# Patient Record
Sex: Female | Born: 1995 | Race: Black or African American | Hispanic: No | Marital: Single | Smoking: Never smoker
Health system: Southern US, Community
[De-identification: ages and names within clinical notes are randomized; demographics above are authoritative.]

## PROBLEM LIST (undated history)

## (undated) DIAGNOSIS — D649 Anemia, unspecified: Secondary | ICD-10-CM

## (undated) DIAGNOSIS — E282 Polycystic ovarian syndrome: Secondary | ICD-10-CM

## (undated) DIAGNOSIS — E559 Vitamin D deficiency, unspecified: Secondary | ICD-10-CM

## (undated) DIAGNOSIS — R7303 Prediabetes: Secondary | ICD-10-CM

## (undated) DIAGNOSIS — F32A Depression, unspecified: Secondary | ICD-10-CM

## (undated) DIAGNOSIS — F329 Major depressive disorder, single episode, unspecified: Secondary | ICD-10-CM

## (undated) DIAGNOSIS — F419 Anxiety disorder, unspecified: Secondary | ICD-10-CM

## (undated) DIAGNOSIS — G4730 Sleep apnea, unspecified: Secondary | ICD-10-CM

## (undated) DIAGNOSIS — R079 Chest pain, unspecified: Secondary | ICD-10-CM

## (undated) DIAGNOSIS — N803 Endometriosis of pelvic peritoneum: Secondary | ICD-10-CM

## (undated) DIAGNOSIS — Z8041 Family history of malignant neoplasm of ovary: Secondary | ICD-10-CM

## (undated) DIAGNOSIS — K5900 Constipation, unspecified: Secondary | ICD-10-CM

## (undated) DIAGNOSIS — IMO0002 Reserved for concepts with insufficient information to code with codable children: Secondary | ICD-10-CM

## (undated) DIAGNOSIS — Z803 Family history of malignant neoplasm of breast: Secondary | ICD-10-CM

## (undated) DIAGNOSIS — Z8042 Family history of malignant neoplasm of prostate: Secondary | ICD-10-CM

## (undated) DIAGNOSIS — L732 Hidradenitis suppurativa: Secondary | ICD-10-CM

## (undated) DIAGNOSIS — Z800 Family history of malignant neoplasm of digestive organs: Secondary | ICD-10-CM

## (undated) HISTORY — DX: Family history of malignant neoplasm of digestive organs: Z80.0

## (undated) HISTORY — DX: Prediabetes: R73.03

## (undated) HISTORY — DX: Anemia, unspecified: D64.9

## (undated) HISTORY — DX: Vitamin D deficiency, unspecified: E55.9

## (undated) HISTORY — DX: Polycystic ovarian syndrome: E28.2

## (undated) HISTORY — DX: Constipation, unspecified: K59.00

## (undated) HISTORY — DX: Family history of malignant neoplasm of ovary: Z80.41

## (undated) HISTORY — DX: Hidradenitis suppurativa: L73.2

## (undated) HISTORY — PX: WISDOM TOOTH EXTRACTION: SHX21

## (undated) HISTORY — PX: NO PAST SURGERIES: SHX2092

## (undated) HISTORY — DX: Chest pain, unspecified: R07.9

## (undated) HISTORY — PX: CERVICAL POLYPECTOMY: SHX88

## (undated) HISTORY — DX: Family history of malignant neoplasm of breast: Z80.3

## (undated) HISTORY — DX: Family history of malignant neoplasm of prostate: Z80.42

---
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
## 2016-02-18 ENCOUNTER — Ambulatory Visit: Payer: BLUE CROSS/BLUE SHIELD | Admitting: Nurse Practitioner

## 2016-03-27 ENCOUNTER — Encounter: Payer: Self-pay | Admitting: Internal Medicine

## 2016-03-27 ENCOUNTER — Ambulatory Visit (INDEPENDENT_AMBULATORY_CARE_PROVIDER_SITE_OTHER): Payer: BLUE CROSS/BLUE SHIELD | Admitting: Internal Medicine

## 2016-03-27 ENCOUNTER — Other Ambulatory Visit (INDEPENDENT_AMBULATORY_CARE_PROVIDER_SITE_OTHER): Payer: BLUE CROSS/BLUE SHIELD

## 2016-03-27 ENCOUNTER — Other Ambulatory Visit: Payer: Self-pay | Admitting: Obstetrics & Gynecology

## 2016-03-27 DIAGNOSIS — N943 Premenstrual tension syndrome: Secondary | ICD-10-CM | POA: Diagnosis not present

## 2016-03-27 DIAGNOSIS — Z23 Encounter for immunization: Secondary | ICD-10-CM | POA: Diagnosis not present

## 2016-03-27 DIAGNOSIS — E669 Obesity, unspecified: Secondary | ICD-10-CM | POA: Diagnosis not present

## 2016-03-27 DIAGNOSIS — R5383 Other fatigue: Secondary | ICD-10-CM | POA: Diagnosis not present

## 2016-03-27 DIAGNOSIS — Z6839 Body mass index (BMI) 39.0-39.9, adult: Secondary | ICD-10-CM

## 2016-03-27 LAB — CBC WITH DIFFERENTIAL/PLATELET
BASOS PCT: 0.6 % (ref 0.0–3.0)
Basophils Absolute: 0 10*3/uL (ref 0.0–0.1)
EOS ABS: 0.3 10*3/uL (ref 0.0–0.7)
Eosinophils Relative: 3.9 % (ref 0.0–5.0)
HEMATOCRIT: 32.8 % — AB (ref 36.0–49.0)
HEMOGLOBIN: 10.8 g/dL — AB (ref 12.0–16.0)
LYMPHS PCT: 25.7 % (ref 24.0–48.0)
Lymphs Abs: 2.2 10*3/uL (ref 0.7–4.0)
MCHC: 32.8 g/dL (ref 31.0–37.0)
MCV: 74.3 fl — AB (ref 78.0–98.0)
MONOS PCT: 8.6 % (ref 3.0–12.0)
Monocytes Absolute: 0.7 10*3/uL (ref 0.1–1.0)
NEUTROS ABS: 5.3 10*3/uL (ref 1.4–7.7)
Neutrophils Relative %: 61.2 % (ref 43.0–71.0)
PLATELETS: 344 10*3/uL (ref 150.0–575.0)
RBC: 4.41 Mil/uL (ref 3.80–5.70)
RDW: 17.2 % — AB (ref 11.4–15.5)
WBC: 8.7 10*3/uL (ref 4.5–13.5)

## 2016-03-27 LAB — BASIC METABOLIC PANEL
BUN: 8 mg/dL (ref 6–23)
CHLORIDE: 105 meq/L (ref 96–112)
CO2: 27 meq/L (ref 19–32)
CREATININE: 0.57 mg/dL (ref 0.40–1.20)
Calcium: 9.5 mg/dL (ref 8.4–10.5)
GFR: 174.51 mL/min (ref >60.00)
Glucose, Bld: 85 mg/dL (ref 70–99)
POTASSIUM: 3.8 meq/L (ref 3.5–5.1)
SODIUM: 138 meq/L (ref 135–145)

## 2016-03-27 LAB — URINALYSIS
Bilirubin Urine: NEGATIVE
Hgb urine dipstick: NEGATIVE
KETONES UR: NEGATIVE
Leukocytes, UA: NEGATIVE
Nitrite: NEGATIVE
SPECIFIC GRAVITY, URINE: 1.01 (ref 1.000–1.030)
TOTAL PROTEIN, URINE-UPE24: NEGATIVE
URINE GLUCOSE: NEGATIVE
UROBILINOGEN UA: 0.2 (ref 0.0–1.0)
pH: 7 (ref 5.0–8.0)

## 2016-03-27 LAB — HEPATIC FUNCTION PANEL
ALBUMIN: 3.9 g/dL (ref 3.5–5.2)
ALK PHOS: 66 U/L (ref 47–119)
ALT: 12 U/L (ref 0–35)
AST: 12 U/L (ref 0–37)
BILIRUBIN DIRECT: 0 mg/dL (ref 0.0–0.3)
TOTAL PROTEIN: 7.3 g/dL (ref 6.0–8.3)
Total Bilirubin: 0.4 mg/dL (ref 0.2–1.2)

## 2016-03-27 LAB — TSH: TSH: 2.57 u[IU]/mL (ref 0.40–5.00)

## 2016-03-27 LAB — VITAMIN B12: Vitamin B-12: 415 pg/mL (ref 211–911)

## 2016-03-27 LAB — MONONUCLEOSIS SCREEN: Mono Screen: NEGATIVE

## 2016-03-27 NOTE — Progress Notes (Signed)
Pre visit review using our clinic review tool, if applicable. No additional management support is needed unless otherwise documented below in the visit note. 

## 2016-03-27 NOTE — Assessment & Plan Note (Signed)
On Zoloft. °

## 2016-03-27 NOTE — Assessment & Plan Note (Addendum)
10/17 ?etiology Labs Increase activity, loose wt, power nap

## 2016-03-27 NOTE — Patient Instructions (Signed)
Take a power nap

## 2016-03-27 NOTE — Progress Notes (Signed)
Subjective:  Patient ID: Kendra Allen, female    DOB: August 04, 1995  Age: 20 y.o. MRN: [id number]  CC: No chief complaint on file.   HPI Kendra Allen presents for fatigue 1 month. She has to take 1-2 naps a day.  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 Periods are not heavy.   Outpatient Medications Prior to Visit  Medication Sig Dispense Refill  . cholecalciferol (VITAMIN D) 1000 units tablet Take 1 tablet (1,000 Units total) by mouth daily. 100 tablet 3  . doxycycline (DORYX) 150 MG EC tablet daily.    . hydrOXYzine (VISTARIL) 25 MG capsule TAKE ONE CAPSULE BY MOUTH EVERY 6 HOURS AS NEEDED FOR ANXIETY  1  . sertraline (ZOLOFT) 100 MG tablet TAKE 1 TABLET BY MOUTH EVERY DAY (Patient taking differently: TAKE 1 1/2 TABLET BY MOUTH EVERY DAY) 90 tablet 1   No facility-administered medications prior to visit.     ROS Review of Systems  Constitutional: Negative for activity change, appetite change, chills, fatigue and unexpected weight change.  HENT: Negative for congestion, mouth sores and sinus pressure.   Eyes: Negative for visual disturbance.  Respiratory: Negative for cough and chest tightness.   Gastrointestinal: Negative for abdominal pain and nausea.  Genitourinary: Negative for difficulty urinating, frequency and vaginal pain.  Musculoskeletal: Negative for back pain and gait problem.  Skin: Negative for pallor and rash.  Neurological: Negative for dizziness, tremors, weakness, numbness and headaches.  Psychiatric/Behavioral: Negative for confusion, sleep disturbance and suicidal ideas.    Objective:  BP 110/70   Pulse 76   Temp 98.8 F (37.1 C) (Oral)   Wt 246 lb (111.6 kg)   SpO2 98%   BMI 39.71 kg/m   BP Readings from Last 3 Encounters:  03/27/16 110/70  11/26/15 110/78  06/16/15 108/74    Wt Readings from Last 3 Encounters:  03/27/16 246 lb (111.6 kg) (>99 %, Z > 2.33)*  11/26/15 242 lb (109.8 kg) (>99 %,  Z > 2.33)*  06/16/15 241 lb (109.3 kg) (>99 %, Z > 2.33)*   * Growth percentiles are based on CDC 2-20 Years data.    Physical Exam  Constitutional: She appears well-developed. No distress.  HENT:  Head: Normocephalic.  Right Ear: External ear normal.  Left Ear: External ear normal.  Nose: Nose normal.  Mouth/Throat: Oropharynx is clear and moist.  Eyes: Conjunctivae are normal. Pupils are equal, round, and reactive to light. Right eye exhibits no discharge. Left eye exhibits no discharge.  Neck: Normal range of motion. Neck supple. No JVD present. No tracheal deviation present. No thyromegaly present.  Cardiovascular: Normal rate, regular rhythm and normal heart sounds.   Pulmonary/Chest: No stridor. No respiratory distress. She has no wheezes.  Abdominal: Soft. Bowel sounds are normal. She exhibits no distension and no mass. There is no tenderness. There is no rebound and no guarding.  Musculoskeletal: She exhibits no edema or tenderness.  Lymphadenopathy:    She has no cervical adenopathy.  Neurological: She displays normal reflexes. No cranial nerve deficit. She exhibits normal muscle tone. Coordination normal.  Skin: No rash noted. No erythema.  Psychiatric: She has a normal mood and affect. Her behavior is normal. Judgment and thought content normal.  Obese  Lab Results  Component Value Date   WBC 7.2 08/25/2014   HGB 11.9 (L) 08/25/2014   HCT 35.3 (L) 08/25/2014   PLT 340.0 08/25/2014   GLUCOSE 86 08/25/2014  ALT 16 08/25/2014   AST 15 08/25/2014   NA 137 08/25/2014   K 4.1 08/25/2014   CL 103 08/25/2014   CREATININE 0.60 08/25/2014   BUN 9 08/25/2014   CO2 30 08/25/2014   TSH 2.28 08/25/2014    Dg Chest 2 View  Result Date: 11/26/2015 CLINICAL DATA:  Mid chest pain and chest pressure sensation for the past 3 weeks; no known cardiopulmonary disease; nonsmoker. EXAM: CHEST  2 VIEW COMPARISON:  None in PACs FINDINGS: The lungs are mildly hypoinflated on the frontal  image but better inflated on the lateral image. The interstitial markings are coarse in the mid and lower lungs. There is no alveolar infiltrate. There is no pleural effusion, pneumothorax, or pneumomediastinum. The heart and pulmonary vascularity are normal. The mediastinum is normal in width. The trachea is midline. The bony thorax exhibits no acute abnormality. IMPRESSION: Mild interstitial prominence may reflect bronchitic change. There is no alveolar pneumonia nor CHF. Electronically Signed   By: David  Martinique M.D.   On: 11/26/2015 14:34    Assessment & Plan:   There are no diagnoses linked to this encounter. I am having Ms. Morocho maintain her sertraline, doxycycline, hydrOXYzine, cholecalciferol, and clindamycin.  Meds ordered this encounter  Medications  . clindamycin (CLEOCIN T) 1 % external solution    Sig: as needed.     Follow-up: No Follow-up on file.  Walker Kehr, MD

## 2016-03-27 NOTE — Assessment & Plan Note (Signed)
Labs

## 2016-03-27 NOTE — Addendum Note (Signed)
Addended by: Cresenciano Lick on: 03/27/2016 03:26 PM   Modules accepted: Orders

## 2016-03-28 ENCOUNTER — Other Ambulatory Visit: Payer: Self-pay | Admitting: Internal Medicine

## 2016-03-28 MED ORDER — FERROUS SULFATE 325 (65 FE) MG PO TABS: 325.0000 mg | ORAL_TABLET | Freq: Every day | ORAL | 6 refills | Status: DC

## 2016-04-07 ENCOUNTER — Other Ambulatory Visit: Payer: Self-pay | Admitting: Obstetrics & Gynecology

## 2016-04-07 DIAGNOSIS — N63 Unspecified lump in unspecified breast: Secondary | ICD-10-CM

## 2016-05-10 ENCOUNTER — Other Ambulatory Visit: Payer: BLUE CROSS/BLUE SHIELD

## 2016-10-25 ENCOUNTER — Ambulatory Visit: Payer: BLUE CROSS/BLUE SHIELD | Admitting: Internal Medicine

## 2016-10-27 ENCOUNTER — Ambulatory Visit (INDEPENDENT_AMBULATORY_CARE_PROVIDER_SITE_OTHER): Payer: BLUE CROSS/BLUE SHIELD | Admitting: Internal Medicine

## 2016-10-27 ENCOUNTER — Encounter: Payer: Self-pay | Admitting: Internal Medicine

## 2016-10-27 DIAGNOSIS — F419 Anxiety disorder, unspecified: Secondary | ICD-10-CM | POA: Diagnosis not present

## 2016-10-27 DIAGNOSIS — Z6839 Body mass index (BMI) 39.0-39.9, adult: Secondary | ICD-10-CM

## 2016-10-27 DIAGNOSIS — F329 Major depressive disorder, single episode, unspecified: Secondary | ICD-10-CM | POA: Diagnosis not present

## 2016-10-27 DIAGNOSIS — E669 Obesity, unspecified: Secondary | ICD-10-CM | POA: Diagnosis not present

## 2016-10-27 DIAGNOSIS — F32A Depression, unspecified: Secondary | ICD-10-CM

## 2016-10-27 NOTE — Assessment & Plan Note (Signed)
Recent labs - OK in 2017 Will ref to Dr Leafy Ro

## 2016-10-27 NOTE — Assessment & Plan Note (Signed)
Better Cont w/Zoloft

## 2016-10-27 NOTE — Progress Notes (Signed)
Subjective:  Patient ID: Kendra Allen, female    DOB: 1995-07-01  Age: 21 y.o. MRN: [id number]  CC: No chief complaint on file.   HPI RYLIN SAEZ presents for depression. C/o wt gain and obesity  Outpatient Medications Prior to Visit  Medication Sig Dispense Refill  . cholecalciferol (VITAMIN D) 1000 units tablet Take 1 tablet (1,000 Units total) by mouth daily. 100 tablet 3  . clindamycin (CLEOCIN T) 1 % external solution as needed.    . ferrous sulfate 325 (65 FE) MG tablet Take 1 tablet (325 mg total) by mouth daily. 30 tablet 6  . hydrOXYzine (VISTARIL) 25 MG capsule TAKE ONE CAPSULE BY MOUTH EVERY 6 HOURS AS NEEDED FOR ANXIETY  1  . sertraline (ZOLOFT) 100 MG tablet TAKE 1 TABLET BY MOUTH EVERY DAY (Patient taking differently: TAKE 1 1/2 TABLET BY MOUTH EVERY DAY) 90 tablet 1   No facility-administered medications prior to visit.     ROS Review of Systems  Constitutional: Positive for unexpected weight change. Negative for activity change, appetite change, chills and fatigue.  HENT: Negative for congestion, mouth sores and sinus pressure.   Eyes: Negative for visual disturbance.  Respiratory: Negative for cough and chest tightness.   Gastrointestinal: Negative for abdominal pain and nausea.  Genitourinary: Negative for difficulty urinating, frequency and vaginal pain.  Musculoskeletal: Negative for back pain and gait problem.  Skin: Negative for pallor and rash.  Neurological: Negative for dizziness, tremors, weakness, numbness and headaches.  Psychiatric/Behavioral: Negative for confusion and sleep disturbance.    Objective:  BP 110/76 (BP Location: Right Arm, Patient Position: Sitting, Cuff Size: Large)   Pulse 82   Temp 98.7 F (37.1 C) (Oral)   Ht 5\' 6"  (1.676 m)   Wt 254 lb 1.3 oz (115.2 kg)   SpO2 99%   BMI 41.01 kg/m   BP Readings from Last 3 Encounters:  10/27/16 110/76  03/27/16 110/70  11/26/15 110/78    Wt Readings from Last 3 Encounters:    10/27/16 254 lb 1.3 oz (115.2 kg)  03/27/16 246 lb (111.6 kg) (>99 %, Z= 2.47)*  11/26/15 242 lb (109.8 kg) (>99 %, Z= 2.42)*   * Growth percentiles are based on CDC 2-20 Years data.    Physical Exam  Constitutional: She appears well-developed. No distress.  HENT:  Head: Normocephalic.  Right Ear: External ear normal.  Left Ear: External ear normal.  Nose: Nose normal.  Mouth/Throat: Oropharynx is clear and moist.  Eyes: Conjunctivae are normal. Pupils are equal, round, and reactive to light. Right eye exhibits no discharge. Left eye exhibits no discharge.  Neck: Normal range of motion. Neck supple. No JVD present. No tracheal deviation present. No thyromegaly present.  Cardiovascular: Normal rate, regular rhythm and normal heart sounds.   Pulmonary/Chest: No stridor. No respiratory distress. She has no wheezes.  Abdominal: Soft. Bowel sounds are normal. She exhibits no distension and no mass. There is no tenderness. There is no rebound and no guarding.  Musculoskeletal: She exhibits no edema or tenderness.  Lymphadenopathy:    She has no cervical adenopathy.  Neurological: She displays normal reflexes. No cranial nerve deficit. She exhibits normal muscle tone. Coordination normal.  Skin: No rash noted. No erythema.  Psychiatric: She has a normal mood and affect. Her behavior is normal. Judgment and thought content normal.  Obese  Lab Results  Component Value Date   WBC 8.7 03/27/2016   HGB 10.8 (L) 03/27/2016   HCT 32.8 (L)  03/27/2016   PLT 344.0 03/27/2016   GLUCOSE 85 03/27/2016   ALT 12 03/27/2016   AST 12 03/27/2016   NA 138 03/27/2016   K 3.8 03/27/2016   CL 105 03/27/2016   CREATININE 0.57 03/27/2016   BUN 8 03/27/2016   CO2 27 03/27/2016   TSH 2.57 03/27/2016    Dg Chest 2 View  Result Date: 11/26/2015 CLINICAL DATA:  Mid chest pain and chest pressure sensation for the past 3 weeks; no known cardiopulmonary disease; nonsmoker. EXAM: CHEST  2 VIEW COMPARISON:   None in PACs FINDINGS: The lungs are mildly hypoinflated on the frontal image but better inflated on the lateral image. The interstitial markings are coarse in the mid and lower lungs. There is no alveolar infiltrate. There is no pleural effusion, pneumothorax, or pneumomediastinum. The heart and pulmonary vascularity are normal. The mediastinum is normal in width. The trachea is midline. The bony thorax exhibits no acute abnormality. IMPRESSION: Mild interstitial prominence may reflect bronchitic change. There is no alveolar pneumonia nor CHF. Electronically Signed   By: David  Martinique M.D.   On: 11/26/2015 14:34    Assessment & Plan:   There are no diagnoses linked to this encounter. I am having Ms. Feider maintain her sertraline, hydrOXYzine, cholecalciferol, clindamycin, and ferrous sulfate.  No orders of the defined types were placed in this encounter.    Follow-up: No Follow-up on file.  Walker Kehr, MD

## 2016-11-07 ENCOUNTER — Telehealth: Payer: Self-pay | Admitting: Internal Medicine

## 2016-11-07 NOTE — Telephone Encounter (Signed)
No, sorry. This is the only one - ?cancellation list Thx

## 2016-11-07 NOTE — Telephone Encounter (Signed)
States patient was referred for weight management.  Soonest patient could get in was July.  Patient was wanting to get in sooner.  Would like to know if there is a facility that can get patient in sooner.     States patient has been to OBGYN twice.  States patient is having a DNC and polyp removed soon.

## 2016-11-07 NOTE — Telephone Encounter (Signed)
Cecille Rubin- Do you know of anywhere where patient might be able to be seen sooner?  [place]-FYI

## 2016-11-09 NOTE — Telephone Encounter (Signed)
LM notifying pt

## 2016-11-15 ENCOUNTER — Other Ambulatory Visit: Payer: Self-pay | Admitting: Obstetrics and Gynecology

## 2016-11-20 ENCOUNTER — Encounter (HOSPITAL_COMMUNITY): Payer: Self-pay

## 2016-11-21 ENCOUNTER — Ambulatory Visit (HOSPITAL_COMMUNITY): Payer: BLUE CROSS/BLUE SHIELD | Admitting: Certified Registered Nurse Anesthetist

## 2016-11-21 ENCOUNTER — Ambulatory Visit (HOSPITAL_COMMUNITY)
Admission: RE | Admit: 2016-11-21 | Discharge: 2016-11-21 | Disposition: A | Discharge status: Home / Self Care | Payer: BLUE CROSS/BLUE SHIELD | Source: Ambulatory Visit | Attending: Obstetrics and Gynecology | Admitting: Obstetrics and Gynecology

## 2016-11-21 ENCOUNTER — Encounter (HOSPITAL_COMMUNITY)
Admission: RE | Disposition: A | Discharge status: Home / Self Care | Payer: Self-pay | Source: Ambulatory Visit | Attending: Obstetrics and Gynecology

## 2016-11-21 ENCOUNTER — Encounter (HOSPITAL_COMMUNITY): Payer: Self-pay | Admitting: *Deleted

## 2016-11-21 DIAGNOSIS — F329 Major depressive disorder, single episode, unspecified: Secondary | ICD-10-CM | POA: Diagnosis not present

## 2016-11-21 DIAGNOSIS — Z882 Allergy status to sulfonamides status: Secondary | ICD-10-CM | POA: Insufficient documentation

## 2016-11-21 DIAGNOSIS — F419 Anxiety disorder, unspecified: Secondary | ICD-10-CM | POA: Insufficient documentation

## 2016-11-21 DIAGNOSIS — N840 Polyp of corpus uteri: Secondary | ICD-10-CM | POA: Insufficient documentation

## 2016-11-21 DIAGNOSIS — N938 Other specified abnormal uterine and vaginal bleeding: Secondary | ICD-10-CM | POA: Insufficient documentation

## 2016-11-21 DIAGNOSIS — Z79899 Other long term (current) drug therapy: Secondary | ICD-10-CM | POA: Insufficient documentation

## 2016-11-21 HISTORY — PX: DILATATION & CURETTAGE/HYSTEROSCOPY WITH MYOSURE: SHX6511

## 2016-11-21 HISTORY — DX: Anxiety disorder, unspecified: F41.9

## 2016-11-21 HISTORY — DX: Depression, unspecified: F32.A

## 2016-11-21 HISTORY — DX: Major depressive disorder, single episode, unspecified: F32.9

## 2016-11-21 LAB — CBC
HEMATOCRIT: 31.9 % — AB (ref 36.0–46.0)
Hemoglobin: 10 g/dL — ABNORMAL LOW (ref 12.0–15.0)
MCH: 22.5 pg — AB (ref 26.0–34.0)
MCHC: 31.3 g/dL (ref 30.0–36.0)
MCV: 71.7 fL — AB (ref 78.0–100.0)
Platelets: 389 10*3/uL (ref 150–400)
RBC: 4.45 MIL/uL (ref 3.87–5.11)
RDW: 16.5 % — AB (ref 11.5–15.5)
WBC: 6.9 10*3/uL (ref 4.0–10.5)

## 2016-11-21 SURGERY — DILATATION & CURETTAGE/HYSTEROSCOPY WITH MYOSURE
Anesthesia: General | Site: Vagina

## 2016-11-21 MED ORDER — DEXAMETHASONE SODIUM PHOSPHATE 4 MG/ML IJ SOLN
INTRAMUSCULAR | Status: AC
  Filled 2016-11-21: qty 1

## 2016-11-21 MED ORDER — FENTANYL CITRATE (PF) 100 MCG/2ML IJ SOLN
INTRAMUSCULAR | Status: DC | PRN
  Administered 2016-11-21 (×2): 50 ug via INTRAVENOUS

## 2016-11-21 MED ORDER — SODIUM CHLORIDE 0.9 % IR SOLN
Status: DC | PRN
  Administered 2016-11-21 (×2): 3000 mL

## 2016-11-21 MED ORDER — KETOROLAC TROMETHAMINE 30 MG/ML IJ SOLN
INTRAMUSCULAR | Status: AC
  Filled 2016-11-21: qty 1

## 2016-11-21 MED ORDER — PROMETHAZINE HCL 25 MG/ML IJ SOLN: 6.2500 mg | INTRAMUSCULAR | Status: DC | PRN

## 2016-11-21 MED ORDER — SERTRALINE HCL 100 MG PO TABS: 100.0000 mg | ORAL_TABLET | Freq: Every day | ORAL | 1 refills | Status: DC

## 2016-11-21 MED ORDER — ONDANSETRON HCL 4 MG/2ML IJ SOLN
INTRAMUSCULAR | Status: DC | PRN
  Administered 2016-11-21: 4 mg via INTRAVENOUS

## 2016-11-21 MED ORDER — SCOPOLAMINE 1 MG/3DAYS TD PT72
1.0000 | MEDICATED_PATCH | Freq: Once | TRANSDERMAL | Status: DC
  Administered 2016-11-21: 1.5 mg via TRANSDERMAL

## 2016-11-21 MED ORDER — HYDROMORPHONE HCL 1 MG/ML IJ SOLN
0.2500 mg | INTRAMUSCULAR | Status: DC | PRN
  Administered 2016-11-21: 0.5 mg via INTRAVENOUS

## 2016-11-21 MED ORDER — SCOPOLAMINE 1 MG/3DAYS TD PT72
MEDICATED_PATCH | TRANSDERMAL | Status: AC
  Administered 2016-11-21: 1.5 mg via TRANSDERMAL
  Filled 2016-11-21: qty 1

## 2016-11-21 MED ORDER — DEXAMETHASONE SODIUM PHOSPHATE 4 MG/ML IJ SOLN
INTRAMUSCULAR | Status: DC | PRN
  Administered 2016-11-21: 4 mg via INTRAVENOUS

## 2016-11-21 MED ORDER — HYDROMORPHONE HCL 1 MG/ML IJ SOLN
INTRAMUSCULAR | Status: AC
  Filled 2016-11-21: qty 1

## 2016-11-21 MED ORDER — FENTANYL CITRATE (PF) 100 MCG/2ML IJ SOLN
INTRAMUSCULAR | Status: AC
  Filled 2016-11-21: qty 2

## 2016-11-21 MED ORDER — PROPOFOL 10 MG/ML IV BOLUS
INTRAVENOUS | Status: DC | PRN
  Administered 2016-11-21: 200 mg via INTRAVENOUS

## 2016-11-21 MED ORDER — MIDAZOLAM HCL 2 MG/2ML IJ SOLN
INTRAMUSCULAR | Status: DC | PRN
  Administered 2016-11-21: 2 mg via INTRAVENOUS

## 2016-11-21 MED ORDER — KETOROLAC TROMETHAMINE 30 MG/ML IJ SOLN
INTRAMUSCULAR | Status: DC | PRN
  Administered 2016-11-21: 30 mg via INTRAMUSCULAR
  Administered 2016-11-21: 30 mg via INTRAVENOUS

## 2016-11-21 MED ORDER — IBUPROFEN 800 MG PO TABS: 800.0000 mg | ORAL_TABLET | Freq: Three times a day (TID) | ORAL | 0 refills | Status: DC | PRN

## 2016-11-21 MED ORDER — SERTRALINE HCL 100 MG PO TABS: 100.0000 mg | ORAL_TABLET | Freq: Every day | ORAL | 0 refills | Status: DC

## 2016-11-21 MED ORDER — ONDANSETRON HCL 4 MG/2ML IJ SOLN
INTRAMUSCULAR | Status: AC
  Filled 2016-11-21: qty 2

## 2016-11-21 MED ORDER — LACTATED RINGERS IV SOLN
INTRAVENOUS | Status: DC
Start: 2016-11-21 — End: 2016-11-21
  Administered 2016-11-21 (×2): via INTRAVENOUS

## 2016-11-21 MED ORDER — PROPOFOL 10 MG/ML IV BOLUS
INTRAVENOUS | Status: AC
  Filled 2016-11-21: qty 20

## 2016-11-21 MED ORDER — LIDOCAINE HCL (CARDIAC) 20 MG/ML IV SOLN
INTRAVENOUS | Status: AC
  Filled 2016-11-21: qty 5

## 2016-11-21 MED ORDER — MIDAZOLAM HCL 2 MG/2ML IJ SOLN
INTRAMUSCULAR | Status: AC
  Filled 2016-11-21: qty 2

## 2016-11-21 MED ORDER — PHENYLEPHRINE HCL 10 MG/ML IJ SOLN
INTRAMUSCULAR | Status: DC | PRN
  Administered 2016-11-21 (×3): 40 ug via INTRAVENOUS
  Administered 2016-11-21: 80 ug via INTRAVENOUS

## 2016-11-21 MED ORDER — LIDOCAINE HCL (CARDIAC) 20 MG/ML IV SOLN
INTRAVENOUS | Status: DC | PRN
  Administered 2016-11-21: 70 mg via INTRAVENOUS

## 2016-11-21 SURGICAL SUPPLY — 19 items
CANISTER SUCT 3000ML PPV (MISCELLANEOUS) ×2 IMPLANT
CATH ROBINSON RED A/P 16FR (CATHETERS) ×2 IMPLANT
CLOTH BEACON ORANGE TIMEOUT ST (SAFETY) ×2 IMPLANT
CONTAINER PREFILL 10% NBF 60ML (FORM) ×4 IMPLANT
DEVICE MYOSURE LITE (MISCELLANEOUS) ×2 IMPLANT
DEVICE MYOSURE REACH (MISCELLANEOUS) IMPLANT
ELECT REM PT RETURN 9FT ADLT (ELECTROSURGICAL)
ELECTRODE REM PT RTRN 9FT ADLT (ELECTROSURGICAL) IMPLANT
FILTER ARTHROSCOPY CONVERTOR (FILTER) ×2 IMPLANT
GLOVE BIOGEL PI IND STRL 7.0 (GLOVE) ×2 IMPLANT
GLOVE BIOGEL PI INDICATOR 7.0 (GLOVE) ×2
GLOVE ECLIPSE 6.5 STRL STRAW (GLOVE) ×2 IMPLANT
GOWN STRL REUS W/TWL LRG LVL3 (GOWN DISPOSABLE) ×4 IMPLANT
PACK VAGINAL MINOR WOMEN LF (CUSTOM PROCEDURE TRAY) ×2 IMPLANT
PAD OB MATERNITY 4.3X12.25 (PERSONAL CARE ITEMS) ×2 IMPLANT
SEAL ROD LENS SCOPE MYOSURE (ABLATOR) ×2 IMPLANT
TOWEL OR 17X24 6PK STRL BLUE (TOWEL DISPOSABLE) ×4 IMPLANT
TUBING AQUILEX INFLOW (TUBING) ×2 IMPLANT
TUBING AQUILEX OUTFLOW (TUBING) ×2 IMPLANT

## 2016-11-21 NOTE — H&P (Signed)
Kendra Allen is an 21 y.o. female.GO BF here for surgical mgmt of DUB with endometrial mas  Pertinent Gynecological History: Menses: DUB Bleeding: dysfunctional uterine bleeding Contraception: none DES exposure: denies Blood transfusions: none Sexually transmitted diseases: no past history Previous GYN Procedures: n/a  Last mammogram: n/a Date: n/a  Last pap: n/a Date: n/a  OB History: G0   Menstrual History: Menarche age: n/a  Patient's last menstrual period was 10/25/2016.    Past Medical History:  Diagnosis Date  . Anxiety   . Depression     Past Surgical History:  Procedure Laterality Date  . NO PAST SURGERIES      Family History  Problem Relation Age of Onset  . Hypertension Mother     Social History:  reports that she has never smoked. She has never used smokeless tobacco. She reports that she does not drink alcohol or use drugs.  Allergies:  Allergies  Allergen Reactions  . Sulfamethoxazole-Trimethoprim     upset stomach    Prescriptions Prior to Admission  Medication Sig Dispense Refill Last Dose  . clindamycin (CLEOCIN T) 1 % external solution Apply 1 application topically as needed (hidradenitis suppurativa flare).    Past Month at Unknown time  . ibuprofen (ADVIL,MOTRIN) 200 MG tablet Take 400-600 mg by mouth daily as needed for headache.   Past Month at Unknown time  . misoprostol (CYTOTEC) 200 MCG tablet Place 200 mcg vaginally once.   11/21/2016 at Unknown time  . sertraline (ZOLOFT) 100 MG tablet TAKE 1 TABLET BY MOUTH EVERY DAY (Patient taking differently: take 150-200mg s by mouth once daily in the evening) 90 tablet 1 11/19/2016 at Unknown time  . hydrOXYzine (VISTARIL) 25 MG capsule Take 25mg s by mouth up to two times daily as needed for anxiety  1 More than a month at Unknown time    Review of Systems  All other systems reviewed and are negative.   Blood pressure 122/75, pulse 88, temperature 97.6 F (36.4 C), temperature source Oral,  height 5\' 6"  (1.676 m), weight 112.9 kg (249 lb), last menstrual period 10/25/2016, SpO2 99 %. Physical Exam  Constitutional: She appears well-developed and well-nourished.  HENT:  Head: Atraumatic.  Eyes: EOM are normal.  Neck: Neck supple.  Cardiovascular: Normal rate.   Respiratory: Breath sounds normal.  GI: Soft.  Genitourinary: Vagina normal and uterus normal.  Musculoskeletal: Normal range of motion.  Skin: Skin is warm and dry.  adnexal neg  Results for orders placed or performed during the hospital encounter of 11/21/16 (from the past 24 hour(s))  CBC     Status: Abnormal   Collection Time: 11/21/16 10:04 AM  Result Value Ref Range   WBC 6.9 4.0 - 10.5 K/uL   RBC 4.45 3.87 - 5.11 MIL/uL   Hemoglobin 10.0 (L) 12.0 - 15.0 g/dL   HCT 31.9 (L) 36.0 - 46.0 %   MCV 71.7 (L) 78.0 - 100.0 fL   MCH 22.5 (L) 26.0 - 34.0 pg   MCHC 31.3 30.0 - 36.0 g/dL   RDW 16.5 (H) 11.5 - 15.5 %   Platelets 389 150 - 400 K/uL    No results found.  Assessment/Plan: DUB Endometrial mass P) dx hysteroscopy, D&C, resection of endometrial mass Risk of surgery includes infection, bleeding, injury to surrounding organ structures, thermal injury, uterine perforation and its risk, fluid overload  Berkley Cronkright A 11/21/2016, 11:24 AM

## 2016-11-21 NOTE — Brief Op Note (Signed)
11/21/2016  12:24 PM  PATIENT:  Kendra Allen  21 y.o. female  PRE-OPERATIVE DIAGNOSIS:  Dysfunctional Uterine Bleeding, Endometrial Mass  POST-OPERATIVE DIAGNOSIS:  Dysfunctional Uterine Bleeding, Endometrial Mass  PROCEDURE:  Diagnostic hysteroscopy, hysteroscopic resection of endometrial polyp, D&C  SURGEON:  Surgeon(s) and Role:    * Servando Salina, MD - Primary  PHYSICIAN ASSISTANT:   ASSISTANTS: none   ANESTHESIA:   general FINDINGS; endometrial thickening and endom polyps, nl endocervical canal EBL:  Total I/O In: 1200 [I.V.:1200] Out: 110 [Urine:100; Blood:10]  BLOOD ADMINISTERED:none  DRAINS: none   LOCAL MEDICATIONS USED:  NONE  SPECIMEN:  Source of Specimen:  emc with polyp  DISPOSITION OF SPECIMEN:  PATHOLOGY  COUNTS:  YES  TOURNIQUET:  * No tourniquets in log *  DICTATION: .Other Dictation: Dictation Number [id number]  PLAN OF CARE: Discharge to home after PACU  PATIENT DISPOSITION:  PACU - hemodynamically stable.   Delay start of Pharmacological VTE agent (>24hrs) due to surgical blood loss or risk of bleeding: no

## 2016-11-21 NOTE — Discharge Instructions (Signed)
CALL  IF TEMP>100.4, NOTHING PER VAGINA X 1WK, CALL IF SOAKING A MAXI  PAD EVERY HOUR OR MORE FREQUENTLY   DISCHARGE INSTRUCTIONS: HYSTEROSCOPY  The following instructions have been prepared to help you care for yourself upon your return home.  May Remove Scop patch on or before 11/24/16  May take Ibuprofen after 6:50pm today    Personal hygiene:  Use sanitary pads for vaginal drainage, not tampons.  Shower the day after your procedure.  NO tub baths, pools or Jacuzzis for 2-3 weeks.  Wipe front to back after using the bathroom.  Activity and limitations:  Do NOT drive or operate any equipment for 24 hours. The effects of anesthesia are still present and drowsiness may result.  Do NOT rest in bed all day.  Walking is encouraged.  Walk up and down stairs slowly.  You may resume your normal activity in one to two days or as indicated by your physician. Sexual activity: NO intercourse for at least 2 weeks after the procedure, or as indicated by your Doctor.  Diet: Eat a light meal as desired this evening. You may resume your usual diet tomorrow.  Return to Work: You may resume your work activities in one to two days or as indicated by Marine scientist.  What to expect after your surgery: Expect to have vaginal bleeding/discharge for 2-3 days and spotting for up to 10 days. It is not unusual to have soreness for up to 1-2 weeks. You may have a slight burning sensation when you urinate for the first day. Mild cramps may continue for a couple of days. You may have a regular period in 2-6 weeks.  Call your doctor for any of the following:  Excessive vaginal bleeding or clotting, saturating and changing one pad every hour.  Inability to urinate 6 hours after discharge from hospital.  Pain not relieved by pain medication.  Fever of 100.4 F or greater.  Unusual vaginal discharge or odor.  Return to office _________________Call for an appointment  ___________________ Patients signature: ______________________ Nurses signature ________________________  [facility] [phone]

## 2016-11-21 NOTE — Anesthesia Postprocedure Evaluation (Signed)
Anesthesia Post Note  Patient: Kendra Allen  Procedure(s) Performed: Procedure(s) (LRB): DILATATION & CURETTAGE/HYSTEROSCOPY WITH MYOSURE (N/A)     Patient location during evaluation: PACU Anesthesia Type: General Level of consciousness: sedated Pain management: pain level controlled Vital Signs Assessment: post-procedure vital signs reviewed and stable Respiratory status: spontaneous breathing and respiratory function stable Cardiovascular status: stable Anesthetic complications: no    Last Vitals:  Vitals:   11/21/16 1230 11/21/16 1245  BP: 124/70 126/73  Pulse: 77 71  Resp: 14 16  Temp:      Last Pain:  Vitals:   11/21/16 1245  TempSrc:   PainSc: 2    Pain Goal: Patients Stated Pain Goal: 4 (11/21/16 1007)               Tristin Vandeusen DANIEL

## 2016-11-21 NOTE — Anesthesia Procedure Notes (Signed)
Procedure Name: LMA Insertion Date/Time: 11/21/2016 11:39 AM Performed by: Raenette Rover Pre-anesthesia Checklist: Patient identified, Emergency Drugs available, Suction available and Patient being monitored Patient Re-evaluated:Patient Re-evaluated prior to inductionOxygen Delivery Method: Circle system utilized Preoxygenation: Pre-oxygenation with 100% oxygen Intubation Type: IV induction LMA: LMA inserted LMA Size: 4.0 Number of attempts: 1 Placement Confirmation: positive ETCO2,  CO2 detector and breath sounds checked- equal and bilateral Tube secured with: Tape Dental Injury: Teeth and Oropharynx as per pre-operative assessment

## 2016-11-21 NOTE — Transfer of Care (Signed)
Immediate Anesthesia Transfer of Care Note  Patient: Kendra Allen  Procedure(s) Performed: Procedure(s): DILATATION & CURETTAGE/HYSTEROSCOPY WITH MYOSURE (N/A)  Patient Location: PACU  Anesthesia Type:General  Level of Consciousness: awake, alert , oriented and patient cooperative  Airway & Oxygen Therapy: Patient Spontanous Breathing and Patient connected to nasal cannula oxygen  Post-op Assessment: Report given to RN and Post -op Vital signs reviewed and stable  Post vital signs: Reviewed and stable  Last Vitals:  Vitals:   11/21/16 1007  BP: 122/75  Pulse: 88  Temp: 36.4 C    Last Pain:  Vitals:   11/21/16 1007  TempSrc: Oral      Patients Stated Pain Goal: 4 (93/55/21 7471)  Complications: No apparent anesthesia complications

## 2016-11-21 NOTE — Anesthesia Preprocedure Evaluation (Signed)
Anesthesia Evaluation  Patient identified by MRN, date of birth, ID band Patient awake    Reviewed: Allergy & Precautions, NPO status , Patient's Chart, lab work & pertinent test results  Airway Mallampati: III  TM Distance: >3 FB Neck ROM: Full    Dental no notable dental hx. (+) Dental Advisory Given   Pulmonary neg pulmonary ROS,    Pulmonary exam normal        Cardiovascular negative cardio ROS Normal cardiovascular exam     Neuro/Psych PSYCHIATRIC DISORDERS Anxiety Depression negative neurological ROS     GI/Hepatic negative GI ROS, Neg liver ROS,   Endo/Other  negative endocrine ROS  Renal/GU negative Renal ROS  negative genitourinary   Musculoskeletal negative musculoskeletal ROS (+)   Abdominal   Peds negative pediatric ROS (+)  Hematology negative hematology ROS (+)   Anesthesia Other Findings   Reproductive/Obstetrics                             Anesthesia Physical Anesthesia Plan  ASA: II  Anesthesia Plan: General   Post-op Pain Management:    Induction: Intravenous  PONV Risk Score and Plan: 3 and Ondansetron, Dexamethasone, Scopolamine patch - Pre-op and Diphenhydramine  Airway Management Planned: LMA  Additional Equipment:   Intra-op Plan:   Post-operative Plan: Extubation in OR  Informed Consent:   Dental advisory given  Plan Discussed with: CRNA and Anesthesiologist  Anesthesia Plan Comments:         Anesthesia Quick Evaluation

## 2016-11-22 ENCOUNTER — Encounter (HOSPITAL_COMMUNITY): Payer: Self-pay | Admitting: Obstetrics and Gynecology

## 2016-11-22 NOTE — Op Note (Addendum)
NAME:  Kendra Allen, Kendra Allen                   ACCOUNT NO.:  MEDICAL RECORD NO.:  [id number]  LOCATION:                                 FACILITY:  PHYSICIAN:  Servando Salina, M.D.    DATE OF BIRTH:  DATE OF PROCEDURE:  11/21/2016 DATE OF DISCHARGE:                              OPERATIVE REPORT   PREOPERATIVE DIAGNOSIS:  Dysfunctional uterine bleeding, endometrial mass.  PROCEDURES:  Diagnostic hysteroscopy, hysteroscopic resection of endometrial polyp, dilation and curettage.  POSTOPERATIVE DIAGNOSIS:  Dysfunctional uterine bleeding, endometrial mass.  ANESTHESIA:  General.  SURGEON:  Servando Salina, M.D.  ASSISTANT:  None.  DESCRIPTION OF PROCEDURE:  Under adequate general anesthesia, the patient was placed in dorsal lithotomy position.  She was sterilely prepped and draped in usual fashion.  The bladder was catheterized for moderate amount of urine.  Examination under anesthesia revealed anteverted uterus.  No adnexal masses could be appreciated.  A bivalve speculum was placed in the vagina.  Single-tooth tenaculum was placed on the anterior lip of the cervix.  The cervix easily accepted up to [facility] dilator.  A hysteroscope was introduced into the uterine cavity. Diffuse endometrial thickening as well as polypoid lesions were noted, particularly in the lower uterine segment.  Using the Lite MyoSure resectoscope, the cavity and the polyps were resected.  When all polypoid lesions were noted to be removed, the hysteroscope was then removed.  The cavity was then gently curetted for a small amount of tissue.  All instruments were then removed from the vagina.  SPECIMEN:  Labeled endometrial curetting with polyps sent to Pathology.  ESTIMATED BLOOD LOSS:  Minimal.  COMPLICATIONS:  None.  The patient tolerated the procedure well, was transferred to the recovery room in stable condition.     Servando Salina, M.D.    [place]/MEDQ  D:  11/21/2016  T:  11/21/2016   Job:  [id number]

## 2017-11-02 ENCOUNTER — Ambulatory Visit: Payer: BLUE CROSS/BLUE SHIELD | Admitting: Psychology

## 2017-11-02 DIAGNOSIS — F330 Major depressive disorder, recurrent, mild: Secondary | ICD-10-CM

## 2017-11-08 ENCOUNTER — Encounter: Payer: Self-pay | Admitting: Family

## 2017-11-08 ENCOUNTER — Ambulatory Visit (INDEPENDENT_AMBULATORY_CARE_PROVIDER_SITE_OTHER): Payer: BLUE CROSS/BLUE SHIELD | Admitting: Family

## 2017-11-08 VITALS — BP 128/76 | HR 86 | Temp 98.7°F | Ht 66.0 in | Wt 273.1 lb

## 2017-11-08 DIAGNOSIS — E669 Obesity, unspecified: Secondary | ICD-10-CM | POA: Diagnosis not present

## 2017-11-08 DIAGNOSIS — L732 Hidradenitis suppurativa: Secondary | ICD-10-CM

## 2017-11-08 DIAGNOSIS — Z0289 Encounter for other administrative examinations: Secondary | ICD-10-CM

## 2017-11-08 DIAGNOSIS — Z6839 Body mass index (BMI) 39.0-39.9, adult: Secondary | ICD-10-CM

## 2017-11-08 DIAGNOSIS — N926 Irregular menstruation, unspecified: Secondary | ICD-10-CM

## 2017-11-08 NOTE — Progress Notes (Signed)
Kendra Allen is a 22 y.o. female with the following history as recorded in EpicCare:  Patient Active Problem List   Diagnosis Date Noted  . Midsternal chest pain 11/26/2015  . Acute upper respiratory infection 06/16/2015  . Pain in joint, ankle and foot 03/30/2015  . Apathy 03/26/2014  . Headache 03/26/2014  . PMS (premenstrual syndrome) 01/26/2014  . Anxiety and depression 01/26/2014  . Fever and chills 09/10/2013  . Fatigue 09/10/2013  . Anxiety attack 06/09/2013  . Unspecified constipation 04/02/2013  . Diarrhea 04/02/2013  . Allergic rhinitis 02/01/2012  . Otitis media 12/12/2011  . Obesity 03/30/2010  . Hidradenitis 12/02/2009    Current Outpatient Medications  Medication Sig Dispense Refill  . clindamycin (CLEOCIN T) 1 % external solution Apply 1 application topically as needed (hidradenitis suppurativa flare).     [name] doxycycline (DORYX) 150 MG EC tablet     . hydrOXYzine (VISTARIL) 25 MG capsule Take 25mg s by mouth up to two times daily as needed for anxiety  1  . sertraline (ZOLOFT) 100 MG tablet Take 1 tablet (100 mg total) by mouth daily. 30 tablet 0   No current facility-administered medications for this visit.     Allergies: Sulfamethoxazole-trimethoprim  Past Medical History:  Diagnosis Date  . Anxiety   . Depression     Past Surgical History:  Procedure Laterality Date  . DILATATION & CURETTAGE/HYSTEROSCOPY WITH MYOSURE N/A 11/21/2016   Procedure: DILATATION & CURETTAGE/HYSTEROSCOPY WITH MYOSURE;  [name], MD;  Location: [facility];  Service: Gynecology;  [name];  . NO PAST SURGERIES      Family History  Problem Relation Age of Onset  . Hypertension Mother     Social History   Tobacco Use  . Smoking status: Never Smoker  . Smokeless tobacco: Never Used  Substance Use Topics  . 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  Head: Normocephalic and atraumatic  Eyes: Sclera and conjunctiva clear; pupils round and reactive to light; extraocular movements intact  Ears: External normal; canals clear; tympanic membranes normal  Oropharynx: Pink, supple. No suspicious lesions  Neck: Supple without thyromegaly, adenopathy  Lungs: Respirations unlabored; clear to auscultation bilaterally without wheeze, rales, rhonchi  CVS exam: normal rate and regular rhythm.  Neurologic: Alert and oriented; speech intact; face symmetrical; moves all extremities well; CNII-XII intact without focal deficit   Assessment:  1. Hidradenitis   2. Class 2 obesity without serious comorbidity with body mass index (BMI) of 39.0 to 39.9 in adult, unspecified obesity type   3. Irregular periods   4. Physical exam for camp     Plan:  1. Continue to work with dermatology as scheduled; 2. Continue to work on weight loss goals; 3. She is encouraged  to let her GYN know that she has not had period since early March; may need Provera; she denies any chance of being pregnant. 4. Cleared for participation in camp with no limitations.    No follow-ups on file.  No orders of the defined types were placed in this encounter.   Requested Prescriptions    No prescriptions requested or ordered in this encounter

## 2017-11-16 ENCOUNTER — Ambulatory Visit: Payer: BLUE CROSS/BLUE SHIELD | Admitting: Psychology

## 2017-11-30 ENCOUNTER — Ambulatory Visit: Payer: BLUE CROSS/BLUE SHIELD | Admitting: Psychology

## 2017-12-21 ENCOUNTER — Other Ambulatory Visit: Payer: BLUE CROSS/BLUE SHIELD

## 2017-12-22 ENCOUNTER — Emergency Department (HOSPITAL_COMMUNITY)
Admission: EM | Admit: 2017-12-22 | Discharge: 2017-12-22 | Discharge status: Absent without leave | Payer: BLUE CROSS/BLUE SHIELD

## 2017-12-22 ENCOUNTER — Other Ambulatory Visit (HOSPITAL_COMMUNITY)
Admission: RE | Admit: 2017-12-22 | Discharge: 2017-12-22 | Disposition: A | Discharge status: Home / Self Care | Payer: BLUE CROSS/BLUE SHIELD | Source: Ambulatory Visit | Attending: Physician Assistant | Admitting: Physician Assistant

## 2017-12-22 DIAGNOSIS — F3341 Major depressive disorder, recurrent, in partial remission: Secondary | ICD-10-CM | POA: Diagnosis not present

## 2017-12-22 DIAGNOSIS — F419 Anxiety disorder, unspecified: Secondary | ICD-10-CM | POA: Insufficient documentation

## 2017-12-22 LAB — LIPID PANEL
CHOL/HDL RATIO: 3.7 ratio
CHOLESTEROL: 152 mg/dL (ref 0–200)
HDL: 41 mg/dL (ref >40)
LDL Cholesterol: 84 mg/dL (ref 0–99)
Triglycerides: 134 mg/dL (ref <150)
VLDL: 27 mg/dL (ref 0–40)

## 2017-12-22 LAB — COMPREHENSIVE METABOLIC PANEL
ALBUMIN: 4 g/dL (ref 3.5–5.0)
ALK PHOS: 65 U/L (ref 38–126)
ALT: 18 U/L (ref 0–44)
ANION GAP: 8 (ref 5–15)
AST: 18 U/L (ref 15–41)
BUN: 10 mg/dL (ref 6–20)
CALCIUM: 9.5 mg/dL (ref 8.9–10.3)
CO2: 27 mmol/L (ref 22–32)
Chloride: 107 mmol/L (ref 98–111)
Creatinine, Ser: 0.54 mg/dL (ref 0.44–1.00)
GFR calc non Af Amer: >60 mL/min (ref >60)
GLUCOSE: 85 mg/dL (ref 70–99)
Potassium: 4.1 mmol/L (ref 3.5–5.1)
SODIUM: 142 mmol/L (ref 135–145)
Total Bilirubin: 0.4 mg/dL (ref 0.3–1.2)
Total Protein: 7.8 g/dL (ref 6.5–8.1)

## 2017-12-22 LAB — CBC WITH DIFFERENTIAL/PLATELET
BASOS PCT: 0 %
Basophils Absolute: 0 10*3/uL (ref 0.0–0.1)
EOS ABS: 0.2 10*3/uL (ref 0.0–0.7)
EOS PCT: 3 %
HCT: 35.2 % — ABNORMAL LOW (ref 36.0–46.0)
HEMOGLOBIN: 11.1 g/dL — AB (ref 12.0–15.0)
Lymphocytes Relative: 36 %
Lymphs Abs: 2.5 10*3/uL (ref 0.7–4.0)
MCH: 24.6 pg — ABNORMAL LOW (ref 26.0–34.0)
MCHC: 31.5 g/dL (ref 30.0–36.0)
MCV: 78 fL (ref 78.0–100.0)
Monocytes Absolute: 0.6 10*3/uL (ref 0.1–1.0)
Monocytes Relative: 9 %
NEUTROS PCT: 52 %
Neutro Abs: 3.7 10*3/uL (ref 1.7–7.7)
Platelets: 350 10*3/uL (ref 150–400)
RBC: 4.51 MIL/uL (ref 3.87–5.11)
RDW: 16.6 % — ABNORMAL HIGH (ref 11.5–15.5)
WBC: 7.1 10*3/uL (ref 4.0–10.5)

## 2017-12-22 LAB — HEMOGLOBIN A1C
Hgb A1c MFr Bld: 5.5 % (ref 4.8–5.6)
MEAN PLASMA GLUCOSE: 111.15 mg/dL

## 2017-12-25 ENCOUNTER — Ambulatory Visit: Payer: BLUE CROSS/BLUE SHIELD | Admitting: Internal Medicine

## 2018-01-08 ENCOUNTER — Encounter (INDEPENDENT_AMBULATORY_CARE_PROVIDER_SITE_OTHER): Payer: Self-pay

## 2018-01-21 ENCOUNTER — Ambulatory Visit: Payer: BLUE CROSS/BLUE SHIELD | Admitting: Internal Medicine

## 2018-01-21 ENCOUNTER — Encounter: Payer: Self-pay | Admitting: Internal Medicine

## 2018-01-21 VITALS — BP 124/72 | HR 71 | Temp 98.0°F | Ht 66.0 in | Wt 266.0 lb

## 2018-01-21 DIAGNOSIS — F410 Panic disorder [episodic paroxysmal anxiety] without agoraphobia: Secondary | ICD-10-CM | POA: Diagnosis not present

## 2018-01-21 DIAGNOSIS — E669 Obesity, unspecified: Secondary | ICD-10-CM

## 2018-01-21 DIAGNOSIS — D649 Anemia, unspecified: Secondary | ICD-10-CM | POA: Insufficient documentation

## 2018-01-21 DIAGNOSIS — Z6839 Body mass index (BMI) 39.0-39.9, adult: Secondary | ICD-10-CM

## 2018-01-21 DIAGNOSIS — R7309 Other abnormal glucose: Secondary | ICD-10-CM

## 2018-01-21 DIAGNOSIS — D509 Iron deficiency anemia, unspecified: Secondary | ICD-10-CM | POA: Diagnosis not present

## 2018-01-21 MED ORDER — FERROUS SULFATE 325 (65 FE) MG PO TABS: 325.0000 mg | ORAL_TABLET | Freq: Every day | ORAL | 6 refills | Status: DC

## 2018-01-21 MED ORDER — FERROUS SULFATE 325 (65 FE) MG PO TABS: 325.0000 mg | ORAL_TABLET | Freq: Every day | ORAL | 0 refills | Status: DC

## 2018-01-21 NOTE — Progress Notes (Signed)
Subjective:  Patient ID: Kendra Allen, female    DOB: 03/23/1996  Age: 22 y.o. MRN: [id number]  CC: No chief complaint on file.   HPI Kendra Allen presents for anemia, depression f/u. 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 clindamycin (CLEOCIN T) 1 % external solution Apply 1 application topically as needed (hidradenitis suppurativa flare).     [name] doxycycline (DORYX) 150 MG EC tablet     . hydrOXYzine (VISTARIL) 25 MG capsule Take 25mg s by mouth up to two times daily as needed for anxiety  1  . REXULTI 1 MG TABS Take 1 tablet by mouth every morning.  1  . sertraline (ZOLOFT) 100 MG tablet Take 1 tablet (100 mg total) by mouth daily. 30 tablet 0  . TRI-LO-MARZIA 0.18/0.215/0.25 MG-25 MCG tab Take 1 tablet by mouth daily.  2   No facility-administered medications prior to visit.     ROS: Review of Systems  Constitutional: Negative for activity change, appetite change, chills, fatigue and unexpected weight change.  HENT: Negative for congestion, mouth sores and sinus pressure.   Eyes: Negative for visual disturbance.  Respiratory: Negative for cough and chest tightness.   Gastrointestinal: Negative for abdominal pain and nausea.  Genitourinary: Negative for difficulty urinating, frequency and vaginal pain.  Musculoskeletal: Negative for back pain and gait problem.  Skin: Negative for pallor and rash.  Neurological: Negative for dizziness, tremors, weakness, numbness and headaches.  Psychiatric/Behavioral: Negative for confusion and sleep disturbance.    Objective:  BP 124/72 (BP Location: Right Arm, Patient Position: Sitting, Cuff Size: Large)   Pulse 71   Temp 98 F (36.7 C) (Oral)   Ht 5\' 6"  (1.676 m)   Wt 266 lb (120.7 kg)   SpO2 97%   BMI 42.93 kg/m   BP Readings from Last 3 Encounters:  01/21/18 124/72    11/08/17 128/76  11/21/16 120/66    Wt Readings from Last 3 Encounters:  01/21/18 266 lb (120.7 kg)  11/08/17 273 lb 1.3 oz (123.9 kg)  11/21/16 249 lb (112.9 kg)    Physical Exam  Constitutional: She appears well-developed. No distress.  HENT:  Head: Normocephalic.  Right Ear: External ear normal.  Left Ear: External ear normal.  Nose: Nose normal.  Mouth/Throat: Oropharynx is clear and moist.  Eyes: Pupils are equal, round, and reactive to light. Conjunctivae are normal. Right eye exhibits no discharge. Left eye exhibits no discharge.  Neck: Normal range of motion. Neck supple. No JVD present. No tracheal deviation present. No thyromegaly present.  Cardiovascular: Normal rate, regular rhythm and normal heart sounds.  Pulmonary/Chest: No stridor. No respiratory distress. She has no wheezes.  Abdominal: Soft. Bowel sounds are normal. She exhibits no distension and no mass. There is no tenderness. There is no rebound and no guarding.  Musculoskeletal: She exhibits no edema or tenderness.  Lymphadenopathy:    She has no cervical adenopathy.  Neurological: She displays normal reflexes. No cranial nerve deficit. She exhibits normal muscle tone. Coordination normal.  Skin: No rash noted. No erythema.  Psychiatric: She has a normal mood and affect. Her behavior is normal. Judgment and thought content normal.  obese  Lab Results  Component Value Date   WBC 7.1 12/22/2017   HGB 11.1 (L) 12/22/2017   HCT 35.2 (L) 12/22/2017   PLT 350 12/22/2017  GLUCOSE 85 12/22/2017   CHOL 152 12/22/2017   TRIG 134 12/22/2017   HDL 41 12/22/2017   LDLCALC 84 12/22/2017   ALT 18 12/22/2017   AST 18 12/22/2017   NA 142 12/22/2017   K 4.1 12/22/2017   CL 107 12/22/2017   CREATININE 0.54 12/22/2017   BUN 10 12/22/2017   CO2 27 12/22/2017   TSH 2.57 03/27/2016   HGBA1C 5.5 12/22/2017    No results found.  Assessment & Plan:   There are no diagnoses linked to this encounter.   No  orders of the defined types were placed in this encounter.    Follow-up: No follow-ups on file.  Walker Kehr, MD

## 2018-01-21 NOTE — Assessment & Plan Note (Signed)
Start PO iron BCP

## 2018-01-21 NOTE — Assessment & Plan Note (Signed)
Kendra Clever, PA Rexulti Zoloft

## 2018-01-21 NOTE — Assessment & Plan Note (Signed)
Wt Readings from Last 3 Encounters:  01/21/18 266 lb (120.7 kg)  11/08/17 273 lb 1.3 oz (123.9 kg)  11/21/16 249 lb (112.9 kg)

## 2018-04-09 ENCOUNTER — Telehealth: Payer: Self-pay | Admitting: Internal Medicine

## 2018-04-09 NOTE — Telephone Encounter (Signed)
Copied from [place] [id number]. Topic: General - Other >> Apr 09, 2018 11:35 AM [name], NT wrote: Reason for CRM: Patient would like an order  b12  please call her at  [phone]

## 2018-04-10 NOTE — Telephone Encounter (Signed)
I have left patient yet again a VM.  If mom calls back we do need to explain to her why we can not speak with her and ask her daughter to give [place] a call back.   Thank You.

## 2018-04-10 NOTE — Telephone Encounter (Signed)
Patient's mother calling to check the status of getting patient set up for b12 injections. Unable to relay message below to mother due to her not being on the [facility].

## 2018-04-10 NOTE — Telephone Encounter (Signed)
Last B12 level was checked in 2017.

## 2018-04-10 NOTE — Telephone Encounter (Signed)
Left patient vm to get more information in regard.  Patient does not have a standing order for b12 or has had recent b12 checked.  I need to know if another office has discovered this or if patient is having symptoms that makes her believe she has low b12.  [place] may want OV or may be able to add orders without OV.

## 2018-04-10 NOTE — Telephone Encounter (Signed)
b12 was normal then, if patient has new concerns she will need an appointment. Please call to schedule

## 2018-04-10 NOTE — Telephone Encounter (Signed)
If unable to reach pt call dad lewis Carchi  at [phone]. Pt will be avail at 4 pm on friday

## 2018-04-17 ENCOUNTER — Encounter: Payer: Self-pay | Admitting: Internal Medicine

## 2018-04-17 ENCOUNTER — Encounter

## 2018-04-17 ENCOUNTER — Other Ambulatory Visit (INDEPENDENT_AMBULATORY_CARE_PROVIDER_SITE_OTHER): Payer: BLUE CROSS/BLUE SHIELD

## 2018-04-17 ENCOUNTER — Ambulatory Visit: Payer: BLUE CROSS/BLUE SHIELD | Admitting: Internal Medicine

## 2018-04-17 ENCOUNTER — Ambulatory Visit: Payer: Self-pay | Admitting: Internal Medicine

## 2018-04-17 VITALS — BP 114/72 | HR 78 | Temp 98.7°F | Resp 16 | Ht 66.0 in | Wt 275.0 lb

## 2018-04-17 DIAGNOSIS — F419 Anxiety disorder, unspecified: Secondary | ICD-10-CM | POA: Diagnosis not present

## 2018-04-17 DIAGNOSIS — F329 Major depressive disorder, single episode, unspecified: Secondary | ICD-10-CM | POA: Diagnosis not present

## 2018-04-17 DIAGNOSIS — R5383 Other fatigue: Secondary | ICD-10-CM | POA: Diagnosis not present

## 2018-04-17 DIAGNOSIS — D509 Iron deficiency anemia, unspecified: Secondary | ICD-10-CM

## 2018-04-17 DIAGNOSIS — R7309 Other abnormal glucose: Secondary | ICD-10-CM

## 2018-04-17 DIAGNOSIS — F32A Depression, unspecified: Secondary | ICD-10-CM

## 2018-04-17 LAB — CBC WITH DIFFERENTIAL/PLATELET
BASOS ABS: 0.1 10*3/uL (ref 0.0–0.1)
Basophils Relative: 0.6 % (ref 0.0–3.0)
EOS ABS: 0.2 10*3/uL (ref 0.0–0.7)
Eosinophils Relative: 2.8 % (ref 0.0–5.0)
HEMATOCRIT: 35.2 % — AB (ref 36.0–46.0)
HEMOGLOBIN: 11.5 g/dL — AB (ref 12.0–15.0)
LYMPHS PCT: 28.4 % (ref 12.0–46.0)
Lymphs Abs: 2.3 10*3/uL (ref 0.7–4.0)
MCHC: 32.5 g/dL (ref 30.0–36.0)
MCV: 76.8 fl — ABNORMAL LOW (ref 78.0–100.0)
MONO ABS: 0.6 10*3/uL (ref 0.1–1.0)
Monocytes Relative: 7.3 % (ref 3.0–12.0)
Neutro Abs: 5 10*3/uL (ref 1.4–7.7)
Neutrophils Relative %: 60.9 % (ref 43.0–77.0)
Platelets: 315 10*3/uL (ref 150.0–400.0)
RBC: 4.59 Mil/uL (ref 3.87–5.11)
RDW: 17.8 % — ABNORMAL HIGH (ref 11.5–15.5)
WBC: 8.1 10*3/uL (ref 4.0–10.5)

## 2018-04-17 LAB — BASIC METABOLIC PANEL
BUN: 7 mg/dL (ref 6–23)
CHLORIDE: 104 meq/L (ref 96–112)
CO2: 27 meq/L (ref 19–32)
Calcium: 9.5 mg/dL (ref 8.4–10.5)
Creatinine, Ser: 0.56 mg/dL (ref 0.40–1.20)
GFR: 174.54 mL/min (ref >60.00)
GLUCOSE: 90 mg/dL (ref 70–99)
POTASSIUM: 3.7 meq/L (ref 3.5–5.1)
SODIUM: 139 meq/L (ref 135–145)

## 2018-04-17 LAB — VITAMIN B12: VITAMIN B 12: 389 pg/mL (ref 211–911)

## 2018-04-17 LAB — IRON,TIBC AND FERRITIN PANEL
%SAT: 8 % — AB (ref 16–45)
Ferritin: 34 ng/mL (ref 16–154)
Iron: 34 ug/dL — ABNORMAL LOW (ref 40–190)
TIBC: 429 ug/dL (ref 250–450)

## 2018-04-17 LAB — HEMOGLOBIN A1C: HEMOGLOBIN A1C: 5.6 % (ref 4.6–6.5)

## 2018-04-17 MED ORDER — BREXPIPRAZOLE 2 MG PO TABS: 2.0000 mg | ORAL_TABLET | Freq: Every morning | ORAL | 1 refills | Status: DC

## 2018-04-17 NOTE — Assessment & Plan Note (Signed)
Following with Kendra Allen [facility]-psychiatry and has an appointment in December No concerns regarding anxiety, but increased depression-she cannot wait to have a change made until December Continue sertraline 200 mg daily Increase Rexulti 2 mg daily She has decreased her work hours, which will help We also discussed stopping the birth control which has made the depression worse-I have recommended stopping it for now and following up with her gynecologist for other options Continue to see the therapist at school

## 2018-04-17 NOTE — Patient Instructions (Signed)
The Rexulti was increased to 2 mg.   Follow up with your psychiatrist.

## 2018-04-17 NOTE — Progress Notes (Signed)
Subjective:    Patient ID: Kendra Allen, female    DOB: 06-27-1995, 22 y.o.   MRN: [id number]  HPI The patient is here for an acute visit.  She is here with her mother.  Her depression has worsened over the past couple of months.  Part of the depression started with going back to school.  She is in her senior year.  She wants to maintain good grades and is working.  She has been working too much and when she gets back to school her hours will be decreased.  In the past month she also started on birth control pills because she has not been having her menses regularly.  The birth control also seemed to worsen her depression.  She denies any major anxiety, just depression.  She denies any suicidal ideation.  She does follow with a psychiatric nurse practitioner and has an appointment in December.  She is not able to get in any sooner.  She did start therapy at school, but she just started it and it has not helped much.  She cannot wait until December to make a change and really needs something different now.  She sleeps excessively-she sleeps whenever she can.  She is also eating excessively.  She has occasional palpitations and increased headaches.   Alcide Clever - pscyh.     Medications and allergies reviewed with patient and updated if appropriate.  Patient Active Problem List   Diagnosis Date Noted  . Anemia 01/21/2018  . Midsternal chest pain 11/26/2015  . Acute upper respiratory infection 06/16/2015  . Pain in joint, ankle and foot 03/30/2015  . Apathy 03/26/2014  . Headache 03/26/2014  . PMS (premenstrual syndrome) 01/26/2014  . Anxiety and depression 01/26/2014  . Fever and chills 09/10/2013  . Fatigue 09/10/2013  . Anxiety attack 06/09/2013  . Unspecified constipation 04/02/2013  . Diarrhea 04/02/2013  . Allergic rhinitis 02/01/2012  . Otitis media 12/12/2011  . Obesity 03/30/2010  . Hidradenitis 12/02/2009    Current Outpatient Medications on File Prior to Visit    Medication Sig Dispense Refill  . clindamycin (CLEOCIN T) 1 % external solution Apply 1 application topically as needed (hidradenitis suppurativa flare).     . ferrous sulfate 325 (65 FE) MG tablet Take 1 tablet (325 mg total) by mouth daily. 90 tablet 0  . hydrOXYzine (VISTARIL) 25 MG capsule Take 25mg s by mouth up to two times daily as needed for anxiety  1  . REXULTI 1 MG TABS Take 1 tablet by mouth every morning.  1  . sertraline (ZOLOFT) 100 MG tablet Take 1 tablet (100 mg total) by mouth daily. (Patient taking differently: Take 100 mg by mouth 2 (two) times daily. ) 30 tablet 0  . TRI-LO-MARZIA 0.18/0.215/0.25 MG-25 MCG tab Take 1 tablet by mouth daily.  2   No current facility-administered medications on file prior to visit.     Past Medical History:  Diagnosis Date  . Anxiety   . Depression     Past Surgical History:  Procedure Laterality Date  . DILATATION & CURETTAGE/HYSTEROSCOPY WITH MYOSURE N/A 11/21/2016   Procedure: DILATATION & CURETTAGE/HYSTEROSCOPY WITH MYOSURE;  [name], MD;  Location: [facility];  Service: Gynecology;  [name];  . NO PAST SURGERIES      Social History   Socioeconomic History  . Marital status: Single    Spouse name: Not on file  . Number of children: Not on file  . Years of education: Not  on file  . Highest education level: Not on file  Occupational History  . Not on file  Social Needs  . Financial resource strain: Not on file  . Food insecurity:    Worry: Not on file    Inability: Not on file  . Transportation needs:    Medical: Not on file    Non-medical: Not on file  Tobacco Use  . Smoking status: Never Smoker  . Smokeless tobacco: Never Used  Substance and Sexual Activity  . Alcohol use: No    Alcohol/week: 0.0 standard drinks  . Drug use: No  . Sexual activity: Never  Lifestyle  . Physical activity:    Days per week: Not on file    Minutes per session: Not on file  . Stress: Not on file   Relationships  . Social connections:    Talks on phone: Not on file    Gets together: Not on file    Attends religious service: Not on file    Active member of club or organization: Not on file    Attends meetings of clubs or organizations: Not on file    Relationship status: Not on file  Other Topics Concern  . Not on file  Social History Narrative  . Not on file    Family History  Problem Relation Age of Onset  . Hypertension Mother     Review of Systems  Constitutional: Positive for appetite change.  Cardiovascular: Positive for palpitations. Negative for chest pain.  Neurological: Positive for headaches (intermittent).  Psychiatric/Behavioral: Positive for dysphoric mood and sleep disturbance. Negative for suicidal ideas. The patient is nervous/anxious.        Objective:   Vitals:   04/17/18 1305  BP: 114/72  Pulse: 78  Resp: 16  Temp: 98.7 F (37.1 C)  SpO2: 97%   BP Readings from Last 3 Encounters:  04/17/18 114/72  01/21/18 124/72  11/08/17 128/76   Wt Readings from Last 3 Encounters:  04/17/18 275 lb (124.7 kg)  01/21/18 266 lb (120.7 kg)  11/08/17 273 lb 1.3 oz (123.9 kg)   Body mass index is 44.39 kg/m.   Physical Exam  Constitutional: She appears well-developed and well-nourished. No distress.  HENT:  Head: Normocephalic and atraumatic.  Cardiovascular: Normal rate and regular rhythm.  Pulmonary/Chest: Effort normal and breath sounds normal.  Skin: Skin is warm and dry. She is not diaphoretic.  Psychiatric: Her behavior is normal. Judgment and thought content normal.  Depressed affect and mood           Assessment & Plan:    See Problem List for Assessment and Plan of chronic medical problems.

## 2018-04-19 ENCOUNTER — Other Ambulatory Visit: Payer: Self-pay | Admitting: Internal Medicine

## 2018-04-19 MED ORDER — FERROUS SULFATE 325 (65 FE) MG PO TABS: 325.0000 mg | ORAL_TABLET | Freq: Two times a day (BID) | ORAL | 1 refills | Status: DC

## 2018-05-30 ENCOUNTER — Other Ambulatory Visit: Payer: Self-pay | Admitting: Internal Medicine

## 2018-05-31 ENCOUNTER — Ambulatory Visit: Payer: BLUE CROSS/BLUE SHIELD | Admitting: Psychology

## 2018-06-21 ENCOUNTER — Telehealth: Payer: Self-pay | Admitting: Internal Medicine

## 2018-06-21 NOTE — Telephone Encounter (Signed)
Copied from [place] [id number]. Topic: Quick Communication - See Telephone Encounter >> Jun 21, 2018  4:52 PM Blase Mess A wrote: CRM for notification. See Telephone encounter for: 06/21/18.   Patient is calling to request her immunization record Patient can pick up on Monday 06/24/18 9:30am Please advise [phone]

## 2018-06-24 NOTE — Telephone Encounter (Signed)
Printed and up front

## 2018-12-11 ENCOUNTER — Ambulatory Visit (INDEPENDENT_AMBULATORY_CARE_PROVIDER_SITE_OTHER): Payer: BC Managed Care – PPO | Admitting: Family Medicine

## 2018-12-11 ENCOUNTER — Encounter (INDEPENDENT_AMBULATORY_CARE_PROVIDER_SITE_OTHER): Payer: Self-pay | Admitting: Family Medicine

## 2018-12-11 ENCOUNTER — Other Ambulatory Visit: Payer: Self-pay

## 2018-12-11 VITALS — BP 99/65 | HR 84 | Temp 98.5°F | Ht 66.0 in | Wt 271.0 lb

## 2018-12-11 DIAGNOSIS — Z0289 Encounter for other administrative examinations: Secondary | ICD-10-CM

## 2018-12-11 DIAGNOSIS — E282 Polycystic ovarian syndrome: Secondary | ICD-10-CM | POA: Diagnosis not present

## 2018-12-11 DIAGNOSIS — F418 Other specified anxiety disorders: Secondary | ICD-10-CM

## 2018-12-11 DIAGNOSIS — R0602 Shortness of breath: Secondary | ICD-10-CM

## 2018-12-11 DIAGNOSIS — Z9189 Other specified personal risk factors, not elsewhere classified: Secondary | ICD-10-CM | POA: Diagnosis not present

## 2018-12-11 DIAGNOSIS — R5383 Other fatigue: Secondary | ICD-10-CM | POA: Diagnosis not present

## 2018-12-11 DIAGNOSIS — E559 Vitamin D deficiency, unspecified: Secondary | ICD-10-CM

## 2018-12-11 DIAGNOSIS — Z6841 Body Mass Index (BMI) 40.0 and over, adult: Secondary | ICD-10-CM

## 2018-12-12 LAB — VITAMIN D 25 HYDROXY (VIT D DEFICIENCY, FRACTURES): Vit D, 25-Hydroxy: 13 ng/mL — ABNORMAL LOW (ref 30.0–100.0)

## 2018-12-12 LAB — LIPID PANEL WITH LDL/HDL RATIO
Cholesterol, Total: 155 mg/dL (ref 100–199)
HDL: 46 mg/dL (ref >39)
LDL Calculated: 91 mg/dL (ref 0–99)
LDl/HDL Ratio: 2 ratio (ref 0.0–3.2)
Triglycerides: 89 mg/dL (ref 0–149)
VLDL Cholesterol Cal: 18 mg/dL (ref 5–40)

## 2018-12-12 LAB — CBC WITH DIFFERENTIAL
Basophils Absolute: 0 10*3/uL (ref 0.0–0.2)
Basos: 1 %
EOS (ABSOLUTE): 0.2 10*3/uL (ref 0.0–0.4)
Eos: 2 %
Hematocrit: 35.6 % (ref 34.0–46.6)
Hemoglobin: 11.8 g/dL (ref 11.1–15.9)
Immature Grans (Abs): 0 10*3/uL (ref 0.0–0.1)
Immature Granulocytes: 0 %
Lymphocytes Absolute: 2.4 10*3/uL (ref 0.7–3.1)
Lymphs: 29 %
MCH: 26.5 pg — ABNORMAL LOW (ref 26.6–33.0)
MCHC: 33.1 g/dL (ref 31.5–35.7)
MCV: 80 fL (ref 79–97)
Monocytes Absolute: 0.6 10*3/uL (ref 0.1–0.9)
Monocytes: 7 %
Neutrophils Absolute: 5 10*3/uL (ref 1.4–7.0)
Neutrophils: 61 %
RBC: 4.46 x10E6/uL (ref 3.77–5.28)
RDW: 14.8 % (ref 11.7–15.4)
WBC: 8.3 10*3/uL (ref 3.4–10.8)

## 2018-12-12 LAB — COMPREHENSIVE METABOLIC PANEL
ALT: 13 IU/L (ref 0–32)
AST: 12 IU/L (ref 0–40)
Albumin/Globulin Ratio: 1.4 (ref 1.2–2.2)
Albumin: 4.2 g/dL (ref 3.9–5.0)
Alkaline Phosphatase: 79 IU/L (ref 39–117)
BUN/Creatinine Ratio: 14 (ref 9–23)
BUN: 8 mg/dL (ref 6–20)
Bilirubin Total: 0.4 mg/dL (ref 0.0–1.2)
CO2: 23 mmol/L (ref 20–29)
Calcium: 9.5 mg/dL (ref 8.7–10.2)
Chloride: 105 mmol/L (ref 96–106)
Creatinine, Ser: 0.57 mg/dL (ref 0.57–1.00)
GFR calc Af Amer: 152 mL/min/{1.73_m2} (ref >59)
GFR calc non Af Amer: 132 mL/min/{1.73_m2} (ref >59)
Globulin, Total: 3.1 g/dL (ref 1.5–4.5)
Glucose: 87 mg/dL (ref 65–99)
Potassium: 4.3 mmol/L (ref 3.5–5.2)
Sodium: 142 mmol/L (ref 134–144)
Total Protein: 7.3 g/dL (ref 6.0–8.5)

## 2018-12-12 LAB — HEMOGLOBIN A1C
Est. average glucose Bld gHb Est-mCnc: 117 mg/dL
Hgb A1c MFr Bld: 5.7 % — ABNORMAL HIGH (ref 4.8–5.6)

## 2018-12-12 LAB — VITAMIN B12: Vitamin B-12: 473 pg/mL (ref 232–1245)

## 2018-12-12 LAB — INSULIN, RANDOM: INSULIN: 51.6 u[IU]/mL — ABNORMAL HIGH (ref 2.6–24.9)

## 2018-12-12 LAB — T4, FREE: Free T4: 1.35 ng/dL (ref 0.82–1.77)

## 2018-12-12 LAB — FOLATE: Folate: 8.8 ng/mL (ref >3.0)

## 2018-12-12 LAB — T3: T3, Total: 141 ng/dL (ref 71–180)

## 2018-12-12 LAB — TSH: TSH: 2.93 u[IU]/mL (ref 0.450–4.500)

## 2018-12-15 NOTE — Progress Notes (Signed)
Office: [phone]  /  Fax: [phone]   Dear Dr. Garwin Allen,   Thank you for referring Kendra Allen to our clinic. The following note includes my evaluation and treatment recommendations.  HPI:   Chief Complaint: OBESITY    Kendra Allen has been referred by Kendra Salina, MD for consultation regarding her obesity and obesity related comorbidities.    Kendra Allen (MR# [id number]) is a 23 y.o. female who presents on 12/11/2018 for obesity evaluation and treatment. Current BMI is Body mass index is 43.74 kg/m. Kendra Allen has been struggling with her weight for many years and has been unsuccessful in either losing weight, maintaining weight loss, or reaching her healthy weight goal.     Kendra Allen states in the morning she is doing Cap[employer]n [employer]unch 2.5 cups with milk, or fast food breakfast, or McDonald's burger, medium french fries, and medium soft drink (feels full); or +/- 4 mozzarella sticks or 8 piece nugget (feels full for 3-4 hours). No snacks. For dinner at home, she is doing spaghetti or chicken 4.5 oz, potatoes 1/2 cup, and broccoli about 3/4 cup, and she may or may not feel full. She is eating +/- sweets such as a slice of cake or cookies.     Kendra Allen attended our information session and states she is currently in the action stage of change and ready to dedicate time achieving and maintaining a healthier weight. Kendra Allen is interested in becoming our patient and working on intensive lifestyle modifications including (but not limited to) diet, exercise and weight loss.    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 This has worsened with weight gain and has not worsened recently. Kendra Allen admits to daytime somnolence and  admits to waking up still tired. Patient is at risk for obstructive sleep apnea. Patent has a history of symptoms of daytime Kendra. Patient generally gets 8 hours of sleep per night, and states they generally have generally restful sleep. Snoring is present. Apneic episodes are not present. Epworth Sleepiness Score is 13.  Dyspnea on exertion Kendra Allen notes increasing shortness of breath with exercising and seems to be worsening over time with weight gain. She notes getting out of breath sooner with activity than she used to. This has not gotten worse recently. EKG-normal sinus rhythm at 80 BPM. Kendra Allen denies orthopnea.  Depression with Anxiety Kendra Allen has a diagnosis of depression with anxiety. She is on Zoloft 200 mg PO daily and sees a Kendra Allen. She shows no sign of suicidal or homicidal ideations.  Vitamin D Deficiency Kendra Allen has a diagnosis of vitamin D deficiency. She is not on OTC Vit D replacement. She notes Kendra denies nausea, vomiting or muscle weakness.  Polycystic Ovarian Syndrome Kendra Allen has a diagnosis of PCOS. She has irregular periods and she is on metformin (just started by her doctor).  At risk for diabetes Kendra Allen is at higher than average risk for developing diabetes due to her obesity  and PCOS. She currently denies polyuria or polydipsia.  Depression Screen Kendra Allen's Food and Mood (modified PHQ-9) score was  Depression screen PHQ 2/9 12/11/2018  Decreased Interest 2  Down, Depressed, Hopeless 1  PHQ - 2 Score 3  Altered sleeping 3  Tired, decreased energy 3  Change in appetite 2  Feeling bad or failure about yourself  1  Trouble concentrating 0   Moving slowly or fidgety/restless 0  Suicidal thoughts 0  PHQ-9 Score 12  Difficult doing work/chores Somewhat difficult    ASSESSMENT AND PLAN:  Other Kendra - Plan: EKG 12-Lead, Vitamin B12, CBC With Differential, Comprehensive metabolic panel, Folate, Hemoglobin A1c, Insulin, random, T3, T4, free, TSH  Shortness of breath on exertion - Plan: Lipid Panel With LDL/HDL Ratio  Vitamin D deficiency - Plan: VITAMIN D 25 Hydroxy (Vit-D Deficiency, Fractures)  PCOS (polycystic ovarian syndrome)  Depression with anxiety  At risk for diabetes mellitus  Class 3 severe obesity with serious comorbidity and body mass index (BMI) of 40.0 to 44.9 in adult, unspecified obesity type (HCC)  PLAN:  Kendra Kendra Allen was informed that her Kendra may be related to obesity, depression or many other causes. Labs will be ordered, and in the meanwhile Kendra Allen has agreed to work on diet, exercise and weight loss to help with Kendra. Proper sleep hygiene was discussed including the need for 7-8 hours of quality sleep each night. A sleep study was not ordered based on symptoms and Epworth score.  Dyspnea on exertion Kendra Allen's shortness of breath appears to be obesity related and exercise induced. She has agreed to work on weight loss and gradually increase exercise to treat her exercise induced shortness of breath. If Kendra Allen follows our instructions and loses weight without improvement of her shortness of breath, we will plan to refer to pulmonology. We will monitor this condition regularly. Kendra Allen agrees to this plan.  Depression with Anxiety We discussed behavior modification techniques today to help Kendra Allen deal with her depression and anxiety. Kendra Allen is to continue her medications and follow up with Psychiatry. Kendra Allen agrees to follow up with our clinic in 2 weeks.  Vitamin D Deficiency Kendra Allen was informed that low vitamin D levels contributes to Kendra and are associated with obesity,  breast, and colon cancer. She will follow up for routine testing of vitamin D, at least 2-3 times per year. She was informed of the risk of over-replacement of vitamin D and agrees to not increase her dose unless she discusses this with [place] first. We will check Vit D level today. Kendra Allen agrees to follow up with our clinic in 2 weeks.  Polycystic Ovarian Syndrome We will check Hgb A1c and insulin level today. Kendra Allen agrees to follow up with our clinic in 2 weeks.  Diabetes risk counseling Alazay was given extended (15 minutes) diabetes prevention counseling today. She is 23 y.o. female and has risk factors for diabetes including obesity and PCOS. We discussed intensive lifestyle modifications today with an emphasis on weight loss as well as increasing exercise and decreasing simple carbohydrates in her diet.  Depression Screen Mariaeduarda had a moderately positive depression screening. Depression is commonly associated with obesity and often results in emotional eating behaviors. We will monitor this closely and work on CBT to help improve the non-hunger eating patterns. Referral to Psychology may be required if no improvement is seen as she continues in our clinic.  Obesity Korie is currently in the action stage of change and her goal is to continue with weight loss  efforts. I recommend Madalee begin the structured treatment plan as follows:  She has agreed to follow the Category 3 plan Quintasia has been instructed to eventually work up to a goal of 150 minutes of combined cardio and strengthening exercise per week for weight loss and overall health benefits. We discussed the following Behavioral Modification Strategies today: increasing lean protein intake, increasing vegetables, work on meal planning and easy cooking plans, ways to avoid boredom eating, and planning for success   She was informed of the importance of frequent follow up visits to maximize her success with intensive lifestyle  modifications for her multiple health conditions. She was informed we would discuss her lab results at her next visit unless there is a critical issue that needs to be addressed sooner. Reginae agreed to keep her next visit at the agreed upon time to discuss these results.  ALLERGIES: Allergies  Allergen Reactions  . Sulfamethoxazole-Trimethoprim     upset stomach    MEDICATIONS: Current Outpatient Medications on File Prior to Visit  Medication Sig Dispense Refill  . clindamycin (CLEOCIN T) 1 % external solution Apply 1 application topically as needed (hidradenitis suppurativa flare).     [name] doxycycline (DORYX) 150 MG EC tablet Take 150 mg by mouth daily.    [name] doxycycline (VIBRAMYCIN) 50 MG capsule 1 TABLET EVERY OTHER DAY ORALLY 30 DAYS    . hydrOXYzine (VISTARIL) 25 MG capsule Take 25mg s by mouth up to two times daily as needed for anxiety  1  . metFORMIN (GLUCOPHAGE-XR) 500 MG 24 hr tablet TAKE 1 TABLET BY MOUTH EVERY DAY IN THE EVENING WITH FOOD    . sertraline (ZOLOFT) 100 MG tablet Take 1 tablet (100 mg total) by mouth daily. (Patient taking differently: Take 100 mg by mouth 2 (two) times daily. ) 30 tablet 0   No current facility-administered medications on file prior to visit.     PAST MEDICAL HISTORY: Past Medical History:  Diagnosis Date  . Anemia   . Anxiety   . Chest pain   . Constipation   . Depression   . Hidradenitis suppurativa   . PCOS (polycystic ovarian syndrome)   . Vitamin D deficiency     PAST SURGICAL HISTORY: Past Surgical History:  Procedure Laterality Date  . CERVICAL POLYPECTOMY    . DILATATION & CURETTAGE/HYSTEROSCOPY WITH MYOSURE N/A 11/21/2016   Procedure: DILATATION & CURETTAGE/HYSTEROSCOPY WITH MYOSURE;  Surgeon: Kendra Salina, MD;  Location: [facility];  Service: Gynecology;  [name];  . NO PAST SURGERIES      SOCIAL HISTORY: Social History   Tobacco Use  . Smoking status: Never Smoker  . Smokeless tobacco: Never Used   Substance Use Topics  . Alcohol use: No    Alcohol/week: 0.0 standard drinks  . Drug use: No    FAMILY HISTORY: Family History  Problem Relation Age of Onset  . Hypertension Mother   . Depression Mother   . Obesity Mother   . Sleep apnea Father   . Obesity Father     ROS: Review of Systems  Constitutional: Positive for malaise/Kendra. Negative for weight loss.  Eyes:       +Wear glasses or contacts  Respiratory: Positive for shortness of breath (with exertion).   Cardiovascular: Negative for orthopnea.  Gastrointestinal: Negative for nausea and vomiting.  Genitourinary: Negative for frequency.  Musculoskeletal:       Negative muscle weakness  Endo/Heme/Allergies: Negative for polydipsia.  Psychiatric/Behavioral: Positive for depression. Negative for suicidal ideas.       +  Anxiety + Stress    PHYSICAL EXAM: Blood pressure 99/65, pulse 84, temperature 98.5 F (36.9 C), height 5\' 6"  (1.676 m), weight 271 lb (122.9 kg), last menstrual period 12/06/2018, SpO2 96 %. Body mass index is 43.74 kg/m. Physical Exam Vitals signs reviewed.  Constitutional:      Appearance: Normal appearance. She is obese.  HENT:     Head: Normocephalic and atraumatic.     Nose: Nose normal.  Eyes:     General: No scleral icterus.    Extraocular Movements: Extraocular movements intact.  Neck:     Musculoskeletal: Normal range of motion and neck supple.     Comments: No thyromegaly present Cardiovascular:     Rate and Rhythm: Normal rate and regular rhythm.     Pulses: Normal pulses.     Heart sounds: Normal heart sounds.  Pulmonary:     Effort: Pulmonary effort is normal. No respiratory distress.     Breath sounds: Normal breath sounds.  Abdominal:     Palpations: Abdomen is soft.     Tenderness: There is no abdominal tenderness.     Comments: + Obesity  Musculoskeletal: Normal range of motion.     Right lower leg: No edema.     Left lower leg: No edema.  Skin:    General: Skin  is warm and dry.  Neurological:     Mental Status: She is alert and oriented to person, place, and time.     Coordination: Coordination normal.  Psychiatric:        Mood and Affect: Mood normal.        Behavior: Behavior normal.     RECENT LABS AND TESTS: BMET    Component Value Date/Time   NA 142 12/11/2018 0939   K 4.3 12/11/2018 0939   CL 105 12/11/2018 0939   CO2 23 12/11/2018 0939   GLUCOSE 87 12/11/2018 0939   GLUCOSE 90 04/17/2018 1341   BUN 8 12/11/2018 0939   CREATININE 0.57 12/11/2018 0939   CALCIUM 9.5 12/11/2018 0939   GFRNONAA 132 12/11/2018 0939   GFRAA 152 12/11/2018 0939   Lab Results  Component Value Date   HGBA1C 5.7 (H) 12/11/2018   Lab Results  Component Value Date   INSULIN 51.6 (H) 12/11/2018   CBC    Component Value Date/Time   WBC 8.3 12/11/2018 0939   WBC 8.1 04/17/2018 1341   RBC 4.46 12/11/2018 0939   RBC 4.59 04/17/2018 1341   HGB 11.8 12/11/2018 0939   HCT 35.6 12/11/2018 0939   PLT 315.0 04/17/2018 1341   MCV 80 12/11/2018 0939   MCH 26.5 (L) 12/11/2018 0939   MCH 24.6 (L) 12/22/2017 1330   MCHC 33.1 12/11/2018 0939   MCHC 32.5 04/17/2018 1341   RDW 14.8 12/11/2018 0939   LYMPHSABS 2.4 12/11/2018 0939   MONOABS 0.6 04/17/2018 1341   EOSABS 0.2 12/11/2018 0939   BASOSABS 0.0 12/11/2018 0939   Iron/TIBC/Ferritin/ %Sat    Component Value Date/Time   IRON 34 (L) 04/17/2018 1341   TIBC 429 04/17/2018 1341   FERRITIN 34 04/17/2018 1341   IRONPCTSAT 8 (L) 04/17/2018 1341   Lipid Panel     Component Value Date/Time   CHOL 155 12/11/2018 0939   TRIG 89 12/11/2018 0939   HDL 46 12/11/2018 0939   CHOLHDL 3.7 12/22/2017 1330   VLDL 27 12/22/2017 1330   LDLCALC 91 12/11/2018 0939   Hepatic Function Panel     Component Value Date/Time   PROT  7.3 12/11/2018 0939   ALBUMIN 4.2 12/11/2018 0939   AST 12 12/11/2018 0939   ALT 13 12/11/2018 0939   ALKPHOS 79 12/11/2018 0939   BILITOT 0.4 12/11/2018 0939   BILIDIR 0.0  03/27/2016 1455      Component Value Date/Time   TSH 2.930 12/11/2018 0939   TSH 2.57 03/27/2016 1455   TSH 2.28 08/25/2014 1057    ECG  shows NSR with a rate of 80 BPM INDIRECT CALORIMETER done today shows a VO2 of 266 and a REE of 1849.  Her calculated basal metabolic rate is 6962 thus her basal metabolic rate is worse than expected.       OBESITY BEHAVIORAL INTERVENTION VISIT  Today's visit was # 1   Starting weight: 271 lbs Starting date: 12/11/2018 Today's weight : 271 lbs Today's date: 12/11/2018 Total lbs lost to date: 0    ASK: We discussed the diagnosis of obesity with Kendra Allen today and Avilene agreed to give [place] permission to discuss obesity behavioral modification therapy today.  ASSESS: Hodan has the diagnosis of obesity and her BMI today is 43.76 Ariane is in the action stage of change   ADVISE: Eriyonna was educated on the multiple health risks of obesity as well as the benefit of weight loss to improve her health. She was advised of the need for long term treatment and the importance of lifestyle modifications to improve her current health and to decrease her risk of future health problems.  AGREE: Multiple dietary modification options and treatment options were discussed and  Shomari agreed to follow the recommendations documented in the above note.  ARRANGE: Jeneal was educated on the importance of frequent visits to treat obesity as outlined per CMS and USPSTF guidelines and agreed to schedule her next follow up appointment today.  I, Trixie Dredge, am acting as transcriptionist for Ilene Qua, MD   I have reviewed the above documentation for accuracy and completeness, and I agree with the above. - Ilene Qua, MD

## 2018-12-18 ENCOUNTER — Ambulatory Visit (INDEPENDENT_AMBULATORY_CARE_PROVIDER_SITE_OTHER): Payer: BC Managed Care – PPO | Admitting: Psychology

## 2018-12-18 ENCOUNTER — Other Ambulatory Visit: Payer: Self-pay

## 2018-12-18 DIAGNOSIS — F3289 Other specified depressive episodes: Secondary | ICD-10-CM

## 2018-12-18 NOTE — Progress Notes (Signed)
Office: [phone]  /  Fax: [phone]    Date: December 18, 2018    Appointment Start Time: 3:02pm Duration: 48 minutes Provider: Glennie Isle, Psy.D. Type of Session: Intake for Individual Therapy  Location of Patient: Home Location of Provider: Provider's Home Type of Contact: Telepsychological Visit via [employer] Web[employer]  Informed Consent: Prior to proceeding with today's appointment, two pieces of identifying information were obtained from Kendra Allen to verify identity. In addition, Kendra Allen's physical location at the time of this appointment was obtained. Kendra Allen reported she was at home and provided the address. In the event of technical difficulties, Kendra Allen shared a phone number she could be reached at. Kendra Allen and this provider participated in today's telepsychological service. Also, [name] denied anyone else being present in the room or on the WebEx appointment.   The provider's role was explained to Kendra Allen. The provider reviewed and discussed issues of confidentiality, privacy, and limits therein (e.g., reporting obligations). In addition to verbal informed consent, written informed consent for psychological services was obtained from [place] prior to the initial intake interview. Written consent included information concerning the practice, financial arrangements, and confidentiality and patients' rights. Since the clinic is not a 24/7 crisis center, mental health emergency resources were shared, and the provider explained MyChart, e-mail, voicemail, and/or other messaging systems should be utilized only for non-emergency reasons. This provider also explained that information obtained during appointments will be placed in [name]'s medical record in a confidential manner and relevant information will be shared with other providers at [facility] that she meets with for coordination of care. Kendra Allen verbally acknowledged understanding of the aforementioned, and  agreed to use mental health emergency resources discussed if needed. Moreover, Kendra Allen agreed information may be shared with other [facility] providers as needed for coordination of care. By signing the service agreement document, Kendra Allen provided written consent for coordination of care.   Prior to initiating telepsychological services, Kendra Allen was provided with an informed consent document, which included the development of a safety plan (i.e., an emergency contact and emergency resources) in the event of an emergency/crisis. Kendra Allen expressed understanding of the rationale of the safety plan and provided consent for this provider to reach out to her emergency contact in the event of an emergency/crisis. Kendra Allen returned the completed consent form prior to today's appointment. This provider verbally reviewed the consent form during today's appointment prior to proceeding with the appointment. Kendra Allen verbally acknowledged understanding that she is ultimately responsible for understanding her insurance benefits as it relates to reimbursement of telepsychological and in-person services. This provider also reviewed confidentiality, as it relates to telepsychological services, as well as the rationale for telepsychological services. More specifically, this provider's clinic is limiting in-person visits due to COVID-19. Therapeutic services will resume to in-person appointments once deemed appropriate. [name] expressed understanding regarding the rationale for telepsychological services. In addition, this provider explained the telepsychological services informed consent document would be considered an addendum to the initial consent document/service agreement. Kendra Allen verbally consented to proceed.   Chief Complaint/HPI: Kendra Allen was referred by Dr. Ilene Qua. During the initial appointment with Dr. Ilene Qua at [facility] on December 11, 2018, Kendra Allen reported  experiencing the following: significant food cravings issues , frequently drinking liquids with calories, frequently making poor food choices, frequently eating larger portions than normal , struggling with emotional eating, skipping meals frequently and having problems with excessive hunger. The note further indicated, "Kendra Allen has a diagnosis of depression with anxiety.  She is on Zoloft 200 mg PO daily and sees a Teacher, music. She shows no sign of suicidal or homicidal ideations."  During today's appointment, Kendra Allen reported she just graduated from college, and "things have not gone as planned," which has contributed to emotional eating. Kendra Allen was verbally administered a questionnaire assessing various behaviors related to emotional eating. Kendra Allen endorsed the following: overeat when you are celebrating, experience food cravings on a regular basis, eat certain foods when you are anxious, stressed, depressed, or your feelings are hurt, use food to help you cope with emotional situations, find food is comforting to you, overeat when you are angry or upset, overeat when you are worried about something, overeat frequently when you are bored or lonely, overeat when you are alone, but eat much less when you are with other people, eat to help you stay awake and eat as a reward. Kendra Allen shared the onset of emotional eating was likely in college due to an increase in stress and life changes. She described the frequency of emotional eating as "twice a month." She denied a history of binge eating and she also denied mindless eating. She reported she craves sweets, such as chocolate and cake, as well as fried foods. Kendra Allen denied a history of restricting food intake, purging and engagement in other compensatory strategies, and has never been diagnosed with an eating disorder. She also denied a history of treatment for emotional eating. Moreover, Kendra Allen indicated sadness and stress triggers emotional eating,  whereas managing stress makes emotional eating better. Furthermore, Kendra Allen noted, "Sometimes I deal with depression and anxiety, but it has gotten better over the past year."   Mental Status Examination:  Appearance: neat Behavior: cooperative Mood: euthymic Affect: mood congruent Speech: normal in rate, volume, and tone Eye Contact: appropriate Psychomotor Activity: appropriate Thought Process: linear, logical, and goal directed  Content/Perceptual Disturbances: denies suicidal and homicidal ideation, plan, and intent and no hallucinations, delusions, bizarre thinking or behavior reported or observed Orientation: time, person, place and purpose of appointment Cognition/Sensorium: memory, attention, language, and fund of knowledge intact  Insight: good Judgment: good  Family & Psychosocial History: Kendra Allen reported she is not in a relationship and does not have any children. She indicated she is currently employed at [employer] as a Scientist, water quality. 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 Additionally, Kendra Allen shared her highest level of education obtained is a bachelor's degree. Currently, Kendra Allen's social support system consists of her parents, and close friends. Moreover, Kendra Allen stated she resides with her parents.   Medical History:  Past Medical History:  Diagnosis Date   Anemia    Anxiety    Chest pain    Constipation    Depression    Hidradenitis suppurativa    PCOS (polycystic ovarian syndrome)    Vitamin D deficiency    Past Surgical History:  Procedure Laterality Date   CERVICAL POLYPECTOMY     DILATATION & CURETTAGE/HYSTEROSCOPY WITH MYOSURE N/A 11/21/2016   Procedure: DILATATION & CURETTAGE/HYSTEROSCOPY WITH MYOSURE;  [name], MD;  Location: [facility];  Service: Gynecology;  [name];   NO PAST SURGERIES     Current Outpatient Medications on File Prior to Visit  Medication Sig Dispense Refill   clindamycin  (CLEOCIN T) 1 % external solution Apply 1 application topically as needed (hidradenitis suppurativa flare).      doxycycline (DORYX) 150 MG EC tablet Take 150 mg by mouth daily.     doxycycline (VIBRAMYCIN) 50 MG capsule 1 TABLET  EVERY OTHER DAY ORALLY 30 DAYS     hydrOXYzine (VISTARIL) 25 MG capsule Take 25mg s by mouth up to two times daily as needed for anxiety  1   metFORMIN (GLUCOPHAGE-XR) 500 MG 24 hr tablet TAKE 1 TABLET BY MOUTH EVERY DAY IN THE EVENING WITH FOOD     sertraline (ZOLOFT) 100 MG tablet Take 1 tablet (100 mg total) by mouth daily. (Patient taking differently: Take 100 mg by mouth 2 (two) times daily. ) 30 tablet 0   No current facility-administered medications on file prior to visit.   Kadedra denied a history of head injuries and loss of consciousness.   Mental Health History: Kendra Allen endorsed a history of mental health treatment, including therapeutic services. She first attended individual therapy around 2014 for depression and anxiety. She noted she tried meeting with different therapists. She also recalled she attended an evaluation appointment at [facility]. Kendra Allen explained she then continued to meet with Charmin ("a child psychologist"). She has attended therapy "off and on" since then with different providers. Currently, Aleksa is not receiving therapeutic services. Rakayla noted a history of meeting with psychiatrists, and her current psychiatrist is Dr. Jobie Quaker. Dr. Ysidro Evert currently prescribes Vistaril and Zoloft, which she described as helpful. She noted she has "tried different medicines" in the past. Niyanna noted her next appointment with Dr. Ysidro Evert is likely in August and their last appointment was in "March of February." Vonzella denied a history of hospitalizations for psychiatric concerns. Alene endorsed a family history of mental health related concerns. She noted her mother is diagnosed with depression and her older sister is diagnosed with bipolar  disorder. Adisynn denied a trauma history, including psychological, physical  and sexual abuse, as well as neglect.   Anna-Marie described her typical mood as "neutral." Aside from concerns noted above and endorsed on the PHQ-9 and GAD-7, Shaelin reported experiencing worry thoughts about relationships and her weight. Araseli denied current alcohol use. She denied tobacco use. She denied illicit substance use. Regarding caffeine intake, Cecelia reported previously drinking regular soda. Furthermore, Kaidynce denied experiencing the following: hopelessness, obsessions and compulsions, hallucinations and delusions, paranoia, mania and decreased motivation. She also denied history of and current suicidal ideation, plan, and intent; history of and current homicidal ideation, plan, and intent; and history of and current engagement in self-harm. Notably, chart review revealed a history of panic attacks in 2015. She explained her last one was likely last year.   The following strengths were reported by Trent: dependable and responsible. The following strengths were observed by this provider: ability to express thoughts and feelings during the therapeutic session, ability to establish and benefit from a therapeutic relationship, ability to learn and practice coping skills, willingness to work toward established goal(s) with the clinic and ability to engage in reciprocal conversation.  Legal History: Argelia denied a history of legal involvement.   Structured Assessment Results: The Patient Health Questionnaire-9 (PHQ-9) is a self-report measure that assesses symptoms and severity of depression over the course of the last two weeks. Giavanna obtained a score of 5 suggesting mild depression. Wai finds the endorsed symptoms to be somewhat difficult. Little interest or pleasure in doing things 0  Feeling down, depressed, or hopeless 1  Trouble falling or staying asleep, or sleeping too much 0  Feeling  tired or having little energy 1  Poor appetite or overeating 1  Feeling bad about yourself --- or that you are a failure or have let yourself or your family down 2  Trouble concentrating on things, such as reading the newspaper or watching television 0  Moving or speaking so slowly that other people could have noticed? Or the opposite --- being so fidgety or restless that you have been moving around a lot more than usual 0  Thoughts that you would be better off dead or hurting yourself in some way 0  PHQ-9 Score 5    The Generalized Anxiety Disorder-7 (GAD-7) is a brief self-report measure that assesses symptoms of anxiety over the course of the last two weeks. Voncile obtained a score of 2 suggesting minimal anxiety. Dorine finds the endorsed symptoms to be somewhat difficult. Feeling nervous, anxious, on edge 0  Not being able to stop or control worrying 1  Worrying too much about different things 1  Trouble relaxing 0  Being so restless that it's hard to sit still 0  Becoming easily annoyed or irritable 0  Feeling afraid as if something awful might happen 0  GAD-7 Score 2   Interventions: A chart review was conducted prior to the clinical intake interview. The PHQ-9, and GAD-7 were verbally administered as well as a Mood and Food questionnaire to assess various behaviors related to emotional eating. Throughout session, empathic reflections and validation was provided. Continuing treatment with this provider was discussed and a treatment goal was established. Psychoeducation regarding emotional versus physical hunger was provided. Calise was sent a handout via e-mail to utilize between now and the next appointment to increase awareness of hunger patterns and subsequent eating. Jaira provided verbal consent during today's appointment for this provider to send the handout via e-mail.  Provisional DSM-5 Diagnosis: 311 (F32.8) Other Specified Depressive Disorder, Emotional Eating  Behaviors  Plan: Blu appears able and willing to participate as evidenced by collaboration on a treatment goal, engagement in reciprocal conversation, and asking questions as needed for clarification. Due the upcoming holiday and this provider being out of the office, the next appointment will be scheduled in three weeks, which will be via [employer]. The following treatment goal was established: decrease emotional eating. Once this provider's office resumes in-person appointments and it is deemed appropriate, Carmalita will be notified. For the aforementioned goal, Junelle can benefit from biweekly individual therapy sessions that are brief in duration for approximately four to six sessions. The treatment modality will be individual therapeutic services, including an eclectic therapeutic approach utilizing techniques from Cognitive Behavioral Therapy, Patient Centered Therapy, Dialectical Behavior Therapy, Acceptance and Commitment Therapy, Interpersonal Therapy, and Cognitive Restructuring. Therapeutic approach will include various interventions as appropriate, such as validation, support, mindfulness, thought defusion, reframing, psychoeducation, values assessment, and role playing. This provider will regularly review the treatment plan and medical chart to keep informed of status changes. Kyleigha expressed understanding and agreement with the initial treatment plan of care.

## 2018-12-25 ENCOUNTER — Other Ambulatory Visit: Payer: Self-pay

## 2018-12-25 ENCOUNTER — Ambulatory Visit (INDEPENDENT_AMBULATORY_CARE_PROVIDER_SITE_OTHER): Payer: BLUE CROSS/BLUE SHIELD | Admitting: Family Medicine

## 2018-12-25 ENCOUNTER — Encounter (INDEPENDENT_AMBULATORY_CARE_PROVIDER_SITE_OTHER): Payer: Self-pay | Admitting: Family Medicine

## 2018-12-25 VITALS — BP 106/70 | HR 72 | Temp 98.2°F | Ht 66.0 in | Wt 263.0 lb

## 2018-12-25 DIAGNOSIS — R7303 Prediabetes: Secondary | ICD-10-CM

## 2018-12-25 DIAGNOSIS — E559 Vitamin D deficiency, unspecified: Secondary | ICD-10-CM | POA: Diagnosis not present

## 2018-12-25 DIAGNOSIS — Z9189 Other specified personal risk factors, not elsewhere classified: Secondary | ICD-10-CM | POA: Diagnosis not present

## 2018-12-25 DIAGNOSIS — Z6841 Body Mass Index (BMI) 40.0 and over, adult: Secondary | ICD-10-CM

## 2018-12-25 MED ORDER — VITAMIN D (ERGOCALCIFEROL) 1.25 MG (50000 UNIT) PO CAPS: 50000.0000 [IU] | ORAL_CAPSULE | ORAL | 0 refills | Status: DC

## 2018-12-25 NOTE — Progress Notes (Signed)
Office: [phone]  /  Fax: [phone]   HPI:   Chief Complaint: OBESITY Kendra Allen is here to discuss her progress with her obesity treatment plan. She is on the Category 3 plan and is following her eating plan approximately 80 % of the time. She states she is walking 1 mile 1 time per week. Kendra Allen voices she never really felt full after any meals. She would occasionally drink water or eat a snack. Her snacks are of laughing cow cheese, carrots, pears, and peaches. She plans to get her wisdom teeth out in 1 week. Her weight is 263 lb (119.3 kg) today and has had a weight loss of 8 pounds over a period of 2 weeks since her last visit. She has lost 8 lbs since starting treatment with [place].  Vitamin D Deficiency Kendra Allen has a diagnosis of vitamin D deficiency. She is not currently taking OTC Vit D. She notes fatigue and denies nausea, vomiting or muscle weakness.  At risk for osteopenia and osteoporosis Kendra Allen is at higher risk of osteopenia and osteoporosis due to vitamin D deficiency.   Pre-Diabetes Kendra Allen has a diagnosis of pre-diabetes based on her elevated Hgb A1c and was informed this puts her at greater risk of developing diabetes. Last Hgb A1c was of 5.7 and insulin of 51.6. She is on metformin by her Dermatologist for hidradenitis. She continues to work on diet and exercise to decrease risk of diabetes. She denies nausea or hypoglycemia.  ASSESSMENT AND PLAN:  Vitamin D deficiency  Prediabetes  At risk for osteoporosis  Class 3 severe obesity with serious comorbidity and body mass index (BMI) of 40.0 to 44.9 in adult, unspecified obesity type (Kendra Allen)  PLAN:  Vitamin D Deficiency Kendra Allen was informed that low vitamin D levels contributes to fatigue and are associated with obesity, breast, and colon cancer. Kendra Allen agrees to start prescription Vit D 50,000 IU every week #4 with no refills. She will follow up for routine testing of vitamin D, at least 2-3 times per  year. She was informed of the risk of over-replacement of vitamin D and agrees to not increase her dose unless she discusses this with [place] first. Kendra Allen agrees to follow up with our clinic in 2 weeks.  At risk for osteopenia and osteoporosis Kendra Allen was given extended (30 minutes) osteoporosis prevention counseling today. Kendra Allen is at risk for osteopenia and osteoporsis due to her vitamin D deficiency. She was encouraged to take her vitamin D and follow her higher calcium diet and increase strengthening exercise to help strengthen her bones and decrease her risk of osteopenia and osteoporosis.  Pre-Diabetes Kendra Allen will continue to work on weight loss, exercise, and decreasing simple carbohydrates in her diet to help decrease the risk of diabetes. We dicussed metformin including benefits and risks. She was informed that eating too many simple carbohydrates or too many calories at one sitting increases the likelihood of GI side effects. Kendra Allen agrees to continue taking metformin BID, and she agrees to follow up with our clinic in 2 weeks as directed to monitor her progress.  Obesity Kendra Allen is currently in the action stage of change. As such, her goal is to continue with weight loss efforts She has agreed to keep a food journal with 500-650 calories and 45+ grams of protein at supper daily and follow the Category 3 plan + 100 calories Kendra Allen has been instructed to work up to a goal of 150 minutes of combined cardio and strengthening exercise per week for weight loss and  overall health benefits. We discussed the following Behavioral Modification Strategies today: increasing lean protein intake, increasing vegetables and work on meal planning and easy cooking plans, keeping healthy foods in the home, better snacking choices, and planning for success   Kendra Allen has agreed to follow up with our clinic in 2 weeks. She was informed of the importance of frequent follow up visits to maximize her  success with intensive lifestyle modifications for her multiple health conditions.  ALLERGIES: Allergies  Allergen Reactions  . Sulfamethoxazole-Trimethoprim     upset stomach    MEDICATIONS: Current Outpatient Medications on File Prior to Visit  Medication Sig Dispense Refill  . clindamycin (CLEOCIN T) 1 % external solution Apply 1 application topically as needed (hidradenitis suppurativa flare).     [name] doxycycline (DORYX) 150 MG EC tablet Take 150 mg by mouth daily.    [name] doxycycline (VIBRAMYCIN) 50 MG capsule 1 TABLET EVERY OTHER DAY ORALLY 30 DAYS    . hydrOXYzine (VISTARIL) 25 MG capsule Take 25mg s by mouth up to two times daily as needed for anxiety  1  . metFORMIN (GLUCOPHAGE-XR) 500 MG 24 hr tablet TAKE 1 TABLET BY MOUTH EVERY DAY IN THE EVENING WITH FOOD    . sertraline (ZOLOFT) 100 MG tablet Take 1 tablet (100 mg total) by mouth daily. (Patient taking differently: Take 100 mg by mouth 2 (two) times daily. ) 30 tablet 0   No current facility-administered medications on file prior to visit.     PAST MEDICAL HISTORY: Past Medical History:  Diagnosis Date  . Anemia   . Anxiety   . Chest pain   . Constipation   . Depression   . Hidradenitis suppurativa   . PCOS (polycystic ovarian syndrome)   . Vitamin D deficiency     PAST SURGICAL HISTORY: Past Surgical History:  Procedure Laterality Date  . CERVICAL POLYPECTOMY    . DILATATION & CURETTAGE/HYSTEROSCOPY WITH MYOSURE N/A 11/21/2016   Procedure: DILATATION & CURETTAGE/HYSTEROSCOPY WITH MYOSURE;  [name], MD;  Location: [facility];  Service: Gynecology;  [name];  . NO PAST SURGERIES      SOCIAL HISTORY: Social History   Tobacco Use  . Smoking status: Never Smoker  . Smokeless tobacco: Never Used  Substance Use Topics  . Alcohol use: No    Alcohol/week: 0.0 standard drinks  . Drug use: No    FAMILY HISTORY: Family History  Problem Relation Age of Onset  . Hypertension Mother   .  Depression Mother   . Obesity Mother   . Sleep apnea Father   . Obesity Father     ROS: Review of Systems  Constitutional: Positive for malaise/fatigue and weight loss.  Gastrointestinal: Negative for nausea and vomiting.  Musculoskeletal:       Negative muscle weakness  Endo/Heme/Allergies:       Negative hypoglycemia    PHYSICAL EXAM: Blood pressure 106/70, pulse 72, temperature 98.2 F (36.8 C), temperature source Oral, height 5\' 6"  (1.676 m), weight 263 lb (119.3 kg), last menstrual period 12/06/2018, SpO2 96 %. Body mass index is 42.45 kg/m. Physical Exam Vitals signs reviewed.  Constitutional:      Appearance: Normal appearance. She is obese.  Cardiovascular:     Rate and Rhythm: Normal rate.     Pulses: Normal pulses.  Pulmonary:     Effort: Pulmonary effort is normal.     Breath sounds: Normal breath sounds.  Musculoskeletal: Normal range of motion.  Skin:    General: Skin is  warm and dry.  Neurological:     Mental Status: She is alert and oriented to person, place, and time.  Psychiatric:        Mood and Affect: Mood normal.        Behavior: Behavior normal.     RECENT LABS AND TESTS: BMET    Component Value Date/Time   NA 142 12/11/2018 0939   K 4.3 12/11/2018 0939   CL 105 12/11/2018 0939   CO2 23 12/11/2018 0939   GLUCOSE 87 12/11/2018 0939   GLUCOSE 90 04/17/2018 1341   BUN 8 12/11/2018 0939   CREATININE 0.57 12/11/2018 0939   CALCIUM 9.5 12/11/2018 0939   GFRNONAA 132 12/11/2018 0939   GFRAA 152 12/11/2018 0939   Lab Results  Component Value Date   HGBA1C 5.7 (H) 12/11/2018   HGBA1C 5.6 04/17/2018   HGBA1C 5.5 12/22/2017   Lab Results  Component Value Date   INSULIN 51.6 (H) 12/11/2018   CBC    Component Value Date/Time   WBC 8.3 12/11/2018 0939   WBC 8.1 04/17/2018 1341   RBC 4.46 12/11/2018 0939   RBC 4.59 04/17/2018 1341   HGB 11.8 12/11/2018 0939   HCT 35.6 12/11/2018 0939   PLT 315.0 04/17/2018 1341   MCV 80 12/11/2018  0939   MCH 26.5 (L) 12/11/2018 0939   MCH 24.6 (L) 12/22/2017 1330   MCHC 33.1 12/11/2018 0939   MCHC 32.5 04/17/2018 1341   RDW 14.8 12/11/2018 0939   LYMPHSABS 2.4 12/11/2018 0939   MONOABS 0.6 04/17/2018 1341   EOSABS 0.2 12/11/2018 0939   BASOSABS 0.0 12/11/2018 0939   Iron/TIBC/Ferritin/ %Sat    Component Value Date/Time   IRON 34 (L) 04/17/2018 1341   TIBC 429 04/17/2018 1341   FERRITIN 34 04/17/2018 1341   IRONPCTSAT 8 (L) 04/17/2018 1341   Lipid Panel     Component Value Date/Time   CHOL 155 12/11/2018 0939   TRIG 89 12/11/2018 0939   HDL 46 12/11/2018 0939   CHOLHDL 3.7 12/22/2017 1330   VLDL 27 12/22/2017 1330   LDLCALC 91 12/11/2018 0939   Hepatic Function Panel     Component Value Date/Time   PROT 7.3 12/11/2018 0939   ALBUMIN 4.2 12/11/2018 0939   AST 12 12/11/2018 0939   ALT 13 12/11/2018 0939   ALKPHOS 79 12/11/2018 0939   BILITOT 0.4 12/11/2018 0939   BILIDIR 0.0 03/27/2016 1455      Component Value Date/Time   TSH 2.930 12/11/2018 0939   TSH 2.57 03/27/2016 1455   TSH 2.28 08/25/2014 1057      OBESITY BEHAVIORAL INTERVENTION VISIT  Today's visit was # 2   Starting weight: 271 lbs Starting date: 12/11/2018 Today's weight : 263 lbs Today's date: 12/25/2018 Total lbs lost to date: 8    ASK: We discussed the diagnosis of obesity with Kendra Allen today and Kendra Allen agreed to give [place] permission to discuss obesity behavioral modification therapy today.  ASSESS: Kendra Allen has the diagnosis of obesity and her BMI today is 42.47 Kendra Allen is in the action stage of change   ADVISE: Kendra Allen was educated on the multiple health risks of obesity as well as the benefit of weight loss to improve her health. She was advised of the need for long term treatment and the importance of lifestyle modifications to improve her current health and to decrease her risk of future health problems.  AGREE: Multiple dietary modification options and treatment  options were discussed and  Kendra Allen  agreed to follow the recommendations documented in the above note.  ARRANGE: Kendra Allen was educated on the importance of frequent visits to treat obesity as outlined per CMS and USPSTF guidelines and agreed to schedule her next follow up appointment today.  I, Trixie Dredge, am acting as transcriptionist for Ilene Qua, MD  I have reviewed the above documentation for accuracy and completeness, and I agree with the above. - Ilene Qua, MD

## 2019-01-07 ENCOUNTER — Ambulatory Visit (INDEPENDENT_AMBULATORY_CARE_PROVIDER_SITE_OTHER): Payer: Self-pay | Admitting: Psychology

## 2019-01-07 NOTE — Progress Notes (Unsigned)
Office: [phone]  /  Fax: [phone]    Date: January 07, 2019    Appointment Start Time:*** Duration:*** Provider: Glennie Isle, Psy.D. Type of Session: Individual Therapy  Location of Patient: *** Location of Provider: Provider's Home Type of Contact: Telepsychological Visit via [employer] Web[employer]   Session Content: Alyson is a 23 y.o. female presenting via [place] for a follow-up appointment to address the previously established treatment goal of decreasing emotional eating. Today's appointment was a telepsychological visit, as this provider's clinic is seeing a limited number of patients for in-person visits due to COVID-19. Therapeutic services will resume to in-person appointments once deemed appropriate. Christabelle expressed understanding regarding the rationale for telepsychological services, and provided verbal consent for today's appointment. Prior to proceeding with today's appointment, Quentin's physical location at the time of this appointment was obtained. Madelyne reported she was at *** and provided the address. In the event of technical difficulties, Shawntrice shared a phone number she could be reached at. Hatice and this provider participated in today's telepsychological service. Also, Tiphanie denied anyone else being present in the room or on the WebEx appointment ***.  This provider conducted a brief check-in and verbally administered the PHQ-9 and GAD-7. ***   Lelah was receptive to today's session as evidenced by openness to sharing, responsiveness to feedback, and ***.  Mental Status Examination:  Appearance: {Appearance:22431} Behavior: {Behavior:22445} Mood: {Teletherapy mood:22435} Affect: {Affect:22436} Speech: {Speech:22432} Eye Contact: {Eye Contact:22433} Psychomotor Activity: {Motor Activity:22434} Thought Process: {thought process:22448}  Content/Perceptual Disturbances: {disturbances:22451} Orientation: {Orientation:22437} Cognition/Sensorium:  {gbcognition:22449} Insight: {Insight:22446} Judgment: {Insight:22446}  Structured Assessment Results: The Patient Health Questionnaire-9 (PHQ-9) is a self-report measure that assesses symptoms and severity of depression over the course of the last two weeks. Nakaya obtained a score of *** suggesting {GBPHQ9SEVERITY:21752}. Lauran finds the endorsed symptoms to be {gbphq9difficulty:21754}. Little interest or pleasure in doing things ***  Feeling down, depressed, or hopeless ***  Trouble falling or staying asleep, or sleeping too much ***  Feeling tired or having little energy ***  Poor appetite or overeating ***  Feeling bad about yourself --- or that you are a failure or have let yourself or your family down ***  Trouble concentrating on things, such as reading the newspaper or watching television ***  Moving or speaking so slowly that other people could have noticed? Or the opposite --- being so fidgety or restless that you have been moving around a lot more than usual ***  Thoughts that you would be better off dead or hurting yourself in some way ***  PHQ-9 Score ***    The Generalized Anxiety Disorder-7 (GAD-7) is a brief self-report measure that assesses symptoms of anxiety over the course of the last two weeks. Audry obtained a score of *** suggesting {gbgad7severity:21753}. Starleen finds the endorsed symptoms to be {gbphq9difficulty:21754}. Feeling nervous, anxious, on edge ***  Not being able to stop or control worrying ***  Worrying too much about different things ***  Trouble relaxing ***  Being so restless that it's hard to sit still ***  Becoming easily annoyed or irritable ***  Feeling afraid as if something awful might happen ***  GAD-7 Score ***   Interventions:  {Interventions:22172}  DSM-5 Diagnosis: 311 (F32.8) Other Specified Depressive Disorder, Emotional Eating Behaviors  Treatment Goal & Progress: During the initial appointment with this provider, the  following treatment goal was established: decrease emotional eating. Progress is limited, as Kippy has just begun treatment with this provider; however, she is receptive to the  interaction and interventions and rapport is being established.   Plan: Taccara continues to appear able and willing to participate as evidenced by engagement in reciprocal conversation, and asking questions for clarification as appropriate. The next appointment will be scheduled in {gbweeks:21758}, which will be via [employer]. Once this provider's office resumes in-person appointments and it is deemed appropriate, Jeanae will be notified. The next session will focus on reviewing learned skills, and working towards the established treatment goal.***

## 2019-01-08 ENCOUNTER — Encounter (INDEPENDENT_AMBULATORY_CARE_PROVIDER_SITE_OTHER): Payer: Self-pay | Admitting: Family Medicine

## 2019-01-08 ENCOUNTER — Ambulatory Visit (INDEPENDENT_AMBULATORY_CARE_PROVIDER_SITE_OTHER): Payer: BC Managed Care – PPO | Admitting: Family Medicine

## 2019-01-08 ENCOUNTER — Other Ambulatory Visit: Payer: Self-pay

## 2019-01-08 VITALS — BP 109/74 | HR 62 | Temp 97.7°F | Ht 66.0 in | Wt 265.0 lb

## 2019-01-08 DIAGNOSIS — R7303 Prediabetes: Secondary | ICD-10-CM | POA: Diagnosis not present

## 2019-01-08 DIAGNOSIS — E559 Vitamin D deficiency, unspecified: Secondary | ICD-10-CM

## 2019-01-08 DIAGNOSIS — Z6841 Body Mass Index (BMI) 40.0 and over, adult: Secondary | ICD-10-CM

## 2019-01-08 DIAGNOSIS — E66813 Obesity, class 3: Secondary | ICD-10-CM

## 2019-01-13 NOTE — Progress Notes (Signed)
Office: [phone]  /  Fax: [phone]   HPI:   Chief Complaint: OBESITY Kendra Allen is here to discuss her progress with her obesity treatment plan. She is on the keep a food journal with 500-650 calories and 45+ grams of protein at supper daily and follow the Category 3 plan + 100 calories and is following her eating plan approximately 30 % of the time. She states she is exercising 0 minutes 0 times per week. Kendra Allen got her wisdom teeth removed yesterday morning, she is not able to tolerate much PO especially meat. She feels she didn't do as well the last 2 weeks. She has no plans for travel for the rest of the Summer.  Her weight is 265 lb (120.2 kg) today and has gained 2 lbs since her last visit. She has lost 6 lbs since starting treatment with [place].  Pre-Diabetes Kendra Allen has a diagnosis of pre-diabetes based on her elevated Hgb A1c of 5.7 and was informed this puts her at greater risk of developing diabetes. She is taking metformin currently and notes carbohydrate cravings. She continues to work on diet and exercise to decrease risk of diabetes.   Vitamin D Deficiency Kendra Allen has a diagnosis of vitamin D deficiency. She is currently taking prescription Vit D. She notes fatigue and denies nausea, vomiting or muscle weakness.  ASSESSMENT AND PLAN:  Prediabetes  Vitamin D deficiency  Class 3 severe obesity with serious comorbidity and body mass index (BMI) of 40.0 to 44.9 in adult, unspecified obesity type [facility])  PLAN:  Pre-Diabetes Kendra Allen is to get back on the meal plan when she is feeling better. She will continue to work on weight loss, exercise, and decreasing simple carbohydrates in her diet to help decrease the risk of diabetes. We dicussed metformin including benefits and risks. She was informed that eating too many simple carbohydrates or too many calories at one sitting increases the likelihood of GI side effects. Kendra Allen agrees to continue taking metformin, and she  agrees to follow up with our clinic in 2 weeks as directed to monitor her progress.  Vitamin D Deficiency Kendra Allen was informed that low vitamin D levels contributes to fatigue and are associated with obesity, breast, and colon cancer. Kendra Allen agrees to continue taking prescription Vit D 50,000 IU every week, no refill needed. She will follow up for routine testing of vitamin D, at least 2-3 times per year. She was informed of the risk of over-replacement of vitamin D and agrees to not increase her dose unless she discusses this with [place] first. Kendra Allen agrees to follow up with our clinic in 2 weeks.  Obesity Kendra Allen is currently in the action stage of change. As such, her goal is to continue with weight loss efforts She has agreed to follow the Category 3 plan + 100 calories Kendra Allen has been instructed to work up to a goal of 150 minutes of combined cardio and strengthening exercise per week for weight loss and overall health benefits. We discussed the following Behavioral Modification Strategies today: increasing lean protein intake, increasing vegetables and work on meal planning and easy cooking plans, keeping healthy foods in the home, and planning for success   Kendra Allen has agreed to follow up with our clinic in 2 weeks. She was informed of the importance of frequent follow up visits to maximize her success with intensive lifestyle modifications for her multiple health conditions.  ALLERGIES: Allergies  Allergen Reactions   Sulfamethoxazole-Trimethoprim     upset stomach    MEDICATIONS:  Current Outpatient Medications on File Prior to Visit  Medication Sig Dispense Refill   clindamycin (CLEOCIN T) 1 % external solution Apply 1 application topically as needed (hidradenitis suppurativa flare).      doxycycline (DORYX) 150 MG EC tablet Take 150 mg by mouth daily.     doxycycline (VIBRAMYCIN) 50 MG capsule 1 TABLET EVERY OTHER DAY ORALLY 30 DAYS     hydrOXYzine (VISTARIL) 25 MG  capsule Take 25mg s by mouth up to two times daily as needed for anxiety  1   metFORMIN (GLUCOPHAGE-XR) 500 MG 24 hr tablet TAKE 1 TABLET BY MOUTH EVERY DAY IN THE EVENING WITH FOOD     sertraline (ZOLOFT) 100 MG tablet Take 1 tablet (100 mg total) by mouth daily. (Patient taking differently: Take 100 mg by mouth 2 (two) times daily. ) 30 tablet 0   Vitamin D, Ergocalciferol, (DRISDOL) 1.25 MG (50000 UT) CAPS capsule Take 1 capsule (50,000 Units total) by mouth every 7 (seven) days. 4 capsule 0   No current facility-administered medications on file prior to visit.     PAST MEDICAL HISTORY: Past Medical History:  Diagnosis Date   Anemia    Anxiety    Chest pain    Constipation    Depression    Hidradenitis suppurativa    PCOS (polycystic ovarian syndrome)    Vitamin D deficiency     PAST SURGICAL HISTORY: Past Surgical History:  Procedure Laterality Date   CERVICAL POLYPECTOMY     DILATATION & CURETTAGE/HYSTEROSCOPY WITH MYOSURE N/A 11/21/2016   Procedure: DILATATION & CURETTAGE/HYSTEROSCOPY WITH MYOSURE;  [name], MD;  Location: [facility];  Service: Gynecology;  [name];   NO PAST SURGERIES      SOCIAL HISTORY: Social History   Tobacco Use   Smoking status: Never Smoker   Smokeless tobacco: Never Used  Substance Use Topics   Alcohol use: No    Alcohol/week: 0.0 standard drinks   Drug use: No    FAMILY HISTORY: Family History  Problem Relation Age of Onset   Hypertension Mother    Depression Mother    Obesity Mother    Sleep apnea Father    Obesity Father     ROS: Review of Systems  Constitutional: Positive for malaise/fatigue and weight loss.  Gastrointestinal: Negative for nausea and vomiting.  Musculoskeletal:       Negative muscle weakness    PHYSICAL EXAM: Blood pressure 109/74, pulse 62, temperature 97.7 F (36.5 C), temperature source Oral, height 5\' 6"  (1.676 m), weight 265 lb (120.2 kg), last menstrual  period 01/08/2019, SpO2 96 %. Body mass index is 42.77 kg/m. Physical Exam Vitals signs reviewed.  Constitutional:      Appearance: Normal appearance. She is obese.  Cardiovascular:     Rate and Rhythm: Normal rate.     Pulses: Normal pulses.  Pulmonary:     Effort: Pulmonary effort is normal.     Breath sounds: Normal breath sounds.  Musculoskeletal: Normal range of motion.  Skin:    General: Skin is warm and dry.  Neurological:     Mental Status: She is alert and oriented to person, place, and time.  Psychiatric:        Mood and Affect: Mood normal.        Behavior: Behavior normal.     RECENT LABS AND TESTS: BMET    Component Value Date/Time   NA 142 12/11/2018 0939   K 4.3 12/11/2018 0939   CL 105 12/11/2018 0939  CO2 23 12/11/2018 0939   GLUCOSE 87 12/11/2018 0939   GLUCOSE 90 04/17/2018 1341   BUN 8 12/11/2018 0939   CREATININE 0.57 12/11/2018 0939   CALCIUM 9.5 12/11/2018 0939   GFRNONAA 132 12/11/2018 0939   GFRAA 152 12/11/2018 0939   Lab Results  Component Value Date   HGBA1C 5.7 (H) 12/11/2018   HGBA1C 5.6 04/17/2018   HGBA1C 5.5 12/22/2017   Lab Results  Component Value Date   INSULIN 51.6 (H) 12/11/2018   CBC    Component Value Date/Time   WBC 8.3 12/11/2018 0939   WBC 8.1 04/17/2018 1341   RBC 4.46 12/11/2018 0939   RBC 4.59 04/17/2018 1341   HGB 11.8 12/11/2018 0939   HCT 35.6 12/11/2018 0939   PLT 315.0 04/17/2018 1341   MCV 80 12/11/2018 0939   MCH 26.5 (L) 12/11/2018 0939   MCH 24.6 (L) 12/22/2017 1330   MCHC 33.1 12/11/2018 0939   MCHC 32.5 04/17/2018 1341   RDW 14.8 12/11/2018 0939   LYMPHSABS 2.4 12/11/2018 0939   MONOABS 0.6 04/17/2018 1341   EOSABS 0.2 12/11/2018 0939   BASOSABS 0.0 12/11/2018 0939   Iron/TIBC/Ferritin/ %Sat    Component Value Date/Time   IRON 34 (L) 04/17/2018 1341   TIBC 429 04/17/2018 1341   FERRITIN 34 04/17/2018 1341   IRONPCTSAT 8 (L) 04/17/2018 1341   Lipid Panel     Component Value  Date/Time   CHOL 155 12/11/2018 0939   TRIG 89 12/11/2018 0939   HDL 46 12/11/2018 0939   CHOLHDL 3.7 12/22/2017 1330   VLDL 27 12/22/2017 1330   LDLCALC 91 12/11/2018 0939   Hepatic Function Panel     Component Value Date/Time   PROT 7.3 12/11/2018 0939   ALBUMIN 4.2 12/11/2018 0939   AST 12 12/11/2018 0939   ALT 13 12/11/2018 0939   ALKPHOS 79 12/11/2018 0939   BILITOT 0.4 12/11/2018 0939   BILIDIR 0.0 03/27/2016 1455      Component Value Date/Time   TSH 2.930 12/11/2018 0939   TSH 2.57 03/27/2016 1455   TSH 2.28 08/25/2014 1057      OBESITY BEHAVIORAL INTERVENTION VISIT  Today's visit was # 3   Starting weight: 271 lbs Starting date: 12/11/2018 Today's weight : 265 lbs Today's date: 01/08/2019 Total lbs lost to date: 6    ASK: We discussed the diagnosis of obesity with Kendra Allen today and Kendra Allen agreed to give [place] permission to discuss obesity behavioral modification therapy today.  ASSESS: Kendra Allen has the diagnosis of obesity and her BMI today is 42.79 Kendra Allen is in the action stage of change   ADVISE: Kendra Allen was educated on the multiple health risks of obesity as well as the benefit of weight loss to improve her health. She was advised of the need for long term treatment and the importance of lifestyle modifications to improve her current health and to decrease her risk of future health problems.  AGREE: Multiple dietary modification options and treatment options were discussed and  Kendra Allen agreed to follow the recommendations documented in the above note.  ARRANGE: Kendra Allen was educated on the importance of frequent visits to treat obesity as outlined per CMS and USPSTF guidelines and agreed to schedule her next follow up appointment today.  I, Kendra Allen, am acting as transcriptionist for Kendra Qua, MD  I have reviewed the above documentation for accuracy and completeness, and I agree with the above. - Kendra Qua, MD

## 2019-01-15 ENCOUNTER — Other Ambulatory Visit (INDEPENDENT_AMBULATORY_CARE_PROVIDER_SITE_OTHER): Payer: Self-pay | Admitting: Family Medicine

## 2019-01-22 NOTE — Progress Notes (Signed)
Office: [phone]  /  Fax: [phone]    Date: January 23, 2019   Appointment Start Time: 10:08am Duration: 23 minutes Provider: Glennie Isle, Psy.D. Type of Session: Individual Therapy  Location of Patient: Car outside family member's home Location of Provider: [facility] Type of Contact: Telepsychological Visit via [employer] WebEx   Session Content: Kendra Allen is a 23 y.o. female presenting via [place] for a follow-up appointment to address the previously established treatment goal of decreasing emotional eating. Of note, this provider called Kendra Allen at 10:02am as she did not present for today's appointment. A HIPAA compliant voicemail was left requesting a call back. Kendra Allen called the office back, but was reportedly placed on hold. When the call was transferred to this provider, this provider explained today's appointment could be conducted; however, it would be shorter in duration due to the late start. Kendra Allen acknowledged understanding and verbally consented to proceed. The e-mail with the secure link for today's appointment was re-sent. Today's appointment was a telepsychological visit, as this provider's clinic is seeing a limited number of patients for in-person visits due to COVID-19. Therapeutic services will resume to in-person appointments once deemed appropriate. Kendra Allen expressed understanding regarding the rationale for telepsychological services, and provided verbal consent for today's appointment. Prior to proceeding with today's appointment, Kendra Allen's physical location at the time of this appointment was obtained. Kendra Allen reported she was in her car outside a family member's home and provided the address. In the event of technical difficulties, Kendra Allen shared a phone number she could be reached at. Kendra Allen and this provider participated in today's telepsychological service. Also, Kendra Allen denied anyone else being present in the car or on the WebEx  appointment.  This provider conducted a brief check-in and verbally administered the PHQ-9 and GAD-7. Kendra Allen disclosed gaining weight during her last weigh in, but believes it was due to her menstrual cycle. When she had her wisdom teeth removed, Kendra Allen further reported deviating from the structured meal plan. She added she focuses more on portion control and making better choices. Regarding emotional eating, she recalled stress has resulted in her consuming something "sweet or carbonated." Kendra Allen also described engaging in mindless eating, but noted an overall reduction. Positive reinforcement for progress was provided. Moreover, psychoeducation regarding triggers for emotional eating was provided. Kendra Allen was provided a handout, and encouraged to utilize the handout between now and the next appointment to increase awareness of triggers and frequency. Kendra Allen agreed. This provider also discussed behavioral strategies for specific triggers, such as placing the utensil down when conversing to avoid mindless eating. Kendra Allen provided verbal consent during today's appointment for this provider to send the handout about triggers via e-mail. Overall, Kendra Allen was receptive to today's session as evidenced by openness to sharing, responsiveness to feedback, and willingness to explore triggers for emotional eating.  Mental Status Examination:  Appearance: neat Behavior: cooperative Mood: euthymic Affect: mood congruent Speech: normal in rate, volume, and tone Eye Contact: appropriate Psychomotor Activity: appropriate Thought Process: linear, logical, and goal directed  Content/Perceptual Disturbances: denies suicidal and homicidal ideation, plan, and intent and no hallucinations, delusions, bizarre thinking or behavior reported or observed Orientation: time, person, place and purpose of appointment Cognition/Sensorium: memory, attention, language, and fund of knowledge intact  Insight:  good Judgment: good  Structured Assessment Results: The Patient Health Questionnaire-9 (PHQ-9) is a self-report measure that assesses symptoms and severity of depression over the course of the last two weeks. Kendra Allen obtained a score of 1 suggesting minimal depression.  Kendra Allen finds the endorsed symptoms to be somewhat difficult. Kendra Allen 0  Feeling down, depressed, or hopeless 1  Trouble falling or staying asleep, or sleeping too much 0  Feeling tired or having Kendra energy 0  Poor appetite or overeating 0  Feeling bad about yourself --- or that you are a failure or have let yourself or your family down 0  Trouble concentrating on Allen, such as reading the newspaper or watching television 0  Moving or speaking so slowly that other people could have noticed? Or the opposite --- being so fidgety or restless that you have been moving around a lot more than usual 0  Thoughts that you would be better off dead or hurting yourself in some way 0  PHQ-9 Score 1    The Generalized Anxiety Disorder-7 (GAD-7) is a brief self-report measure that assesses symptoms of anxiety over the course of the last two weeks. Kendra Allen obtained a score of 0. Feeling nervous, anxious, on edge 0  Not being able to stop or control worrying 0  Worrying too much about different Allen 0  Trouble relaxing 0  Being so restless that it's hard to sit still 0  Becoming easily annoyed or irritable 0  Feeling afraid as if something awful might happen 0  GAD-7 Score 0   Interventions:  Conducted a brief chart review Verbal administration of PHQ-9 and GAD-7 for symptom monitoring Provided empathic reflections and validation Reviewed content from the previous session Psychoeducation provided regarding triggers for emotional eating Provided positive reinforcement Focused on rapport building Employed supportive psychotherapy interventions to facilitate reduced distress, and to improve  coping skills with identified stressors  DSM-5 Diagnosis: 311 (F32.8) Other Specified Depressive Disorder, Emotional Eating Behaviors  Treatment Goal & Progress: During the initial appointment with this provider, the following treatment goal was established: decrease emotional eating. Progress is limited, as Tyrone has just begun treatment with this provider; however, she is receptive to the interaction and interventions and rapport is being established.   Plan: Shakayla continues to appear able and willing to participate as evidenced by engagement in reciprocal conversation, and asking questions for clarification as appropriate. The next appointment will be scheduled in two weeks, which will be via [employer]. Once this provider's office resumes in-person appointments and it is deemed appropriate, Maezie will be notified. The next session will focus on reviewing triggers for emotional eating and the introduction of mindfulness.

## 2019-01-23 ENCOUNTER — Ambulatory Visit (INDEPENDENT_AMBULATORY_CARE_PROVIDER_SITE_OTHER): Payer: BC Managed Care – PPO | Admitting: Psychology

## 2019-01-23 ENCOUNTER — Other Ambulatory Visit: Payer: Self-pay

## 2019-01-23 DIAGNOSIS — F3289 Other specified depressive episodes: Secondary | ICD-10-CM | POA: Diagnosis not present

## 2019-01-30 ENCOUNTER — Other Ambulatory Visit: Payer: Self-pay

## 2019-01-30 ENCOUNTER — Ambulatory Visit (INDEPENDENT_AMBULATORY_CARE_PROVIDER_SITE_OTHER): Payer: BC Managed Care – PPO | Admitting: Family Medicine

## 2019-01-30 VITALS — BP 90/63 | HR 80 | Temp 98.2°F | Ht 66.0 in | Wt 257.0 lb

## 2019-01-30 DIAGNOSIS — R7303 Prediabetes: Secondary | ICD-10-CM | POA: Diagnosis not present

## 2019-01-30 DIAGNOSIS — E66813 Obesity, class 3: Secondary | ICD-10-CM

## 2019-01-30 DIAGNOSIS — Z6841 Body Mass Index (BMI) 40.0 and over, adult: Secondary | ICD-10-CM

## 2019-01-30 DIAGNOSIS — E559 Vitamin D deficiency, unspecified: Secondary | ICD-10-CM | POA: Diagnosis not present

## 2019-01-30 DIAGNOSIS — Z9189 Other specified personal risk factors, not elsewhere classified: Secondary | ICD-10-CM | POA: Diagnosis not present

## 2019-01-30 MED ORDER — VITAMIN D (ERGOCALCIFEROL) 1.25 MG (50000 UNIT) PO CAPS: 50000.0000 [IU] | ORAL_CAPSULE | ORAL | 0 refills | Status: DC

## 2019-01-30 NOTE — Progress Notes (Signed)
Office: [phone]  /  Fax: [phone]    Date: February 06, 2019   Appointment Start Time: 2:00pm Duration: 30 minutes Provider: Glennie Isle, Psy.D. Type of Session: Individual Therapy  Location of Patient: Home Location of Provider: [facility] Type of Contact: Telepsychological Visit via [employer] WebEx   Session Content: Kendra Allen is a 23 y.o. female presenting via [place] for a follow-up appointment to address the previously established treatment goal of decreasing emotional eating. Today's appointment was a telepsychological visit, as this provider's clinic is seeing a limited number of patients for in-person visits due to COVID-19. Therapeutic services will resume to in-person appointments once deemed appropriate. Kendra Allen expressed understanding regarding the rationale for telepsychological services, and provided verbal consent for today's appointment. Prior to proceeding with today's appointment, Kendra Allen's physical location at the time of this appointment was obtained. Kendra Allen reported she was at home and provided the address. In the event of technical difficulties, Kendra Allen shared a phone number she could be reached at. Kendra Allen and this provider participated in today's telepsychological service. Also, Kendra Allen denied anyone else being present in the room or on the WebEx appointment.  This provider conducted a brief check-in and verbally administered the PHQ-9 and GAD-7. Kendra Allen shared she has lost a total of 14 pounds, but reported, "I haven't been doing the best lately and not the worst." She also stated she has added physical activity to her routine. Triggers for emotional eating were reviewed. She identified out of habit is a trigger recently. Kendra Allen believes that despite deviations from the structured meal plan, there is a reduction in emotional eating. To assist with coping, psychoeducation regarding mindfulness was provided. A handout was provided to  Kendra Allen with further information regarding mindfulness, including exercises. This provider also explained the benefit of mindfulness as it relates to emotional eating. Kendra Allen was encouraged to engage in the provided exercises between now and the next appointment with this provider. Kendra Allen agreed. She was led through an exercise involving her senses. Kendra Allen provided verbal consent during today's appointment for this provider to send a handout about mindfulness via e-mail.  Kendra Allen was receptive to today's session as evidenced by openness to sharing, responsiveness to feedback, and willingness to engage in mindfulness exercises.  Mental Status Examination:  Appearance: neat Behavior: cooperative Mood: euthymic Affect: mood congruent Speech: normal in rate, volume, and tone Eye Contact: appropriate Psychomotor Activity: appropriate Thought Process: linear, logical, and goal directed  Content/Perceptual Disturbances: no hallucinations, delusions, bizarre thinking or behavior reported or observed and no evidence of suicidal and homicidal ideation, plan, and intent Orientation: time, person, place and purpose of appointment Cognition/Sensorium: memory, attention, language, and fund of knowledge intact  Insight: good Judgment: good  Structured Assessment Results: The Patient Health Questionnaire-9 (PHQ-9) is a self-report measure that assesses symptoms and severity of depression over the course of the last two weeks. Kendra Allen obtained a score of 3 suggesting minimal depression. Kendra Allen finds the endorsed symptoms to be somewhat difficult. Little interest or pleasure in doing things 0  Feeling down, depressed, or hopeless 0  Trouble falling or staying asleep, or sleeping too much 1  Feeling tired or having little energy 0  Poor appetite or overeating 0  Feeling bad about yourself --- or that you are a failure or have let yourself or your family down 2  Trouble concentrating on things, such  as reading the newspaper or watching television 0  Moving or speaking so slowly that other people could have noticed? Or  the opposite --- being so fidgety or restless that you have been moving around a lot more than usual 0  Thoughts that you would be better off dead or hurting yourself in some way 0  PHQ-9 Score 3    The Generalized Anxiety Disorder-7 (GAD-7) is a brief self-report measure that assesses symptoms of anxiety over the course of the last two weeks. Kendra Allen obtained a score of 0. Feeling nervous, anxious, on edge 0  Not being able to stop or control worrying 0  Worrying too much about different things 0  Trouble relaxing 0  Being so restless that it's hard to sit still 0  Becoming easily annoyed or irritable 0  Feeling afraid as if something awful might happen 0  GAD-7 Score 0   Interventions:  Conducted a brief chart review Verbal administration of PHQ-9 and GAD-7 for symptom monitoring Provided empathic reflections and validation Reviewed content from the previous session Psychoeducation provided regarding mindfulness Engaged patient in a mindfulness exercise Employed supportive psychotherapy interventions to facilitate reduced distress, and to improve coping skills with identified stressors Employed acceptance and commitment interventions to emphasize mindfulness and acceptance without struggle  DSM-5 Diagnosis: 311 (F32.8) Other Specified Depressive Disorder, Emotional Eating Behaviors  Treatment Goal & Progress: During the initial appointment with this provider, the following treatment goal was established: decrease emotional eating. Kendra Allen has demonstrated progress in her goal as evidenced by increased awareness of hunger patterns and triggers for emotional eating.   Plan: Kendra Allen continues to appear able and willing to participate as evidenced by engagement in reciprocal conversation, and asking questions for clarification as appropriate. The next appointment will  be scheduled in three weeks, which will be via [employer]. The next session will focus further on mindfulness.

## 2019-02-04 NOTE — Progress Notes (Signed)
Office: [phone]  /  Fax: [phone]   HPI:   Chief Complaint: OBESITY Kendra Allen is here to discuss her progress with her obesity treatment plan. She is on the Category 3 plan + 100 calories and is following her eating plan approximately 75 % of the time. She states she is walking 2 miles 1 time per week. Kendra Allen has been focusing more on what she is eating, and is questioning if she is hungry versus if she wants to eat. She denies obstacles in upcoming weeks.  Her weight is 257 lb (116.6 kg) today and has had a weight loss of 8 pounds over a period of 3 weeks since her last visit. She has lost 14 lbs since starting treatment with [place].  Vitamin D Deficiency Kendra Allen has a diagnosis of vitamin D deficiency. She is currently taking prescription Vit D. She notes fatigue and denies nausea, vomiting or muscle weakness.  At risk for osteopenia and osteoporosis Kendra Allen is at higher risk of osteopenia and osteoporosis due to vitamin D deficiency.   Pre-Diabetes Kendra Allen has a diagnosis of pre-diabetes based on her elevated Hgb A1c and was informed this puts her at greater risk of developing diabetes. She is experiencing significant GI upset with metformin. She stopped metformin recently. She continues to work on diet and exercise to decrease risk of diabetes. She denies hypoglycemia.  ASSESSMENT AND PLAN:  Vitamin D deficiency  Prediabetes  At risk for osteoporosis  Class 3 severe obesity with serious comorbidity and body mass index (BMI) of 40.0 to 44.9 in adult, unspecified obesity type ([name])  PLAN:  Vitamin D Deficiency Kendra Allen was informed that low vitamin D levels contributes to fatigue and are associated with obesity, breast, and colon cancer. Kendra Allen agrees to continue taking prescription Vit D 50,000 IU every week #4 and we will refill for 1 month. She will follow up for routine testing of vitamin D, at least 2-3 times per year. She was informed of the risk of  over-replacement of vitamin D and agrees to not increase her dose unless she discusses this with [place] first. Kendra Allen agrees to follow up with our clinic in 2 weeks.  At risk for osteopenia and osteoporosis Kendra Allen was given extended (15 minutes) osteoporosis prevention counseling today. Kendra Allen is at risk for osteopenia and osteoporsis due to her vitamin D deficiency. She was encouraged to take her vitamin D and follow her higher calcium diet and increase strengthening exercise to help strengthen her bones and decrease her risk of osteopenia and osteoporosis.  Pre-Diabetes Kendra Allen will continue to work on weight loss, exercise, and decreasing simple carbohydrates in her diet to help decrease the risk of diabetes. We dicussed metformin including benefits and risks. She was informed that eating too many simple carbohydrates or too many calories at one sitting increases the likelihood of GI side effects. Kendra Allen declined metformin for now and a prescription was not written today. We will retest labs in early October. Kendra Allen agrees to follow up with our clinic in 2 weeks as directed to monitor her progress.  Obesity Kendra Allen is currently in the action stage of change. As such, her goal is to continue with weight loss efforts She has agreed to follow the Category 3 plan + 100 calories Kendra Allen has been instructed to work up to a goal of 150 minutes of combined cardio and strengthening exercise per week for weight loss and overall health benefits. We discussed the following Behavioral Modification Strategies today: increasing lean protein intake, increasing vegetables and  work on meal planning and easy cooking plans, keeping healthy foods in the home, and planning for success Kendra Allen is to start thinking about physical activity over the next few weeks.  Kendra Allen has agreed to follow up with our clinic in 2 weeks. She was informed of the importance of frequent follow up visits to maximize her success  with intensive lifestyle modifications for her multiple health conditions.  ALLERGIES: Allergies  Allergen Reactions  . Sulfamethoxazole-Trimethoprim     upset stomach    MEDICATIONS: Current Outpatient Medications on File Prior to Visit  Medication Sig Dispense Refill  . clindamycin (CLEOCIN T) 1 % external solution Apply 1 application topically as needed (hidradenitis suppurativa flare).     [name] doxycycline (VIBRAMYCIN) 50 MG capsule 1 TABLET EVERY OTHER DAY ORALLY 30 DAYS    . doxycycline (VIBRAMYCIN) 50 MG capsule Take 50 mg by mouth daily.     . hydrOXYzine (VISTARIL) 25 MG capsule Take 25mg s by mouth up to two times daily as needed for anxiety  1  . sertraline (ZOLOFT) 100 MG tablet Take 1 tablet (100 mg total) by mouth daily. (Patient taking differently: Take 100 mg by mouth 2 (two) times daily. ) 30 tablet 0   No current facility-administered medications on file prior to visit.     PAST MEDICAL HISTORY: Past Medical History:  Diagnosis Date  . Anemia   . Anxiety   . Chest pain   . Constipation   . Depression   . Hidradenitis suppurativa   . PCOS (polycystic ovarian syndrome)   . Vitamin D deficiency     PAST SURGICAL HISTORY: Past Surgical History:  Procedure Laterality Date  . CERVICAL POLYPECTOMY    . DILATATION & CURETTAGE/HYSTEROSCOPY WITH MYOSURE N/A 11/21/2016   Procedure: DILATATION & CURETTAGE/HYSTEROSCOPY WITH MYOSURE;  [name], MD;  Location: [facility];  Service: Gynecology;  [name];  . NO PAST SURGERIES      SOCIAL HISTORY: Social History   Tobacco Use  . Smoking status: Never Smoker  . Smokeless tobacco: Never Used  Substance Use Topics  . Alcohol use: No    Alcohol/week: 0.0 standard drinks  . Drug use: No    FAMILY HISTORY: Family History  Problem Relation Age of Onset  . Hypertension Mother   . Depression Mother   . Obesity Mother   . Sleep apnea Father   . Obesity Father     ROS: Review of Systems   Constitutional: Positive for malaise/fatigue and weight loss.  Gastrointestinal: Negative for nausea and vomiting.  Musculoskeletal:       Negative muscle weakness  Endo/Heme/Allergies:       Negative hypoglycemia    PHYSICAL EXAM: Blood pressure 90/63, pulse 80, temperature 98.2 F (36.8 C), temperature source Oral, height 5\' 6"  (1.676 m), weight 257 lb (116.6 kg), last menstrual period 01/08/2019, SpO2 95 %. Body mass index is 41.48 kg/m. Physical Exam Vitals signs reviewed.  Constitutional:      Appearance: Normal appearance. She is obese.  Cardiovascular:     Rate and Rhythm: Normal rate.     Pulses: Normal pulses.  Pulmonary:     Effort: Pulmonary effort is normal.     Breath sounds: Normal breath sounds.  Musculoskeletal: Normal range of motion.  Skin:    General: Skin is warm and dry.  Neurological:     Mental Status: She is alert and oriented to person, place, and time.  Psychiatric:        Mood and  Affect: Mood normal.        Behavior: Behavior normal.     RECENT LABS AND TESTS: BMET    Component Value Date/Time   NA 142 12/11/2018 0939   K 4.3 12/11/2018 0939   CL 105 12/11/2018 0939   CO2 23 12/11/2018 0939   GLUCOSE 87 12/11/2018 0939   GLUCOSE 90 04/17/2018 1341   BUN 8 12/11/2018 0939   CREATININE 0.57 12/11/2018 0939   CALCIUM 9.5 12/11/2018 0939   GFRNONAA 132 12/11/2018 0939   GFRAA 152 12/11/2018 0939   Lab Results  Component Value Date   HGBA1C 5.7 (H) 12/11/2018   HGBA1C 5.6 04/17/2018   HGBA1C 5.5 12/22/2017   Lab Results  Component Value Date   INSULIN 51.6 (H) 12/11/2018   CBC    Component Value Date/Time   WBC 8.3 12/11/2018 0939   WBC 8.1 04/17/2018 1341   RBC 4.46 12/11/2018 0939   RBC 4.59 04/17/2018 1341   HGB 11.8 12/11/2018 0939   HCT 35.6 12/11/2018 0939   PLT 315.0 04/17/2018 1341   MCV 80 12/11/2018 0939   MCH 26.5 (L) 12/11/2018 0939   MCH 24.6 (L) 12/22/2017 1330   MCHC 33.1 12/11/2018 0939   MCHC 32.5  04/17/2018 1341   RDW 14.8 12/11/2018 0939   LYMPHSABS 2.4 12/11/2018 0939   MONOABS 0.6 04/17/2018 1341   EOSABS 0.2 12/11/2018 0939   BASOSABS 0.0 12/11/2018 0939   Iron/TIBC/Ferritin/ %Sat    Component Value Date/Time   IRON 34 (L) 04/17/2018 1341   TIBC 429 04/17/2018 1341   FERRITIN 34 04/17/2018 1341   IRONPCTSAT 8 (L) 04/17/2018 1341   Lipid Panel     Component Value Date/Time   CHOL 155 12/11/2018 0939   TRIG 89 12/11/2018 0939   HDL 46 12/11/2018 0939   CHOLHDL 3.7 12/22/2017 1330   VLDL 27 12/22/2017 1330   LDLCALC 91 12/11/2018 0939   Hepatic Function Panel     Component Value Date/Time   PROT 7.3 12/11/2018 0939   ALBUMIN 4.2 12/11/2018 0939   AST 12 12/11/2018 0939   ALT 13 12/11/2018 0939   ALKPHOS 79 12/11/2018 0939   BILITOT 0.4 12/11/2018 0939   BILIDIR 0.0 03/27/2016 1455      Component Value Date/Time   TSH 2.930 12/11/2018 0939   TSH 2.57 03/27/2016 1455   TSH 2.28 08/25/2014 1057      OBESITY BEHAVIORAL INTERVENTION VISIT  Today's visit was # 4   Starting weight: 271 lbs Starting date: 12/11/2018 Today's weight : 257 lbs Today's date: 01/30/2019 Total lbs lost to date: 109    ASK: We discussed the diagnosis of obesity with [name] today and Doretha agreed to give [place] permission to discuss obesity behavioral modification therapy today.  ASSESS: Elzora has the diagnosis of obesity and her BMI today is 41.5 Michiko is in the action stage of change   ADVISE: Vanshika was educated on the multiple health risks of obesity as well as the benefit of weight loss to improve her health. She was advised of the need for long term treatment and the importance of lifestyle modifications to improve her current health and to decrease her risk of future health problems.  AGREE: Multiple dietary modification options and treatment options were discussed and  Charissa agreed to follow the recommendations documented in the above note.   ARRANGE: Rondell was educated on the importance of frequent visits to treat obesity as outlined per CMS and USPSTF guidelines and  agreed to schedule her next follow up appointment today.  I, Trixie Dredge, am acting as transcriptionist for Ilene Qua, MD  I have reviewed the above documentation for accuracy and completeness, and I agree with the above. - Ilene Qua, MD

## 2019-02-05 ENCOUNTER — Encounter (INDEPENDENT_AMBULATORY_CARE_PROVIDER_SITE_OTHER): Payer: Self-pay

## 2019-02-06 ENCOUNTER — Ambulatory Visit (INDEPENDENT_AMBULATORY_CARE_PROVIDER_SITE_OTHER): Payer: BC Managed Care – PPO | Admitting: Psychology

## 2019-02-06 ENCOUNTER — Other Ambulatory Visit: Payer: Self-pay

## 2019-02-06 DIAGNOSIS — F3289 Other specified depressive episodes: Secondary | ICD-10-CM | POA: Diagnosis not present

## 2019-02-17 ENCOUNTER — Ambulatory Visit (INDEPENDENT_AMBULATORY_CARE_PROVIDER_SITE_OTHER): Payer: BC Managed Care – PPO | Admitting: Family Medicine

## 2019-02-17 ENCOUNTER — Encounter (INDEPENDENT_AMBULATORY_CARE_PROVIDER_SITE_OTHER): Payer: Self-pay | Admitting: Family Medicine

## 2019-02-17 ENCOUNTER — Other Ambulatory Visit: Payer: Self-pay

## 2019-02-17 VITALS — BP 103/69 | HR 72 | Temp 98.1°F | Ht 66.0 in | Wt 261.0 lb

## 2019-02-17 DIAGNOSIS — R7303 Prediabetes: Secondary | ICD-10-CM

## 2019-02-17 DIAGNOSIS — Z9189 Other specified personal risk factors, not elsewhere classified: Secondary | ICD-10-CM

## 2019-02-17 DIAGNOSIS — E559 Vitamin D deficiency, unspecified: Secondary | ICD-10-CM

## 2019-02-17 DIAGNOSIS — Z6841 Body Mass Index (BMI) 40.0 and over, adult: Secondary | ICD-10-CM

## 2019-02-17 MED ORDER — VITAMIN D (ERGOCALCIFEROL) 1.25 MG (50000 UNIT) PO CAPS: 50000.0000 [IU] | ORAL_CAPSULE | ORAL | 0 refills | Status: DC

## 2019-02-17 NOTE — Progress Notes (Signed)
Office: [phone]  /  Fax: [phone]   HPI:   Chief Complaint: OBESITY Kendra Allen is here to discuss her progress with her obesity treatment plan. She is on the Category 3 plan + 100 calories and is following her eating plan approximately 35 % of the time. She states she is walking for 2 miles 2-3 times per week. Kendra Allen voices the last few weeks were more difficult in terms of staying on the plan. She is not used to cooking with different spices or seasonings.  She denies obstacles or excursions.  Her weight is 261 lb (118.4 kg) today and has gained 4 lbs since her last visit. She has lost 10 lbs since starting treatment with [place].  Vitamin D Deficiency Kendra Allen has a diagnosis of vitamin D deficiency. She is currently taking prescription Vit D. She notes fatigue and denies nausea, vomiting or muscle weakness.  At risk for osteopenia and osteoporosis Kendra Allen is at higher risk of osteopenia and osteoporosis due to vitamin D deficiency.   Pre-Diabetes Kendra Allen has a diagnosis of pre-diabetes based on her elevated Hgb A1c and was informed this puts her at greater risk of developing diabetes. Last Hgb A1c was of 5.7 and insulin of 51.6. She is not taking metformin currently and continues to work on diet and exercise to decrease risk of diabetes.   ASSESSMENT AND PLAN:  Vitamin D deficiency - Plan: Vitamin D, Ergocalciferol, (DRISDOL) 1.25 MG (50000 UT) CAPS capsule  Prediabetes  At risk for osteoporosis  Class 3 severe obesity with serious comorbidity and body mass index (BMI) of 40.0 to 44.9 in adult, unspecified obesity type ([place])  PLAN:  Vitamin D Deficiency Kendra Allen was informed that low vitamin D levels contributes to fatigue and are associated with obesity, breast, and colon cancer. Kendra Allen agrees to continue taking prescription Vit D 50,000 IU every week #4 and we will refill for 1 month. She will follow up for routine testing of vitamin D, at least 2-3 times per year.  She was informed of the risk of over-replacement of vitamin D and agrees to not increase her dose unless she discusses this with [place] first. Kendra Allen agrees to follow up with our clinic in 2 weeks.  At risk for osteopenia and osteoporosis Kendra Allen was given extended  (15 minutes) osteoporosis prevention counseling today. Kendra Allen is at risk for osteopenia and osteoporsis due to her vitamin D deficiency. She was encouraged to take her vitamin D and follow her higher calcium diet and increase strengthening exercise to help strengthen her bones and decrease her risk of osteopenia and osteoporosis.  Pre-Diabetes Kendra Allen will continue to work on weight loss, exercise, and decreasing simple carbohydrates in her diet to help decrease the risk of diabetes. We dicussed metformin including benefits and risks. She was informed that eating too many simple carbohydrates or too many calories at one sitting increases the likelihood of GI side effects. Kendra Allen declined metformin for now and a prescription was not written today. We will repeat labs in early October. Kendra Allen agrees to follow up with our clinic in 2 weeks as directed to monitor her progress.  Obesity Kendra Allen is currently in the action stage of change. As such, her goal is to continue with weight loss efforts She has agreed to follow the Category 3 plan  Kendra Allen is to follow the meal plan 2/3 meals a day. Kendra Allen has been instructed to work up to a goal of 150 minutes of combined cardio and strengthening exercise per week for weight loss  and overall health benefits. We discussed the following Behavioral Modification Strategies today: increasing lean protein intake, increasing vegetables and work on meal planning and easy cooking plans, keeping healthy foods in the home, and planning for success   Kendra Allen has agreed to follow up with our clinic in 2 weeks. She was informed of the importance of frequent follow up visits to maximize her success with  intensive lifestyle modifications for her multiple health conditions.  ALLERGIES: Allergies  Allergen Reactions  . Sulfamethoxazole-Trimethoprim     upset stomach    MEDICATIONS: Current Outpatient Medications on File Prior to Visit  Medication Sig Dispense Refill  . clindamycin (CLEOCIN T) 1 % external solution Apply 1 application topically as needed (hidradenitis suppurativa flare).     [name] doxycycline (VIBRAMYCIN) 100 MG capsule Take 50 mg by mouth daily.     [name] doxycycline (VIBRAMYCIN) 50 MG capsule 100 mg.     . hydrOXYzine (VISTARIL) 25 MG capsule Take 25mg s by mouth up to two times daily as needed for anxiety  1  . sertraline (ZOLOFT) 100 MG tablet Take 1 tablet (100 mg total) by mouth daily. (Patient taking differently: Take 100 mg by mouth 2 (two) times daily. ) 30 tablet 0   No current facility-administered medications on file prior to visit.     PAST MEDICAL HISTORY: Past Medical History:  Diagnosis Date  . Anemia   . Anxiety   . Chest pain   . Constipation   . Depression   . Hidradenitis suppurativa   . PCOS (polycystic ovarian syndrome)   . Vitamin D deficiency     PAST SURGICAL HISTORY: Past Surgical History:  Procedure Laterality Date  . CERVICAL POLYPECTOMY    . DILATATION & CURETTAGE/HYSTEROSCOPY WITH MYOSURE N/A 11/21/2016   Procedure: DILATATION & CURETTAGE/HYSTEROSCOPY WITH MYOSURE;  [name], MD;  Location: [facility];  Service: Gynecology;  [name];  . NO PAST SURGERIES      SOCIAL HISTORY: Social History   Tobacco Use  . Smoking status: Never Smoker  . Smokeless tobacco: Never Used  Substance Use Topics  . Alcohol use: No    Alcohol/week: 0.0 standard drinks  . Drug use: No    FAMILY HISTORY: Family History  Problem Relation Age of Onset  . Hypertension Mother   . Depression Mother   . Obesity Mother   . Sleep apnea Father   . Obesity Father     ROS: Review of Systems  Constitutional: Positive for  malaise/fatigue. Negative for weight loss.  Gastrointestinal: Negative for nausea and vomiting.  Musculoskeletal:       Negative muscle weakness    PHYSICAL EXAM: Blood pressure 103/69, pulse 72, temperature 98.1 F (36.7 C), temperature source Oral, height 5\' 6"  (1.676 m), weight 261 lb (118.4 kg), last menstrual period 02/13/2019, SpO2 97 %. Body mass index is 42.13 kg/m. Physical Exam Vitals signs reviewed.  Constitutional:      Appearance: Normal appearance. She is obese.  Cardiovascular:     Rate and Rhythm: Normal rate.     Pulses: Normal pulses.  Pulmonary:     Effort: Pulmonary effort is normal.     Breath sounds: Normal breath sounds.  Musculoskeletal: Normal range of motion.  Skin:    General: Skin is warm and dry.  Neurological:     Mental Status: She is alert and oriented to person, place, and time.  Psychiatric:        Mood and Affect: Mood normal.  Behavior: Behavior normal.     RECENT LABS AND TESTS: BMET    Component Value Date/Time   NA 142 12/11/2018 0939   K 4.3 12/11/2018 0939   CL 105 12/11/2018 0939   CO2 23 12/11/2018 0939   GLUCOSE 87 12/11/2018 0939   GLUCOSE 90 04/17/2018 1341   BUN 8 12/11/2018 0939   CREATININE 0.57 12/11/2018 0939   CALCIUM 9.5 12/11/2018 0939   GFRNONAA 132 12/11/2018 0939   GFRAA 152 12/11/2018 0939   Lab Results  Component Value Date   HGBA1C 5.7 (H) 12/11/2018   HGBA1C 5.6 04/17/2018   HGBA1C 5.5 12/22/2017   Lab Results  Component Value Date   INSULIN 51.6 (H) 12/11/2018   CBC    Component Value Date/Time   WBC 8.3 12/11/2018 0939   WBC 8.1 04/17/2018 1341   RBC 4.46 12/11/2018 0939   RBC 4.59 04/17/2018 1341   HGB 11.8 12/11/2018 0939   HCT 35.6 12/11/2018 0939   PLT 315.0 04/17/2018 1341   MCV 80 12/11/2018 0939   MCH 26.5 (L) 12/11/2018 0939   MCH 24.6 (L) 12/22/2017 1330   MCHC 33.1 12/11/2018 0939   MCHC 32.5 04/17/2018 1341   RDW 14.8 12/11/2018 0939   LYMPHSABS 2.4 12/11/2018 0939    MONOABS 0.6 04/17/2018 1341   EOSABS 0.2 12/11/2018 0939   BASOSABS 0.0 12/11/2018 0939   Iron/TIBC/Ferritin/ %Sat    Component Value Date/Time   IRON 34 (L) 04/17/2018 1341   TIBC 429 04/17/2018 1341   FERRITIN 34 04/17/2018 1341   IRONPCTSAT 8 (L) 04/17/2018 1341   Lipid Panel     Component Value Date/Time   CHOL 155 12/11/2018 0939   TRIG 89 12/11/2018 0939   HDL 46 12/11/2018 0939   CHOLHDL 3.7 12/22/2017 1330   VLDL 27 12/22/2017 1330   LDLCALC 91 12/11/2018 0939   Hepatic Function Panel     Component Value Date/Time   PROT 7.3 12/11/2018 0939   ALBUMIN 4.2 12/11/2018 0939   AST 12 12/11/2018 0939   ALT 13 12/11/2018 0939   ALKPHOS 79 12/11/2018 0939   BILITOT 0.4 12/11/2018 0939   BILIDIR 0.0 03/27/2016 1455      Component Value Date/Time   TSH 2.930 12/11/2018 0939   TSH 2.57 03/27/2016 1455   TSH 2.28 08/25/2014 1057      OBESITY BEHAVIORAL INTERVENTION VISIT  Today's visit was # 5   Starting weight: 271 lbs Starting date: 12/11/2018 Today's weight : 261 lbs Today's date: 02/17/2019 Total lbs lost to date: 10    ASK: We discussed the diagnosis of obesity with [name] today and Chella agreed to give [place] permission to discuss obesity behavioral modification therapy today.  ASSESS: Antoria has the diagnosis of obesity and her BMI today is 42.15 Christionna is in the action stage of change   ADVISE: Kaity was educated on the multiple health risks of obesity as well as the benefit of weight loss to improve her health. She was advised of the need for long term treatment and the importance of lifestyle modifications to improve her current health and to decrease her risk of future health problems.  AGREE: Multiple dietary modification options and treatment options were discussed and  Dezaria agreed to follow the recommendations documented in the above note.  ARRANGE: Kimana was educated on the importance of frequent visits to treat  obesity as outlined per CMS and USPSTF guidelines and agreed to schedule her next follow up appointment today.  Kendra Allen, Kendra Allen, am acting as transcriptionist for Ilene Qua, MD  Kendra Allen have reviewed the above documentation for accuracy and completeness, and Kendra Allen agree with the above. - Ilene Qua, MD

## 2019-02-22 ENCOUNTER — Other Ambulatory Visit (INDEPENDENT_AMBULATORY_CARE_PROVIDER_SITE_OTHER): Payer: Self-pay | Admitting: Family Medicine

## 2019-02-22 DIAGNOSIS — E559 Vitamin D deficiency, unspecified: Secondary | ICD-10-CM

## 2019-02-25 NOTE — Progress Notes (Signed)
Office: [phone]  /  Fax: [phone]    Date: March 03, 2019   Appointment Start Time: 8:33am Duration: 30 minutes Provider: Glennie Isle, Psy.D. Type of Session: Individual Therapy  Location of Patient: Home Location of Provider: Provider's Home Type of Contact: Telepsychological Visit via [employer] Web[employer]   Session Content: Kendra Allen is a 23 y.o. female presenting via [place] for a follow-up appointment to address the previously established treatment goal of decreasing emotional eating. Of note, this provider called Kendra Allen at 8:32am as she did not present for the [facility] appointment. She noted, "I'm struggling to find the e-mail." Thus, the e-mail with the secure link was re-sent. As such, today's appointment was initiated 3 minutes late. Today's appointment was a telepsychological visit, as this provider's clinic is seeing a limited number of patients for in-person visits due to COVID-19. Therapeutic services will resume to in-person appointments once deemed appropriate. Kendra Allen expressed understanding regarding the rationale for telepsychological services, and provided verbal consent for today's appointment. Prior to proceeding with today's appointment, Kendra Allen's physical location at the time of this appointment was obtained. Kendra Allen reported she was at home and provided the address. In the event of technical difficulties, Kendra Allen shared a phone number she could be reached at. Kendra Allen and this provider participated in today's telepsychological service. Also, Kendra Allen denied anyone else being present in the room or on the WebEx appointment.  This provider conducted a brief check-in and verbally administered the PHQ-9 and GAD-7. Kendra Allen shared she "tried the mindfulness twice." She acknowledged decreased motivation with following the meal plan. As such, today's appointment focused on values to assist with the concern about motivation. Psychoeducation regarding values was provided  and Kendra Allen's values were explored using various hypothetical scenarios (Imagine it's your birthday 15 years from now. Your friends and family give speeches about your life. What do you think they say?; If you spoke to a stranger for five minutes at the grocery store, how would they describe you?; A genie in a bottle appears and grants you three wishes. What do you wish for?; What does "success" mean to you? How does society define "success"?) The following values were identified: humor, personal growth, helping, and loving and being loved. She organized the four in the order of importance (I.e., personal growth, being loved, helping, humor, and loving). She reflected, "I think these things could be contributing to my lack of motivation." This provider explored which values Kendra Allen is currently living a life congruent to and which she is not. Kendra Allen noted, "Still be caring and loving." This provider provided psychoeducation regarding SMART goals (I.e., specific, measurable, attainable, realistic, and timely). She was encouraged to establish a minimum of three short-term SMART goals congruent to her values that she has not recently focused on. She agreed. Kendra Allen was receptive to today's session as evidenced by openness to sharing, responsiveness to feedback, and willingness to identify triggers for emotional eating.  Mental Status Examination:  Appearance: neat Behavior: cooperative Mood: euthymic Affect: mood congruent Speech: normal in rate, volume, and tone Eye Contact: appropriate Psychomotor Activity: appropriate Thought Process: linear, logical, and goal directed  Content/Perceptual Disturbances: no hallucinations, delusions, bizarre thinking or behavior reported or observed and no evidence of suicidal and homicidal ideation, plan, and intent Orientation: time, person, place and purpose of appointment Cognition/Sensorium: memory, attention, language, and fund of knowledge intact  Insight:  fair Judgment: fair  Structured Assessment Results: The Patient Health Questionnaire-9 (PHQ-9) is a self-report measure that assesses symptoms and severity of depression over  the course of the last two weeks. Kendra Allen obtained a score of 3 suggesting minimal depression. Kendra Allen finds the endorsed symptoms to be somewhat difficult. Little interest or pleasure in doing things 0  Feeling down, depressed, or hopeless 0  Trouble falling or staying asleep, or sleeping too much 2  Feeling tired or having little energy 1  Poor appetite or overeating 0  Feeling bad about yourself --- or that you are a failure or have let yourself or your family down 0  Trouble concentrating on things, such as reading the newspaper or watching television 0  Moving or speaking so slowly that other people could have noticed? Or the opposite --- being so fidgety or restless that you have been moving around a lot more than usual 0  Thoughts that you would be better off dead or hurting yourself in some way 0  PHQ-9 Score 3    The Generalized Anxiety Disorder-7 (GAD-7) is a brief self-report measure that assesses symptoms of anxiety over the course of the last two weeks. Kendra Allen obtained a score of 0. Feeling nervous, anxious, on edge 0  Not being able to stop or control worrying 0  Worrying too much about different things 0  Trouble relaxing 0  Being so restless that it's hard to sit still 0  Becoming easily annoyed or irritable 0  Feeling afraid as if something awful might happen 0  GAD-7 Score 0   Interventions:  Conducted a brief chart review Verbal administration of PHQ-9 and GAD-7 for symptom monitoring Provided empathic reflections and validation Employed supportive psychotherapy interventions to facilitate reduced distress, and to improve coping skills with identified stressors Psychoeducation provided regarding values Engaged patient in exploration of values Psychoeducation provided regarding SMART goals   DSM-5 Diagnosis: 311 (F32.8) Other Specified Depressive Disorder, Emotional Eating Behaviors  Treatment Goal & Progress: During the initial appointment with this provider, the following treatment goal was established: decrease emotional eating. Kendra Allen has demonstrated some progress in her goal as evidenced by increased awareness of hunger patterns and triggers for emotional eating. Despite decreased motivation resulting in deviations from the meal plan, Kendra Allen continues to demonstrate willingness to engage in learned skills.   Plan: Kendra Allen continues to appear able and willing to participate as evidenced by engagement in reciprocal conversation, and asking questions for clarification as appropriate. The next appointment will be scheduled in three weeks, which will be via [employer]. The next session will focus on reviewing values and further on mindfulness, as it was not addressed today based on Alzena's presenting concerns.

## 2019-03-03 ENCOUNTER — Ambulatory Visit (INDEPENDENT_AMBULATORY_CARE_PROVIDER_SITE_OTHER): Payer: BC Managed Care – PPO | Admitting: Psychology

## 2019-03-03 ENCOUNTER — Other Ambulatory Visit: Payer: Self-pay

## 2019-03-03 DIAGNOSIS — F3289 Other specified depressive episodes: Secondary | ICD-10-CM | POA: Diagnosis not present

## 2019-03-06 ENCOUNTER — Ambulatory Visit (INDEPENDENT_AMBULATORY_CARE_PROVIDER_SITE_OTHER): Payer: BC Managed Care – PPO | Admitting: Family Medicine

## 2019-03-10 NOTE — Progress Notes (Signed)
Office: [phone]  /  Fax: [phone]    Date: March 24, 2019   Appointment Start Time: 8:30am Duration: 22 minutes Provider: Glennie Isle, Psy.D. Type of Session: Individual Therapy  Location of Patient: Home Location of Provider: Provider's Home Type of Contact: Telepsychological Visit via [employer] WebEx   Session Content: Cardin is a 23 y.o. female presenting via [place] for a follow-up appointment to address the previously established treatment goal of decreasing emotional eating. Today's appointment was a telepsychological visit, as it is an option for appointments to reduce exposure to COVID-19. Illa expressed understanding regarding the rationale for telepsychological services, and provided verbal consent for today's appointment. Prior to proceeding with today's appointment, Chi's physical location at the time of this appointment was obtained. Felecia reported she was at home and provided the address. In the event of technical difficulties, Dynah shared a phone number she could be reached at. Adelene and this provider participated in today's telepsychological service. Also, Manali denied anyone else being present in the room or on the WebEx appointment.  This provider conducted a brief check-in and verbally administered the PHQ-9 and GAD-7. Annamae noted, "I think I'm going to stop the program and do Weight Watchers. Financially it'll be a lost easier." This was explored further. She noted she signed up and she plans to start next week. She also plans to inform Dr. Adair Patter via [place] of her decision to leave the program. In addition, Liliann shared she had a panic attack last week due to feeling "overwhelmed." She noted she focused on breathing and took hydroxyzine to assist with coping. She also described experiencing interpersonal conflict. She further described feeling hopeless about "this phase in life." Dayle described feeling "stuck" and denied  experiencing suicidal ideation, plan, and intent. Based on the aforementioned, this provider discussed options to establish care with a new provider, including this provider placing a referral. She declined a referral and noted she will ask her psychiatrist if needed for a referral. Regarding mindfulness, Mirielle noted engaging in mindful walks. She described feeling "more refreshed." Positive reinforcement was provided. Psychoeducation regarding formal (e.g., setting aside a specific time daily to engage in an exercise) and informal (e.g., cultivating awareness in the present moment and taking a non-judgmental approach while engaging in day-to-day tasks) mindfulness was provided. This provider also discussed the utilization of YouTube for mindfulness exercises (e.g., videos by Merri Ray). Furthermore, Kamarin was led through an exercise focusing on her breath as an anchor. Her experience after was processed. Doristine shared, "It was very calming. I've done that exercise before." Tanessa provided verbal consent during today's appointment for this provider to send the handout for today's exercise via e-mail. Overall, Shabreka was receptive to today's session as evidenced by openness to sharing, responsiveness to feedback, and willingness to continue engaging in mindfulness exercises.  Mental Status Examination:  Appearance: neat Behavior: cooperative Mood: euthymic Affect: mood congruent Speech: normal in rate, volume, and tone Eye Contact: appropriate Psychomotor Activity: appropriate Thought Process: linear, logical, and goal directed  Content/Perceptual Disturbances: no hallucinations, delusions, bizarre thinking or behavior reported or observed and no evidence of suicidal and homicidal ideation, plan, and intent Orientation: time, person, place and purpose of appointment Cognition/Sensorium: memory, attention, language, and fund of knowledge intact  Insight: good Judgment: good  Structured  Assessment Results: The Patient Health Questionnaire-9 (PHQ-9) is a self-report measure that assesses symptoms and severity of depression over the course of the last two weeks. Navy obtained a score of 3 suggesting minimal  depression. Mei finds the endorsed symptoms to be very difficult. Little interest or pleasure in doing things 1  Feeling down, depressed, or hopeless 1  Trouble falling or staying asleep, or sleeping too much 0  Feeling tired or having little energy 0  Poor appetite or overeating 0  Feeling bad about yourself --- or that you are a failure or have let yourself or your family down 1  Trouble concentrating on things, such as reading the newspaper or watching television 0  Moving or speaking so slowly that other people could have noticed? Or the opposite --- being so fidgety or restless that you have been moving around a lot more than usual 0  Thoughts that you would be better off dead or hurting yourself in some way 0  PHQ-9 Score 3    The Generalized Anxiety Disorder-7 (GAD-7) is a brief self-report measure that assesses symptoms of anxiety over the course of the last two weeks. Jassica obtained a score of 4 suggesting minimal anxiety. Patiance finds the endorsed symptoms to be very difficult. Feeling nervous, anxious, on edge 1  Not being able to stop or control worrying 1  Worrying too much about different things 1  Trouble relaxing 1  Being so restless that it's hard to sit still 0  Becoming easily annoyed or irritable 0  Feeling afraid as if something awful might happen 0  GAD-7 Score 4   Interventions:  Conducted a brief chart review Verbal administration of PHQ-9 and GAD-7 for symptom monitoring Provided empathic reflections and validation Reviewed content from the previous session Engaged patient in a mindfulness exercise Provided positive reinforcement Employed supportive psychotherapy interventions to facilitate reduced distress, and to improve coping  skills with identified stressors Employed acceptance and commitment interventions to emphasize mindfulness and acceptance without struggle  DSM-5 Diagnosis: 311 (F32.8) Other Specified Depressive Disorder, Emotional Eating Behaviors  Treatment Goal & Progress: During the initial appointment with this provider, the following treatment goal was established: decrease emotional eating. Teisha has demonstrated progress in her goal as evidenced by increased awareness of hunger patterns and triggers for emotional eating. Ashantee also demonstrates willingness to engage in mindfulness exercises.  Plan: Seniah declined future appointments with this provider as she is leaving the program. She acknowledged understanding that she may request a follow-up appointment with this provider in the future if she chooses to continue with the program or re-initiates services with the program. No further follow-up planned by this provider.

## 2019-03-13 ENCOUNTER — Ambulatory Visit (INDEPENDENT_AMBULATORY_CARE_PROVIDER_SITE_OTHER): Payer: BC Managed Care – PPO | Admitting: Family Medicine

## 2019-03-24 ENCOUNTER — Other Ambulatory Visit: Payer: Self-pay

## 2019-03-24 ENCOUNTER — Ambulatory Visit (INDEPENDENT_AMBULATORY_CARE_PROVIDER_SITE_OTHER): Payer: BC Managed Care – PPO | Admitting: Psychology

## 2019-03-24 DIAGNOSIS — F3289 Other specified depressive episodes: Secondary | ICD-10-CM

## 2019-03-27 ENCOUNTER — Encounter (INDEPENDENT_AMBULATORY_CARE_PROVIDER_SITE_OTHER): Payer: Self-pay | Admitting: Family Medicine

## 2019-03-27 ENCOUNTER — Other Ambulatory Visit (INDEPENDENT_AMBULATORY_CARE_PROVIDER_SITE_OTHER): Payer: Self-pay | Admitting: Family Medicine

## 2019-03-27 DIAGNOSIS — E559 Vitamin D deficiency, unspecified: Secondary | ICD-10-CM

## 2019-03-27 NOTE — Telephone Encounter (Signed)
FYI

## 2019-03-31 ENCOUNTER — Ambulatory Visit (INDEPENDENT_AMBULATORY_CARE_PROVIDER_SITE_OTHER): Payer: BC Managed Care – PPO | Admitting: Internal Medicine

## 2019-03-31 ENCOUNTER — Encounter: Payer: Self-pay | Admitting: Internal Medicine

## 2019-03-31 ENCOUNTER — Other Ambulatory Visit (INDEPENDENT_AMBULATORY_CARE_PROVIDER_SITE_OTHER): Payer: BC Managed Care – PPO

## 2019-03-31 ENCOUNTER — Other Ambulatory Visit: Payer: Self-pay

## 2019-03-31 VITALS — BP 110/74 | HR 77 | Temp 98.8°F | Ht 66.0 in | Wt 267.0 lb

## 2019-03-31 DIAGNOSIS — F419 Anxiety disorder, unspecified: Secondary | ICD-10-CM | POA: Diagnosis not present

## 2019-03-31 DIAGNOSIS — R739 Hyperglycemia, unspecified: Secondary | ICD-10-CM

## 2019-03-31 DIAGNOSIS — E559 Vitamin D deficiency, unspecified: Secondary | ICD-10-CM

## 2019-03-31 DIAGNOSIS — D509 Iron deficiency anemia, unspecified: Secondary | ICD-10-CM

## 2019-03-31 DIAGNOSIS — Z6839 Body mass index (BMI) 39.0-39.9, adult: Secondary | ICD-10-CM

## 2019-03-31 DIAGNOSIS — E669 Obesity, unspecified: Secondary | ICD-10-CM

## 2019-03-31 DIAGNOSIS — F32A Depression, unspecified: Secondary | ICD-10-CM

## 2019-03-31 DIAGNOSIS — F329 Major depressive disorder, single episode, unspecified: Secondary | ICD-10-CM

## 2019-03-31 LAB — CBC WITH DIFFERENTIAL/PLATELET
Basophils Absolute: 0.1 10*3/uL (ref 0.0–0.1)
Basophils Relative: 0.7 % (ref 0.0–3.0)
Eosinophils Absolute: 0.2 10*3/uL (ref 0.0–0.7)
Eosinophils Relative: 2.3 % (ref 0.0–5.0)
HCT: 36.1 % (ref 36.0–46.0)
Hemoglobin: 11.6 g/dL — ABNORMAL LOW (ref 12.0–15.0)
Lymphocytes Relative: 27.2 % (ref 12.0–46.0)
Lymphs Abs: 2.2 10*3/uL (ref 0.7–4.0)
MCHC: 32.1 g/dL (ref 30.0–36.0)
MCV: 78.1 fl (ref 78.0–100.0)
Monocytes Absolute: 0.5 10*3/uL (ref 0.1–1.0)
Monocytes Relative: 6 % (ref 3.0–12.0)
Neutro Abs: 5.1 10*3/uL (ref 1.4–7.7)
Neutrophils Relative %: 63.8 % (ref 43.0–77.0)
Platelets: 341 10*3/uL (ref 150.0–400.0)
RBC: 4.62 Mil/uL (ref 3.87–5.11)
RDW: 16.2 % — ABNORMAL HIGH (ref 11.5–15.5)
WBC: 8 10*3/uL (ref 4.0–10.5)

## 2019-03-31 LAB — HEMOGLOBIN A1C: Hgb A1c MFr Bld: 5.9 % (ref 4.6–6.5)

## 2019-03-31 LAB — BASIC METABOLIC PANEL
BUN: 8 mg/dL (ref 6–23)
CO2: 26 mEq/L (ref 19–32)
Calcium: 9.5 mg/dL (ref 8.4–10.5)
Chloride: 104 mEq/L (ref 96–112)
Creatinine, Ser: 0.58 mg/dL (ref 0.40–1.20)
GFR: 156.34 mL/min (ref >60.00)
Glucose, Bld: 90 mg/dL (ref 70–99)
Potassium: 3.9 mEq/L (ref 3.5–5.1)
Sodium: 138 mEq/L (ref 135–145)

## 2019-03-31 LAB — VITAMIN D 25 HYDROXY (VIT D DEFICIENCY, FRACTURES): VITD: 19.99 ng/mL — ABNORMAL LOW (ref 30.00–100.00)

## 2019-03-31 NOTE — Assessment & Plan Note (Signed)
Labs Vit D 

## 2019-03-31 NOTE — Assessment & Plan Note (Signed)
Diet, fasting discussed

## 2019-03-31 NOTE — Patient Instructions (Addendum)
These suggestions will probably help you to improve your metabolism if you are not overweight and to lose weight if you are overweight: 1.  Reduce your consumption of sugars and starches.  Eliminate high fructose corn syrup from your diet.  Reduce your consumption of processed foods.  For desserts try to have seasonal fruits, berries, nuts, cheeses or dark chocolate with more than 70% cacao. 2.  Do not snack 3.  You do not have to eat breakfast.  If you choose to have breakfast-eat plain greek yogurt, eggs, oatmeal (without sugar) 4.  Drink water, freshly brewed unsweetened tea (green, black or herbal) or coffee.  Do not drink sodas including diet sodas , juices, beverages sweetened with artificial sweeteners. 5.  Reduce your consumption of refined grains. 6.  Avoid protein drinks such as Optifast, Slim fast etc. Eat chicken, fish, meat, dairy and beans for your sources of protein 7.  Natural unprocessed fats like cold pressed virgin olive oil, butter, coconut oil are good for you.  Eat avocados 8.  Increase your consumption of fiber.  Fruits, berries, vegetables, whole grains, flaxseeds, Chia seeds, beans, popcorn, nuts, oatmeal are good sources of fiber 9.  Use vinegar in your diet, i.e. apple cider vinegar, red wine or balsamic vinegar 10.  You can try fasting.  For example you can skip breakfast and lunch every other day (24-hour fast) 11.  Stress reduction, good night sleep, relaxation, meditation, yoga and other physical activity is likely to help you to maintain low weight too. 12.  If you drink alcohol, limit your alcohol intake to no more than 2 drinks a day.   Mediterranean diet is good for you. ([employer] has a typical Mediterranean cuisine menu) The Mediterranean diet is a way of eating based on the traditional cuisine of countries bordering the [place] Sea. While there is no single definition of the Mediterranean diet, it is typically high in vegetables, fruits, whole grains,  beans, nut and seeds, and olive oil. The main components of Mediterranean diet include: . Daily consumption of vegetables, fruits, whole grains and healthy fats  . Weekly intake of fish, poultry, beans and eggs  . Moderate portions of dairy products  . Limited intake of red meat Other important elements of the Mediterranean diet are sharing meals with family and friends, enjoying a glass of red wine and being physically active. Health benefits of a Mediterranean diet: A traditional Mediterranean diet consisting of large quantities of fresh fruits and vegetables, nuts, fish and olive oil-coupled with physical activity-can reduce your risk of serious mental and physical health problems by: Preventing heart disease and strokes. Following a Mediterranean diet limits your intake of refined breads, processed foods, and red meat, and encourages drinking red wine instead of hard liquor-all factors that can help prevent heart disease and stroke. Keeping you agile. If you're an older adult, the nutrients gained with a Mediterranean diet may reduce your risk of developing muscle weakness and other signs of frailty by about 70 percent. Reducing the risk of Alzheimer's. Research suggests that the Mediterranean diet may improve cholesterol, blood sugar levels, and overall blood vessel health, which in turn may reduce your risk of Alzheimer's disease or dementia. Halving the risk of Parkinson's disease. The high levels of antioxidants in the Mediterranean diet can prevent cells from undergoing a damaging process called oxidative stress, thereby cutting the risk of Parkinson's disease in half. Increasing longevity. By reducing your risk of developing heart disease or cancer with the Mediterranean diet,   you're reducing your risk of death at any age by 20%. Protecting against type 2 diabetes. A Mediterranean diet is rich in fiber which digests slowly, prevents huge swings in blood sugar, and can help you maintain a  healthy weight.    Cabbage soup recipe that will not make you gain weight: Take 1 small head of cabbage, 1 average pack of celery, 4 green peppers, 4 onions, 2 cans diced tomatoes (they are not available without salt), salt and spices to taste.  Chop cabbage, celery, peppers and onions.  And tomatoes and 2-2.5 liters (2.5 quarts) of water so that it would just cover the vegetables.  Bring to boil.  Add spices and salt.  Turn heat to low/medium and simmer for 20-25 minutes.  Naturally, you can make a smaller batch and change some of the ingredients.    Align probiotic x 1 month

## 2019-03-31 NOTE — Progress Notes (Signed)
Subjective:  Patient ID: Kendra Allen, female    DOB: [birth year]  Age: 23 y.o. MRN: [id number]  CC: No chief complaint on file.   HPI Kendra Allen presents for low vit D, depression, anxiety f/u Her glucose was up  Outpatient Medications Prior to Visit  Medication Sig Dispense Refill  . clindamycin (CLEOCIN T) 1 % external solution Apply 1 application topically as needed (hidradenitis suppurativa flare).     [name] doxycycline (VIBRAMYCIN) 50 MG capsule 100 mg.     . hydrOXYzine (VISTARIL) 25 MG capsule Take 25mg s by mouth up to two times daily as needed for anxiety  1  . sertraline (ZOLOFT) 100 MG tablet Take 1 tablet (100 mg total) by mouth daily. (Patient taking differently: Take 100 mg by mouth 2 (two) times daily. ) 30 tablet 0  . Vitamin D, Ergocalciferol, (DRISDOL) 1.25 MG (50000 UT) CAPS capsule Take 1 capsule (50,000 Units total) by mouth every 7 (seven) days. 4 capsule 0  . doxycycline (VIBRAMYCIN) 100 MG capsule Take 50 mg by mouth daily.      No facility-administered medications prior to visit.     ROS: Review of Systems  Constitutional: Positive for unexpected weight change. Negative for activity change, appetite change, chills and fatigue.  HENT: Negative for congestion, mouth sores and sinus pressure.   Eyes: Negative for visual disturbance.  Respiratory: Negative for cough and chest tightness.   Gastrointestinal: Negative for abdominal pain and nausea.  Genitourinary: Negative for difficulty urinating, frequency and vaginal pain.  Musculoskeletal: Negative for back pain and gait problem.  Skin: Negative for pallor and rash.  Neurological: Negative for dizziness, tremors, weakness, numbness and headaches.  Psychiatric/Behavioral: Negative for confusion, sleep disturbance and suicidal ideas. The patient is nervous/anxious.     Objective:  BP 110/74 (BP Location: Left Arm, Patient Position: Sitting, Cuff Size: Large)   Pulse 77   Temp 98.8 F (37.1 C) (Oral)    Ht 5\' 6"  (1.676 m)   Wt 267 lb (121.1 kg)   SpO2 97%   BMI 43.09 kg/m   BP Readings from Last 3 Encounters:  03/31/19 110/74  02/17/19 103/69  01/30/19 90/63    Wt Readings from Last 3 Encounters:  03/31/19 267 lb (121.1 kg)  02/17/19 261 lb (118.4 kg)  01/30/19 257 lb (116.6 kg)    Physical Exam Constitutional:      General: She is not in acute distress.    Appearance: She is well-developed.  HENT:     Head: Normocephalic.     Right Ear: External ear normal.     Left Ear: External ear normal.     Nose: Nose normal.  Eyes:     General:        Right eye: No discharge.        Left eye: No discharge.     Conjunctiva/sclera: Conjunctivae normal.     Pupils: Pupils are equal, round, and reactive to light.  Neck:     Musculoskeletal: Normal range of motion and neck supple.     Thyroid: No thyromegaly.     Vascular: No JVD.     Trachea: No tracheal deviation.  Cardiovascular:     Rate and Rhythm: Normal rate and regular rhythm.     Heart sounds: Normal heart sounds.  Pulmonary:     Effort: No respiratory distress.     Breath sounds: No stridor. No wheezing.  Abdominal:     General: Bowel sounds are normal. There is no  distension.     Palpations: Abdomen is soft. There is no mass.     Tenderness: There is no abdominal tenderness. There is no guarding or rebound.  Musculoskeletal:        General: No tenderness.  Lymphadenopathy:     Cervical: No cervical adenopathy.  Skin:    Findings: No erythema or rash.  Neurological:     Mental Status: She is oriented to person, place, and time.     Cranial Nerves: No cranial nerve deficit.     Motor: No abnormal muscle tone.     Coordination: Coordination normal.     Deep Tendon Reflexes: Reflexes normal.  Psychiatric:        Behavior: Behavior normal.        Thought Content: Thought content normal.        Judgment: Judgment normal.     Lab Results  Component Value Date   WBC 8.3 12/11/2018   HGB 11.8 12/11/2018    HCT 35.6 12/11/2018   PLT 315.0 04/17/2018   GLUCOSE 87 12/11/2018   CHOL 155 12/11/2018   TRIG 89 12/11/2018   HDL 46 12/11/2018   LDLCALC 91 12/11/2018   ALT 13 12/11/2018   AST 12 12/11/2018   NA 142 12/11/2018   K 4.3 12/11/2018   CL 105 12/11/2018   CREATININE 0.57 12/11/2018   BUN 8 12/11/2018   CO2 23 12/11/2018   TSH 2.930 12/11/2018   HGBA1C 5.7 (H) 12/11/2018    No results found.  Assessment & Plan:   There are no diagnoses linked to this encounter.   No orders of the defined types were placed in this encounter.    Follow-up: No follow-ups on file.  Kendra Allen

## 2019-03-31 NOTE — Assessment & Plan Note (Signed)
Labs

## 2019-03-31 NOTE — Assessment & Plan Note (Signed)
Zoloft

## 2019-04-01 ENCOUNTER — Other Ambulatory Visit: Payer: Self-pay | Admitting: Internal Medicine

## 2019-04-01 DIAGNOSIS — E559 Vitamin D deficiency, unspecified: Secondary | ICD-10-CM

## 2019-04-01 LAB — IRON,TIBC AND FERRITIN PANEL
%SAT: 10 % (calc) — ABNORMAL LOW (ref 16–45)
Ferritin: 53 ng/mL (ref 16–154)
Iron: 36 ug/dL — ABNORMAL LOW (ref 40–190)
TIBC: 369 mcg/dL (calc) (ref 250–450)

## 2019-04-01 MED ORDER — VITAMIN D3 50 MCG (2000 UT) PO CAPS: 2000.0000 [IU] | ORAL_CAPSULE | Freq: Every day | ORAL | 3 refills | Status: DC

## 2019-04-01 MED ORDER — VITAMIN D (ERGOCALCIFEROL) 1.25 MG (50000 UNIT) PO CAPS: 50000.0000 [IU] | ORAL_CAPSULE | ORAL | 0 refills | Status: DC

## 2019-04-24 ENCOUNTER — Ambulatory Visit (INDEPENDENT_AMBULATORY_CARE_PROVIDER_SITE_OTHER): Payer: BC Managed Care – PPO | Admitting: Internal Medicine

## 2019-04-24 ENCOUNTER — Other Ambulatory Visit: Payer: Self-pay

## 2019-04-24 ENCOUNTER — Encounter: Payer: Self-pay | Admitting: Internal Medicine

## 2019-04-24 ENCOUNTER — Other Ambulatory Visit: Payer: Self-pay | Admitting: Internal Medicine

## 2019-04-24 VITALS — BP 116/72 | HR 77 | Temp 97.7°F | Ht 66.0 in | Wt 275.0 lb

## 2019-04-24 DIAGNOSIS — L732 Hidradenitis suppurativa: Secondary | ICD-10-CM

## 2019-04-24 DIAGNOSIS — E559 Vitamin D deficiency, unspecified: Secondary | ICD-10-CM

## 2019-04-24 DIAGNOSIS — F410 Panic disorder [episodic paroxysmal anxiety] without agoraphobia: Secondary | ICD-10-CM | POA: Diagnosis not present

## 2019-04-24 MED ORDER — MUPIROCIN 2 % EX OINT: TOPICAL_OINTMENT | CUTANEOUS | 0 refills | Status: DC

## 2019-04-24 MED ORDER — DOXYCYCLINE HYCLATE 50 MG PO CAPS: 100.0000 mg | ORAL_CAPSULE | Freq: Two times a day (BID) | ORAL | 0 refills | Status: DC

## 2019-04-24 MED ORDER — CHLORHEXIDINE GLUCONATE 4 % EX LIQD: Freq: Every day | CUTANEOUS | 1 refills | Status: DC | PRN

## 2019-04-24 MED ORDER — SERTRALINE HCL 100 MG PO TABS: 100.0000 mg | ORAL_TABLET | Freq: Every day | ORAL | 1 refills | Status: DC

## 2019-04-24 NOTE — Assessment & Plan Note (Signed)
Worse - Doxy bid Bactroban, Hibiclens

## 2019-04-24 NOTE — Assessment & Plan Note (Signed)
Zoloft

## 2019-04-24 NOTE — Assessment & Plan Note (Signed)
Vit D 

## 2019-04-24 NOTE — Progress Notes (Signed)
Subjective:  Patient ID: Kendra Allen, female    DOB: June 23, 1995  Age: 23 y.o. MRN: [id number]  CC: No chief complaint on file.   HPI Kendra Allen presents for hydradenitis flare up B axillae She used to see Dr Tonia Brooms  F/u depression   Outpatient Medications Prior to Visit  Medication Sig Dispense Refill  . Cholecalciferol (VITAMIN D3) 50 MCG (2000 UT) capsule Take 1 capsule (2,000 Units total) by mouth daily. 100 capsule 3  . clindamycin (CLEOCIN T) 1 % external solution Apply 1 application topically as needed (hidradenitis suppurativa flare).     [name] doxycycline (VIBRAMYCIN) 50 MG capsule 100 mg.     . hydrOXYzine (VISTARIL) 25 MG capsule Take 25mg s by mouth up to two times daily as needed for anxiety  1  . sertraline (ZOLOFT) 100 MG tablet Take 1 tablet (100 mg total) by mouth daily. (Patient taking differently: Take 100 mg by mouth 2 (two) times daily. ) 30 tablet 0  . Vitamin D, Ergocalciferol, (DRISDOL) 1.25 MG (50000 UT) CAPS capsule Take 1 capsule (50,000 Units total) by mouth every 7 (seven) days. 8 capsule 0   No facility-administered medications prior to visit.     ROS: Review of Systems  Constitutional: Negative for activity change, appetite change, chills, fatigue and unexpected weight change.  HENT: Negative for congestion, mouth sores and sinus pressure.   Eyes: Negative for visual disturbance.  Respiratory: Negative for cough and chest tightness.   Gastrointestinal: Negative for abdominal pain and nausea.  Genitourinary: Negative for difficulty urinating, frequency and vaginal pain.  Musculoskeletal: Negative for back pain and gait problem.  Skin: Negative for pallor and rash.  Neurological: Negative for dizziness, tremors, weakness, numbness and headaches.  Psychiatric/Behavioral: Negative for confusion, sleep disturbance and suicidal ideas.    Objective:  BP 116/72 (BP Location: Left Arm, Patient Position: Sitting, Cuff Size: Large)   Pulse 77   Temp  97.7 F (36.5 C) (Oral)   Ht 5\' 6"  (1.676 m)   Wt 275 lb (124.7 kg)   SpO2 97%   BMI 44.39 kg/m   BP Readings from Last 3 Encounters:  04/24/19 116/72  03/31/19 110/74  02/17/19 103/69    Wt Readings from Last 3 Encounters:  04/24/19 275 lb (124.7 kg)  03/31/19 267 lb (121.1 kg)  02/17/19 261 lb (118.4 kg)    Physical Exam Constitutional:      General: She is not in acute distress.    Appearance: She is well-developed. She is obese.  HENT:     Head: Normocephalic.     Right Ear: External ear normal.     Left Ear: External ear normal.     Nose: Nose normal.  Eyes:     General:        Right eye: No discharge.        Left eye: No discharge.     Conjunctiva/sclera: Conjunctivae normal.     Pupils: Pupils are equal, round, and reactive to light.  Neck:     Musculoskeletal: Normal range of motion and neck supple.     Thyroid: No thyromegaly.     Vascular: No JVD.     Trachea: No tracheal deviation.  Cardiovascular:     Rate and Rhythm: Normal rate and regular rhythm.     Heart sounds: Normal heart sounds.  Pulmonary:     Effort: No respiratory distress.     Breath sounds: No stridor. No wheezing.  Abdominal:     General:  Bowel sounds are normal. There is no distension.     Palpations: Abdomen is soft. There is no mass.     Tenderness: There is no abdominal tenderness. There is no guarding or rebound.  Musculoskeletal:        General: Tenderness present.  Lymphadenopathy:     Cervical: No cervical adenopathy.  Skin:    Findings: Lesion present. No erythema or rash.  Neurological:     Cranial Nerves: No cranial nerve deficit.     Motor: No abnormal muscle tone.     Coordination: Coordination normal.     Deep Tendon Reflexes: Reflexes normal.  Psychiatric:        Behavior: Behavior normal.        Thought Content: Thought content normal.        Judgment: Judgment normal.   There are subcutaneous nodules in both axillas, soft with open tracks to the skin surface   Lab Results  Component Value Date   WBC 8.0 03/31/2019   HGB 11.6 (L) 03/31/2019   HCT 36.1 03/31/2019   PLT 341.0 03/31/2019   GLUCOSE 90 03/31/2019   CHOL 155 12/11/2018   TRIG 89 12/11/2018   HDL 46 12/11/2018   LDLCALC 91 12/11/2018   ALT 13 12/11/2018   AST 12 12/11/2018   NA 138 03/31/2019   K 3.9 03/31/2019   CL 104 03/31/2019   CREATININE 0.58 03/31/2019   BUN 8 03/31/2019   CO2 26 03/31/2019   TSH 2.930 12/11/2018   HGBA1C 5.9 03/31/2019    No results found.  Assessment & Plan:   There are no diagnoses linked to this encounter.   No orders of the defined types were placed in this encounter.    Follow-up: No follow-ups on file.  Walker Kehr, MD

## 2019-06-23 ENCOUNTER — Ambulatory Visit: Payer: BC Managed Care – PPO | Attending: Internal Medicine

## 2019-06-23 DIAGNOSIS — Z20822 Contact with and (suspected) exposure to covid-19: Secondary | ICD-10-CM

## 2019-06-24 LAB — NOVEL CORONAVIRUS, NAA: SARS-CoV-2, NAA: NOT DETECTED

## 2019-07-09 ENCOUNTER — Other Ambulatory Visit: Payer: Self-pay | Admitting: Internal Medicine

## 2019-07-09 DIAGNOSIS — R1013 Epigastric pain: Secondary | ICD-10-CM

## 2019-07-16 ENCOUNTER — Encounter: Payer: Self-pay | Admitting: Gastroenterology

## 2019-08-05 ENCOUNTER — Ambulatory Visit: Payer: BC Managed Care – PPO | Attending: Internal Medicine

## 2019-08-05 DIAGNOSIS — Z20822 Contact with and (suspected) exposure to covid-19: Secondary | ICD-10-CM

## 2019-08-06 LAB — NOVEL CORONAVIRUS, NAA: SARS-CoV-2, NAA: NOT DETECTED

## 2019-08-14 ENCOUNTER — Ambulatory Visit (INDEPENDENT_AMBULATORY_CARE_PROVIDER_SITE_OTHER): Payer: BC Managed Care – PPO | Admitting: Gastroenterology

## 2019-08-14 ENCOUNTER — Encounter: Payer: Self-pay | Admitting: Gastroenterology

## 2019-08-14 ENCOUNTER — Ambulatory Visit (INDEPENDENT_AMBULATORY_CARE_PROVIDER_SITE_OTHER)
Admission: RE | Admit: 2019-08-14 | Discharge: 2019-08-14 | Disposition: A | Discharge status: Home / Self Care | Payer: BC Managed Care – PPO | Source: Ambulatory Visit | Attending: Gastroenterology | Admitting: Gastroenterology

## 2019-08-14 ENCOUNTER — Other Ambulatory Visit: Payer: Self-pay

## 2019-08-14 VITALS — BP 108/78 | HR 60 | Temp 98.3°F | Ht 66.0 in | Wt 280.6 lb

## 2019-08-14 DIAGNOSIS — K582 Mixed irritable bowel syndrome: Secondary | ICD-10-CM

## 2019-08-14 DIAGNOSIS — R110 Nausea: Secondary | ICD-10-CM | POA: Diagnosis not present

## 2019-08-14 MED ORDER — ONDANSETRON HCL 4 MG PO TABS: 4.0000 mg | ORAL_TABLET | Freq: Three times a day (TID) | ORAL | 0 refills | Status: DC | PRN

## 2019-08-14 NOTE — Progress Notes (Signed)
Kendra Allen    [id number]    03-Apr-1996  Primary Care Physician:Plotnikov, Evie Lacks, MD  Referring Physician: Cassandria Anger, MD [place],  [place] [id number]   Chief complaint: Dyspepsia, alternating constipation and diarrhea, nausea HPI: 24 year old female here for new patient visit accompanied by her mother with complaints of recent change in bowel habits in the past 6 months with alternating constipation and diarrhea and also has intermittent nausea. She is having intermittent dyspepsia and indigestion worse when she eats rich foods like steak or desserts or raw vegetables.  Maternal uncle has colon cancer  Gained 15 lbs in the past year  Denies any melena or blood per rectum.  She has increased regular menstrual cycle and has history of PCOS, worse since she has gained weight.  She is currently not on any laxatives or stool softeners.  No family history of IBD, Crohn's disease or celiac disease.   Outpatient Encounter Medications as of 08/14/2019  Medication Sig  . chlorhexidine (HIBICLENS) 4 % external liquid Apply topically daily as needed. Wash 2-3 times a week  . Cholecalciferol (VITAMIN D3) 50 MCG (2000 UT) capsule Take 1 capsule (2,000 Units total) by mouth daily.  . clindamycin (CLEOCIN T) 1 % external solution Apply 1 application topically as needed (hidradenitis suppurativa flare).   [name] doxycycline (VIBRAMYCIN) 50 MG capsule Take 2 capsules (100 mg total) by mouth 2 (two) times daily.  . hydrOXYzine (VISTARIL) 25 MG capsule Take 25mg s by mouth up to two times daily as needed for anxiety  . mupirocin ointment (BACTROBAN) 2 % On wound bid  . sertraline (ZOLOFT) 100 MG tablet Take 1 tablet (100 mg total) by mouth daily. (Patient taking differently: Take 200 mg by mouth daily. )  . [DISCONTINUED] Vitamin D, Ergocalciferol, (DRISDOL) 1.25 MG (50000 UT) CAPS capsule Take 1 capsule (50,000 Units total) by mouth every 7 (seven) days.   No  facility-administered encounter medications on file as of 08/14/2019.    Allergies as of 08/14/2019 - Review Complete 08/14/2019  Allergen Reaction Noted  . Sulfamethoxazole-trimethoprim  01/12/2010    Past Medical History:  Diagnosis Date  . Anemia   . Anxiety   . Chest pain   . Constipation   . Depression   . Hidradenitis suppurativa   . PCOS (polycystic ovarian syndrome)   . Vitamin D deficiency     Past Surgical History:  Procedure Laterality Date  . CERVICAL POLYPECTOMY    . DILATATION & CURETTAGE/HYSTEROSCOPY WITH MYOSURE N/A 11/21/2016   Procedure: DILATATION & CURETTAGE/HYSTEROSCOPY WITH MYOSURE;  [name], MD;  Location: [facility];  Service: Gynecology;  [name];    Family History  Problem Relation Age of Onset  . Hypertension Mother   . Depression Mother   . Obesity Mother   . Sleep apnea Father   . Obesity Father   . Breast cancer Sister   . Colon cancer Maternal Uncle   . Diabetes Paternal Aunt   . Prostate cancer Maternal Grandfather     Social History   Socioeconomic History  . Marital status: Single    Spouse name: Not on file  . Number of children: 0  . Years of education: Not on file  . Highest education level: Not on file  Occupational History  . Occupation: Scientist, water quality  Tobacco Use  . Smoking status: Never Smoker  . Smokeless tobacco: Never Used  Substance and Sexual Activity  .  Alcohol use: No    Alcohol/week: 0.0 standard drinks  . Drug use: No  . Sexual activity: Never  Other Topics Concern  . Not on file  Social History Narrative  . Not on file   Social Determinants of Health   Financial Resource Strain:   . Difficulty of Paying Living Expenses: Not on file  Food Insecurity:   . Worried About Charity fundraiser in the Last Year: Not on file  . Ran Out of Food in the Last Year: Not on file  Transportation Needs:   . Lack of Transportation (Medical): Not on file  . Lack of Transportation (Non-Medical): Not  on file  Physical Activity:   . Days of Exercise per Week: Not on file  . Minutes of Exercise per Session: Not on file  Stress:   . Feeling of Stress : Not on file  Social Connections:   . Frequency of Communication with Friends and Family: Not on file  . Frequency of Social Gatherings with Friends and Family: Not on file  . Attends Religious Services: Not on file  . Active Member of Clubs or Organizations: Not on file  . Attends Archivist Meetings: Not on file  . Marital Status: Not on file  Intimate Partner Violence:   . Fear of Current or Ex-Partner: Not on file  . Emotionally Abused: Not on file  . Physically Abused: Not on file  . Sexually Abused: Not on file      Review of systems: All other review of systems negative except as mentioned in the HPI.   Physical Exam: Vitals:   08/14/19 0905  BP: 108/78  Pulse: 60  Temp: 98.3 F (36.8 C)   Body mass index is 45.29 kg/m. Gen:      No acute distress Abd:      + bowel sounds; soft, non-tender; no palpable masses, no distension Neuro: alert and oriented x 3 Psych: normal mood and affect  Data Reviewed:  Reviewed labs, radiology imaging, old records and pertinent past GI work up   Assessment and Plan/Recommendations:  24 year old female with morbid obesity here with complaints of irregular bowel habits with alternating constipation and diarrhea.  Patient likely has irritable bowel syndrome with alternating constipation and diarrhea versus overflow diarrhea with increased stool burden Check abdominal x-ray 2-view to assess stool burden Bowel purge with MiraLAX and start MiraLAX half capful daily after the bowel purge  Nausea likely secondary to increased stool burden/IBS Use Zofran 4 mg daily as needed  If continues to have persistent symptoms will consider EGD and colonoscopy for further evaluation  Discussed dietary changes and exercise for weight loss, goal 5% weight loss in the next 3 months   Return in 6 to 8 weeks or sooner if needed  This visit required 45 minutes of patient care (this includes precharting, chart review, review of results, face-to-face time used for counseling as well as treatment plan and follow-up. The patient was provided an opportunity to ask questions and all were answered. The patient agreed with the plan and demonstrated an understanding of the instructions.  Damaris Hippo , MD    CC: Plotnikov, Evie Lacks, MD

## 2019-08-14 NOTE — Patient Instructions (Signed)
If you are age 23 or older, your body mass index should be between 23-30. Your Body mass index is 45.29 kg/m. If this is out of the aforementioned range listed, please consider follow up with your Primary Care Provider.  If you are age 41 or younger, your body mass index should be between 19-25. Your Body mass index is 45.29 kg/m. If this is out of the aformentioned range listed, please consider follow up with your Primary Care Provider.   Your provider has requested that you have an abdominal x ray before leaving today. Please go to the basement floor to our Radiology department for the test.  Dr Silverio Decamp recommends that you complete a bowel purge (to clean out your bowels).  Please do the following: Purchase a bottle of Miralax over the counter as well as a box of 5 mg dulcolax tablets. Take 4 dulcolax tablets. Wait 1 hour. You will then drink 6-8 capfuls of Miralax mixed in an adequate amount of water/juice/gatorade (you may choose which of these liquids to drink) over the next 2-3 hours. You should expect results within 1 to 6 hours after completing the bowel purge.  After the purge start 1/2 capful of Miralax a day.  It was a pleasure to see you today!  Dr.Nandigam

## 2019-08-15 ENCOUNTER — Telehealth: Payer: Self-pay | Admitting: Gastroenterology

## 2019-08-15 NOTE — Telephone Encounter (Signed)
Left message on machine to call back    Please do the following: Purchase a bottle of Miralax over the counter as well as a box of 5 mg dulcolax tablets. Take 4 dulcolax tablets. Wait 1 hour. You will then drink 6-8 capfuls of Miralax mixed in an adequate amount of water/juice/gatorade (you may choose which of these liquids to drink) over the next 2-3 hours. You should expect results within 1 to 6 hours after completing the bowel purge.  After the purge start 1/2 capful of Miralax a day.

## 2019-08-15 NOTE — Telephone Encounter (Signed)
Pt called for instructions on bowel purge.

## 2019-09-04 ENCOUNTER — Telehealth: Payer: Self-pay | Admitting: Internal Medicine

## 2019-09-04 NOTE — Telephone Encounter (Signed)
Pt notified to wait to get vaccine until she is feeling better with no symptoms.

## 2019-09-04 NOTE — Telephone Encounter (Signed)
    Patient is scheduled for covid vaccine on 3/19, she currently feels she has a sinus infection. Should she continue with getting vaccine? No fever, only sinus pressure

## 2019-09-05 ENCOUNTER — Ambulatory Visit: Payer: BC Managed Care – PPO

## 2019-09-10 ENCOUNTER — Other Ambulatory Visit: Payer: Self-pay

## 2019-09-14 MED ORDER — MUPIROCIN 2 % EX OINT: TOPICAL_OINTMENT | CUTANEOUS | 0 refills | Status: DC

## 2019-10-22 ENCOUNTER — Ambulatory Visit: Payer: BC Managed Care – PPO | Admitting: Internal Medicine

## 2019-11-26 ENCOUNTER — Telehealth: Payer: Self-pay | Admitting: Internal Medicine

## 2019-11-26 DIAGNOSIS — Z6839 Body mass index (BMI) 39.0-39.9, adult: Secondary | ICD-10-CM

## 2019-11-26 DIAGNOSIS — E559 Vitamin D deficiency, unspecified: Secondary | ICD-10-CM

## 2019-11-26 DIAGNOSIS — D509 Iron deficiency anemia, unspecified: Secondary | ICD-10-CM

## 2019-11-26 DIAGNOSIS — E669 Obesity, unspecified: Secondary | ICD-10-CM

## 2019-11-26 DIAGNOSIS — R5383 Other fatigue: Secondary | ICD-10-CM

## 2019-11-26 DIAGNOSIS — R739 Hyperglycemia, unspecified: Secondary | ICD-10-CM

## 2019-11-26 NOTE — Telephone Encounter (Signed)
New message:    Pt is calling and states she would like to have her labs rechecked to see if they have improved from last time. She states she is feeling fine but just wanted to have them rechecked. Please advise.

## 2019-11-27 ENCOUNTER — Telehealth: Payer: Self-pay | Admitting: Internal Medicine

## 2019-11-27 NOTE — Telephone Encounter (Signed)
OK CBC, Fe/TIBC, CMET, A1c, TSH, Vit D  F/u w/results thx

## 2019-11-27 NOTE — Telephone Encounter (Signed)
Pt informed , office visit scheduled for 6/17.

## 2019-11-27 NOTE — Telephone Encounter (Signed)
    Patient requesting referral to OBGYN. She does not have any names of specific provider

## 2019-11-27 NOTE — Telephone Encounter (Signed)
See 11/27/19 patient message.

## 2019-12-04 ENCOUNTER — Other Ambulatory Visit: Payer: Self-pay

## 2019-12-04 ENCOUNTER — Ambulatory Visit (INDEPENDENT_AMBULATORY_CARE_PROVIDER_SITE_OTHER): Payer: BC Managed Care – PPO | Admitting: Internal Medicine

## 2019-12-04 ENCOUNTER — Encounter: Payer: Self-pay | Admitting: Internal Medicine

## 2019-12-04 DIAGNOSIS — E669 Obesity, unspecified: Secondary | ICD-10-CM

## 2019-12-04 DIAGNOSIS — E559 Vitamin D deficiency, unspecified: Secondary | ICD-10-CM

## 2019-12-04 DIAGNOSIS — Z6839 Body mass index (BMI) 39.0-39.9, adult: Secondary | ICD-10-CM

## 2019-12-04 DIAGNOSIS — R5383 Other fatigue: Secondary | ICD-10-CM | POA: Diagnosis not present

## 2019-12-04 DIAGNOSIS — E282 Polycystic ovarian syndrome: Secondary | ICD-10-CM | POA: Diagnosis not present

## 2019-12-04 DIAGNOSIS — D509 Iron deficiency anemia, unspecified: Secondary | ICD-10-CM

## 2019-12-04 DIAGNOSIS — R739 Hyperglycemia, unspecified: Secondary | ICD-10-CM

## 2019-12-04 LAB — COMPREHENSIVE METABOLIC PANEL
ALT: 17 U/L (ref 0–35)
AST: 15 U/L (ref 0–37)
Albumin: 4.3 g/dL (ref 3.5–5.2)
Alkaline Phosphatase: 64 U/L (ref 39–117)
BUN: 9 mg/dL (ref 6–23)
CO2: 27 mEq/L (ref 19–32)
Calcium: 9.4 mg/dL (ref 8.4–10.5)
Chloride: 103 mEq/L (ref 96–112)
Creatinine, Ser: 0.61 mg/dL (ref 0.40–1.20)
GFR: 146.62 mL/min (ref >60.00)
Glucose, Bld: 86 mg/dL (ref 70–99)
Potassium: 4.1 mEq/L (ref 3.5–5.1)
Sodium: 139 mEq/L (ref 135–145)
Total Bilirubin: 0.5 mg/dL (ref 0.2–1.2)
Total Protein: 7.6 g/dL (ref 6.0–8.3)

## 2019-12-04 LAB — TSH: TSH: 2.52 u[IU]/mL (ref 0.35–4.50)

## 2019-12-04 LAB — CBC WITH DIFFERENTIAL/PLATELET
Basophils Absolute: 0 10*3/uL (ref 0.0–0.1)
Basophils Relative: 0.7 % (ref 0.0–3.0)
Eosinophils Absolute: 0.2 10*3/uL (ref 0.0–0.7)
Eosinophils Relative: 2.2 % (ref 0.0–5.0)
HCT: 36.3 % (ref 36.0–46.0)
Hemoglobin: 11.9 g/dL — ABNORMAL LOW (ref 12.0–15.0)
Lymphocytes Relative: 30.4 % (ref 12.0–46.0)
Lymphs Abs: 2.2 10*3/uL (ref 0.7–4.0)
MCHC: 32.9 g/dL (ref 30.0–36.0)
MCV: 80.5 fl (ref 78.0–100.0)
Monocytes Absolute: 0.4 10*3/uL (ref 0.1–1.0)
Monocytes Relative: 5.9 % (ref 3.0–12.0)
Neutro Abs: 4.3 10*3/uL (ref 1.4–7.7)
Neutrophils Relative %: 60.8 % (ref 43.0–77.0)
Platelets: 316 10*3/uL (ref 150.0–400.0)
RBC: 4.51 Mil/uL (ref 3.87–5.11)
RDW: 15.7 % — ABNORMAL HIGH (ref 11.5–15.5)
WBC: 7.2 10*3/uL (ref 4.0–10.5)

## 2019-12-04 LAB — VITAMIN D 25 HYDROXY (VIT D DEFICIENCY, FRACTURES): VITD: 13.17 ng/mL — ABNORMAL LOW (ref 30.00–100.00)

## 2019-12-04 LAB — IRON, TOTAL/TOTAL IRON BINDING CAP
%SAT: 9 % (calc) — ABNORMAL LOW (ref 16–45)
Iron: 33 ug/dL — ABNORMAL LOW (ref 40–190)
TIBC: 358 mcg/dL (calc) (ref 250–450)

## 2019-12-04 LAB — HEMOGLOBIN A1C: Hgb A1c MFr Bld: 5.7 % (ref 4.6–6.5)

## 2019-12-04 MED ORDER — CEPHALEXIN 500 MG PO CAPS: 500.0000 mg | ORAL_CAPSULE | Freq: Four times a day (QID) | ORAL | 3 refills | Status: DC

## 2019-12-04 NOTE — Assessment & Plan Note (Signed)
GYN ref 

## 2019-12-04 NOTE — Progress Notes (Signed)
Subjective:  Patient ID: Kendra Allen, female    DOB: [birth year]  Age: 24 y.o. MRN: [id number]  CC: No chief complaint on file.   HPI Kendra Allen presents for fatigue Programmer, systems C/o LBP - seeing a chiropractor  Outpatient Medications Prior to Visit  Medication Sig Dispense Refill  . chlorhexidine (HIBICLENS) 4 % external liquid Apply topically daily as needed. Wash 2-3 times a week 473 mL 1  . Cholecalciferol (VITAMIN D3) 50 MCG (2000 UT) capsule Take 1 capsule (2,000 Units total) by mouth daily. 100 capsule 3  . clindamycin (CLEOCIN T) 1 % external solution Apply 1 application topically as needed (hidradenitis suppurativa flare).     [name] doxycycline (VIBRAMYCIN) 50 MG capsule Take 2 capsules (100 mg total) by mouth 2 (two) times daily. 180 capsule 0  . hydrOXYzine (VISTARIL) 25 MG capsule Take 25mg s by mouth up to two times daily as needed for anxiety  1  . mupirocin ointment (BACTROBAN) 2 % On wound bid 30 g 0  . ondansetron (ZOFRAN) 4 MG tablet Take 1 tablet (4 mg total) by mouth every 8 (eight) hours as needed for nausea or vomiting. 30 tablet 0  . sertraline (ZOLOFT) 100 MG tablet Take 1 tablet (100 mg total) by mouth daily. (Patient taking differently: Take 200 mg by mouth daily. ) 90 tablet 1  . spironolactone (ALDACTONE) 50 MG tablet Take 50 mg by mouth daily.     No facility-administered medications prior to visit.    ROS: Review of Systems  Constitutional: Negative for activity change, appetite change, chills, fatigue and unexpected weight change.  HENT: Negative for congestion, mouth sores and sinus pressure.   Eyes: Negative for visual disturbance.  Respiratory: Negative for cough and chest tightness.   Gastrointestinal: Negative for abdominal pain and nausea.  Genitourinary: Negative for difficulty urinating, frequency and vaginal pain.  Musculoskeletal: Positive for back pain. Negative for gait problem.  Skin: Negative for pallor and rash.    Neurological: Negative for dizziness, tremors, weakness, numbness and headaches.  Psychiatric/Behavioral: Negative for confusion, sleep disturbance and suicidal ideas. The patient is not nervous/anxious.     Objective:  BP 130/86 (BP Location: Right Arm, Patient Position: Sitting, Cuff Size: Large)   Pulse 79   Temp 98.1 F (36.7 C) (Oral)   Ht 5\' 6"  (1.676 m)   Wt 276 lb (125.2 kg)   SpO2 97%   BMI 44.55 kg/m   BP Readings from Last 3 Encounters:  12/04/19 130/86  08/14/19 108/78  04/24/19 116/72    Wt Readings from Last 3 Encounters:  12/04/19 276 lb (125.2 kg)  08/14/19 280 lb 9.6 oz (127.3 kg)  04/24/19 275 lb (124.7 kg)    Physical Exam Constitutional:      General: She is not in acute distress.    Appearance: She is well-developed. She is obese.  HENT:     Head: Normocephalic.     Right Ear: External ear normal.     Left Ear: External ear normal.     Nose: Nose normal.  Eyes:     General:        Right eye: No discharge.        Left eye: No discharge.     Conjunctiva/sclera: Conjunctivae normal.     Pupils: Pupils are equal, round, and reactive to light.  Neck:     Thyroid: No thyromegaly.     Vascular: No JVD.     Trachea: No tracheal deviation.  Cardiovascular:     Rate and Rhythm: Normal rate and regular rhythm.     Heart sounds: Normal heart sounds.  Pulmonary:     Effort: No respiratory distress.     Breath sounds: No stridor. No wheezing.  Abdominal:     General: Bowel sounds are normal. There is no distension.     Palpations: Abdomen is soft. There is no mass.     Tenderness: There is no abdominal tenderness. There is no guarding or rebound.  Musculoskeletal:        General: No tenderness.     Cervical back: Normal range of motion and neck supple.  Lymphadenopathy:     Cervical: No cervical adenopathy.  Skin:    Findings: No erythema or rash.  Neurological:     Cranial Nerves: No cranial nerve deficit.     Motor: No abnormal muscle tone.      Coordination: Coordination normal.     Deep Tendon Reflexes: Reflexes normal.  Psychiatric:        Behavior: Behavior normal.        Thought Content: Thought content normal.        Judgment: Judgment normal.   R axilla hydradenitis   Lab Results  Component Value Date   WBC 8.0 03/31/2019   HGB 11.6 (L) 03/31/2019   HCT 36.1 03/31/2019   PLT 341.0 03/31/2019   GLUCOSE 90 03/31/2019   CHOL 155 12/11/2018   TRIG 89 12/11/2018   HDL 46 12/11/2018   LDLCALC 91 12/11/2018   ALT 13 12/11/2018   AST 12 12/11/2018   NA 138 03/31/2019   K 3.9 03/31/2019   CL 104 03/31/2019   CREATININE 0.58 03/31/2019   BUN 8 03/31/2019   CO2 26 03/31/2019   TSH 2.930 12/11/2018   HGBA1C 5.9 03/31/2019    DG Abd 2 Views  Result Date: 08/14/2019 CLINICAL DATA:  Intermittent diarrhea for 6 months EXAM: ABDOMEN - 2 VIEW COMPARISON:  None. FINDINGS: The bowel gas pattern is normal. There is no evidence of free air. Mild volume of stool projects throughout the colon. No radio-opaque calculi or other significant radiographic abnormality is seen. Unremarkable SI joints. IMPRESSION: Negative. Electronically Signed   By: Davina Poke D.O.   On: 08/14/2019 14:54    Assessment & Plan:   There are no diagnoses linked to this encounter.   No orders of the defined types were placed in this encounter.    Follow-up: No follow-ups on file.  Walker Kehr, MD

## 2019-12-04 NOTE — Patient Instructions (Signed)

## 2019-12-05 ENCOUNTER — Other Ambulatory Visit: Payer: Self-pay | Admitting: Internal Medicine

## 2019-12-05 MED ORDER — VITAMIN D3 1.25 MG (50000 UT) PO CAPS: 1.0000 | ORAL_CAPSULE | ORAL | 0 refills | Status: DC

## 2019-12-05 MED ORDER — FERROUS SULFATE 325 (65 FE) MG PO TABS
325.0000 mg | ORAL_TABLET | Freq: Every day | ORAL | 1 refills | Status: DC
Start: 2019-12-05 — End: 2020-12-30

## 2019-12-08 ENCOUNTER — Encounter: Payer: Self-pay | Admitting: Internal Medicine

## 2019-12-13 ENCOUNTER — Other Ambulatory Visit: Payer: Self-pay

## 2019-12-16 ENCOUNTER — Other Ambulatory Visit: Payer: Self-pay

## 2019-12-16 ENCOUNTER — Encounter: Payer: Self-pay | Admitting: Obstetrics and Gynecology

## 2019-12-16 ENCOUNTER — Ambulatory Visit: Payer: BC Managed Care – PPO | Admitting: Obstetrics and Gynecology

## 2019-12-16 VITALS — BP 118/62 | HR 86 | Temp 98.1°F | Ht 66.0 in | Wt 276.0 lb

## 2019-12-16 DIAGNOSIS — L680 Hirsutism: Secondary | ICD-10-CM

## 2019-12-16 DIAGNOSIS — N914 Secondary oligomenorrhea: Secondary | ICD-10-CM

## 2019-12-16 DIAGNOSIS — Z6841 Body Mass Index (BMI) 40.0 and over, adult: Secondary | ICD-10-CM

## 2019-12-16 DIAGNOSIS — E282 Polycystic ovarian syndrome: Secondary | ICD-10-CM

## 2019-12-16 MED ORDER — MEDROXYPROGESTERONE ACETATE 5 MG PO TABS: ORAL_TABLET | ORAL | 1 refills | Status: DC

## 2019-12-16 NOTE — Progress Notes (Addendum)
24 y.o. G0P0000 Single Black or African American Not Hispanic or Latino female sent for a consultation by Dr Alain Marion for PCOS. She states that she would just like to understand the diagnoses. Kendra Allen prior OB/GYN diagnosed Kendra Allen with PCOS.  Menarche at age 24, cycles were monthly up until 12/17. She bleed continuously from 12/17-5/18. She then had a hysteroscopy, D&C with removal of polyps.  She previously tried OCP's for a month and had worsening depression. Depression is currently controlled.  Kendra Allen weight affects Kendra Allen cycles.  Kendra Allen weight has increased in the last year and Kendra Allen cycles have been more irregular.  In the last year Kendra Allen cycle has been about every 1-3 months. Typically bleeds x 5-7 days.  No cycle from 4/19-10/19.  In 2020 no cycle in 1/20, 2/20, 4/20.  Then regular cycles until 2/21. No cycle in 2/21. Cycle in 1/21, 3/21 and none since. Slight spotting earlier this month. In the last year she hasn't bleed for more than 7 days in a row. She can saturate a super tampon in 4 hours. Some premenstrual cramps, tolerable.  She has some hair growth above Kendra Allen lip, on Kendra Allen chin and on Kendra Allen abdomen. She has hidradenitis suppurativa, has it in the genital region and axilla. She is seeking a Sales promotion account executive at [employer]. She hasn't tolerated doxycycline, on spironolactone, plans laser hair removal.   She has been sexually active in the past, not since 8/20. Thinks she has had STD testing since then. Uses condoms when sexually active.  In 1/20 she had unprotected intercourse and had trich, treated, retested and negative.   She was seen at the weight loss clinic over a year ago. She is trying to work on healthy eating. She has lost 5 lbs in the last few months. Hasn't been exercising but knows she should.   Labs from 03/31/19 with normal TSH, Prediabetes with a HgbA1C of 5.9% 12/04/19: TSH 2.53, HgbA1C 5.7%, Hgb 11.9 gm/dl, iron 33 (low), vit d 13.17  She had a normal lipid panel in 6/20    Patient's last  menstrual period was 09/11/2019.          Sexually active: No.  The current method of family planning is abstinence.    Exercising: No.  The patient does not participate in regular exercise at present. Smoker:  no  Health Maintenance: Pap:  Summer of 2020 normal per patient  History of abnormal Pap:  no MMG:  NA BMD:   NA Colonoscopy: NA TDaP:  12/04/11 Gardasil:  No    reports that she has never smoked. She has never used smokeless tobacco. She reports that she does not drink alcohol and does not use drugs. She works as a Publishing rights manager. Ultimately would like to be the Principle of a charter school.   Past Medical History:  Diagnosis Date  . Anemia   . Anxiety   . Chest pain   . Constipation   . Depression   . Hidradenitis suppurativa   . PCOS (polycystic ovarian syndrome)   . Vitamin D deficiency     Past Surgical History:  Procedure Laterality Date  . CERVICAL POLYPECTOMY    . DILATATION & CURETTAGE/HYSTEROSCOPY WITH MYOSURE N/A 11/21/2016   Procedure: DILATATION & CURETTAGE/HYSTEROSCOPY WITH MYOSURE;  [name], MD;  Location: [facility];  Service: Gynecology;  [name];    Current Outpatient Medications  Medication Sig Dispense Refill  . cephALEXin (KEFLEX) 500 MG capsule Take 1 capsule (500 mg total) by mouth  4 (four) times daily. For hydradenitis flare up 28 capsule 3  . chlorhexidine (HIBICLENS) 4 % external liquid Apply topically daily as needed. Wash 2-3 times a week 473 mL 1  . Cholecalciferol (VITAMIN D3) 1.25 MG (50000 UT) CAPS Take 1 capsule by mouth once a week. 12 capsule 0  . clindamycin (CLEOCIN T) 1 % external solution Apply 1 application topically as needed (hidradenitis suppurativa flare).     . ferrous sulfate 325 (65 FE) MG tablet Take 1 tablet (325 mg total) by mouth daily. 90 tablet 1  . hydrOXYzine (VISTARIL) 25 MG capsule Take 25mg s by mouth up to two times daily as needed for anxiety  1  . mupirocin ointment  (BACTROBAN) 2 % On wound bid 30 g 0  . sertraline (ZOLOFT) 100 MG tablet Take 1 tablet (100 mg total) by mouth daily. (Patient taking differently: Take 200 mg by mouth daily. ) 90 tablet 1  . spironolactone (ALDACTONE) 50 MG tablet Take 50 mg by mouth daily.     No current facility-administered medications for this visit.    Family History  Problem Relation Age of Onset  . Hypertension Mother   . Depression Mother   . Obesity Mother   . Sleep apnea Father   . Obesity Father   . Breast cancer Sister   . Colon cancer Maternal Uncle   . Diabetes Paternal Aunt   . Prostate cancer Maternal Grandfather     Review of Systems  Genitourinary: Positive for menstrual problem.  All other systems reviewed and are negative.   Exam:   BP 118/62   Pulse 86   Temp 98.1 F (36.7 C)   Ht 5\' 6"  (1.676 m)   Wt 276 lb (125.2 kg)   LMP 09/11/2019   SpO2 98%   BMI 44.55 kg/m   Weight change: @WEIGHTCHANGE @ Height:   Height: 5\' 6"  (167.6 cm)  Ht Readings from Last 3 Encounters:  12/16/19 5\' 6"  (1.676 m)  12/04/19 5\' 6"  (1.676 m)  08/14/19 5\' 6"  (1.676 m)    General appearance: alert, cooperative and appears stated age Head: Normocephalic, without obvious abnormality, atraumatic Neck: no adenopathy, supple, symmetrical, trachea midline and thyroid normal to inspection and palpation Skin: Skin color, texture, turgor normal. No rashes or lesions. Mild hair growth on Kendra Allen stomach, no significant hair on Kendra Allen face, but she recently had it removed.  A:  PCOS  Oligomenorrhea  BMI 44  Mild hirsutism, on spironolactone.   P:   Discussed the hormonal imbalance with PCOS, the side effects, and discussed risk of metabolic syndrome  Discussed weight loss can help with resolution of normal cycles  Discussed the importance of endometrial protection  Discussed option of OCP's (no contraindications), mirena IUD and cyclic provera  She is not currently sexually active and would like cyclic provera (sent  in)  Will get a copy of Kendra Allen prior GYN records  Due for an annual sometime this summer  Use condoms if sexually active.   Information on PCOS given to the patient through Epic and up to date.    CC: Dr Alain Marion  Note sent  ~60 minutes in total patient care  12/28/19 Addendum Records reviewed: Pap 11/13/18 Negative and satisfactory Labs 03/14/18 FSH 7.8, LH 15.2, estradiol 59.5, HgbA1C 5.5%, prolactin 24.8 (normal), TSH 2.16

## 2019-12-16 NOTE — Patient Instructions (Signed)
Polycystic Ovarian Syndrome  Polycystic ovarian syndrome (PCOS) is a common hormonal disorder among women of reproductive age. In most women with PCOS, many small fluid-filled sacs (cysts) grow on the ovaries, and the cysts are not part of a normal menstrual cycle. PCOS can cause problems with your menstrual periods and make it difficult to get pregnant. It can also cause an increased risk of miscarriage with pregnancy. If it is not treated, PCOS can lead to serious health problems, such as diabetes and heart disease. What are the causes? The cause of PCOS is not known, but it may be the result of a combination of certain factors, such as:  Irregular menstrual cycle.  High levels of certain hormones (androgens).  Problems with the hormone that helps to control blood sugar (insulin resistance).  Certain genes. What increases the risk? This condition is more likely to develop in women who have a family history of PCOS. What are the signs or symptoms? Symptoms of PCOS may include:  Multiple ovarian cysts.  Infrequent periods or no periods.  Periods that are too frequent or too heavy.  Unpredictable periods.  Inability to get pregnant (infertility) because of not ovulating.  Increased growth of hair on the face, chest, stomach, back, thumbs, thighs, or toes.  Acne or oily skin. Acne may develop during adulthood, and it may not respond to treatment.  Pelvic pain.  Weight gain or obesity.  Patches of thickened and dark brown or black skin on the neck, arms, breasts, or thighs (acanthosis nigricans).  Excess hair growth on the face, chest, abdomen, or upper thighs (hirsutism). How is this diagnosed? This condition is diagnosed based on:  Your medical history.  A physical exam, including a pelvic exam. Your health care provider may look for areas of increased hair growth on your skin.  Tests, such as: ? Ultrasound. This may be used to examine the ovaries and the lining of the  uterus (endometrium) for cysts. ? Blood tests. These may be used to check levels of sugar (glucose), female hormone (testosterone), and female hormones (estrogen and progesterone) in your blood. How is this treated? There is no cure for PCOS, but treatment can help to manage symptoms and prevent more health problems from developing. Treatment varies depending on:  Your symptoms.  Whether you want to have a baby or whether you need birth control (contraception). Treatment may include nutrition and lifestyle changes along with:  Progesterone hormone to start a menstrual period.  Birth control pills to help you have regular menstrual periods.  Medicines to make you ovulate, if you want to get pregnant.  Medicine to reduce excessive hair growth.  Surgery, in severe cases. This may involve making small holes in one or both of your ovaries. This decreases the amount of testosterone that your body produces. Follow these instructions at home:  Take over-the-counter and prescription medicines only as told by your health care provider.  Follow a healthy meal plan. This can help you reduce the effects of PCOS. ? Eat a healthy diet that includes lean proteins, complex carbohydrates, fresh fruits and vegetables, low-fat dairy products, and healthy fats. Make sure to eat enough fiber.  If you are overweight, lose weight as told by your health care provider. ? Losing 10% of your body weight may improve symptoms. ? Your health care provider can determine how much weight loss is best for you and can help you lose weight safely.  Keep all follow-up visits as told by your health care provider.  This is important. Contact a health care provider if:  Your symptoms do not get better with medicine.  You develop new symptoms. This information is not intended to replace advice given to you by your health care provider. Make sure you discuss any questions you have with your health care provider. Document  Revised: 05/18/2017 Document Reviewed: 11/21/2015 Elsevier Patient Education  2020 Elsevier Inc.  

## 2019-12-26 ENCOUNTER — Other Ambulatory Visit: Payer: Self-pay | Admitting: Internal Medicine

## 2019-12-26 NOTE — Telephone Encounter (Signed)
Pt last Rx for hydroxyzine was 01/13/2015 please advise

## 2019-12-26 NOTE — Telephone Encounter (Signed)
    1.Medication Requested:hydrOXYzine (VISTARIL) 25 MG capsule  2. Pharmacy (Name, Street, City):[id number] - [place], [place] - [address]  3. On Med List: YES  4. Last Visit with PCP: 12/04/19  5. Next visit date with PCP: N/A   Agent: Please be advised that RX refills may take up to 3 business days. We ask that you follow-up with your pharmacy.

## 2019-12-29 MED ORDER — HYDROXYZINE PAMOATE 25 MG PO CAPS: ORAL_CAPSULE | ORAL | 1 refills | Status: DC

## 2019-12-29 NOTE — Telephone Encounter (Signed)
See 12/29/2019 med refill 

## 2020-01-01 ENCOUNTER — Telehealth: Payer: Self-pay

## 2020-01-01 NOTE — Telephone Encounter (Signed)
Spoke with patient. Patient started cyclic Provera as discussed on 12/16/19. Took Provera 5 mg on Mon, Tues and wed of this week. Reports she started cramping today, 8/10; improved with OTC ibuprofen, now 2/10. Asking when her menses should start? And ok to continue Provera 5mg  PO x 5 days? Reports nausea with pain, this has resolved as pain improved. Denies any other symptoms.   Advised ok to continue Provera to complete 5 days. Advised can take OTC ibuprofen PRN for cramps. Menses should start with 2 wks of completing medication. Advised to call with update if she has a menses or not, if pain not relieved with ibuprofen or if any new symptoms develop. Advised I will update Dr. Talbert Nan and f/u with any additional recommendations. Patient agreeable.   Routing to Dr. Talbert Nan for review.

## 2020-01-01 NOTE — Telephone Encounter (Signed)
Patient is calling in regards to having cramps after taking medication "progesterone". Patient also has questions regarding the medication.

## 2020-01-12 ENCOUNTER — Other Ambulatory Visit: Payer: Self-pay | Admitting: Internal Medicine

## 2020-01-18 ENCOUNTER — Telehealth: Payer: Self-pay | Admitting: Obstetrics and Gynecology

## 2020-01-18 NOTE — Telephone Encounter (Signed)
Please let the patient know that I got a copy of her mammogram. [facility] estimates her Kendra Allen BRCA risk at 23.9%. Her maternal half sister had breast cancer at 51. I would recommend that she have a consultation with Irving Copas, the genetic counselor to further ascertain her risk and give recommendations.  Ask if she knows if her sister has had genetic testing? Thanks

## 2020-01-19 NOTE — Telephone Encounter (Addendum)
Spoke with patient, advise as seen below per Dr. Talbert Nan. Patient is unsure if her sister had genetic testing, will further discuss with her mother. Patient declines referral at this time, will further discuss with her mother and return call if she desires referral. Questions answered.   Patient also provided update on Provera. Reports menses after provera, LMP 01/14/20. Patient is aware she will take one tabe a dat for 5 days q other month if no spontaneous menses.   Routing to Dr. Rosann Auerbach.  Encounter closed.

## 2020-01-20 ENCOUNTER — Encounter: Payer: Self-pay | Admitting: Obstetrics and Gynecology

## 2020-02-03 ENCOUNTER — Telehealth: Payer: Self-pay | Admitting: Internal Medicine

## 2020-02-03 NOTE — Telephone Encounter (Signed)
New message:   1.Medication Requested: sertraline (ZOLOFT) 100 MG tablet 2. Pharmacy (Name, Street, Ai): [id number] [place], [place] 3. On Med List: Yes  4. Last Visit with PCP: 12/04/19  5. Next visit date with PCP: None   Agent: Please be advised that RX refills may take up to 3 business days. We ask that you follow-up with your pharmacy.

## 2020-02-04 MED ORDER — SERTRALINE HCL 100 MG PO TABS: 100.0000 mg | ORAL_TABLET | Freq: Every day | ORAL | 1 refills | Status: DC

## 2020-02-04 NOTE — Telephone Encounter (Signed)
See 02/04/20 med refill

## 2020-02-05 ENCOUNTER — Other Ambulatory Visit: Payer: Self-pay | Admitting: Internal Medicine

## 2020-03-01 ENCOUNTER — Other Ambulatory Visit: Payer: Self-pay | Admitting: Internal Medicine

## 2020-03-01 ENCOUNTER — Other Ambulatory Visit: Payer: Self-pay

## 2020-03-01 ENCOUNTER — Other Ambulatory Visit: Payer: BC Managed Care – PPO

## 2020-03-01 DIAGNOSIS — Z20822 Contact with and (suspected) exposure to covid-19: Secondary | ICD-10-CM

## 2020-03-03 LAB — NOVEL CORONAVIRUS, NAA: SARS-CoV-2, NAA: NOT DETECTED

## 2020-03-03 LAB — SARS-COV-2, NAA 2 DAY TAT

## 2020-03-03 LAB — SPECIMEN STATUS REPORT

## 2020-03-30 ENCOUNTER — Other Ambulatory Visit: Payer: Self-pay | Admitting: Internal Medicine

## 2020-03-30 MED ORDER — SERTRALINE HCL 100 MG PO TABS: 100.0000 mg | ORAL_TABLET | Freq: Every day | ORAL | 0 refills | Status: DC

## 2020-03-30 NOTE — Telephone Encounter (Signed)
Notified pt mom can send in #c 48 only until appt 04/09/20. Pt is not to take 2 Sertraline a day only 1.../lmb

## 2020-03-30 NOTE — Telephone Encounter (Signed)
  Patient's mother requesting new order be written for sertraline (ZOLOFT) 100 MG tablet She states patient has been taking 2 tablets, she has no medication remaining [facility]acy:[id number] [place], [place] - [address]

## 2020-04-09 ENCOUNTER — Other Ambulatory Visit: Payer: Self-pay

## 2020-04-09 ENCOUNTER — Encounter: Payer: Self-pay | Admitting: Internal Medicine

## 2020-04-09 ENCOUNTER — Ambulatory Visit: Payer: BC Managed Care – PPO | Admitting: Internal Medicine

## 2020-04-09 ENCOUNTER — Other Ambulatory Visit (INDEPENDENT_AMBULATORY_CARE_PROVIDER_SITE_OTHER): Payer: BC Managed Care – PPO

## 2020-04-09 VITALS — BP 120/68 | HR 86 | Temp 98.0°F | Ht 66.0 in | Wt 275.0 lb

## 2020-04-09 DIAGNOSIS — E559 Vitamin D deficiency, unspecified: Secondary | ICD-10-CM

## 2020-04-09 DIAGNOSIS — F32A Depression, unspecified: Secondary | ICD-10-CM | POA: Diagnosis not present

## 2020-04-09 DIAGNOSIS — D509 Iron deficiency anemia, unspecified: Secondary | ICD-10-CM

## 2020-04-09 DIAGNOSIS — R0683 Snoring: Secondary | ICD-10-CM | POA: Diagnosis not present

## 2020-04-09 DIAGNOSIS — R739 Hyperglycemia, unspecified: Secondary | ICD-10-CM

## 2020-04-09 DIAGNOSIS — Z0000 Encounter for general adult medical examination without abnormal findings: Secondary | ICD-10-CM

## 2020-04-09 DIAGNOSIS — F419 Anxiety disorder, unspecified: Secondary | ICD-10-CM

## 2020-04-09 LAB — HEMOGLOBIN A1C: Hgb A1c MFr Bld: 5.9 % (ref 4.6–6.5)

## 2020-04-09 LAB — CBC WITH DIFFERENTIAL/PLATELET
Basophils Absolute: 0.1 10*3/uL (ref 0.0–0.1)
Basophils Relative: 0.7 % (ref 0.0–3.0)
Eosinophils Absolute: 0.2 10*3/uL (ref 0.0–0.7)
Eosinophils Relative: 2.2 % (ref 0.0–5.0)
HCT: 35.1 % — ABNORMAL LOW (ref 36.0–46.0)
Hemoglobin: 11.4 g/dL — ABNORMAL LOW (ref 12.0–15.0)
Lymphocytes Relative: 33.3 % (ref 12.0–46.0)
Lymphs Abs: 2.8 10*3/uL (ref 0.7–4.0)
MCHC: 32.4 g/dL (ref 30.0–36.0)
MCV: 78.2 fl (ref 78.0–100.0)
Monocytes Absolute: 0.7 10*3/uL (ref 0.1–1.0)
Monocytes Relative: 8.1 % (ref 3.0–12.0)
Neutro Abs: 4.7 10*3/uL (ref 1.4–7.7)
Neutrophils Relative %: 55.7 % (ref 43.0–77.0)
Platelets: 304 10*3/uL (ref 150.0–400.0)
RBC: 4.49 Mil/uL (ref 3.87–5.11)
RDW: 15.8 % — ABNORMAL HIGH (ref 11.5–15.5)
WBC: 8.5 10*3/uL (ref 4.0–10.5)

## 2020-04-09 LAB — COMPREHENSIVE METABOLIC PANEL
ALT: 14 U/L (ref 0–35)
AST: 13 U/L (ref 0–37)
Albumin: 4.3 g/dL (ref 3.5–5.2)
Alkaline Phosphatase: 65 U/L (ref 39–117)
BUN: 11 mg/dL (ref 6–23)
CO2: 27 mEq/L (ref 19–32)
Calcium: 9.5 mg/dL (ref 8.4–10.5)
Chloride: 102 mEq/L (ref 96–112)
Creatinine, Ser: 0.54 mg/dL (ref 0.40–1.20)
GFR: 129.52 mL/min (ref >60.00)
Glucose, Bld: 80 mg/dL (ref 70–99)
Potassium: 3.7 mEq/L (ref 3.5–5.1)
Sodium: 137 mEq/L (ref 135–145)
Total Bilirubin: 0.5 mg/dL (ref 0.2–1.2)
Total Protein: 7.5 g/dL (ref 6.0–8.3)

## 2020-04-09 LAB — LIPID PANEL
Cholesterol: 140 mg/dL (ref 0–200)
HDL: 39.3 mg/dL (ref >39.00)
LDL Cholesterol: 81 mg/dL (ref 0–99)
NonHDL: 100.39
Total CHOL/HDL Ratio: 4
Triglycerides: 97 mg/dL (ref 0.0–149.0)
VLDL: 19.4 mg/dL (ref 0.0–40.0)

## 2020-04-09 MED ORDER — SERTRALINE HCL 100 MG PO TABS: 200.0000 mg | ORAL_TABLET | Freq: Every day | ORAL | 3 refills | Status: DC

## 2020-04-09 MED ORDER — VITAMIN D3 50 MCG (2000 UT) PO CAPS
2000.0000 [IU] | ORAL_CAPSULE | Freq: Every day | ORAL | 3 refills | Status: DC
Start: 2020-04-09 — End: 2020-07-21

## 2020-04-09 NOTE — Patient Instructions (Signed)
You can try Lion's Mane Mushroom extract or capsules for memory and focus

## 2020-04-09 NOTE — Progress Notes (Signed)
Subjective:  Patient ID: Kendra Allen, female    DOB: Jul 09, 1995  Age: 24 y.o. MRN: [id number]  CC: No chief complaint on file.   HPI Kendra Allen presents for a well exam C/o snoring C/o depression  Outpatient Medications Prior to Visit  Medication Sig Dispense Refill  . cephALEXin (KEFLEX) 500 MG capsule Take 1 capsule (500 mg total) by mouth 4 (four) times daily. For hydradenitis flare up 28 capsule 3  . chlorhexidine (HIBICLENS) 4 % external liquid Apply topically daily as needed. Wash 2-3 times a week 473 mL 1  . Cholecalciferol (VITAMIN D3) 1.25 MG (50000 UT) CAPS Take 1 capsule by mouth once a week. 12 capsule 0  . clindamycin (CLEOCIN T) 1 % external solution Apply 1 application topically as needed (hidradenitis suppurativa flare).     . ferrous sulfate 325 (65 FE) MG tablet Take 1 tablet (325 mg total) by mouth daily. 90 tablet 1  . hydrOXYzine (VISTARIL) 25 MG capsule TAKE 25MG S BY MOUTH UP TO TWO TIMES DAILY AS NEEDED FOR ANXIETY 180 capsule 1  . medroxyPROGESTERone (PROVERA) 5 MG tablet Take one tablet a day for 5 days every other month if no spontaneous cycles. 15 tablet 1  . mupirocin ointment (BACTROBAN) 2 % On wound bid 30 g 0  . sertraline (ZOLOFT) 100 MG tablet Take 1 tablet (100 mg total) by mouth daily. 30 tablet 0  . spironolactone (ALDACTONE) 50 MG tablet Take 50 mg by mouth daily.     No facility-administered medications prior to visit.    ROS: Review of Systems  Constitutional: Positive for fatigue. Negative for activity change, appetite change, chills and unexpected weight change.  HENT: Negative for congestion, mouth sores and sinus pressure.   Eyes: Negative for visual disturbance.  Respiratory: Negative for cough and chest tightness.   Gastrointestinal: Negative for abdominal pain and nausea.  Genitourinary: Negative for difficulty urinating, frequency and vaginal pain.  Musculoskeletal: Negative for back pain and gait problem.  Skin: Negative  for pallor and rash.  Neurological: Negative for dizziness, tremors, weakness, numbness and headaches.  Psychiatric/Behavioral: Positive for decreased concentration. Negative for confusion, dysphoric mood, self-injury, sleep disturbance and suicidal ideas. The patient is nervous/anxious.     Objective:  BP 120/68 (BP Location: Left Arm, Patient Position: Sitting, Cuff Size: Large)   Pulse 86   Temp 98 F (36.7 C) (Oral)   Ht 5\' 6"  (1.676 m)   Wt 275 lb (124.7 kg)   SpO2 99%   BMI 44.39 kg/m   BP Readings from Last 3 Encounters:  04/09/20 120/68  12/16/19 118/62  12/04/19 130/86    Wt Readings from Last 3 Encounters:  04/09/20 275 lb (124.7 kg)  12/16/19 276 lb (125.2 kg)  12/04/19 276 lb (125.2 kg)    Physical Exam Constitutional:      General: She is not in acute distress.    Appearance: She is well-developed. She is obese.  HENT:     Head: Normocephalic.     Right Ear: External ear normal.     Left Ear: External ear normal.     Nose: Nose normal.  Eyes:     General:        Right eye: No discharge.        Left eye: No discharge.     Conjunctiva/sclera: Conjunctivae normal.     Pupils: Pupils are equal, round, and reactive to light.  Neck:     Thyroid: No thyromegaly.  Vascular: No JVD.     Trachea: No tracheal deviation.  Cardiovascular:     Rate and Rhythm: Normal rate and regular rhythm.     Heart sounds: Normal heart sounds.  Pulmonary:     Effort: No respiratory distress.     Breath sounds: No stridor. No wheezing.  Abdominal:     General: Bowel sounds are normal. There is no distension.     Palpations: Abdomen is soft. There is no mass.     Tenderness: There is no abdominal tenderness. There is no guarding or rebound.  Musculoskeletal:        General: No tenderness.     Cervical back: Normal range of motion and neck supple.  Lymphadenopathy:     Cervical: No cervical adenopathy.  Skin:    Findings: No erythema or rash.  Neurological:      Cranial Nerves: No cranial nerve deficit.     Motor: No abnormal muscle tone.     Coordination: Coordination normal.     Deep Tendon Reflexes: Reflexes normal.  Psychiatric:        Behavior: Behavior normal.        Thought Content: Thought content normal.        Judgment: Judgment normal.     Lab Results  Component Value Date   WBC 7.2 12/04/2019   HGB 11.9 (L) 12/04/2019   HCT 36.3 12/04/2019   PLT 316.0 12/04/2019   GLUCOSE 86 12/04/2019   CHOL 155 12/11/2018   TRIG 89 12/11/2018   HDL 46 12/11/2018   LDLCALC 91 12/11/2018   ALT 17 12/04/2019   AST 15 12/04/2019   NA 139 12/04/2019   K 4.1 12/04/2019   CL 103 12/04/2019   CREATININE 0.61 12/04/2019   BUN 9 12/04/2019   CO2 27 12/04/2019   TSH 2.52 12/04/2019   HGBA1C 5.7 12/04/2019    DG Abd 2 Views  Result Date: 08/14/2019 CLINICAL DATA:  Intermittent diarrhea for 6 months EXAM: ABDOMEN - 2 VIEW COMPARISON:  None. FINDINGS: The bowel gas pattern is normal. There is no evidence of free air. Mild volume of stool projects throughout the colon. No radio-opaque calculi or other significant radiographic abnormality is seen. Unremarkable SI joints. IMPRESSION: Negative. Electronically Signed   By: Davina Poke D.O.   On: 08/14/2019 14:54    Assessment & Plan:   There are no diagnoses linked to this encounter.   No orders of the defined types were placed in this encounter.    Follow-up: No follow-ups on file.  Walker Kehr, MD

## 2020-04-10 LAB — IRON,TIBC AND FERRITIN PANEL
%SAT: 9 % (calc) — ABNORMAL LOW (ref 16–45)
Ferritin: 41 ng/mL (ref 16–154)
Iron: 37 ug/dL — ABNORMAL LOW (ref 40–190)
TIBC: 393 mcg/dL (calc) (ref 250–450)

## 2020-04-12 DIAGNOSIS — Z0000 Encounter for general adult medical examination without abnormal findings: Secondary | ICD-10-CM | POA: Insufficient documentation

## 2020-04-12 DIAGNOSIS — R0683 Snoring: Secondary | ICD-10-CM | POA: Insufficient documentation

## 2020-04-12 LAB — VITAMIN D 25 HYDROXY (VIT D DEFICIENCY, FRACTURES): VITD: 13.87 ng/mL — ABNORMAL LOW (ref 30.00–100.00)

## 2020-04-12 LAB — VITAMIN B12: Vitamin B-12: 405 pg/mL (ref 211–911)

## 2020-04-12 LAB — TSH: TSH: 2.69 u[IU]/mL (ref 0.35–4.50)

## 2020-04-12 NOTE — Assessment & Plan Note (Signed)
Changing may need to continue iron

## 2020-04-12 NOTE — Assessment & Plan Note (Signed)

## 2020-04-12 NOTE — Assessment & Plan Note (Signed)
Rule out sleep apnea.  Pulmonary referral

## 2020-04-12 NOTE — Assessment & Plan Note (Signed)
- 

## 2020-04-16 ENCOUNTER — Other Ambulatory Visit: Payer: Self-pay | Admitting: Internal Medicine

## 2020-04-16 MED ORDER — VITAMIN D3 1.25 MG (50000 UT) PO CAPS: 1.0000 | ORAL_CAPSULE | ORAL | 0 refills | Status: DC

## 2020-05-10 ENCOUNTER — Other Ambulatory Visit: Payer: Self-pay

## 2020-05-10 ENCOUNTER — Ambulatory Visit: Payer: BC Managed Care – PPO | Admitting: Obstetrics and Gynecology

## 2020-05-10 ENCOUNTER — Encounter: Payer: Self-pay | Admitting: Obstetrics and Gynecology

## 2020-05-10 VITALS — BP 110/64 | HR 83 | Ht 67.0 in | Wt 278.5 lb

## 2020-05-10 DIAGNOSIS — N914 Secondary oligomenorrhea: Secondary | ICD-10-CM | POA: Diagnosis not present

## 2020-05-10 DIAGNOSIS — E282 Polycystic ovarian syndrome: Secondary | ICD-10-CM

## 2020-05-10 DIAGNOSIS — Z803 Family history of malignant neoplasm of breast: Secondary | ICD-10-CM

## 2020-05-10 DIAGNOSIS — R7303 Prediabetes: Secondary | ICD-10-CM

## 2020-05-10 DIAGNOSIS — Z01419 Encounter for gynecological examination (general) (routine) without abnormal findings: Secondary | ICD-10-CM | POA: Diagnosis not present

## 2020-05-10 DIAGNOSIS — Z6841 Body Mass Index (BMI) 40.0 and over, adult: Secondary | ICD-10-CM

## 2020-05-10 DIAGNOSIS — Z862 Personal history of diseases of the blood and blood-forming organs and certain disorders involving the immune mechanism: Secondary | ICD-10-CM

## 2020-05-10 MED ORDER — MEDROXYPROGESTERONE ACETATE 5 MG PO TABS: ORAL_TABLET | ORAL | 3 refills | Status: DC

## 2020-05-10 NOTE — Progress Notes (Signed)
24 y.o. Kendra Allen or African American Not Hispanic or Latino female here for annual exam.  Patient states that she never picked up the refill of the provera. Her cycle has gotten lighter and lighter since June when she took the first dose.   She was seen in 6/21 for PCOS with oligomenorrhea and hirsutism. She hasn't tolerated OCP's in the past. She wasn't sexually active at that time and was started on cyclic provera.  She took the provera in June, not since then. Since then she has been bleeding monthly, but this last cycle was just spotting.   Not sexually active in over a year. Has had std testing since then  Period Cycle (Days): 28 Period Duration (Days): 6 Period Pattern: Regular Menstrual Flow: Light Menstrual Control: Panty liner Menstrual Control Change Freq (Hours): 8 Dysmenorrhea Symptoms: Cramping  Previously she was bleeding heavier, she could saturate an overnight pad in 2-3 hours.   CBC from last month with mild anemia, Hgb of 11.4 gm/dl, low iron. She has prediabetes, HgbA1C of 5.9%  Patient's last menstrual period was 04/25/2020.          Sexually active: No.  The current method of family planning is abstinence.    Exercising: No.  The patient does not participate in regular exercise at present. Smoker:  no  Health Maintenance: Pap:  11/13/18 negative  History of abnormal Pap:  no MMG:  01/20/20 normal, f/u in 3 years recommend, TC risk calculated at 23.9% BMD:   NA Colonoscopy: NA TDaP:  12/04/11 Gardasil: none   reports that she has never smoked. She has never used smokeless tobacco. She reports that she does not drink alcohol and does not use drugs. She works as a second Land and a Surveyor, minerals. Ultimately would like to be the Principle of a charter school.   Past Medical History:  Diagnosis Date  . Anemia   . Anxiety   . Chest pain   . Constipation   . Depression   . Hidradenitis suppurativa   . PCOS (polycystic ovarian syndrome)   .  Prediabetes   . Vitamin D deficiency     Past Surgical History:  Procedure Laterality Date  . CERVICAL POLYPECTOMY    . DILATATION & CURETTAGE/HYSTEROSCOPY WITH MYOSURE N/A 11/21/2016   Procedure: DILATATION & CURETTAGE/HYSTEROSCOPY WITH MYOSURE;  [name], MD;  Location: [facility];  Service: Gynecology;  [name];    Current Outpatient Medications  Medication Sig Dispense Refill  . cephALEXin (KEFLEX) 500 MG capsule Take 1 capsule (500 mg total) by mouth 4 (four) times daily. For hydradenitis flare up 28 capsule 3  . chlorhexidine (HIBICLENS) 4 % external liquid Apply topically daily as needed. Wash 2-3 times a week 473 mL 1  . Cholecalciferol (VITAMIN D3) 1.25 MG (50000 UT) CAPS Take 1 capsule by mouth once a week. 8 capsule 0  . Cholecalciferol (VITAMIN D3) 50 MCG (2000 UT) capsule Take 1 capsule (2,000 Units total) by mouth daily. 100 capsule 3  . clindamycin (CLEOCIN T) 1 % external solution Apply 1 application topically as needed (hidradenitis suppurativa flare).     . ferrous sulfate 325 (65 FE) MG tablet Take 1 tablet (325 mg total) by mouth daily. 90 tablet 1  . hydrOXYzine (VISTARIL) 25 MG capsule TAKE 25MG S BY MOUTH UP TO TWO TIMES DAILY AS NEEDED FOR ANXIETY 180 capsule 1  . sertraline (ZOLOFT) 100 MG tablet Take 2 tablets (200 mg total) by mouth daily. 180 tablet 3  .  spironolactone (ALDACTONE) 50 MG tablet Take 50 mg by mouth daily.     No current facility-administered medications for this visit.    Family History  Problem Relation Age of Onset  . Hypertension Mother   . Depression Mother   . Obesity Mother   . Sleep apnea Father   . Obesity Father   . Breast cancer Sister   . Colon cancer Maternal Uncle   . Diabetes Paternal Aunt   . Prostate cancer Maternal Grandfather     Review of Systems  All other systems reviewed and are negative.   Exam:   BP 110/64   Pulse 83   Ht 5\' 7"  (1.702 m)   Wt 278 lb 8 oz (126.3 kg)   LMP 04/25/2020    SpO2 98%   BMI 43.62 kg/m   Weight change: @WEIGHTCHANGE @ Height:   Height: 5\' 7"  (170.2 cm)  Ht Readings from Last 3 Encounters:  05/10/20 5\' 7"  (1.702 m)  04/09/20 5\' 6"  (1.676 m)  12/16/19 5\' 6"  (1.676 m)    General appearance: alert, cooperative and appears stated age Head: Normocephalic, without obvious abnormality, atraumatic Neck: no adenopathy, supple, symmetrical, trachea midline and thyroid normal to inspection and palpation Lungs: clear to auscultation bilaterally Cardiovascular: regular rate and rhythm Breasts: normal appearance, no masses or tenderness Abdomen: soft, non-tender; non distended,  no masses,  no organomegaly Extremities: extremities normal, atraumatic, no cyanosis or edema Skin: Skin color, texture, turgor normal. No rashes or lesions Lymph nodes: Cervical, supraclavicular, and axillary nodes normal. No abnormal inguinal nodes palpated Neurologic: Grossly normal   Pelvic: External genitalia:  no lesions              Urethra:  normal appearing urethra with no masses, tenderness or lesions              Bartholins and Skenes: normal                 Vagina: normal appearing vagina with normal color and discharge, no lesions              Cervix: no lesions               Bimanual Exam:  Uterus:  normal size, contour, position, consistency, mobility, non-tender and anteverted              Adnexa: no mass, fullness, tenderness               Rectovaginal: Confirms               Anus:  normal sphincter tone, no lesions  Terence Lux chaperoned for the exam.  A:  Well Woman with normal exam  Maternal half sister with breast cancer at 57  H/O oligomenorrhea  Not currently sexually active.   PCOS  Hidradenitis, on spironolactone. Aware she should not get pregnant on the spironolactone.  BMI 43, she has been to weight loss clinic and has done weight watchers. Has the tools, stress eats.    P:   No pap this year  Mammogram was normal in 8/21  Will refer  to genetics for evaluation and recommendations  Discussed breast self exam  Discussed calcium and vit D intake  Cyclic provera, recommend she take it monthly to try and prevent heavy bleeding.

## 2020-05-10 NOTE — Patient Instructions (Signed)
You can start taking the provera on day 20 of your cycle to make sure your lining is emptying out every month and prevent heavy bleeding. Only take one tablet a day for 5 days.   EXERCISE   We recommended that you start or continue a regular exercise program for good health. Physical activity is anything that gets your body moving, some is better than none. The CDC recommends 150 minutes per week of Moderate-Intensity Aerobic Activity and 2 or more days of Muscle Strengthening Activity.  Benefits of exercise are limitless: helps weight loss/weight maintenance, improves mood and energy, helps with depression and anxiety, improves sleep, tones and strengthens muscles, improves balance, improves bone density, protects from chronic conditions such as heart disease, high blood pressure and diabetes and so much more. To learn more visit: WhyNotPoker.uy  DIET: Good nutrition starts with a healthy diet of fruits, vegetables, whole grains, and lean protein sources. Drink plenty of water for hydration. Minimize empty calories, sodium, sweets. For more information about dietary recommendations visit: GeekRegister.com.ee and http://schaefer-mitchell.com/  ALCOHOL:  Women should limit their alcohol intake to no more than 7 drinks/beers/glasses of wine (combined, not each!) per week. Moderation of alcohol intake to this level decreases your risk of breast cancer and liver damage.  If you are concerned that you may have a problem, or your friends have told you they are concerned about your drinking, there are many resources to help. A well-known program that is free, effective, and available to all people all over the nation is Alcoholics Anonymous.  Check out this site to learn more: BlockTaxes.se   CALCIUM AND VITAMIN D:  Adequate intake of calcium and Vitamin D are recommended for bone health.  The recommendations for exact  amounts of these supplements seem to change often, but generally speaking 1000-1500 mg of calcium (between diet and supplement) and 800 units of Vitamin D per day seems prudent.     PAP SMEARS:  Pap smears, to check for cervical cancer or precancers,  have traditionally been done yearly, although recent scientific advances have shown that most women can have pap smears less often.  However, every woman still should have a physical exam from her gynecologist every year. It will include a breast check, inspection of the vulva and vagina to check for abnormal growths or skin changes, a visual exam of the cervix, and then an exam to evaluate the size and shape of the uterus and ovaries.  And after 23 years of age, a rectal exam is indicated to check for rectal cancers. We will also provide age appropriate advice regarding health maintenance, like when you should have certain vaccines, screening for sexually transmitted diseases, bone density testing, colonoscopy, mammograms, etc.   MAMMOGRAMS:  All women over 12 years old should have a routine mammogram.   COLON CANCER SCREENING: Now recommend starting at age 23. At this time colonoscopy is not covered for routine screening until 50. There are take home tests that can be done between 23-49.   COLONOSCOPY:  Colonoscopy to screen for colon cancer is recommended for all women at age 61.  We know, you hate the idea of the prep.  We agree, BUT, having colon cancer and not knowing it is worse!!  Colon cancer so often starts as a polyp that can be seen and removed at colonscopy, which can quite literally save your life!  And if your first colonoscopy is normal and you have no family history of colon cancer, most women don't  have to have it again for 10 years.  Once every ten years, you can do something that may end up saving your life, right?  We will be happy to help you get it scheduled when you are ready.  Be sure to check your insurance coverage so you understand  how much it will cost.  It may be covered as a preventative service at no cost, but you should check your particular policy.      Breast Self-Awareness Breast self-awareness means being familiar with how your breasts look and feel. It involves checking your breasts regularly and reporting any changes to your health care provider. Practicing breast self-awareness is important. A change in your breasts can be a sign of a serious medical problem. Being familiar with how your breasts look and feel allows you to find any problems early, when treatment is more likely to be successful. All women should practice breast self-awareness, including women who have had breast implants. How to do a breast self-exam One way to learn what is normal for your breasts and whether your breasts are changing is to do a breast self-exam. To do a breast self-exam: Look for Changes  1. Remove all the clothing above your waist. 2. Stand in front of a mirror in a room with good lighting. 3. Put your hands on your hips. 4. Push your hands firmly downward. 5. Compare your breasts in the mirror. Look for differences between them (asymmetry), such as: ? Differences in shape. ? Differences in size. ? Puckers, dips, and bumps in one breast and not the other. 6. Look at each breast for changes in your skin, such as: ? Redness. ? Scaly areas. 7. Look for changes in your nipples, such as: ? Discharge. ? Bleeding. ? Dimpling. ? Redness. ? A change in position. Feel for Changes Carefully feel your breasts for lumps and changes. It is best to do this while lying on your back on the floor and again while sitting or standing in the shower or tub with soapy water on your skin. Feel each breast in the following way:  Place the arm on the side of the breast you are examining above your head.  Feel your breast with the other hand.  Start in the nipple area and make  inch (2 cm) overlapping circles to feel your breast. Use the  pads of your three middle fingers to do this. Apply light pressure, then medium pressure, then firm pressure. The light pressure will allow you to feel the tissue closest to the skin. The medium pressure will allow you to feel the tissue that is a little deeper. The firm pressure will allow you to feel the tissue close to the ribs.  Continue the overlapping circles, moving downward over the breast until you feel your ribs below your breast.  Move one finger-width toward the center of the body. Continue to use the  inch (2 cm) overlapping circles to feel your breast as you move slowly up toward your collarbone.  Continue the up and down exam using all three pressures until you reach your armpit.  Write Down What You Find  Write down what is normal for each breast and any changes that you find. Keep a written record with breast changes or normal findings for each breast. By writing this information down, you do not need to depend only on memory for size, tenderness, or location. Write down where you are in your menstrual cycle, if you are still menstruating. If  you are having trouble noticing differences in your breasts, do not get discouraged. With time you will become more familiar with the variations in your breasts and more comfortable with the exam. How often should I examine my breasts? Examine your breasts every month. If you are breastfeeding, the best time to examine your breasts is after a feeding or after using a breast pump. If you menstruate, the best time to examine your breasts is 5-7 days after your period is over. During your period, your breasts are lumpier, and it may be more difficult to notice changes. When should I see my health care provider? See your health care provider if you notice:  A change in shape or size of your breasts or nipples.  A change in the skin of your breast or nipples, such as a reddened or scaly area.  Unusual discharge from your nipples.  A lump or  thick area that was not there before.  Pain in your breasts.  Anything that concerns you.

## 2020-05-11 ENCOUNTER — Telehealth: Payer: Self-pay | Admitting: Genetic Counselor

## 2020-05-11 NOTE — Telephone Encounter (Signed)
Scheduled appointment per 11/22 new patient referral. Spoke to patient who is aware of appointment date and time.  

## 2020-05-12 ENCOUNTER — Encounter: Payer: Self-pay | Admitting: Pulmonary Disease

## 2020-05-12 ENCOUNTER — Ambulatory Visit (INDEPENDENT_AMBULATORY_CARE_PROVIDER_SITE_OTHER): Payer: BC Managed Care – PPO | Admitting: Pulmonary Disease

## 2020-05-12 ENCOUNTER — Other Ambulatory Visit: Payer: Self-pay

## 2020-05-12 VITALS — BP 124/90 | HR 76 | Temp 97.4°F | Ht 66.0 in | Wt 276.4 lb

## 2020-05-12 DIAGNOSIS — G4733 Obstructive sleep apnea (adult) (pediatric): Secondary | ICD-10-CM

## 2020-05-12 NOTE — Progress Notes (Signed)
Kendra Allen    [id number]    February 09, 1996  Primary Care Physician:Plotnikov, Evie Lacks, MD  Referring Physician: Cassandria Anger, MD [address] [place],  [place] [address]  Chief complaint:   Patient with loud snoring  HPI:  Loud snoring, nonrestorative sleep  No witnessed apneas No choking respirations Denies dryness of the mouth in the mornings Denies headaches  Dad snores Non-smoker  Usually goes to bed between 10 PM and 12 AM Takes about 20 to 30 minutes to fall asleep 1-2 awakenings Final wake up time about 5 AM  Weight is recently up about 10 pounds   Outpatient Encounter Medications as of 05/12/2020  Medication Sig  . cephALEXin (KEFLEX) 500 MG capsule Take 1 capsule (500 mg total) by mouth 4 (four) times daily. For hydradenitis flare up  . chlorhexidine (HIBICLENS) 4 % external liquid Apply topically daily as needed. Wash 2-3 times a week  . Cholecalciferol (VITAMIN D3) 1.25 MG (50000 UT) CAPS Take 1 capsule by mouth once a week.  . Cholecalciferol (VITAMIN D3) 50 MCG (2000 UT) capsule Take 1 capsule (2,000 Units total) by mouth daily.  . clindamycin (CLEOCIN T) 1 % external solution Apply 1 application topically as needed (hidradenitis suppurativa flare).   . ferrous sulfate 325 (65 FE) MG tablet Take 1 tablet (325 mg total) by mouth daily.  . hydrOXYzine (VISTARIL) 25 MG capsule TAKE 25MG S BY MOUTH UP TO TWO TIMES DAILY AS NEEDED FOR ANXIETY  . medroxyPROGESTERone (PROVERA) 5 MG tablet Take one tablet a day x 5 days every 1-2 months if no spontaneous cycles.  . sertraline (ZOLOFT) 100 MG tablet Take 2 tablets (200 mg total) by mouth daily.  [name] spironolactone (ALDACTONE) 50 MG tablet Take 50 mg by mouth daily.   No facility-administered encounter medications on file as of 05/12/2020.    Allergies as of 05/12/2020 - Review Complete 05/12/2020  Allergen Reaction Noted  . Doxycycline  12/04/2019  . Sulfamethoxazole-trimethoprim   01/12/2010    Past Medical History:  Diagnosis Date  . Anemia   . Anxiety   . Chest pain   . Constipation   . Depression   . Hidradenitis suppurativa   . PCOS (polycystic ovarian syndrome)   . Prediabetes   . Vitamin D deficiency     Past Surgical History:  Procedure Laterality Date  . CERVICAL POLYPECTOMY    . DILATATION & CURETTAGE/HYSTEROSCOPY WITH MYOSURE N/A 11/21/2016   Procedure: DILATATION & CURETTAGE/HYSTEROSCOPY WITH MYOSURE;  [name], MD;  Location: [facility];  Service: Gynecology;  [name];    Family History  Problem Relation Age of Onset  . Hypertension Mother   . Depression Mother   . Obesity Mother   . Sleep apnea Father   . Obesity Father   . Breast cancer Sister   . Colon cancer Maternal Uncle   . Diabetes Paternal Aunt   . Prostate cancer Maternal Grandfather     Social History   Socioeconomic History  . Marital status: Single    Spouse name: Not on file  . Number of children: 0  . Years of education: Not on file  . Highest education level: Not on file  Occupational History  . Occupation: Scientist, water quality  Tobacco Use  . Smoking status: Never Smoker  . Smokeless tobacco: Never Used  Vaping Use  . Vaping Use: Never used  Substance and Sexual Activity  . Alcohol use: No    Alcohol/week:  0.0 standard drinks  . Drug use: No  . Sexual activity: Not Currently  Other Topics Concern  . Not on file  Social History Narrative  . Not on file   Social Determinants of Health   Financial Resource Strain:   . Difficulty of Paying Living Expenses: Not on file  Food Insecurity:   . Worried About Charity fundraiser in the Last Year: Not on file  . Ran Out of Food in the Last Year: Not on file  Transportation Needs:   . Lack of Transportation (Medical): Not on file  . Lack of Transportation (Non-Medical): Not on file  Physical Activity:   . Days of Exercise per Week: Not on file  . Minutes of Exercise per Session: Not on file   Stress:   . Feeling of Stress : Not on file  Social Connections:   . Frequency of Communication with Friends and Family: Not on file  . Frequency of Social Gatherings with Friends and Family: Not on file  . Attends Religious Services: Not on file  . Active Member of Clubs or Organizations: Not on file  . Attends Archivist Meetings: Not on file  . Marital Status: Not on file  Intimate Partner Violence:   . Fear of Current or Ex-Partner: Not on file  . Emotionally Abused: Not on file  . Physically Abused: Not on file  . Sexually Abused: Not on file    Review of Systems  Psychiatric/Behavioral: Positive for sleep disturbance.    Vitals:   05/12/20 1010  BP: 124/90  Pulse: 76  Temp: (!) 97.4 F (36.3 C)  SpO2: 98%     Physical Exam Constitutional:      Appearance: She is obese.  HENT:     Head: Normocephalic and atraumatic.     Mouth/Throat:     Mouth: Mucous membranes are moist.     Comments: Mallampati 3, crowded oropharynx Eyes:     General:        Right eye: No discharge.        Left eye: No discharge.  Cardiovascular:     Rate and Rhythm: Normal rate and regular rhythm.     Heart sounds: No murmur heard.  No friction rub.  Pulmonary:     Effort: Pulmonary effort is normal. No respiratory distress.     Breath sounds: Normal breath sounds. No stridor. No wheezing or rhonchi.  Musculoskeletal:     Cervical back: No rigidity or tenderness.  Neurological:     Mental Status: She is alert.  Psychiatric:        Mood and Affect: Mood normal.   Epworth Sleepiness Scale of 8  Assessment:  May have primary snoring  Possible obstructive sleep apnea  Pathophysiology of sleep disordered breathing discussed with the patient Treatment options for sleep disordered breathing discussed with the patient  Obesity  Plan/Recommendations: We will schedule patient for home sleep study  Risk of not treating sleep disordered breathing discussed with the  patient  Increase efforts at weight control encouraged  If primary snoring then behavioral modifications to help snoring should be put in place   Call with significant concerns  Tentative follow-up in 3 months   Sherrilyn Rist MD [facility] 05/12/2020, 10:30 AM  CC: Plotnikov, Evie Lacks, MD

## 2020-05-12 NOTE — Patient Instructions (Signed)
Loud snoring Possible obstructive sleep apnea  We will schedule you for home sleep study  update you with results as soon as reviewed  Continue weight loss efforts   Call with significant concerns Sleep Apnea Sleep apnea is a condition in which breathing pauses or becomes shallow during sleep. Episodes of sleep apnea usually last 10 seconds or longer, and they may occur as many as 20 times an hour. Sleep apnea disrupts your sleep and keeps your body from getting the rest that it needs. This condition can increase your risk of certain health problems, including:  Heart attack.  Stroke.  Obesity.  Diabetes.  Heart failure.  Irregular heartbeat. What are the causes? There are three kinds of sleep apnea:  Obstructive sleep apnea. This kind is caused by a blocked or collapsed airway.  Central sleep apnea. This kind happens when the part of the brain that controls breathing does not send the correct signals to the muscles that control breathing.  Mixed sleep apnea. This is a combination of obstructive and central sleep apnea. The most common cause of this condition is a collapsed or blocked airway. An airway can collapse or become blocked if:  Your throat muscles are abnormally relaxed.  Your tongue and tonsils are larger than normal.  You are overweight.  Your airway is smaller than normal. What increases the risk? You are more likely to develop this condition if you:  Are overweight.  Smoke.  Have a smaller than normal airway.  Are elderly.  Are female.  Drink alcohol.  Take sedatives or tranquilizers.  Have a family history of sleep apnea. What are the signs or symptoms? Symptoms of this condition include:  Trouble staying asleep.  Daytime sleepiness and tiredness.  Irritability.  Loud snoring.  Morning headaches.  Trouble concentrating.  Forgetfulness.  Decreased interest in sex.  Unexplained sleepiness.  Mood swings.  Personality  changes.  Feelings of depression.  Waking up often during the night to urinate.  Dry mouth.  Sore throat. How is this diagnosed? This condition may be diagnosed with:  A medical history.  A physical exam.  A series of tests that are done while you are sleeping (sleep study). These tests are usually done in a sleep lab, but they may also be done at home. How is this treated? Treatment for this condition aims to restore normal breathing and to ease symptoms during sleep. It may involve managing health issues that can affect breathing, such as high blood pressure or obesity. Treatment may include:  Sleeping on your side.  Using a decongestant if you have nasal congestion.  Avoiding the use of depressants, including alcohol, sedatives, and narcotics.  Losing weight if you are overweight.  Making changes to your diet.  Quitting smoking.  Using a device to open your airway while you sleep, such as: ? An oral appliance. This is a custom-made mouthpiece that shifts your lower jaw forward. ? A continuous positive airway pressure (CPAP) device. This device blows air through a mask when you breathe out (exhale). ? A nasal expiratory positive airway pressure (EPAP) device. This device has valves that you put into each nostril. ? A bi-level positive airway pressure (BPAP) device. This device blows air through a mask when you breathe in (inhale) and breathe out (exhale).  Having surgery if other treatments do not work. During surgery, excess tissue is removed to create a wider airway. It is important to get treatment for sleep apnea. Without treatment, this condition can lead to:  High blood pressure.  Coronary artery disease.  In men, an inability to achieve or maintain an erection (impotence).  Reduced thinking abilities. Follow these instructions at home: Lifestyle  Make any lifestyle changes that your health care provider recommends.  Eat a healthy, well-balanced  diet.  Take steps to lose weight if you are overweight.  Avoid using depressants, including alcohol, sedatives, and narcotics.  Do not use any products that contain nicotine or tobacco, such as cigarettes, e-cigarettes, and chewing tobacco. If you need help quitting, ask your health care provider. General instructions  Take over-the-counter and prescription medicines only as told by your health care provider.  If you were given a device to open your airway while you sleep, use it only as told by your health care provider.  If you are having surgery, make sure to tell your health care provider you have sleep apnea. You may need to bring your device with you.  Keep all follow-up visits as told by your health care provider. This is important. Contact a health care provider if:  The device that you received to open your airway during sleep is uncomfortable or does not seem to be working.  Your symptoms do not improve.  Your symptoms get worse. Get help right away if:  You develop: ? Chest pain. ? Shortness of breath. ? Discomfort in your back, arms, or stomach.  You have: ? Trouble speaking. ? Weakness on one side of your body. ? Drooping in your face. These symptoms may represent a serious problem that is an emergency. Do not wait to see if the symptoms will go away. Get medical help right away. Call your local emergency services (911 in the [place] Do not drive yourself to the hospital. Summary  Sleep apnea is a condition in which breathing pauses or becomes shallow during sleep.  The most common cause is a collapsed or blocked airway.  The goal of treatment is to restore normal breathing and to ease symptoms during sleep. This information is not intended to replace advice given to you by your health care provider. Make sure you discuss any questions you have with your health care provider. Document Revised: 11/20/2018 Document Reviewed: 01/29/2018 Elsevier Patient Education   [employer].

## 2020-06-15 ENCOUNTER — Inpatient Hospital Stay: Payer: BC Managed Care – PPO | Attending: Genetic Counselor | Admitting: Genetic Counselor

## 2020-06-15 ENCOUNTER — Other Ambulatory Visit: Payer: Self-pay

## 2020-06-15 ENCOUNTER — Inpatient Hospital Stay: Payer: BC Managed Care – PPO

## 2020-06-15 ENCOUNTER — Encounter: Payer: Self-pay | Admitting: Genetic Counselor

## 2020-06-15 DIAGNOSIS — Z803 Family history of malignant neoplasm of breast: Secondary | ICD-10-CM

## 2020-06-15 DIAGNOSIS — Z800 Family history of malignant neoplasm of digestive organs: Secondary | ICD-10-CM | POA: Diagnosis not present

## 2020-06-15 DIAGNOSIS — Z8042 Family history of malignant neoplasm of prostate: Secondary | ICD-10-CM | POA: Diagnosis not present

## 2020-06-15 DIAGNOSIS — Z8041 Family history of malignant neoplasm of ovary: Secondary | ICD-10-CM | POA: Diagnosis not present

## 2020-06-15 NOTE — Progress Notes (Signed)
REFERRING PROVIDER: Salvadore Dom, [address],  [place] [address]  PRIMARY PROVIDER:  Plotnikov, Evie Lacks, MD  PRIMARY REASON FOR VISIT:  1. Family history of breast cancer   2. Family history of colon cancer   3. Family history of prostate cancer   4. Family history of ovarian cancer       HISTORY OF PRESENT ILLNESS:   Kendra Allen, a 24 y.o. female, was seen for a [facility] cancer genetics consultation at the request of Dr. Talbert Nan due to a family history of cancer.  Kendra Allen presents to clinic today to discuss the possibility of a hereditary predisposition to cancer, genetic testing, and to further clarify her future cancer risks, as well as potential cancer risks for family members.   Kendra Allen does not have a personal history of cancer.    RISK FACTORS:  Menarche was at age 50.  No live births.  OCP use for approximately 1 month.  Ovaries intact: yes.  Hysterectomy: no.  Menopausal status: premenopausal.  HRT use: 0 years. Colonoscopy: no. Mammogram within the last year: yes. Number of breast biopsies: 0. Up to date with pelvic exams: yes. Any excessive radiation exposure in the past: no  Past Medical History:  Diagnosis Date  . Anemia   . Anxiety   . Chest pain   . Constipation   . Depression   . Family history of breast cancer   . Family history of colon cancer   . Family history of ovarian cancer   . Family history of prostate cancer   . Hidradenitis suppurativa   . PCOS (polycystic ovarian syndrome)   . Prediabetes   . Vitamin D deficiency     Past Surgical History:  Procedure Laterality Date  . CERVICAL POLYPECTOMY    . DILATATION & CURETTAGE/HYSTEROSCOPY WITH MYOSURE N/A 11/21/2016   Procedure: DILATATION & CURETTAGE/HYSTEROSCOPY WITH MYOSURE;  [name], MD;  Location: [facility];  Service: Gynecology;  [name];    Social History   Socioeconomic History  . Marital status: Single    Spouse  name: Not on file  . Number of children: 0  . Years of education: Not on file  . Highest education level: Not on file  Occupational History  . Occupation: Scientist, water quality  Tobacco Use  . Smoking status: Never Smoker  . Smokeless tobacco: Never Used  Vaping Use  . Vaping Use: Never used  Substance and Sexual Activity  . Alcohol use: No    Alcohol/week: 0.0 standard drinks  . Drug use: No  . Sexual activity: Not Currently  Other Topics Concern  . Not on file  Social History Narrative  . Not on file   Social Determinants of Health   Financial Resource Strain: Not on file  Food Insecurity: Not on file  Transportation Needs: Not on file  Physical Activity: Not on file  Stress: Not on file  Social Connections: Not on file     FAMILY HISTORY:  We obtained a detailed, 4-generation family history.  Significant diagnoses are listed below: Family History  Problem Relation Age of Onset  . Hypertension Mother   . Depression Mother   . Obesity Mother   . Sleep apnea Father   . Obesity Father   . Breast cancer Half-Sister 66  . Colon cancer Maternal Uncle 30  . Diabetes Paternal Aunt   . Prostate cancer Maternal Grandfather        dx 44s, metastatic  .  Heart attack Maternal Grandmother   . Stroke Paternal Grandfather   . Breast cancer Other        multiple of mother's paternal cousins  . Ovarian cancer Other        multiple of mother's paternal cousins  . Prostate cancer Other        multiple of mother's paternal cousins  . Ovarian cancer Maternal Great-grandmother        dx ?, MGF's mother   Kendra Allen does not have any children. She has one full-brother and one maternal half-sister. Her half-sister has a history of breast cancer diagnosed at age 81. She had genetic testing approximately 10 years ago, although these results are unavailable for review and Kendra Allen does not currently have contact with this sister.  Kendra Allen mother is 51 and has not had cancer. Kendra Allen has three  maternal aunts and one maternal uncle. Her uncle died at age 24 from colon cancer. Her maternal grandmother died at age 62 from a heart attack. Her maternal grandfather died at age 69 from metastatic prostate cancer. Her grandfather's mother reportedly died from ovarian cancer. Additionally, three of her grandfather's siblings had cancer (unknown types), and at least seven of her grandfather's nieces/nephews died from cancer (most of which was breast, ovarian, prostate, and lung cancer).   Kendra Allen father is 64 and has not had cancer. She has two paternal aunts. Her paternal grandmother is alive at age 42, and her paternal grandfather died at age 50 from a stroke. She notes that most paternal relatives have had hypertension, diabetes, stroke, and/or heart attacks.   Kendra Allen is aware of previous family history of genetic testing for hereditary cancer risks. Patient's maternal and paternal ancestors are Research scientist (physical sciences). There is no reported Ashkenazi Jewish ancestry. There is no known consanguinity.  GENETIC COUNSELING ASSESSMENT: Kendra Allen is a 24 y.o. female with a family history of breast, ovarian, prostate, and colon cancer, which is somewhat suggestive of a hereditary cancer syndrome and predisposition to cancer. We, therefore, discussed and recommended the following at today's visit.   DISCUSSION: We discussed that approximately 5-10% of breast cancer is hereditary, with most cases associated with the BRCA1 and BRCA2 genes. There are other genes that can be associated with hereditary breast cancer syndromes. These include ATM, CHEK2, PALB2, etc. We discussed that testing is beneficial for several reasons, including knowing about other cancer risks, identifying potential screening and risk-reduction options that may be appropriate, and to understand if other family members could be at risk for cancer and allow them to undergo genetic testing.   We reviewed the characteristics, features and  inheritance patterns of hereditary cancer syndromes. We also discussed genetic testing, including the appropriate family members to test, the process of testing, insurance coverage and turn-around-time for results. We discussed the implications of a negative, positive and/or variant of uncertain significant result. We recommended Kendra Allen pursue genetic testing for the [facility] CustomNext-Cancer + RNAinsight gene panel.   The CustomNext-Cancer+RNAinsight panel offered by [facility] includes sequencing and rearrangement analysis for the following 47 genes:  APC, ATM, AXIN2, BARD1, BMPR1A, BRCA1, BRCA2, BRIP1, CDH1, CDK4, CDKN2A, CHEK2, DICER1, EPCAM, GREM1, HOXB13, MEN1, MLH1, MSH2, MSH3, MSH6, MUTYH, NBN, NF1, NF2, NTHL1, PALB2, PMS2, POLD1, POLE, PTEN, RAD51C, RAD51D, RECQL, RET, SDHA, SDHAF2, SDHB, SDHC, SDHD, SMAD4, SMARCA4, STK11, TP53, TSC1, TSC2, and VHL.  RNA data is routinely analyzed for use in variant interpretation for all genes.  Based on Kendra Allen family history  of cancer, she meets medical criteria for genetic testing. Despite that she meets criteria, there may still be an out of pocket cost. We discussed that if her out of pocket cost for testing is over $100, the laboratory will reach out to let her know. If the out of pocket cost of testing is less than $100 she will be billed by the genetic testing laboratory.   Lastly, we discussed that some people do not want to undergo genetic testing due to fear of genetic discrimination.  A federal law called the Genetic Information Non-Discrimination Act (GINA) of 2008 helps protect individuals against genetic discrimination based on their genetic test results.  It impacts both health insurance and employment.  With health insurance, it protects against increased premiums, being kicked off insurance or being forced to take a test in order to be insured.  For employment it protects against hiring, firing and promoting decisions based on genetic test  results.  Health status due to a cancer diagnosis is not protected under GINA. Importantly, life, disability, and long-term care insurance is not protected under GINA.   PLAN: After considering the risks, benefits, and limitations, Kendra Allen provided informed consent to pursue genetic testing and the blood sample was sent to [facility] for analysis of the CustomNext-Cancer + RNAinsight panel. Results should be available within approximately two-three weeks' time, at which point they will be disclosed by telephone to Kendra Allen, as will any additional recommendations warranted by these results. Kendra Allen will receive a summary of her genetic counseling visit and a copy of her results once available. This information will also be available in Epic.   Lastly, we encouraged Kendra Allen to remain in contact with cancer genetics annually so that we can continuously update the family history and inform her of any changes in cancer genetics and testing that may be of benefit for this family.   Kendra Allen questions were answered to her satisfaction today. Our contact information was provided should additional questions or concerns arise. Thank you for the referral and allowing [place] to share in the care of your patient.   Kendra Allen, [place], [facility] Licensed, Certified Dispensing [email or web address] Phone: [phone]  The patient was seen for a total of 45 minutes in face-to-face genetic counseling.  This patient was discussed with Drs. Magrinat, Lindi Adie and/or Burr Medico who agrees with the above.    _______________________________________________________________________ For Office Staff:  Number of people involved in session: 1 Was an Intern/ student involved with case: no

## 2020-07-02 ENCOUNTER — Ambulatory Visit: Payer: BC Managed Care – PPO

## 2020-07-02 ENCOUNTER — Other Ambulatory Visit: Payer: Self-pay

## 2020-07-02 DIAGNOSIS — G4733 Obstructive sleep apnea (adult) (pediatric): Secondary | ICD-10-CM | POA: Diagnosis not present

## 2020-07-02 DIAGNOSIS — Z1379 Encounter for other screening for genetic and chromosomal anomalies: Secondary | ICD-10-CM | POA: Insufficient documentation

## 2020-07-05 ENCOUNTER — Encounter: Payer: Self-pay | Admitting: Genetic Counselor

## 2020-07-05 ENCOUNTER — Ambulatory Visit: Payer: Self-pay | Admitting: Genetic Counselor

## 2020-07-05 ENCOUNTER — Telehealth: Payer: Self-pay | Admitting: Genetic Counselor

## 2020-07-05 DIAGNOSIS — Z1379 Encounter for other screening for genetic and chromosomal anomalies: Secondary | ICD-10-CM

## 2020-07-05 NOTE — Telephone Encounter (Signed)
Revealed negative genetic testing. Discussed that we do not know why there is cancer in the family. There could be a genetic mutation in the family that Kendra Allen did not inherit. There could also be a mutation in a different gene that we are not testing, or our current technology may not be able detect certain mutations. It will therefore be important for her to stay in contact with genetics to keep up with whether additional testing may be appropriate in the future.

## 2020-07-05 NOTE — Progress Notes (Addendum)
HPI:  Ms. Meenan was previously seen in the [place] clinic due to a family history of cancer and concerns regarding a hereditary predisposition to cancer. Please refer to our prior cancer genetics clinic note for more information regarding our discussion, assessment and recommendations, at the time. Ms. Bhargava recent genetic test results were disclosed to her, as were recommendations warranted by these results. These results and recommendations are discussed in more detail below.  FAMILY HISTORY:  We obtained a detailed, 4-generation family history.  Significant diagnoses are listed below: Family History  Problem Relation Age of Onset  . Hypertension Mother   . Depression Mother   . Obesity Mother   . Sleep apnea Father   . Obesity Father   . Breast cancer Half-Sister 56  . Colon cancer Maternal Uncle 88  . Diabetes Paternal Aunt   . Prostate cancer Maternal Grandfather        dx 55s, metastatic  . Heart attack Maternal Grandmother   . Stroke Paternal Grandfather   . Breast cancer Other        multiple of mother's paternal cousins  . Ovarian cancer Other        multiple of mother's paternal cousins  . Prostate cancer Other        multiple of mother's paternal cousins  . Ovarian cancer Maternal Great-grandmother        dx ?, MGF's mother   Ms. Revak does not have any children. She has one full-brother and one maternal half-sister. Her half-sister has a history of breast cancer diagnosed at age 62. She had genetic testing approximately 10 years ago, although these results are unavailable for review and Ms. Komar does not currently have contact with this sister.  Ms. Corker mother is 4 and has not had cancer. Ms. Folker has three maternal aunts and one maternal uncle. Her uncle died at age 61 from colon cancer. Her maternal grandmother died at age 51 from a heart attack. Her maternal grandfather died at age 18 from metastatic prostate cancer. Her grandfather's mother  reportedly died from ovarian cancer. Additionally, three of her grandfather's siblings had cancer (unknown types), and at least seven of her grandfather's nieces/nephews died from cancer (most of which was breast, ovarian, prostate, and lung cancer).   Ms. Cech father is 58 and has not had cancer. She has two paternal aunts. Her paternal grandmother is alive at age 46, and her paternal grandfather died at age 69 from a stroke. She notes that most paternal relatives have had hypertension, diabetes, stroke, and/or heart attacks.   Ms. Sandoz is aware of previous family history of genetic testing for hereditary cancer risks. Patient's maternal and paternal ancestors are Research scientist (physical sciences). There is no reported Ashkenazi Jewish ancestry. There is no known consanguinity.  GENETIC TEST RESULTS: Genetic testing reported out on 07/02/2020 through the [facility]Next-Cancer + RNAinsight panel. No pathogenic variants were detected.   The CustomNext-Cancer+RNAinsight panel offered by [facility] includes sequencing and rearrangement analysis for the following 47 genes:  APC, ATM, AXIN2, BARD1, BMPR1A, BRCA1, BRCA2, BRIP1, CDH1, CDK4, CDKN2A, CHEK2, DICER1, EPCAM, GREM1, HOXB13, MEN1, MLH1, MSH2, MSH3, MSH6, MUTYH, NBN, NF1, NF2, NTHL1, PALB2, PMS2, POLD1, POLE, PTEN, RAD51C, RAD51D, RECQL, RET, SDHA, SDHAF2, SDHB, SDHC, SDHD, SMAD4, SMARCA4, STK11, TP53, TSC1, TSC2, and VHL.  RNA data is routinely analyzed for use in variant interpretation for all genes. The test report will be scanned into EPIC and located under the Molecular Pathology section of the Results  Review tab.  A portion of the result report is included below for reference.     We discussed with Ms. Denardo that because current genetic testing is not perfect, it is possible there may be a gene mutation in one of these genes that current testing cannot detect, but that chance is small.  We also discussed that there could be another gene that has  not yet been discovered, or that we have not yet tested, that is responsible for the cancer diagnoses in the family. It is also possible there is a hereditary cause for the cancer in the family that Ms. Hou did not inherit and therefore was not identified in her testing.  Therefore, it is important to remain in touch with cancer genetics in the future so that we can continue to offer Ms. Briney the most up to date genetic testing.   CANCER SCREENING RECOMMENDATIONS: Ms. Kalt test result is considered negative (normal). This means that we have not identified a hereditary cause for her family history of cancer at this time. While reassuring, this does not definitively rule out a hereditary predisposition to cancer. It is still possible that there could be genetic mutations that are undetectable by current technology. There could be genetic mutations in genes that have not been tested or identified to increase cancer risk. Therefore, it is recommended she continue to follow the cancer management and screening guidelines provided by her primary healthcare provider.   An individual's cancer risk and medical management are not determined by genetic test results alone. Overall cancer risk assessment incorporates additional factors, including personal medical history, family history, and any available genetic information that may result in a personalized plan for cancer prevention and surveillance.  Based on Ms. Mowbray personal and family history, as well as her genetic test results, a statistical model Midwife) was used to estimate her risk of developing breast cancer. Tyrer-Cuzick estimates her lifetime risk of developing breast cancer to be approximately 20.3%.  This lifetime breast cancer risk is a preliminary estimate based on available information using one of several models endorsed by the [place] (ACS). The ACS recommends consideration of breast MRI screening as an adjunct to  mammography for patients at high risk (defined as 20% or greater lifetime risk). A more detailed breast cancer risk assessment can be considered, if clinically indicated.   Ms. Schmaltz has been determined to be at high risk for breast cancer. Therefore, we recommend that she proceed with the following breast cancer screening guidelines (per NCCN Breast Cancer Screening and Guidelines Version 1.2021):  Clinical encounter every 6-12 months - to begin when identified as being at increased risk, but not prior to age 4y  Annual screening mammogram, consider tomosynthesis - to begin 10 years prior to when the youngest family member was diagnosed with breast cancer, not prior to age 16y, or age 19y (whichever comes first)*  Annual breast MRI - to begin 10 years prior to when the youngest family member was diagnosed with breast cancer, not prior to age 42y, or age 29y (whichever comes first)*  Consider risk reduction strategies *Except in the rare circumstance of a family history of very early-onset breast cancer before the age of 8y.  We discussed that Ms. Mocarski should discuss her individual situation with her referring physician and determine a breast cancer screening plan with which they are both comfortable.       RECOMMENDATIONS FOR FAMILY MEMBERS:  Individuals in this family might be at some increased  risk of developing cancer, over the general population risk, simply due to the family history of cancer.  We recommended women in this family have a yearly mammogram beginning at age 15, or 14 years younger than the earliest onset of cancer, an annual clinical breast exam, and perform monthly breast self-exams. Women in this family should also have a gynecological exam as recommended by their primary provider. All family members should be referred for colonoscopy starting at age 2.  FOLLOW-UP: Lastly, we discussed with Ms. Hollinger that cancer genetics is a rapidly advancing field and it is possible that  new genetic tests will be appropriate for her and/or her family members in the future. We encouraged her to remain in contact with cancer genetics on an annual basis so we can update her personal and family histories and let her know of advances in cancer genetics that may benefit this family.   Our contact number was provided. Ms. Sheller questions were answered to her satisfaction, and she knows she is welcome to call us at anytime with additional questions or concerns.   Clint Guy, MS, [facility] Genetic Counselor [email or web address] Phone: [phone]

## 2020-07-13 ENCOUNTER — Telehealth: Payer: Self-pay | Admitting: Obstetrics and Gynecology

## 2020-07-13 NOTE — Telephone Encounter (Signed)
Please let the patient know that given her elevated risk of breast cancer she should start having yearly breast MRI's now. If she is okay with that plan please schedule.  Thank you!

## 2020-07-13 NOTE — Telephone Encounter (Signed)
-----   Message from Clint Guy sent at 6/38/9373 10:30 AM EST ----- Hi Dr. Talbert Nan,  I discussed this case with our high-risk breast providers and they advised that it is reasonable for her to start annual breast MRIs now, given the very young diagnosis of breast cancer in the family.  Let me know if you have any other questions, Raquel Sarna  ----- Message ----- From: Salvadore Dom, MD Sent: 07/12/2020   3:37 PM EST To: Alfredo Batty, Given that her half sister had breast cancer at 93, I assume you would recommend that Helena starts MRI's at 42? Thanks, Sharee Pimple ----- Message ----- From: Clint Guy Sent: 10/15/7679   2:01 PM EST To: Salvadore Dom, MD  Ms. Deats had negative genetic testing. She is still considered high risk for breast cancer based on Tyrer-Cuzick risk score.  Thank you, Raquel Sarna

## 2020-07-14 NOTE — Telephone Encounter (Signed)
Patient informed with below note. Patient will call back if plans to schedule.

## 2020-07-16 ENCOUNTER — Telehealth: Payer: Self-pay | Admitting: Pulmonary Disease

## 2020-07-16 DIAGNOSIS — G4733 Obstructive sleep apnea (adult) (pediatric): Secondary | ICD-10-CM | POA: Diagnosis not present

## 2020-07-16 NOTE — Telephone Encounter (Signed)
Call patient  Sleep study result  Date of study: 07/02/2020  Impression: Mild obstructive sleep apnea Mild oxygen desaturations  Recommendation: Options of treatment for mild obstructive sleep apnea will include  1 CPAP therapy  2 An oral device 3 watchful waiting   If CPAP is chosen as a option of treatment, CPAP pressure setting of 5-15 will be appropriate   I will recommend CPAP therapy

## 2020-07-20 ENCOUNTER — Encounter: Payer: Self-pay | Admitting: Obstetrics and Gynecology

## 2020-07-20 NOTE — Telephone Encounter (Signed)
ATC patient about sleep study results, per DPR left detailed message and advised patient to call back so recs can be discussed further 

## 2020-07-21 ENCOUNTER — Other Ambulatory Visit: Payer: Self-pay

## 2020-07-21 ENCOUNTER — Ambulatory Visit: Payer: BC Managed Care – PPO | Admitting: Nurse Practitioner

## 2020-07-21 ENCOUNTER — Encounter: Payer: Self-pay | Admitting: Nurse Practitioner

## 2020-07-21 VITALS — BP 126/80

## 2020-07-21 DIAGNOSIS — R1031 Right lower quadrant pain: Secondary | ICD-10-CM | POA: Diagnosis not present

## 2020-07-21 DIAGNOSIS — E282 Polycystic ovarian syndrome: Secondary | ICD-10-CM

## 2020-07-21 NOTE — Progress Notes (Addendum)
   Acute Office Visit  Subjective:    Patient ID: Kendra Allen, female    DOB: 1996-04-11, 25 y.o.   MRN: [id number]   HPI 25 y.o. presents today for right lower quadrant abdominal pain that started yesterday. She has a history of irregular cycles due to PCOS and currently takes Provera every other month to initiate menses. Took last dose of Provera 5 days ago and started having light bleeding yesterday around the time that pain began. She describes pain as constant, severe at times, sharp and crampy. She also experienced nausea and vomiting with pain yesterday. She took 1000 mg of Ibuprofen yesterday and 800 mg today with some relief. She is still having pain today but she says it is somewhat better but thinks it may be due to Ibuprofen. Today she has moderate brown blood. History of benign endometrial polyps in 2018.    Review of Systems  Constitutional: Negative.   Gastrointestinal: Positive for abdominal pain, nausea and vomiting.  Genitourinary: Positive for menstrual problem and vaginal bleeding.       Objective:    Physical Exam Constitutional:      Appearance: Normal appearance. She is obese.  Abdominal:     Tenderness: There is abdominal tenderness in the right lower quadrant. There is no guarding or rebound.  Genitourinary:    General: Normal vulva.     Vagina: Bleeding present.     Cervix: Normal.     Uterus: Normal.      BP 126/80 (BP Location: Right Arm, Patient Position: Sitting, Cuff Size: Large)   LMP 07/20/2020  Wt Readings from Last 3 Encounters:  05/12/20 276 lb 6.4 oz (125.4 kg)  05/10/20 278 lb 8 oz (126.3 kg)  04/09/20 275 lb (124.7 kg)        Assessment & Plan:   Problem List Items Addressed This Visit   None   Visit Diagnoses    Right lower quadrant abdominal pain    -  Primary   Relevant Orders   US PELVIS TRANSVAGINAL NON-OB (TV ONLY)     Plan: Symptoms sound cystic in nature. We will schedule pelvic ultrasound for further evaluation  and have her follow up with Dr. Talbert Nan afterwards. We discussed ovarian cysts and that many times when they are larger in size they can cause pain and when they rupture pain can be severe and linger for a couple of days. Continue Ibuprofen as needed for pain - 600-800 mg every 8 hours. She is agreeable to plan.      Tamela Gammon [facility], 4:43 PM 07/21/2020

## 2020-07-23 NOTE — Telephone Encounter (Signed)
Called and spoke with patient to let her know of her sleep study results and went over therapy options. She would like to see if she is a candidate for the oral device. Referral has been placed for Dr. Toy Cookey. Advised patient that if she wasn't a candidate or it wasn't covered then to let us know so that we can put in order for cpap. She expressed understanding. Nothing further needed at this time.

## 2020-08-01 ENCOUNTER — Other Ambulatory Visit: Payer: Self-pay | Admitting: Internal Medicine

## 2020-08-01 ENCOUNTER — Other Ambulatory Visit: Payer: Self-pay | Admitting: Obstetrics and Gynecology

## 2020-08-17 ENCOUNTER — Encounter: Payer: Self-pay | Admitting: Obstetrics and Gynecology

## 2020-08-17 ENCOUNTER — Other Ambulatory Visit: Payer: Self-pay

## 2020-08-17 ENCOUNTER — Ambulatory Visit (INDEPENDENT_AMBULATORY_CARE_PROVIDER_SITE_OTHER): Payer: BC Managed Care – PPO

## 2020-08-17 ENCOUNTER — Ambulatory Visit: Payer: BC Managed Care – PPO | Admitting: Obstetrics and Gynecology

## 2020-08-17 VITALS — HR 88 | Ht 66.0 in | Wt 282.0 lb

## 2020-08-17 DIAGNOSIS — R1031 Right lower quadrant pain: Secondary | ICD-10-CM

## 2020-08-17 DIAGNOSIS — D259 Leiomyoma of uterus, unspecified: Secondary | ICD-10-CM | POA: Diagnosis not present

## 2020-08-17 DIAGNOSIS — N83201 Unspecified ovarian cyst, right side: Secondary | ICD-10-CM | POA: Diagnosis not present

## 2020-08-17 NOTE — Progress Notes (Signed)
GYNECOLOGY  VISIT   HPI: 25 y.o.   Single Black or African American Not Hispanic or Latino  female   [id number] with Patient's last menstrual period was 07/20/2020.   here for ultrasound consult.    She was seen last month with severe pain in her right pelvis. The pain was so severe she had emesis. That severe pain lasted x 4-5 hours. She was seen here the next day and at that point the pain was much improved.   H/O oligomenorrhea, she takes provera every other month. Last cycle was a month ago and was light. Previously she has had heavier bleeding.   GYNECOLOGIC HISTORY: Patient's last menstrual period was 07/20/2020. Contraception:none not sexually active Menopausal hormone therapy: none        OB History    Gravida  0   Para  0   Term  0   Preterm  0   AB  0   Living  0     SAB  0   IAB  0   Ectopic  0   Multiple  0   Live Births  0              Patient Active Problem List   Diagnosis Date Noted  . Genetic testing 07/02/2020  . Family history of breast cancer   . Family history of colon cancer   . Family history of prostate cancer   . Family history of ovarian cancer   . Prediabetes   . Well adult exam 04/12/2020  . Snorings 04/12/2020  . PCOS (polycystic ovarian syndrome) 12/04/2019  . Vitamin D deficiency 03/31/2019  . Hyperglycemia 03/31/2019  . Anemia 01/21/2018  . Midsternal chest pain 11/26/2015  . Pain in joint, ankle and foot 03/30/2015  . Apathy 03/26/2014  . Headache 03/26/2014  . PMS (premenstrual syndrome) 01/26/2014  . Anxiety and depression 01/26/2014  . Fever and chills 09/10/2013  . Fatigue 09/10/2013  . Anxiety attack 06/09/2013  . Unspecified constipation 04/02/2013  . Diarrhea 04/02/2013  . Allergic rhinitis 02/01/2012  . Otitis media 12/12/2011  . Obesity 03/30/2010  . Hidradenitis 12/02/2009    Past Medical History:  Diagnosis Date  . Anemia   . Anxiety   . Chest pain   . Constipation   . Depression   . Family  history of breast cancer   . Family history of colon cancer   . Family history of ovarian cancer   . Family history of prostate cancer   . Hidradenitis suppurativa   . PCOS (polycystic ovarian syndrome)   . Prediabetes   . Vitamin D deficiency     Past Surgical History:  Procedure Laterality Date  . CERVICAL POLYPECTOMY    . DILATATION & CURETTAGE/HYSTEROSCOPY WITH MYOSURE N/A 11/21/2016   Procedure: DILATATION & CURETTAGE/HYSTEROSCOPY WITH MYOSURE;  [name], MD;  Location: [facility];  Service: Gynecology;  [name];    Current Outpatient Medications  Medication Sig Dispense Refill  . chlorhexidine (HIBICLENS) 4 % external liquid Apply topically daily as needed. Wash 2-3 times a week 473 mL 1  . clindamycin (CLEOCIN T) 1 % external solution Apply 1 application topically as needed (hidradenitis suppurativa flare).     . ferrous sulfate 325 (65 FE) MG tablet Take 1 tablet (325 mg total) by mouth daily. 90 tablet 1  . hydrOXYzine (VISTARIL) 25 MG capsule TAKE 25MG S BY MOUTH UP TO TWO TIMES DAILY AS NEEDED FOR ANXIETY 180 capsule 0  . medroxyPROGESTERone (PROVERA) 5  MG tablet Take one tablet a day x 5 days every 1-2 months if no spontaneous cycles. 15 tablet 3  . sertraline (ZOLOFT) 100 MG tablet Take 2 tablets (200 mg total) by mouth daily. 180 tablet 3  . spironolactone (ALDACTONE) 50 MG tablet Take 50 mg by mouth daily.    [name] VITAMIN D PO Take by mouth.     No current facility-administered medications for this visit.     ALLERGIES: Doxycycline and Sulfamethoxazole-trimethoprim  Family History  Problem Relation Age of Onset  . Hypertension Mother   . Depression Mother   . Obesity Mother   . Sleep apnea Father   . Obesity Father   . Breast cancer Half-Sister 16  . Colon cancer Maternal Uncle 59  . Diabetes Paternal Aunt   . Prostate cancer Maternal Grandfather        dx 2s, metastatic  . Heart attack Maternal Grandmother   . Stroke Paternal  Grandfather   . Breast cancer Other        multiple of mother's paternal cousins  . Ovarian cancer Other        multiple of mother's paternal cousins  . Prostate cancer Other        multiple of mother's paternal cousins  . Ovarian cancer Maternal Great-grandmother        dx ?, MGF's mother    Social History   Socioeconomic History  . Marital status: Single    Spouse name: Not on file  . Number of children: 0  . Years of education: Not on file  . Highest education level: Not on file  Occupational History  . Occupation: Scientist, water quality  Tobacco Use  . Smoking status: Never Smoker  . Smokeless tobacco: Never Used  Vaping Use  . Vaping Use: Never used  Substance and Sexual Activity  . Alcohol use: No    Alcohol/week: 0.0 standard drinks  . Drug use: No  . Sexual activity: Not Currently  Other Topics Concern  . Not on file  Social History Narrative  . Not on file   Social Determinants of Health   Financial Resource Strain: Not on file  Food Insecurity: Not on file  Transportation Needs: Not on file  Physical Activity: Not on file  Stress: Not on file  Social Connections: Not on file  Intimate Partner Violence: Not on file    Review of Systems  All other systems reviewed and are negative.   PHYSICAL EXAMINATION:    LMP 07/20/2020     General appearance: alert, cooperative and appears stated age  Pelvic ultrasound  Indications: pelvic pain  Findings:  Uterus 8.82 x 6.3 x 4.53 cm  Fibroids:  1) 2.25 x 2.09 cm, intramural  2) 1.62 x 1.33 cm, intramural  3) 1.54 x 1.51 cm, intramural  4) 2.07 x 1.76 cm, intramural  5) 2.73 x 2.71 cm, pedunculated (right lateral uterus)  Endometrium 5.76 mm  Left ovary 3.88 x 2.95 x 2.31 cm  Right ovary 7.25 x 6.98 x 6.9 cm  6.71 x 4.76 cm complex right ovarian cyst. 2 solid areas noted: 19 x 15 mm and 16 x 16 mm. No increased vascularity.   no free fluid   Impression:  Anteverted uterus with multiple myomas, non  impinging on the uterine cavity Complex right ovarian cyst, suspect hemorrhagic CL  1. Right ovarian cyst Large, complex right ovarian cyst, no internal flow. Suspect resolving hemorrhagic CL Discussed risk of torsion and rupture. - US PELVIS TRANSVAGINAL NON-OB (TV  ONLY); Future in 6 weeks  2. Uterine leiomyoma, unspecified location Discussed fibroids, information given  In addition to reviewing the ultrasound over 10 minutes was spent discussing the risks and management of right ovarian cyst and need for f/u imaging. Information given on fibroids and ovarian cysts.

## 2020-08-17 NOTE — Patient Instructions (Signed)
Ovarian Cyst  An ovarian cyst is a fluid-filled sac that forms on an ovary. The ovaries are small organs that produce eggs in women. Various types of cysts can form on the ovaries. Some may cause symptoms and require treatment. Most ovarian cysts go away on their own, are not cancerous (are benign), and do not cause problems. What are the causes? Ovarian cysts may be caused by:  Ovarian hyperstimulation syndrome. This is a condition that can develop from taking fertility medicines. It causes multiple large ovarian cysts to form.  Polycystic ovarian syndrome (PCOS). This is a common hormonal disorder that can cause ovarian cysts to form, and can cause problems with your period or fertility.  The normal menstrual cycle. What increases the risk? The following factors may make you more likely to develop this condition:  Being overweight or obese.  Taking fertility medicines.  Taking certain forms of hormonal birth control.  Smoking. What are the signs or symptoms? Many ovarian cysts do not cause symptoms. If symptoms are present, they may include:  Pelvic pain or pressure.  Pain in the lower abdomen.  Pain during sex.  Abdominal swelling.  Abnormal menstrual periods.  Increasing pain with menstrual periods. How is this diagnosed? These cysts are commonly found during a routine pelvic exam. You may have tests to find out more about the cyst, such as:  Ultrasound.  CT scan.  MRI.  Blood tests. How is this treated? Many ovarian cysts go away on their own without treatment. Your health care provider may want to check your cyst regularly for 2-3 months to see if it changes. If you are in menopause, it is especially important to have your cyst monitored closely because menopausal women have a higher rate of ovarian cancer. When treatment is needed, it may include:  Medicines to help relieve pain.  A procedure to drain the cyst (aspiration).  Surgery to remove the whole cyst  (cystectomy).  Hormone treatment or birth control pills. These methods are sometimes used to help keep cysts from coming back.  Surgery to remove the ovary (oophorectomy). Follow these instructions at home:  Take over-the-counter and prescription medicines only as told by your health care provider.  Ask your health care provider if any medicine prescribed to you requires you to avoid driving or using machinery.  Get regular pelvic exams and Pap tests as often as told by your health care provider.  Return to your normal activities as told by your health care provider. Ask your health care provider what activities are safe for you.  Do not use any products that contain nicotine or tobacco, such as cigarettes, e-cigarettes, and chewing tobacco. If you need help quitting, ask your health care provider.  Keep all follow-up visits. This is important. Contact a health care provider if:  Your periods are late, irregular, painful, or they stop.  You have pelvic pain that does not go away.  You have pressure on your bladder or trouble emptying your bladder completely.  You have any of the following: ? A feeling of fullness. ? You are gaining weight or losing weight without changing your exercise and eating habits. ? Pain, swelling, or bloating in the abdomen. ? Loss of appetite. ? Pain and pressure in your back and pelvis.  You think you may be pregnant. Get help right away if:  You have abdominal or pelvic pain that is severe or gets worse.  You cannot eat or drink without vomiting.  You suddenly develop a fever   or chills.  Your menstrual period is much heavier than usual. Summary  An ovarian cyst is a fluid-filled sac that forms on an ovary.  Some ovarian cysts may cause symptoms and require treatment.  These cysts are commonly found during a routine pelvic exam.  Many ovarian cysts go away on their own without treatment. This information is not intended to replace advice  given to you by your health care provider. Make sure you discuss any questions you have with your health care provider. Document Revised: 11/13/2019 Document Reviewed: 11/13/2019 Elsevier Patient Education  2021 [employer]. Uterine Fibroids  Uterine fibroids, also called leiomyomas, are noncancerous (benign) tumors that can grow in the uterus. They can cause heavy menstrual bleeding and pain. Fibroids may also grow in the fallopian tubes, cervix, or tissues (ligaments) near the uterus. You may have one or many fibroids. Fibroids vary in size, weight, and where they grow in the uterus. Some can become quite large. Most fibroids do not require medical treatment. What are the causes? The cause of this condition is not known. What increases the risk? You are more likely to develop this condition if you:  Are in your 30s or 40s and have not gone through menopause.  Have a family history of this condition.  Are of African American descent.  Started your menstrual period at age 2 or younger.  Have never given birth.  Are overweight or obese. What are the signs or symptoms? Many women do not have any symptoms. Symptoms of this condition may include:  Heavy menstrual bleeding.  Bleeding between menstrual periods.  Pain and pressure in the pelvic area, between your hip bones.  Pain during sex.  Bladder problems, such as needing to urinate right away or more often than usual.  Inability to have children (infertility).  Failure to carry pregnancy to term (miscarriage). How is this diagnosed? This condition may be diagnosed based on:  Your symptoms and medical history.  A physical exam.  A pelvic exam that includes feeling for any tumors.  Imaging tests, such as ultrasound or MRI. How is this treated? Treatment for this condition may include follow-up visits with your health care provider to monitor your fibroids for any changes. Other treatment may include:  Medicines,  such as: ? Medicines to relieve pain, including aspirin and NSAIDs, such as ibuprofen or naproxen. ? Hormone therapy. Treatment may be given as a pill or an injection, or it may be inserted into the uterus using an intrauterine device (IUD).  Surgery that would do one of the following: ? Remove the fibroids (myomectomy). This may be recommended if fibroids affect your fertility and you want to become pregnant. ? Remove the uterus (hysterectomy). ? Block the blood supply to the fibroids (uterine artery embolization). This can cause them to shrink and die. Follow these instructions at home: Medicines  Take over-the-counter and prescription medicines only as told by your health care provider.  Ask your health care provider if you should take iron pills or eat more iron-rich foods, such as dark green, leafy vegetables. Heavy menstrual bleeding can cause low iron levels. Managing pain If directed, apply heat to your back or abdomen to reduce pain. Use the heat source that your health care provider recommends, such as a moist heat pack or a heating pad. To apply heat:  Place a towel between your skin and the heat source.  Leave the heat on for 20-30 minutes.  Remove the heat if your skin turns bright red.  This is especially important if you are unable to feel pain, heat, or cold. You may have a greater risk of getting burned.   General instructions  Pay close attention to your menstrual cycle. Tell your health care provider about any changes, such as: ? Heavier bleeding that requires you to change your pads or tampons more than usual. ? A change in the number of days that your menstrual period lasts. ? A change in symptoms that come with your menstrual period, such as back pain or cramps in your abdomen.  Keep all follow-up visits. This is important, especially if your fibroids need to be monitored for any changes. Contact a health care provider if you:  Have pelvic pain, back pain, or  cramps in your abdomen that do not get better with medicine or heat.  Develop new bleeding between menstrual periods.  Have increased bleeding during or between menstrual periods.  Feel more tired or weak than usual.  Feel light-headed. Get help right away if you:  Faint.  Have pelvic pain that suddenly gets worse.  Have severe vaginal bleeding that soaks a tampon or pad in 30 minutes or less. Summary  Uterine fibroids are noncancerous (benign) tumors that can develop in the uterus.  The exact cause of this condition is not known.  Most fibroids do not require medical treatment unless they affect your ability to have children (fertility).  Contact a health care provider if you have pelvic pain, back pain, or cramps in your abdomen that do not get better with medicines.  Get help right away if you faint, have pelvic pain that suddenly gets worse, or have severe vaginal bleeding. This information is not intended to replace advice given to you by your health care provider. Make sure you discuss any questions you have with your health care provider. Document Revised: 01/06/2020 Document Reviewed: 01/06/2020 Elsevier Patient Education  [employer].

## 2020-08-18 ENCOUNTER — Encounter: Payer: Self-pay | Admitting: Obstetrics and Gynecology

## 2020-08-30 ENCOUNTER — Encounter: Payer: Self-pay | Admitting: Obstetrics and Gynecology

## 2020-09-03 ENCOUNTER — Telehealth: Payer: Self-pay

## 2020-09-03 NOTE — Telephone Encounter (Signed)
Spoke with patient regarding benefits for scheduled Ultrasound. Patient acknowledges understanding of information presented. Encounter closed.

## 2020-09-21 ENCOUNTER — Other Ambulatory Visit: Payer: Self-pay

## 2020-09-21 ENCOUNTER — Ambulatory Visit (INDEPENDENT_AMBULATORY_CARE_PROVIDER_SITE_OTHER): Payer: BC Managed Care – PPO | Admitting: Obstetrics and Gynecology

## 2020-09-21 ENCOUNTER — Encounter: Payer: Self-pay | Admitting: Obstetrics and Gynecology

## 2020-09-21 ENCOUNTER — Ambulatory Visit (INDEPENDENT_AMBULATORY_CARE_PROVIDER_SITE_OTHER): Payer: BC Managed Care – PPO

## 2020-09-21 VITALS — BP 110/66 | HR 86 | Ht 66.0 in | Wt 282.0 lb

## 2020-09-21 DIAGNOSIS — N83201 Unspecified ovarian cyst, right side: Secondary | ICD-10-CM

## 2020-09-21 DIAGNOSIS — F439 Reaction to severe stress, unspecified: Secondary | ICD-10-CM

## 2020-09-21 NOTE — Patient Instructions (Addendum)
Counselor: Rogene Houston [phone]   Managing Stress, Adult Feeling a certain amount of stress is normal. Stress helps our body and mind get ready to deal with the demands of life. Stress hormones can motivate you to do well at work and meet your responsibilities. However severe or long-lasting (chronic) stress can affect your mental and physical health. Chronic stress puts you at higher risk for anxiety, depression, and other health problems like digestive problems, muscle aches, heart disease, high blood pressure, and stroke. What are the causes? Common causes of stress include:  Demands from work, such as deadlines, feeling overworked, or having long hours.  Pressures at home, such as money issues, disagreements with a spouse, or parenting issues.  Pressures from major life changes, such as divorce, moving, loss of a loved one, or chronic illness. You may be at higher risk for stress-related problems if you do not get enough sleep, are in poor health, do not have emotional support, or have a mental health disorder like anxiety or depression. How to recognize stress Stress can make you:  Have trouble sleeping.  Feel sad, anxious, irritable, or overwhelmed.  Lose your appetite.  Overeat or want to eat unhealthy foods.  Want to use drugs or alcohol. Stress can also cause physical symptoms, such as:  Sore, tense muscles, especially in the shoulders and neck.  Headaches.  Trouble breathing.  A faster heart rate.  Stomach pain, nausea, or vomiting.  Diarrhea or constipation.  Trouble concentrating. Follow these instructions at home: Lifestyle  Identify the source of your stress and your reaction to it. See a therapist who can help you change your reactions.  When there are stressful events: ? Talk about it with family, friends, or co-workers. ? Try to think realistically about stressful events and not ignore them or overreact. ? Try to find the positives in a  stressful situation and not focus on the negatives. ? Cut back on responsibilities at work and home, if possible. Ask for help from friends or family members if you need it.  Find ways to cope with stress, such as: ? Meditation. ? Deep breathing. ? Yoga or tai chi. ? Progressive muscle relaxation. ? Doing art, playing music, or reading. ? Making time for fun activities. ? Spending time with family and friends.  Get support from family, friends, or spiritual resources. Eating and drinking  Eat a healthy diet. This includes: ? Eating foods that are high in fiber, such as beans, whole grains, and fresh fruits and vegetables. ? Limiting foods that are high in fat and processed sugars, such as fried and sweet foods.  Do not skip meals or overeat.  Drink enough fluid to keep your urine pale yellow. Alcohol use  Do not drink alcohol if: ? Your health care provider tells you not to drink. ? You are pregnant, may be pregnant, or are planning to become pregnant.  Drinking alcohol is a way some people try to ease their stress. This can be dangerous, so if you drink alcohol: ? Limit how much you use to:  0-1 drink a day for women.  0-2 drinks a day for men. ? Be aware of how much alcohol is in your drink. In the [place] one drink equals one 12 oz bottle of beer (355 mL), one 5 oz glass of wine (148 mL), or one 1 oz glass of hard liquor (44 mL). Activity  Include 30 minutes of exercise in your daily schedule. Exercise is a good stress reducer.  Include time in your day for an activity that you find relaxing. Try taking a walk, going on a bike ride, reading a book, or listening to music.  Schedule your time in a way that lowers stress, and keep a consistent schedule. Prioritize what is most important to get done.   General instructions  Get enough sleep. Try to go to sleep and get up at about the same time every day.  Take over-the-counter and prescription medicines only as told by  your health care provider.  Do not use any products that contain nicotine or tobacco, such as cigarettes, e-cigarettes, and chewing tobacco. If you need help quitting, ask your health care provider.  Do not use drugs or smoke to cope with stress.  Keep all follow-up visits as told by your health care provider. This is important. Where to find support  Talk with your health care provider about stress management or finding a support group.  Find a therapist to work with you on your stress management techniques. Contact a health care provider if:  Your stress symptoms get worse.  You are unable to manage your stress at home.  You are struggling to stop using drugs or alcohol. Get help right away if:  You may be a danger to yourself or others.  You have any thoughts of death or suicide. If you ever feel like you may hurt yourself or others, or have thoughts about taking your own life, get help right away. You can go to your nearest emergency department or call:  Your local emergency services (911 in the [place]  A suicide crisis helpline, such as the [facility] at (917)828-3738. This is open 24 hours a day. Summary  Feeling a certain amount of stress is normal, but severe or long-lasting (chronic) stress can affect your mental and physical health.  Chronic stress can put you at higher risk for anxiety, depression, and other health problems like digestive problems, muscle aches, heart disease, high blood pressure, and stroke.  You may be at higher risk for stress-related problems if you do not get enough sleep, are in poor health, lack emotional support, or have a mental health disorder like anxiety or depression.  Identify the source of your stress and your reaction to it. Try talking about stressful events with family, friends, or co-workers, finding a coping method, or getting support from spiritual resources.  If you need more help, talk with your  health care provider about finding a support group or a mental health therapist. This information is not intended to replace advice given to you by your health care provider. Make sure you discuss any questions you have with your health care provider. Document Revised: 01/01/2019 Document Reviewed: 01/01/2019 Elsevier Patient Education  [employer].

## 2020-09-21 NOTE — Progress Notes (Signed)
GYNECOLOGY  VISIT   HPI: 25 y.o.   Single Black or African American Not Hispanic or Latino  female   [id number] with No LMP recorded.   here for f/u of a large, complex right ovarian cyst. At the time of her ultrasound on 08/17/20:    Right ovary 7.25 x 6.98 x 6.9 cm 6.71 x 4.76 cm complex right ovarian cyst. 2 solid areas noted: 19 x 15 mm and 16 x 16 mm. No increased vascularity.   H/O irregular menses, taking cyclic provera as needed. LMP 09/02/19.  The original ultrasound was done for pain. That severe pain has resolved. She still has occasional cramping, but overall better.   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 Still living at home. Under lots of stress. Would like to speak to a counselor.  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   GYNECOLOGIC HISTORY: No LMP recorded. Contraception: none  Menopausal hormone therapy: none         OB History    Gravida  0   Para  0   Term  0   Preterm  0   AB  0   Living  0     SAB  0   IAB  0   Ectopic  0   Multiple  0   Live Births  0              Patient Active Problem List   Diagnosis Date Noted  . Genetic testing 07/02/2020  . Family history of breast cancer   . Family history of colon cancer   . Family history of prostate cancer   . Family history of ovarian cancer   . Prediabetes   . Well adult exam 04/12/2020  . Snorings 04/12/2020  . PCOS (polycystic ovarian syndrome) 12/04/2019  . Vitamin D deficiency 03/31/2019  . Hyperglycemia 03/31/2019  . Anemia 01/21/2018  . Midsternal chest pain 11/26/2015  . Pain in joint, ankle and foot 03/30/2015  . Apathy 03/26/2014  . Headache 03/26/2014  . PMS (premenstrual syndrome) 01/26/2014  . Anxiety and depression 01/26/2014  . Fever and chills 09/10/2013  . Fatigue 09/10/2013  . Anxiety attack 06/09/2013  .  Unspecified constipation 04/02/2013  . Diarrhea 04/02/2013  . Allergic rhinitis 02/01/2012  . Otitis media 12/12/2011  . Obesity 03/30/2010  . Hidradenitis 12/02/2009    Past Medical History:  Diagnosis Date  . Anemia   . Anxiety   . Chest pain   . Constipation   . Depression   . Family history of breast cancer   . Family history of colon cancer   . Family history of ovarian cancer   . Family history of prostate cancer   . Hidradenitis suppurativa   . PCOS (polycystic ovarian syndrome)   . Prediabetes   . Vitamin D deficiency     Past Surgical History:  Procedure Laterality Date  . CERVICAL POLYPECTOMY    . DILATATION & CURETTAGE/HYSTEROSCOPY WITH MYOSURE N/A 11/21/2016   Procedure: DILATATION & CURETTAGE/HYSTEROSCOPY WITH MYOSURE;  [name], MD;  Location: [facility];  Service: Gynecology;  [name];    Current Outpatient Medications  Medication Sig Dispense Refill  . chlorhexidine (HIBICLENS) 4 % external liquid Apply topically daily as needed. Wash 2-3 times a week 473 mL 1  . clindamycin (CLEOCIN T) 1 % external  solution Apply 1 application topically as needed (hidradenitis suppurativa flare).     . ferrous sulfate 325 (65 FE) MG tablet Take 1 tablet (325 mg total) by mouth daily. 90 tablet 1  . hydrOXYzine (VISTARIL) 25 MG capsule TAKE 25MG S BY MOUTH UP TO TWO TIMES DAILY AS NEEDED FOR ANXIETY 180 capsule 0  . medroxyPROGESTERone (PROVERA) 5 MG tablet Take one tablet a day x 5 days every 1-2 months if no spontaneous cycles. 15 tablet 3  . sertraline (ZOLOFT) 100 MG tablet Take 2 tablets (200 mg total) by mouth daily. 180 tablet 3  . spironolactone (ALDACTONE) 50 MG tablet Take 50 mg by mouth daily.    [name] VITAMIN D PO Take by mouth.     No current facility-administered medications for this visit.     ALLERGIES: Doxycycline and Sulfamethoxazole-trimethoprim  Family History  Problem Relation Age of Onset  . Hypertension Mother   . Depression  Mother   . Obesity Mother   . Sleep apnea Father   . Obesity Father   . Breast cancer Half-Sister 22  . Colon cancer Maternal Uncle 44  . Diabetes Paternal Aunt   . Prostate cancer Maternal Grandfather        dx 37s, metastatic  . Heart attack Maternal Grandmother   . Stroke Paternal Grandfather   . Breast cancer Other        multiple of mother's paternal cousins  . Ovarian cancer Other        multiple of mother's paternal cousins  . Prostate cancer Other        multiple of mother's paternal cousins  . Ovarian cancer Maternal Great-grandmother        dx ?, MGF's mother    Social History   Socioeconomic History  . Marital status: Single    Spouse name: Not on file  . Number of children: 0  . Years of education: Not on file  . Highest education level: Not on file  Occupational History  . Occupation: Scientist, water quality  Tobacco Use  . Smoking status: Never Smoker  . Smokeless tobacco: Never Used  Vaping Use  . Vaping Use: Never used  Substance and Sexual Activity  . Alcohol use: No    Alcohol/week: 0.0 standard drinks  . Drug use: No  . Sexual activity: Not Currently  Other Topics Concern  . Not on file  Social History Narrative  . Not on file   Social Determinants of Health   Financial Resource Strain: Not on file  Food Insecurity: Not on file  Transportation Needs: Not on file  Physical Activity: Not on file  Stress: Not on file  Social Connections: Not on file  Intimate Partner Violence: Not on file    Review of Systems  All other systems reviewed and are negative.   PHYSICAL EXAMINATION:    There were no vitals taken for this visit.    General appearance: alert, cooperative and appears stated age  Pelvic ultrasound  Indications: f/u right ovarian cyst  Findings:  Anteverted, normal sized uterus with several small myomas, not remeasured  Endometrium 5.27 mm  Left ovary 3.78 x 2.79 x 2.89 cm  Right ovary 6.85 x 6.74 x 4.65 cm  6.73 x 4.04 cm ovarian  cyst, complex. The complexity is at the edge of the cyst. No vascularity in the solid appearing areas.  No free fluid   Impression:  Normal uterus, fibroids from last month not measured again Persistent complex right ovarian cyst, images compared with  last months images and the complexity seems to be improving. On prior imaging there was a septum and solid appearing areas in the middle of the cyst. Now no septum is seem and the solid appearing areas are less obvious and on the edge of the cyst. No vascularity. Suspect slow resolution of a hemorrhagic CL  1. Right ovarian cyst Still 6.7 cm, but the complexity of the cyst appears to be improving. Will repeat an ultrasound in 2 months -discussed risk of torsion or rupture - US PELVIS TRANSVAGINAL NON-OB (TV ONLY); Future  2. Stress Names of counselors given.

## 2020-09-27 ENCOUNTER — Other Ambulatory Visit: Payer: Self-pay

## 2020-09-28 ENCOUNTER — Encounter: Payer: Self-pay | Admitting: Internal Medicine

## 2020-09-28 ENCOUNTER — Ambulatory Visit: Payer: BC Managed Care – PPO | Admitting: Internal Medicine

## 2020-09-28 DIAGNOSIS — J301 Allergic rhinitis due to pollen: Secondary | ICD-10-CM

## 2020-09-28 DIAGNOSIS — R059 Cough, unspecified: Secondary | ICD-10-CM

## 2020-09-28 MED ORDER — LORATADINE 10 MG PO TABS: 10.0000 mg | ORAL_TABLET | Freq: Every day | ORAL | 11 refills | Status: DC

## 2020-09-28 NOTE — Progress Notes (Signed)
   Subjective:  Patient ID: Kendra Allen, female    DOB: [birth year]  Age: 25 y.o. MRN: [id number]  CC: Dry Cough (X's 2 weeks)   HPI Kendra Allen presents for a dry cough x wks, worse at night   Outpatient Medications Prior to Visit  Medication Sig Dispense Refill  . clindamycin (CLEOCIN T) 1 % external solution Apply 1 application topically as needed (hidradenitis suppurativa flare).     . ferrous sulfate 325 (65 FE) MG tablet Take 1 tablet (325 mg total) by mouth daily. 90 tablet 1  . hydrOXYzine (VISTARIL) 25 MG capsule TAKE 25MG S BY MOUTH UP TO TWO TIMES DAILY AS NEEDED FOR ANXIETY 180 capsule 0  . medroxyPROGESTERone (PROVERA) 5 MG tablet Take one tablet a day x 5 days every 1-2 months if no spontaneous cycles. 15 tablet 3  . sertraline (ZOLOFT) 100 MG tablet Take 2 tablets (200 mg total) by mouth daily. 180 tablet 3  . spironolactone (ALDACTONE) 50 MG tablet Take 50 mg by mouth daily.    [name] VITAMIN D PO Take by mouth.    . chlorhexidine (HIBICLENS) 4 % external liquid Apply topically daily as needed. Wash 2-3 times a week 473 mL 1   No facility-administered medications prior to visit.    ROS: Review of Systems  Objective:  BP 128/80 (BP Location: Left Arm)   Pulse 76   Temp 98.1 F (36.7 C) (Oral)   Ht 5\' 6"  (1.676 m)   Wt 276 lb 6.4 oz (125.4 kg)   LMP 09/01/2020   SpO2 97%   BMI 44.61 kg/m   BP Readings from Last 3 Encounters:  09/28/20 128/80  09/21/20 110/66  07/21/20 126/80    Wt Readings from Last 3 Encounters:  09/28/20 276 lb 6.4 oz (125.4 kg)  09/21/20 282 lb (127.9 kg)  08/17/20 282 lb (127.9 kg)    Physical Exam  Lab Results  Component Value Date   WBC 8.5 04/09/2020   HGB 11.4 (L) 04/09/2020   HCT 35.1 (L) 04/09/2020   PLT 304.0 04/09/2020   GLUCOSE 80 04/09/2020   CHOL 140 04/09/2020   TRIG 97.0 04/09/2020   HDL 39.30 04/09/2020   LDLCALC 81 04/09/2020   ALT 14 04/09/2020   AST 13 04/09/2020   NA 137 04/09/2020   K 3.7  04/09/2020   CL 102 04/09/2020   CREATININE 0.54 04/09/2020   BUN 11 04/09/2020   CO2 27 04/09/2020   TSH 2.69 04/09/2020   HGBA1C 5.9 04/09/2020    DG Abd 2 Views  Result Date: 08/14/2019 CLINICAL DATA:  Intermittent diarrhea for 6 months EXAM: ABDOMEN - 2 VIEW COMPARISON:  None. FINDINGS: The bowel gas pattern is normal. There is no evidence of free air. Mild volume of stool projects throughout the colon. No radio-opaque calculi or other significant radiographic abnormality is seen. Unremarkable SI joints. IMPRESSION: Negative. Electronically Signed   By: Davina Poke D.O.   On: 08/14/2019 14:54    Assessment & Plan:   There are no diagnoses linked to this encounter.   No orders of the defined types were placed in this encounter.    Follow-up: No follow-ups on file.  Walker Kehr, MD

## 2020-09-28 NOTE — Assessment & Plan Note (Signed)
  Try Dayquil and Nyquil for cough Take Loratidine daily

## 2020-09-28 NOTE — Patient Instructions (Addendum)
Try Dayquil and Nyquil for cough as needed Take Loratidine daily

## 2020-09-28 NOTE — Assessment & Plan Note (Addendum)
Treat allergies No h/o asthma Try Dayquil and Nyquil for cough Take Loratidine daily

## 2020-10-24 ENCOUNTER — Other Ambulatory Visit: Payer: Self-pay | Admitting: Internal Medicine

## 2020-10-29 ENCOUNTER — Other Ambulatory Visit: Payer: Self-pay | Admitting: Internal Medicine

## 2020-11-15 NOTE — Telephone Encounter (Signed)
AO please advise. Thanks   For the whole month of July I have out of town training which will require me to have a roommate. I still have not received any contact for scheduling a consultation for an oral device. If possible, may you write me a note so I do not have to share a room and risk interrupting the sleep of someone else do to my sleep apnea?   Thank you for your time.

## 2020-11-16 NOTE — Telephone Encounter (Signed)
Can we look into what is taking so long to set patient up for an oral device and update the patient    Will not be able to provide a letter stating that the patient snores with mild obstructive sleep apnea study does not assess how severely patient snores

## 2020-11-16 NOTE — Telephone Encounter (Addendum)
Checked to make sure that an order was put in for pt to have the referral to receive the oral device for OSA and referral was placed 07/23/20.  PCCs, is there any way you can help [place] look into this to see why pt still has not received any response about the referral?

## 2020-11-16 NOTE — Telephone Encounter (Signed)
The order was placed under Pulmonary Critical Care and we were not aware the order was placed

## 2020-11-22 NOTE — Telephone Encounter (Signed)
Patient is requesting work note. See message below.  Hello,  For the whole month of July I have out of town training which will require me to have a roommate. I still have not received any contact for scheduling a consultation for an oral device. If possible, may you write me a note so I do not have to share a room and risk interrupting the sleep of someone else do to my sleep apnea?   Thank you for your time.  Kendra Allen  Message routed to Dr.Olalere to advise

## 2020-11-24 ENCOUNTER — Telehealth: Payer: Self-pay | Admitting: Pulmonary Disease

## 2020-11-24 NOTE — Telephone Encounter (Signed)
Called and spoke with pt making her aware that letter has already been placed in the mail for her and she verbalized understanding. Nothing further needed.

## 2020-11-24 NOTE — Telephone Encounter (Signed)
Pt requesting that the date of her first appointment 05/12/2020 can be added to the doctor's note. Would like the note to be updated to say "this patient has been followed by office since 05/12/2020" if possible. Pt can be reached at [phone].

## 2020-11-24 NOTE — Telephone Encounter (Signed)
Called and spoke with patient. She was calling to ask about the referral to Dr. Corky Sing office. She stated that she has not heard anything from their office. When I looked at her chart, it looks like the order was placed back in February but the actual order was not sent until 11/16/20.   Called Dr. Corky Sing office, no one answered but I left a detailed message for them to call us back.   In regards to the letter, I have printed the letter. She requested to have the letter mailed to her. Address has been verified. Letter has been placed in the mail.

## 2020-11-25 ENCOUNTER — Ambulatory Visit: Payer: BC Managed Care – PPO | Admitting: Obstetrics and Gynecology

## 2020-11-25 ENCOUNTER — Telehealth: Payer: Self-pay | Admitting: Pulmonary Disease

## 2020-11-25 ENCOUNTER — Encounter: Payer: Self-pay | Admitting: Obstetrics and Gynecology

## 2020-11-25 ENCOUNTER — Ambulatory Visit (INDEPENDENT_AMBULATORY_CARE_PROVIDER_SITE_OTHER): Payer: BC Managed Care – PPO

## 2020-11-25 ENCOUNTER — Other Ambulatory Visit: Payer: Self-pay

## 2020-11-25 VITALS — BP 128/66 | HR 74 | Ht 66.0 in | Wt 276.8 lb

## 2020-11-25 DIAGNOSIS — R7303 Prediabetes: Secondary | ICD-10-CM

## 2020-11-25 DIAGNOSIS — Z6841 Body Mass Index (BMI) 40.0 and over, adult: Secondary | ICD-10-CM

## 2020-11-25 DIAGNOSIS — N83201 Unspecified ovarian cyst, right side: Secondary | ICD-10-CM

## 2020-11-25 DIAGNOSIS — D259 Leiomyoma of uterus, unspecified: Secondary | ICD-10-CM

## 2020-11-25 NOTE — Progress Notes (Signed)
GYNECOLOGY  VISIT   HPI: 25 y.o.   Single Black or African American Not Hispanic or Latino  female   [id number] with No LMP recorded.   here for follow up of a right ovarian cyst.    The patient was noted to have a 6.7 cm complex right ovarian cyst in 3/22. The ultrasound was done secondary to pain. A repeat ultrasound a month later showed that the cyst was still 6.7 cm, but the complexity within the cyst seemed to be improving. Her pain has improved.   GYNECOLOGIC HISTORY: No LMP recorded. Contraception:none  Menopausal hormone therapy: none         OB History     Gravida  0   Para  0   Term  0   Preterm  0   AB  0   Living  0      SAB  0   IAB  0   Ectopic  0   Multiple  0   Live Births  0              Patient Active Problem List   Diagnosis Date Noted   Cough 09/28/2020   Genetic testing 07/02/2020   Family history of breast cancer    Family history of colon cancer    Family history of prostate cancer    Family history of ovarian cancer    Prediabetes    Well adult exam 04/12/2020   Snorings 04/12/2020   PCOS (polycystic ovarian syndrome) 12/04/2019   Vitamin D deficiency 03/31/2019   Hyperglycemia 03/31/2019   Anemia 01/21/2018   Midsternal chest pain 11/26/2015   Pain in joint, ankle and foot 03/30/2015   Apathy 03/26/2014   Headache 03/26/2014   PMS (premenstrual syndrome) 01/26/2014   Anxiety and depression 01/26/2014   Fever and chills 09/10/2013   Fatigue 09/10/2013   Anxiety attack 06/09/2013   Unspecified constipation 04/02/2013   Diarrhea 04/02/2013   Allergic rhinitis 02/01/2012   Otitis media 12/12/2011   Obesity 03/30/2010   Hidradenitis 12/02/2009    Past Medical History:  Diagnosis Date   Anemia    Anxiety    Chest pain    Constipation    Depression    Family history of breast cancer    Family history of colon cancer    Family history of ovarian cancer    Family history of prostate cancer    Hidradenitis suppurativa     PCOS (polycystic ovarian syndrome)    Prediabetes    Vitamin D deficiency     Past Surgical History:  Procedure Laterality Date   CERVICAL POLYPECTOMY     DILATATION & CURETTAGE/HYSTEROSCOPY WITH MYOSURE N/A 11/21/2016   Procedure: DILATATION & CURETTAGE/HYSTEROSCOPY WITH MYOSURE;  [name], MD;  Location: [facility];  Service: Gynecology;  [name];    Current Outpatient Medications  Medication Sig Dispense Refill   clindamycin (CLEOCIN T) 1 % external solution Apply 1 application topically as needed (hidradenitis suppurativa flare).      ferrous sulfate 325 (65 FE) MG tablet Take 1 tablet (325 mg total) by mouth daily. 90 tablet 1   hydrOXYzine (VISTARIL) 25 MG capsule TAKE 25MG S BY MOUTH UP TO TWO TIMES DAILY AS NEEDED FOR ANXIETY 180 capsule 0   loratadine (CLARITIN) 10 MG tablet Take 1 tablet (10 mg total) by mouth daily. 30 tablet 11   medroxyPROGESTERone (PROVERA) 5 MG tablet Take one tablet a day x 5 days every 1-2 months if no spontaneous  cycles. 15 tablet 3   sertraline (ZOLOFT) 100 MG tablet Take 2 tablets (200 mg total) by mouth daily. 180 tablet 3   spironolactone (ALDACTONE) 50 MG tablet Take 50 mg by mouth daily.     VITAMIN D PO Take by mouth.     No current facility-administered medications for this visit.     ALLERGIES: Doxycycline and Sulfamethoxazole-trimethoprim  Family History  Problem Relation Age of Onset   Hypertension Mother    Depression Mother    Obesity Mother    Sleep apnea Father    Obesity Father    Breast cancer Half-Sister 15   Colon cancer Maternal Uncle 55   Diabetes Paternal Aunt    Prostate cancer Maternal Grandfather        dx 15s, metastatic   Heart attack Maternal Grandmother    Stroke Paternal Grandfather    Breast cancer Other        multiple of mother's paternal cousins   Ovarian cancer Other        multiple of mother's paternal cousins   Prostate cancer Other        multiple of mother's paternal  cousins   Ovarian cancer Maternal Great-grandmother        dx ?, MGF's mother    Social History   Socioeconomic History   Marital status: Single    Spouse name: Not on file   Number of children: 0   Years of education: Not on file   Highest education level: Not on file  Occupational History   Occupation: Scientist, water quality  Tobacco Use   Smoking status: Never   Smokeless tobacco: Never  Vaping Use   Vaping Use: Never used  Substance and Sexual Activity   Alcohol use: No    Alcohol/week: 0.0 standard drinks   Drug use: No   Sexual activity: Not Currently  Other Topics Concern   Not on file  Social History Narrative   Not on file   Social Determinants of Health   Financial Resource Strain: Not on file  Food Insecurity: Not on file  Transportation Needs: Not on file  Physical Activity: Not on file  Stress: Not on file  Social Connections: Not on file  Intimate Partner Violence: Not on file    Review of Systems  All other systems reviewed and are negative.  PHYSICAL EXAMINATION:    BP 128/66   Pulse 74   Ht 5\' 6"  (1.676 m)   Wt 276 lb 12.8 oz (125.6 kg)   SpO2 98%   BMI 44.68 kg/m     General appearance: alert, cooperative and appears stated age  Pelvic ultrasound  Indications: right ovarian cyst  Findings:  Uterus 8.87 x 6.66 x 4.57 cm  Fibroids:   1) 2.3 x 2.03 cm  2) 1.13 x 1.13 cm  3) 1.86 x 1.24 cm  4) 3.03 x 2.81 cm  Endometrium 4.02 cm  Left ovary 3.93 x 2.46 x 1.67 cm  Right ovary 6.68 x 5.98 x 5.7 cm 6.23 x 4.47 cm thick walled cyst with a 2.01 x 1.79 nodule on the interior cyst wall with vascular flow.   No free fluid   Impression:  Persistent 6 cm right ovarian cyst, now with nodularity and vascular flow. Normal sized uterus with several small fibroids Thin endometrium Normal left ovary  1. Right ovarian cyst Persistent complex right ovarian cyst, now with nodule with vascular flow.  Recommend laparoscopy with right oophorectomy. Risks  of surgery reviewed, including but not  limited to: bleeding, infection, damage to bowel/bladder/vessels/ureters CA 125 drawn  2. Uterine leiomyoma, unspecified location  3. Prediabetes  4. BMI 40.0-44.9, adult ([place])  In addition to reviewing the ultrasound images. Lab work was drawn and management was reviewed. Depending on CA 125 either surgery will be scheduled or referral to GYN Oncology will be made.

## 2020-11-25 NOTE — Patient Instructions (Signed)
Diagnostic Laparoscopy Diagnostic laparoscopy is a procedure to diagnose problems in the abdomen. It might be done for a variety of reasons, such as to look for scar tissue, a reason for abdominal pain, an abdominal mass or tumor, or fluid in the abdomen (ascites). This procedure may also be done to remove a tissue sample from the liver to look at under a microscope (biopsy). During the procedure, a thin, flexible tube that has a light and a camera on the end (laparoscope) is inserted through a small incision in the abdomen. The image from the camera is shown on a monitor to help the surgeon see inside the body. Tell a health care provider about: Any allergies you have. All medicines you are taking, including vitamins, herbs, eye drops, creams, and over-the-counter medicines. Any problems you or family members have had with anesthetic medicines. Any blood disorders you have. Any surgeries you have had. Any medical conditions you have. Whether you are pregnant or may be pregnant. What are the risks? Generally, this is a safe procedure. However, problems may occur, including: Infection. Bleeding. Allergic reactions to medicines or dyes. Damage to abdominal structures or organs, such as the intestines, liver, stomach, or spleen. What happens before the procedure? Staying hydrated Follow instructions from your health care provider about hydration, which may include: Up to 2 hours before the procedure - you may continue to drink clear liquids, such as water, clear fruit juice, black coffee, and plain tea.   Eating and drinking restrictions Follow instructions from your health care provider about eating and drinking, which may include: 8 hours before the procedure - stop eating heavy meals or foods, such as meat, fried foods, or fatty foods. 6 hours before the procedure - stop eating light meals or foods, such as toast or cereal. 6 hours before the procedure - stop drinking milk or drinks that  contain milk. 2 hours before the procedure - stop drinking clear liquids. Medicines Ask your health care provider about: Changing or stopping your regular medicines. This is especially important if you are taking diabetes medicines or blood thinners. Taking medicines such as aspirin and ibuprofen. These medicines can thin your blood. Do not take these medicines unless your health care provider tells you to take them. Taking over-the-counter medicines, vitamins, herbs, and supplements. General instructions Ask your health care provider: How your surgery site will be marked. What steps will be taken to help prevent infection. These steps may include: Removing hair at the surgery site. Washing skin with a germ-killing soap. Taking antibiotic medicine. Plan to have a responsible adult take you home from the hospital or clinic. Plan to have a responsible adult care for you for the time you are told after you leave the hospital or clinic. This is important. What happens during the procedure? An IV will be inserted into one of your veins. You will be given one or more of the following: A medicine to help you relax (sedative). A medicine to numb the area (local anesthetic). A medicine to make you fall asleep (general anesthetic). A breathing tube will be placed down your throat to help you breathe during the procedure. Your abdomen will be filled with an air-like gas so that your abdomen expands. This will give the surgeon more room to operate and will make your organs easier to see. Many small incisions will be made in your abdomen. A laparoscope and other surgical instruments will be inserted into your abdomen through these incisions. A biopsy may be done.  This will depend on the reason why you are having this procedure. The laparoscope and other instruments will be removed from your abdomen. The air-like gas will be released from your abdomen. Your incisions will be closed with stitches  (sutures), skin glue, or surgical tapes and covered with a bandage (dressing). Your breathing tube will be removed. The procedure may vary among health care providers and hospitals.   What happens after the procedure? Your blood pressure, heart rate, breathing rate, and blood oxygen level will be monitored until you leave the hospital or clinic. If you were given a sedative during the procedure, it can affect you for several hours. Do not drive or operate machinery until your health care provider says that it is safe. It is up to you to get the results of your procedure. Ask your health care provider, or the department that is doing the procedure, when your results will be ready. Summary Diagnostic laparoscopy is a procedure to diagnose problems in the abdomen using a thin, flexible tube that has a light and a camera on the end (laparoscope). Follow instructions from your health care provider about how to prepare for the procedure. Plan to have a responsible adult care for you for the time you are told after you leave the hospital or clinic. This is important. This information is not intended to replace advice given to you by your health care provider. Make sure you discuss any questions you have with your health care provider. Document Revised: 01/30/2020 Document Reviewed: 01/30/2020 Elsevier Patient Education  2021 [employer].

## 2020-11-26 ENCOUNTER — Telehealth: Payer: Self-pay | Admitting: *Deleted

## 2020-11-26 LAB — CA 125: CA 125: 61 U/mL — ABNORMAL HIGH (ref <35)

## 2020-11-26 NOTE — Telephone Encounter (Signed)
Records faxed and confirmation recieved

## 2020-11-26 NOTE — Telephone Encounter (Signed)
Sent message to [place] at Gyn-oncology to call and schedule and let me know time and date.

## 2020-11-26 NOTE — Telephone Encounter (Signed)
-----   Message from Salvadore Dom, MD sent at 11/26/2020  4:02 PM EDT ----- Called patient, reviewed results. Recommended consultation with GYN Oncologist. I spoke with the patient's mother at [place] request. Explained her ovarian cyst and blood work.  Please place referral to Dr Berline Lopes. They are worried and want her seen as early as possible.

## 2020-11-29 ENCOUNTER — Telehealth: Payer: Self-pay | Admitting: *Deleted

## 2020-11-29 NOTE — Telephone Encounter (Signed)
Returned the patient's cal and scheduled her for a new patient appt. Referring doctor ask for Dr Berline Lopes; and the patient is out of town this week. Patient given the address and phone number for the clinic. Patient also given the policy for mask and visitors.

## 2020-11-29 NOTE — Telephone Encounter (Signed)
Patient scheduled on 12/10/20 with Dr.Tucker.

## 2020-11-29 NOTE — Telephone Encounter (Signed)
Attempted to reach the patient to schedule a new patient appt. No answer. Left a message to all the office back.

## 2020-12-08 ENCOUNTER — Telehealth: Payer: Self-pay | Admitting: Internal Medicine

## 2020-12-08 ENCOUNTER — Encounter: Payer: Self-pay | Admitting: Gynecologic Oncology

## 2020-12-08 NOTE — Telephone Encounter (Signed)
Patient called and said that she has a flare up of hidradenitis and she said that she called her dermatologist and they cannot see her until August. She was wondering if Dr. Alain Marion would be able to give her a steroid injection. She can be reached at [phone]. Please advise

## 2020-12-09 MED ORDER — CEPHALEXIN 500 MG PO CAPS: 500.0000 mg | ORAL_CAPSULE | Freq: Four times a day (QID) | ORAL | 0 refills | Status: DC

## 2020-12-09 NOTE — Progress Notes (Unsigned)
GYNECOLOGIC ONCOLOGY NEW PATIENT CONSULTATION   Patient Name: Kendra Allen  Patient Age: 25 y.o. Date of Service: 12/10/20 Referring Provider: Sumner Boast MD  Primary Care Provider: Cassandria Anger, MD Consulting Provider: Jeral Pinch, MD   Assessment/Plan:    A copy of this note was sent to the patient's referring provider.   Jeral Pinch, MD  Division of Gynecologic Oncology  Department of Obstetrics and Gynecology  [facility]  ___________________________________________  Chief Complaint: Chief Complaint  Patient presents with   Cyst of right ovary    History of Present Illness:  Kendra Allen is a 25 y.o. y.o. female who is seen in consultation at the request of Dr. Talbert Nan for an evaluation of ***   The patient was noted to have a 6.7 cm complex right ovarian cyst in 3/22. The ultrasound was done secondary to pain. A repeat ultrasound a month later showed that the cyst was still 6.7 cm, but the complexity within the cyst seemed to be improving. Her pain has improved.     Findings:   Uterus 8.87 x 6.66 x 4.57 cm             Fibroids:             1) 2.3 x 2.03 cm             2) 1.13 x 1.13 cm             3) 1.86 x 1.24 cm             4) 3.03 x 2.81 cm   Endometrium 4.02 cm   Left ovary 3.93 x 2.46 x 1.67 cm   Right ovary 6.68 x 5.98 x 5.7 cm 6.23 x 4.47 cm thick walled cyst with a 2.01 x 1.79 nodule on the interior cyst wall with vascular flow.   No free fluid     Impression:  Persistent 6 cm right ovarian cyst, now with nodularity and vascular flow. Normal sized uterus with several small fibroids Thin endometrium Normal left ovary  PAST MEDICAL HISTORY:  Past Medical History:  Diagnosis Date   Anemia    Anxiety    Chest pain    Constipation    Depression    Family history of breast cancer    Family history of colon cancer    Family history of ovarian cancer    Family history of prostate cancer     Hidradenitis suppurativa    PCOS (polycystic ovarian syndrome)    Prediabetes    Vitamin D deficiency      PAST SURGICAL HISTORY:  Past Surgical History:  Procedure Laterality Date   CERVICAL POLYPECTOMY     DILATATION & CURETTAGE/HYSTEROSCOPY WITH MYOSURE N/A 11/21/2016   Procedure: [place];  [name], MD;  Location: [facility];  Service: Gynecology;  [name];    OB/GYN HISTORY:  OB History  Gravida Para Term Preterm AB Living  0 0 0 0 0 0  SAB IAB Ectopic Multiple Live Births  0 0 0 0 0    No LMP recorded.  Age at menarche: ***  Age at menopause: *** Hx of HRT: *** Hx of STDs: *** Last pap: *** History of abnormal pap smears: ***  SCREENING STUDIES:  Last mammogram: 01/2020  Last colonoscopy: *** Last bone mineral density: ***  MEDICATIONS: Outpatient Encounter Medications as of 12/10/2020  Medication Sig   clindamycin (CLEOCIN T) 1 % external  solution Apply 1 application topically as needed (hidradenitis suppurativa flare).    sertraline (ZOLOFT) 100 MG tablet Take 2 tablets (200 mg total) by mouth daily.   spironolactone (ALDACTONE) 50 MG tablet Take 50 mg by mouth daily.   [DISCONTINUED] CEPHALEXIN PO Take 500 mg by mouth as needed.   ferrous sulfate 325 (65 FE) MG tablet Take 1 tablet (325 mg total) by mouth daily.   hydrOXYzine (VISTARIL) 25 MG capsule TAKE 25MG S BY MOUTH UP TO TWO TIMES DAILY AS NEEDED FOR ANXIETY (Patient not taking: Reported on 12/08/2020)   loratadine (CLARITIN) 10 MG tablet Take 1 tablet (10 mg total) by mouth daily. (Patient not taking: Reported on 12/08/2020)   medroxyPROGESTERone (PROVERA) 5 MG tablet Take one tablet a day x 5 days every 1-2 months if no spontaneous cycles. (Patient not taking: Reported on 12/08/2020)   VITAMIN D PO Take by mouth. (Patient not taking: Reported on 12/08/2020)   No facility-administered encounter medications on file as of 12/10/2020.     ALLERGIES:  Allergies  Allergen Reactions   Doxycycline     Upset stomach   Sulfamethoxazole-Trimethoprim     upset stomach     FAMILY HISTORY:  Family History  Problem Relation Age of Onset   Hypertension Mother    Depression Mother    Obesity Mother    Sleep apnea Father    Obesity Father    Colon cancer Maternal Uncle 59   Diabetes Paternal Aunt    Heart attack Maternal Grandmother    Prostate cancer Maternal Grandfather        dx 76s, metastatic   Stroke Paternal Grandfather    Breast cancer Half-Sister 29   Ovarian cancer Maternal Great-grandmother        dx ?, MGF's mother   Breast cancer Other        multiple of mother's paternal cousins   Ovarian cancer Other        multiple of mother's paternal cousins   Prostate cancer Other        multiple of mother's paternal cousins   Endometrial cancer Neg Hx    Pancreatic cancer Neg Hx      SOCIAL HISTORY:  Social Connections: Not on file    REVIEW OF SYSTEMS:  Denies appetite changes, fevers, chills, fatigue, unexplained weight changes. Denies hearing loss, neck lumps or masses, mouth sores, ringing in ears or voice changes. Denies cough or wheezing.  Denies shortness of breath. Denies chest pain or palpitations. Denies leg swelling. Denies abdominal distention, pain, blood in stools, constipation, diarrhea, nausea, vomiting, or early satiety. Denies pain with intercourse, dysuria, frequency, hematuria or incontinence. Denies hot flashes, pelvic pain, vaginal bleeding or vaginal discharge.   Denies joint pain, back pain or muscle pain/cramps. Denies itching, rash, or wounds. Denies dizziness, headaches, numbness or seizures. Denies swollen lymph nodes or glands, denies easy bruising or bleeding. Denies anxiety, depression, confusion, or decreased concentration.  Physical Exam:  Vital Signs for this encounter:  There were no vitals taken for this visit. There is no height or weight on file to calculate  BMI. General: Alert, oriented, no acute distress.  HEENT: Normocephalic, atraumatic. Sclera anicteric.  Chest: Clear to auscultation bilaterally. No wheezes, rhonchi, or rales. Cardiovascular: Regular rate and rhythm, no murmurs, rubs, or gallops.  Abdomen: ***Obese. Normoactive bowel sounds. Soft, nondistended, nontender to palpation. No masses or hepatosplenomegaly appreciated. No palpable fluid wave.  Extremities: Grossly normal range of motion. Warm, well perfused. No edema bilaterally.  Skin:  No rashes or lesions.  Lymphatics: No cervical, supraclavicular, or inguinal adenopathy.  GU:  Normal external female genitalia. ***  No lesions. No discharge or bleeding.             Bladder/urethra:  No lesions or masses, well supported bladder             Vagina: ***             Cervix: Normal appearing, no lesions.             Uterus: *** Small, mobile, no parametrial involvement or nodularity.             Adnexa: *** masses.  Rectal: ***  LABORATORY AND RADIOLOGIC DATA:  Outside medical records were reviewed to synthesize the above history, along with the history and physical obtained during the visit.   Lab Results  Component Value Date   WBC 8.5 04/09/2020   HGB 11.4 (L) 04/09/2020   HCT 35.1 (L) 04/09/2020   PLT 304.0 04/09/2020   GLUCOSE 80 04/09/2020   CHOL 140 04/09/2020   TRIG 97.0 04/09/2020   HDL 39.30 04/09/2020   LDLCALC 81 04/09/2020   ALT 14 04/09/2020   AST 13 04/09/2020   NA 137 04/09/2020   K 3.7 04/09/2020   CL 102 04/09/2020   CREATININE 0.54 04/09/2020   BUN 11 04/09/2020   CO2 27 04/09/2020   TSH 2.69 04/09/2020   HGBA1C 5.9 04/09/2020   CA-125 on 6/9: 61  Pelvic ultrasound on 3/16: Findings: Uterus 8.82 x 6.3 x 4.53 cm             Fibroids:             1) 2.25 x 2.09 cm, intramural             2) 1.62 x 1.33 cm, intramural             3) 1.54 x 1.51 cm, intramural             4) 2.07 x 1.76 cm, intramural             5) 2.73 x 2.71 cm,  pedunculated (right lateral uterus) Endometrium 5.76 mm Left ovary 3.88 x 2.95 x 2.31 cm Right ovary 7.25 x 6.98 x 6.9 cm             6.71 x 4.76 cm complex right ovarian cyst. 2 solid areas noted: 19 x 15 mm and 16 x 16 mm. No increased vascularity. no free fluid Impression:  Anteverted uterus with multiple myomas, non impinging on the uterine cavity Complex right ovarian cyst, suspect hemorrhagic CL  Pelvic ultrasound on 4/5: Findings: Anteverted, normal sized uterus with several small myomas, not remeasured Endometrium 5.27 mm Left ovary 3.78 x 2.79 x 2.89 cm Right ovary 6.85 x 6.74 x 4.65 cm             6.73 x 4.04 cm ovarian cyst, complex. The complexity is at the edge of the cyst. No vascularity in the solid appearing areas. No free fluid Impression:  Normal uterus, fibroids from last month not measured again Persistent complex right ovarian cyst, images compared with last months images and the complexity seems to be improving. On prior imaging there was a septum and solid appearing areas in the middle of the cyst. Now no septum is seem and the solid appearing areas are less obvious and on the edge of the cyst. No vascularity. Suspect slow resolution of a hemorrhagic  CL  Pelvic ultrasound on 11/25/20: No report in EPIC

## 2020-12-09 NOTE — Telephone Encounter (Signed)
Called pt to verify which she wanted. She states she not sure what type of steroid injection her dermatologist gives her. It just helps with the inflammation so the sxs would go down faster. Do u want me to ad her on this afternoon, or will you rx something to help w/ inflammation.Marland KitchenJohny Chess

## 2020-12-09 NOTE — Telephone Encounter (Signed)
Notified pt w/MD response.She will callback next week if no better...Kendra Allen

## 2020-12-09 NOTE — Telephone Encounter (Signed)
I'll email Kaliana an abx, I can see her next wk to inject when the inflammation is better and inject Thx

## 2020-12-09 NOTE — Telephone Encounter (Signed)
Okay.  Does she need an antibiotic?  Thanks

## 2020-12-10 ENCOUNTER — Ambulatory Visit: Payer: BC Managed Care – PPO | Admitting: Gynecologic Oncology

## 2020-12-10 ENCOUNTER — Telehealth: Payer: Self-pay | Admitting: *Deleted

## 2020-12-10 ENCOUNTER — Encounter: Payer: Self-pay | Admitting: Gynecologic Oncology

## 2020-12-10 NOTE — Telephone Encounter (Signed)
Spoke with the patient and rescheduled her appt from today to Tuesday at 9 am.

## 2020-12-12 NOTE — Progress Notes (Signed)
GYNECOLOGIC ONCOLOGY NEW PATIENT CONSULTATION   Patient Name: Kendra Allen  Patient Age: 25 y.o. Date of Service: 12/14/20 Referring Provider: Sumner Boast MD  Primary Care Provider: Cassandria Anger, MD Consulting Provider: Jeral Pinch, MD   Assessment/Plan:  25 year old with a complex adnexal mass.  Discussed in detail with the patient and her parents findings on 3 ultrasounds from this year.  Given her description of pain in February, I suspect that she had an episode of ovarian torsion.  We discussed that this is most common in cystic masses on the ovary that are between 5 and 10 cm in size.  We discussed the most recent ultrasound which showed some vascularity to a nodular appearing area within the adnexal cyst.  While the patient CA-125 was mildly elevated, we discussed that in a premenopausal patient, this is not a concerning value.  Additionally, CA-125 is not a diagnostic tool.  It can be elevated in many other disease processes such as endometriosis or uterine fibroids, and can be normal and up to 30-50% of early stage ovarian cancer.  While I agree that the most recent ultrasound has some concerning features, given her young age, polycystic ovarian syndrome, anovulatory bleeding, and possible history that would point towards endometriosis, I think further work-up to determine the characteristics of the cyst are warranted before making a plan for surgery.  I suggested that we get a pelvic MRI to better characterize this adnexal mass.  If the MRI has concerning features, then we discussed plan for robotic ovarian cystectomy versus oophorectomy given findings at the time of surgery.  The goal would be for cystectomy with salvage of at least part of the ovary given the patient's young age.  If there is high concern for a malignant process though, I would make a decision Intra-Op about the safety of cystectomy versus oophorectomy to reduce the risk of fluid spillage.  We discussed  that intraoperative rupture of a malignancy may make the difference between the need for adjuvant therapy after surgery.  I will plan to have a phone call with the patient after her MRI.  Based on the findings, we have tentatively set a surgery date for mid July.  We will discuss the need for surgery at this phone call.  If we move forward with scheduling surgery, we discussed that this would entail robotic ovarian cystectomy versus oophorectomy, possible fertility sparing staging, possible laparotomy.  We discussed perioperative instructions as well as postoperative recovery and expectations.  All the patient's questions as well as both of her parents were answered today.  A copy of this note was sent to the patient's referring provider.   80 minutes of total time was spent for this patient encounter, including preparation, face-to-face counseling with the patient and coordination of care, and documentation of the encounter.  Jeral Pinch, MD  Division of Gynecologic Oncology  Department of Obstetrics and Gynecology  [facility]  ___________________________________________  Chief Complaint: Chief Complaint  Patient presents with   Cyst of right ovary    History of Present Illness:  Kendra Allen is a 25 y.o. y.o. female who is seen in consultation at the request of Dr. Talbert Nan for an evaluation of a complex adnexal mass.  Patient describes an episode of severe pain in February that lasted approximately 6 hours.  This was located in her right pelvis with some radiation down her right leg.  She had associated emesis, and could barely drive home from work given  pain.  Since, she has not had any similar episodes.  She felt sore for a couple of days after this episode and has had some infrequent mild pelvic discomfort.  She saw her gynecologist the day after this episode of pain and had an ultrasound.  She has had several follow-up ultrasounds to follow an adnexal  cyst, the most recent was some features that raise concern for possible borderline or malignant pathology.  Patient endorses a good appetite without any nausea or emesis.  She takes probiotics regularly and does a bowel cleanse about once a month.  A year and a half ago she would have episodes where she would have increased bowel function as well as vomiting and it seemed as though there were certain foods that she could not digest well.  The symptoms have improved.  She notes having regular bowel function now.  She denies any urinary symptoms.  With regard to her GYN history.  She had menarche at age 66.  She describes her menses as irregular for the last year to year and a half.  Her last menstrual period was in March.  It is not infrequent for her to go up to 3-4 months in between menses.  Back in 2018, she had bleeding for several months and was ultimately diagnosed with a polyp that was removed.  She was on cyclic progesterone for a couple of months before her acute episode of pain in February.  She has not been on any sort of hormonal modulation since.  She describes having pain with her periods that requires taking ibuprofen every several hours.  It does not inhibit her daily activity although she describes these painful menstrual days as "just making it by".  Denies any pain with bowel function.   Medical history is notable for hidradenitis.  She gets symptoms in her armpits and groins.  She uses spironolactone daily and takes antibiotics if she has a flare.  She has a strong family history of cancer including a maternal uncle with colon cancer, maternal grandfather with prostate cancer, and sister with breast cancer.  Both her sister as well as the patient had genetic testing which was negative.  Patient lives in [place] with her parents.  She works as a Education officer, museum.   Findings:   Uterus 8.87 x 6.66 x 4.57 cm             Fibroids:             1) 2.3 x 2.03 cm             2) 1.13 x 1.13  cm             3) 1.86 x 1.24 cm             4) 3.03 x 2.81 cm   Endometrium 4.02 cm   Left ovary 3.93 x 2.46 x 1.67 cm   Right ovary 6.68 x 5.98 x 5.7 cm 6.23 x 4.47 cm thick walled cyst with a 2.01 x 1.79 nodule on the interior cyst wall with vascular flow.   No free fluid     Impression:  Persistent 6 cm right ovarian cyst, now with nodularity and vascular flow. Normal sized uterus with several small fibroids Thin endometrium Normal left ovary  PAST MEDICAL HISTORY:  Past Medical History:  Diagnosis Date   Anemia    Anxiety    Chest pain    Constipation    Depression    Family history of  breast cancer    Family history of colon cancer    Family history of ovarian cancer    Family history of prostate cancer    Hidradenitis suppurativa    PCOS (polycystic ovarian syndrome)    Prediabetes    Vitamin D deficiency      PAST SURGICAL HISTORY:  Past Surgical History:  Procedure Laterality Date   CERVICAL POLYPECTOMY     DILATATION & CURETTAGE/HYSTEROSCOPY WITH MYOSURE N/A 11/21/2016   Procedure: DILATATION & CURETTAGE/HYSTEROSCOPY WITH MYOSURE;  [name], MD;  Location: [facility];  Service: Gynecology;  [name];    OB/GYN HISTORY:  OB History  Gravida Para Term Preterm AB Living  0 0 0 0 0 0  SAB IAB Ectopic Multiple Live Births  0 0 0 0 0    No LMP recorded.  Age at menarche: 42 Age at menopause: n/a Hx of HRT: Denies Hx of STDs: Denies Last pap: 2021 History of abnormal pap smears: Denies  SCREENING STUDIES:  Last mammogram: 01/2020   MEDICATIONS: Outpatient Encounter Medications as of 12/14/2020  Medication Sig   cephALEXin (KEFLEX) 500 MG capsule Take 1 capsule (500 mg total) by mouth 4 (four) times daily.   clindamycin (CLEOCIN T) 1 % external solution Apply 1 application topically as needed (hidradenitis suppurativa flare).    sertraline (ZOLOFT) 100 MG tablet Take 2 tablets (200 mg total) by mouth daily.   spironolactone  (ALDACTONE) 50 MG tablet Take 50 mg by mouth daily.   ferrous sulfate 325 (65 FE) MG tablet Take 1 tablet (325 mg total) by mouth daily.   hydrOXYzine (VISTARIL) 25 MG capsule TAKE 25MG S BY MOUTH UP TO TWO TIMES DAILY AS NEEDED FOR ANXIETY (Patient not taking: No sig reported)   loratadine (CLARITIN) 10 MG tablet Take 1 tablet (10 mg total) by mouth daily. (Patient not taking: No sig reported)   medroxyPROGESTERone (PROVERA) 5 MG tablet Take one tablet a day x 5 days every 1-2 months if no spontaneous cycles. (Patient not taking: No sig reported)   VITAMIN D PO Take by mouth. (Patient not taking: No sig reported)   No facility-administered encounter medications on file as of 12/14/2020.    ALLERGIES:  Allergies  Allergen Reactions   Doxycycline     Upset stomach   Sulfamethoxazole-Trimethoprim     upset stomach     FAMILY HISTORY:  Family History  Problem Relation Age of Onset   Hypertension Mother    Depression Mother    Obesity Mother    Sleep apnea Father    Obesity Father    Colon cancer Maternal Uncle 27   Diabetes Paternal Aunt    Heart attack Maternal Grandmother    Prostate cancer Maternal Grandfather        dx 30s, metastatic   Stroke Paternal Grandfather    Breast cancer Half-Sister 3   Ovarian cancer Maternal Great-grandmother        dx ?, MGF's mother   Breast cancer Other        multiple of mother's paternal cousins   Ovarian cancer Other        multiple of mother's paternal cousins   Prostate cancer Other        multiple of mother's paternal cousins   Endometrial cancer Neg Hx    Pancreatic cancer Neg Hx      SOCIAL HISTORY:  Social Connections: Not on file    REVIEW OF SYSTEMS:  Denies appetite changes, fevers, chills, fatigue, unexplained weight  changes. Denies hearing loss, neck lumps or masses, mouth sores, ringing in ears or voice changes. Denies cough or wheezing.  Denies shortness of breath. Denies chest pain or palpitations. Denies leg  swelling. Denies abdominal distention, pain, blood in stools, constipation, diarrhea, nausea, vomiting, or early satiety. Denies pain with intercourse, dysuria, frequency, hematuria or incontinence. Denies hot flashes, pelvic pain, vaginal bleeding or vaginal discharge.   Denies joint pain, back pain or muscle pain/cramps. Denies itching, rash, or wounds. Denies dizziness, headaches, numbness or seizures. Denies swollen lymph nodes or glands, denies easy bruising or bleeding. Denies anxiety, depression, confusion, or decreased concentration.  Physical Exam:  Vital Signs for this encounter:  Blood pressure 108/84, pulse 63, temperature 97.8 F (36.6 C), temperature source Tympanic, resp. rate 18, height 5\' 6"  (1.676 m), weight 280 lb 4.8 oz (127.1 kg), SpO2 100 %. Body mass index is 45.24 kg/m. General: Alert, oriented, no acute distress.  HEENT: Normocephalic, atraumatic. Sclera anicteric.  Chest: Clear to auscultation bilaterally. No wheezes, rhonchi, or rales. Cardiovascular: Regular rate and rhythm, no murmurs, rubs, or gallops.  Abdomen: Obese. Normoactive bowel sounds. Soft, nondistended, nontender to palpation. No masses or hepatosplenomegaly appreciated. No palpable fluid wave.  Extremities: Grossly normal range of motion. Warm, well perfused. No edema bilaterally.  Skin: No rashes or lesions.  Lymphatics: No cervical, supraclavicular, or inguinal adenopathy.  GU:  Normal external female genitalia. No lesions. No discharge or bleeding.             Bladder/urethra:  No lesions or masses, well supported bladder             Vagina: Well rugated.  No lesions or masses.             Cervix: Normal appearing, no lesions.  Nulliparous.             Uterus: Small, mobile, no parametrial involvement or nodularity.             Adnexa: Fullness appreciated in the cul-de-sac, mobile, no nodularity.  LABORATORY AND RADIOLOGIC DATA:  Outside medical records were reviewed to synthesize the  above history, along with the history and physical obtained during the visit.   Lab Results  Component Value Date   WBC 8.5 04/09/2020   HGB 11.4 (L) 04/09/2020   HCT 35.1 (L) 04/09/2020   PLT 304.0 04/09/2020   GLUCOSE 80 04/09/2020   CHOL 140 04/09/2020   TRIG 97.0 04/09/2020   HDL 39.30 04/09/2020   LDLCALC 81 04/09/2020   ALT 14 04/09/2020   AST 13 04/09/2020   NA 137 04/09/2020   K 3.7 04/09/2020   CL 102 04/09/2020   CREATININE 0.54 04/09/2020   BUN 11 04/09/2020   CO2 27 04/09/2020   TSH 2.69 04/09/2020   HGBA1C 5.9 04/09/2020   CA-125 on 6/9: 61   Pelvic ultrasound on 3/16: Findings: Uterus 8.82 x 6.3 x 4.53 cm             Fibroids:             1) 2.25 x 2.09 cm, intramural             2) 1.62 x 1.33 cm, intramural             3) 1.54 x 1.51 cm, intramural             4) 2.07 x 1.76 cm, intramural             5) 2.73 x 2.71  cm, pedunculated (right lateral uterus) Endometrium 5.76 mm Left ovary 3.88 x 2.95 x 2.31 cm Right ovary 7.25 x 6.98 x 6.9 cm             6.71 x 4.76 cm complex right ovarian cyst. 2 solid areas noted: 19 x 15 mm and 16 x 16 mm. No increased vascularity. no free fluid Impression:  Anteverted uterus with multiple myomas, non impinging on the uterine cavity Complex right ovarian cyst, suspect hemorrhagic CL   Pelvic ultrasound on 4/5: Findings: Anteverted, normal sized uterus with several small myomas, not remeasured Endometrium 5.27 mm Left ovary 3.78 x 2.79 x 2.89 cm Right ovary 6.85 x 6.74 x 4.65 cm             6.73 x 4.04 cm ovarian cyst, complex. The complexity is at the edge of the cyst. No vascularity in the solid appearing areas. No free fluid Impression:  Normal uterus, fibroids from last month not measured again Persistent complex right ovarian cyst, images compared with last months images and the complexity seems to be improving. On prior imaging there was a septum and solid appearing areas in the middle of the cyst. Now no  septum is seem and the solid appearing areas are less obvious and on the edge of the cyst. No vascularity. Suspect slow resolution of a hemorrhagic CL   Pelvic ultrasound on 11/25/20: Uterus 8.87 x 6.66 x 4.57 cm             Fibroids:             1) 2.3 x 2.03 cm             2) 1.13 x 1.13 cm             3) 1.86 x 1.24 cm             4) 3.03 x 2.81 cm Endometrium 4.02 cm Left ovary 3.93 x 2.46 x 1.67 cm Right ovary 6.68 x 5.98 x 5.7 cm 6.23 x 4.47 cm thick walled cyst with a 2.01 x 1.79 nodule on the interior cyst wall with vascular flow. No free fluid Impression:  Persistent 6 cm right ovarian cyst, now with nodularity and vascular flow. Normal sized uterus with several small fibroids Thin endometrium Normal left ovary

## 2020-12-14 ENCOUNTER — Other Ambulatory Visit: Payer: Self-pay | Admitting: Gynecologic Oncology

## 2020-12-14 ENCOUNTER — Encounter: Payer: Self-pay | Admitting: Gynecologic Oncology

## 2020-12-14 ENCOUNTER — Inpatient Hospital Stay: Payer: BC Managed Care – PPO | Attending: Gynecologic Oncology | Admitting: Gynecologic Oncology

## 2020-12-14 ENCOUNTER — Other Ambulatory Visit: Payer: Self-pay

## 2020-12-14 ENCOUNTER — Inpatient Hospital Stay (HOSPITAL_BASED_OUTPATIENT_CLINIC_OR_DEPARTMENT_OTHER): Payer: BC Managed Care – PPO | Admitting: Gynecologic Oncology

## 2020-12-14 VITALS — BP 108/84 | HR 63 | Temp 97.8°F | Resp 18 | Ht 66.0 in | Wt 280.3 lb

## 2020-12-14 DIAGNOSIS — Z800 Family history of malignant neoplasm of digestive organs: Secondary | ICD-10-CM | POA: Diagnosis not present

## 2020-12-14 DIAGNOSIS — Z793 Long term (current) use of hormonal contraceptives: Secondary | ICD-10-CM | POA: Insufficient documentation

## 2020-12-14 DIAGNOSIS — N83201 Unspecified ovarian cyst, right side: Secondary | ICD-10-CM | POA: Insufficient documentation

## 2020-12-14 DIAGNOSIS — Z79899 Other long term (current) drug therapy: Secondary | ICD-10-CM | POA: Diagnosis not present

## 2020-12-14 DIAGNOSIS — E559 Vitamin D deficiency, unspecified: Secondary | ICD-10-CM | POA: Diagnosis not present

## 2020-12-14 DIAGNOSIS — N970 Female infertility associated with anovulation: Secondary | ICD-10-CM | POA: Diagnosis not present

## 2020-12-14 DIAGNOSIS — L732 Hidradenitis suppurativa: Secondary | ICD-10-CM | POA: Insufficient documentation

## 2020-12-14 DIAGNOSIS — F419 Anxiety disorder, unspecified: Secondary | ICD-10-CM | POA: Insufficient documentation

## 2020-12-14 DIAGNOSIS — Z8041 Family history of malignant neoplasm of ovary: Secondary | ICD-10-CM | POA: Diagnosis not present

## 2020-12-14 DIAGNOSIS — E282 Polycystic ovarian syndrome: Secondary | ICD-10-CM | POA: Diagnosis not present

## 2020-12-14 DIAGNOSIS — Z8042 Family history of malignant neoplasm of prostate: Secondary | ICD-10-CM | POA: Diagnosis not present

## 2020-12-14 DIAGNOSIS — Z803 Family history of malignant neoplasm of breast: Secondary | ICD-10-CM | POA: Insufficient documentation

## 2020-12-14 HISTORY — DX: Unspecified ovarian cyst, right side: N83.201

## 2020-12-14 NOTE — Patient Instructions (Signed)
Plan to have an MRI on July 5 and Dr. Berline Lopes will call you to discuss the results.   Preparing for your Surgery  Plan for surgery on December 30, 2020 with Dr. Jeral Pinch at [place] will be scheduled for a robotic assisted laparoscopic ovarian cystectomy (removal of cyst from the ovary), possible oophorectomy (removal of one ovary), possible fertility sparing staging if a cancer is identified.   Pre-operative Testing -You will receive a phone call from presurgical testing at [facility] to arrange for a pre-operative appointment and lab work.  -Bring your insurance card, copy of an advanced directive if applicable, medication list  -At that visit, you will be asked to sign a consent for a possible blood transfusion in case a transfusion becomes necessary during surgery.  The need for a blood transfusion is rare but having consent is a necessary part of your care.     -You should not be taking blood thinners or aspirin at least ten days prior to surgery unless instructed by your surgeon.  -Do not take supplements such as fish oil (omega 3), red yeast rice, turmeric before your surgery. You want to avoid medications with aspirin in them including headache powders such as BC or Goody's), Excedrin migraine.  Day Before Surgery at [place] will be asked to take in a light diet the day before surgery. You will be advised you can have clear liquids up until 3 hours before your surgery.    Eat a light diet the day before surgery.  Examples including soups, broths, toast, yogurt, mashed potatoes.  AVOID GAS PRODUCING FOODS. Things to avoid include carbonated beverages (fizzy beverages, sodas), raw fruits and raw vegetables (uncooked), or beans.   If your bowels are filled with gas, your surgeon will have difficulty visualizing your pelvic organs which increases your surgical risks.  Your role in recovery Your role is to become active as soon as directed by your doctor,  while still giving yourself time to heal.  Rest when you feel tired. You will be asked to do the following in order to speed your recovery:  - Cough and breathe deeply. This helps to clear and expand your lungs and can prevent pneumonia after surgery.  - [place]. Do mild physical activity. Walking or moving your legs help your circulation and body functions return to normal. Do not try to get up or walk alone the first time after surgery.   -If you develop swelling on one leg or the other, pain in the back of your leg, redness/warmth in one of your legs, please call the office or go to the Emergency Room to have a doppler to rule out a blood clot. For shortness of breath, chest pain-seek care in the Emergency Room as soon as possible. - Actively manage your pain. Managing your pain lets you move in comfort. We will ask you to rate your pain on a scale of zero to 10. It is your responsibility to tell your doctor or nurse where and how much you hurt so your pain can be treated.  Special Considerations -If you are diabetic, you may be placed on insulin after surgery to have closer control over your blood sugars to promote healing and recovery.  This does not mean that you will be discharged on insulin.  If applicable, your oral antidiabetics will be resumed when you are tolerating a solid diet.  -Your final pathology results from surgery should be  available around one week after surgery and the results will be relayed to you when available.  -Dr. Lahoma Crocker is the surgeon that assists your GYN Oncologist with surgery.  If you end up staying the night, the next day after your surgery you will either see Dr. Denman George, Dr. Berline Lopes, or Dr. Lahoma Crocker.  -FMLA forms can be faxed to [phone] and please allow 5-7 business days for completion.  Pain Management After Surgery -You will be prescribed your pain medication and bowel regimen medications before surgery so that you  can have these available when you are discharged from the hospital. The pain medication is for use ONLY AFTER surgery and a new prescription will not be given.   -Make sure that you have Tylenol and Ibuprofen at home to use on a regular basis after surgery for pain control. We recommend alternating the medications every hour to six hours since they work differently and are processed in the body differently for pain relief.  -Review the attached handout on narcotic use and their risks and side effects.   Bowel Regimen -You will be prescribed Sennakot-S to take nightly to prevent constipation especially if you are taking the narcotic pain medication intermittently.  It is important to prevent constipation and drink adequate amounts of liquids. You can stop taking this medication when you are not taking pain medication and you are back on your normal bowel routine.  Risks of Surgery Risks of surgery are low but include bleeding, infection, damage to surrounding structures, re-operation, blood clots, and very rarely death.   Blood Transfusion Information (For the consent to be signed before surgery)  We will be checking your blood type before surgery so in case of emergencies, we will know what type of blood you would need.                                            WHAT IS A BLOOD TRANSFUSION?  A transfusion is the replacement of blood or some of its parts. Blood is made up of multiple cells which provide different functions. Red blood cells carry oxygen and are used for blood loss replacement. White blood cells fight against infection. Platelets control bleeding. Plasma helps clot blood. Other blood products are available for specialized needs, such as hemophilia or other clotting disorders. BEFORE THE TRANSFUSION  Who gives blood for transfusions?  You may be able to donate blood to be used at a later date on yourself (autologous donation). Relatives can be asked to donate blood. This is  generally not any safer than if you have received blood from a stranger. The same precautions are taken to ensure safety when a relative's blood is donated. Healthy volunteers who are fully evaluated to make sure their blood is safe. This is blood bank blood. Transfusion therapy is the safest it has ever been in the practice of medicine. Before blood is taken from a donor, a complete history is taken to make sure that person has no history of diseases nor engages in risky social behavior (examples are intravenous drug use or sexual activity with multiple partners). The donor's travel history is screened to minimize risk of transmitting infections, such as malaria. The donated blood is tested for signs of infectious diseases, such as HIV and hepatitis. The blood is then tested to be sure it is compatible with you in order to minimize  the chance of a transfusion reaction. If you or a relative donates blood, this is often done in anticipation of surgery and is not appropriate for emergency situations. It takes many days to process the donated blood. RISKS AND COMPLICATIONS Although transfusion therapy is very safe and saves many lives, the main dangers of transfusion include:  Getting an infectious disease. Developing a transfusion reaction. This is an allergic reaction to something in the blood you were given. Every precaution is taken to prevent this. The decision to have a blood transfusion has been considered carefully by your caregiver before blood is given. Blood is not given unless the benefits outweigh the risks.  AFTER SURGERY INSTRUCTIONS  Return to work: 4 weeks if applicable  Activity: 1. Be up and out of the bed during the day.  Take a nap if needed.  You may walk up steps but be careful and use the hand rail.  Stair climbing will tire you more than you think, you may need to stop part way and rest.   2. No lifting or straining for 6 weeks over 10 pounds. No pushing, pulling, straining for  6 weeks.  3. No driving for around 1 week(s).  Do not drive if you are taking narcotic pain medicine and make sure that your reaction time has returned.   4. You can shower as soon as the next day after surgery. Shower daily.  Use your regular soap and water (not directly on the incision) and pat your incision(s) dry afterwards; don't rub.  No tub baths or submerging your body in water until cleared by your surgeon. If you have the soap that was given to you by pre-surgical testing that was used before surgery, you do not need to use it afterwards because this can irritate your incisions.   5. No sexual activity and nothing in the vagina for 4 weeks.  6. You may experience a small amount of clear drainage from your incisions, which is normal.  If the drainage persists, increases, or changes color please call the office.  7. Do not use creams, lotions, or ointments such as neosporin on your incisions after surgery until advised by your surgeon because they can cause removal of the dermabond glue on your incisions.    8. You may experience vaginal spotting after surgery.  The spotting is normal but if you experience heavy bleeding, call our office.  9. Take Tylenol or ibuprofen first for pain and only use narcotic pain medication for severe pain not relieved by the Tylenol or Ibuprofen.  Monitor your Tylenol intake to a max of 4,000 mg in a 24 hour period. You can alternate these medications after surgery.  Diet: 1. Low sodium Heart Healthy Diet is recommended but you are cleared to resume your normal (before surgery) diet after your procedure.  2. It is safe to use a laxative, such as Miralax or Colace, if you have difficulty moving your bowels. You have been prescribed Sennakot at bedtime every evening to keep bowel movements regular and to prevent constipation.    Wound Care: 1. Keep clean and dry.  Shower daily.  Reasons to call the Doctor: Fever - Oral temperature greater than 100.4  degrees Fahrenheit Foul-smelling vaginal discharge Difficulty urinating Nausea and vomiting Increased pain at the site of the incision that is unrelieved with pain medicine. Difficulty breathing with or without chest pain New calf pain especially if only on one side Sudden, continuing increased vaginal bleeding with or without clots.  Contacts: For questions or concerns you should contact:  Dr. Jeral Pinch at [phone]  Joylene John, NP at [phone]  After Hours: call [phone] and have the GYN Oncologist paged/contacted (after 5 pm or on the weekends).  Messages sent via mychart are for non-urgent matters and are not responded to after hours so for urgent needs, please call the after hours number.

## 2020-12-16 NOTE — Progress Notes (Signed)
DUE TO COVID-19 ONLY ONE VISITOR IS ALLOWED TO COME WITH YOU AND STAY IN THE WAITING ROOM ONLY DURING PRE OP AND PROCEDURE DAY OF SURGERY. THE 1 VISITOR  MAY VISIT WITH YOU AFTER SURGERY IN YOUR PRIVATE ROOM DURING VISITING HOURS ONLY!  YOU NEED TO HAVE A COVID 19 TEST ON_______ @_______ , THIS TEST MUST BE DONE BEFORE SURGERY,  COVID TESTING SITE [address] [place] [address], IT IS ON THE RIGHT GOING OUT [address] APPROXIMATELY  2 MINUTES PAST [employer] ON THE RIGHT. ONCE YOUR COVID TEST IS COMPLETED,  PLEASE BEGIN THE QUARANTINE INSTRUCTIONS AS OUTLINED IN YOUR HANDOUT.                Kendra Allen  12/16/2020   Your procedure is scheduled on:      12/30/20/  Report to [facility] Main  Entrance   Report to admitting at    0830AM     Call this number if you have problems the morning of surgery [phone]    REMEMBER: NO  SOLID FOOD CANDY OR GUM AFTER MIDNIGHT. CLEAR LIQUIDS UNTIL     0730 am        . NOTHING BY MOUTH EXCEPT CLEAR LIQUIDS UNTIL    .     CLEAR LIQUID DIET   Foods Allowed                                                                    Coffee and tea, regular and decaf                            Fruit ices (not with fruit pulp)                                      Iced Popsicles                                    Carbonated beverages, regular and diet                                    Cranberry, grape and apple juices Sports drinks like Gatorade Lightly seasoned clear broth or consume(fat free) Sugar, honey syrup ___________________________________________________________________      BRUSH YOUR TEETH MORNING OF SURGERY AND RINSE YOUR MOUTH OUT, NO CHEWING GUM CANDY OR MINTS.     Take these medicines the morning of surgery with A SIP OF WATER:      Zoloft, claritin  DO NOT TAKE ANY DIABETIC MEDICATIONS DAY OF YOUR SURGERY                               You may not have any metal on your body including hair pins  and              piercings  Do not wear jewelry, make-up, lotions, powders or perfumes, deodorant  Do not wear nail polish on your fingernails.  Do not shave  48 hours prior to surgery.              Men may shave face and neck.   Do not bring valuables to the hospital. [place].  Contacts, dentures or bridgework may not be worn into surgery.  Leave suitcase in the car. After surgery it may be brought to your room.     Patients discharged the day of surgery will not be allowed to drive home. IF YOU ARE HAVING SURGERY AND GOING HOME THE SAME DAY, YOU MUST HAVE AN ADULT TO DRIVE YOU HOME AND BE WITH YOU FOR 24 HOURS. YOU MAY GO HOME BY TAXI OR UBER OR ORTHERWISE, BUT AN ADULT MUST ACCOMPANY YOU HOME AND STAY WITH YOU FOR 24 HOURS.  Name and phone number of your driver:  Special Instructions: N/A              Please read over the following fact sheets you were given: _____________________________________________________________________  [facility] - Preparing for Surgery Before surgery, you can play an important role.  Because skin is not sterile, your skin needs to be as free of germs as possible.  You can reduce the number of germs on your skin by washing with CHG (chlorahexidine gluconate) soap before surgery.  CHG is an antiseptic cleaner which kills germs and bonds with the skin to continue killing germs even after washing. Please DO NOT use if you have an allergy to CHG or antibacterial soaps.  If your skin becomes reddened/irritated stop using the CHG and inform your nurse when you arrive at Short Stay. Do not shave (including legs and underarms) for at least 48 hours prior to the first CHG shower.  You may shave your face/neck. Please follow these instructions carefully:  1.  Shower with CHG Soap the night before surgery and the  morning of Surgery.  2.  If you choose to wash your hair, wash your hair first as usual with your   normal  shampoo.  3.  After you shampoo, rinse your hair and body thoroughly to remove the  shampoo.                           4.  Use CHG as you would any other liquid soap.  You can apply chg directly  to the skin and wash                       Gently with a scrungie or clean washcloth.  5.  Apply the CHG Soap to your body ONLY FROM THE NECK DOWN.   Do not use on face/ open                           Wound or open sores. Avoid contact with eyes, ears mouth and genitals (private parts).                       Wash face,  Genitals (private parts) with your normal soap.             6.  Wash thoroughly, paying special attention to the area where your surgery  will be performed.  7.  Thoroughly rinse your body with  warm water from the neck down.  8.  DO NOT shower/wash with your normal soap after using and rinsing off  the CHG Soap.                9.  Pat yourself dry with a clean towel.            10.  Wear clean pajamas.            11.  Place clean sheets on your bed the night of your first shower and do not  sleep with pets. Day of Surgery : Do not apply any lotions/deodorants the morning of surgery.  Please wear clean clothes to the hospital/surgery center.  FAILURE TO FOLLOW THESE INSTRUCTIONS MAY RESULT IN THE CANCELLATION OF YOUR SURGERY PATIENT SIGNATURE_________________________________  NURSE SIGNATURE__________________________________  ________________________________________________________________________

## 2020-12-16 NOTE — Addendum Note (Signed)
Addended by: Lafonda Mosses on: 12/16/2020 11:02 AM   Modules accepted: Level of Service

## 2020-12-16 NOTE — Progress Notes (Signed)
Patient here for consultation with Dr. Berline Lopes and for a pre-operative discussion prior to her scheduled surgery on December 30, 2020. She is scheduled for a robotic assisted laparoscopic ovarian cystectomy (removal of cyst from the ovary), possible oophorectomy (removal of one ovary), possible fertility sparing staging if a cancer is identified.  The surgery was discussed in detail. See after visit summary for additional details.    Her post-operative medications will be prescribed after her discussion with Dr. Berline Lopes about her upcoming MRI. We discussed the use of tylenol post-op and to monitor for a maximum of 4,000 mg in a 24 hour period.  Also will be prescribed sennakot to be used after surgery and to hold if having loose stools.  Discussed bowel regimen in detail.     Discussed measures to take at home to prevent DVT including frequent mobility.  Reportable signs and symptoms of DVT discussed. Post-operative instructions discussed and expectations for after surgery. Incisional care discussed as well including reportable signs and symptoms including erythema, drainage, wound separation.     10 minutes spent with the patient.  Verbalizing understanding of material discussed. No needs or concerns voiced at the end of the visit.   Advised patient and family to call for any needs.  She is scheduled for an MRI with a phone visit after to discuss results with Dr. Berline Lopes.  This appt is included in the global surgical fee for pre-operative instructions and has no charge.

## 2020-12-16 NOTE — Patient Instructions (Signed)
Plan to have an MRI on July 5 and Dr. Berline Lopes will call you to discuss the results.    Preparing for your Surgery   Plan for surgery on December 30, 2020 with Dr. Jeral Pinch at [place] will be scheduled for a robotic assisted laparoscopic ovarian cystectomy (removal of cyst from the ovary), possible oophorectomy (removal of one ovary), possible fertility sparing staging if a cancer is identified.   Pre-operative Testing -You will receive a phone call from presurgical testing at [facility] to arrange for a pre-operative appointment and lab work.   -Bring your insurance card, copy of an advanced directive if applicable, medication list   -At that visit, you will be asked to sign a consent for a possible blood transfusion in case a transfusion becomes necessary during surgery.  The need for a blood transfusion is rare but having consent is a necessary part of your care.      -You should not be taking blood thinners or aspirin at least ten days prior to surgery unless instructed by your surgeon.   -Do not take supplements such as fish oil (omega 3), red yeast rice, turmeric before your surgery. You want to avoid medications with aspirin in them including headache powders such as BC or Goody's), Excedrin migraine.   Day Before Surgery at [place] will be asked to take in a light diet the day before surgery. You will be advised you can have clear liquids up until 3 hours before your surgery.     Eat a light diet the day before surgery.  Examples including soups, broths, toast, yogurt, mashed potatoes.  AVOID GAS PRODUCING FOODS. Things to avoid include carbonated beverages (fizzy beverages, sodas), raw fruits and raw vegetables (uncooked), or beans.   If your bowels are filled with gas, your surgeon will have difficulty visualizing your pelvic organs which increases your surgical risks.   Your role in recovery Your role is to become active as soon as directed by your  doctor, while still giving yourself time to heal.  Rest when you feel tired. You will be asked to do the following in order to speed your recovery:   - Cough and breathe deeply. This helps to clear and expand your lungs and can prevent pneumonia after surgery. - Bath. Do mild physical activity. Walking or moving your legs help your circulation and body functions return to normal. Do not try to get up or walk alone the first time after surgery.   -If you develop swelling on one leg or the other, pain in the back of your leg, redness/warmth in one of your legs, please call the office or go to the Emergency Room to have a doppler to rule out a blood clot. For shortness of breath, chest pain-seek care in the Emergency Room as soon as possible. - Actively manage your pain. Managing your pain lets you move in comfort. We will ask you to rate your pain on a scale of zero to 10. It is your responsibility to tell your doctor or nurse where and how much you hurt so your pain can be treated.   Special Considerations -If you are diabetic, you may be placed on insulin after surgery to have closer control over your blood sugars to promote healing and recovery.  This does not mean that you will be discharged on insulin.  If applicable, your oral antidiabetics will be resumed when you are tolerating a solid  diet.   -Your final pathology results from surgery should be available around one week after surgery and the results will be relayed to you when available.   -Dr. Lahoma Crocker is the surgeon that assists your GYN Oncologist with surgery.  If you end up staying the night, the next day after your surgery you will either see Dr. Denman George, Dr. Berline Lopes, or Dr. Lahoma Crocker.   -FMLA forms can be faxed to [phone] and please allow 5-7 business days for completion.   Pain Management After Surgery -You will be prescribed your pain medication and bowel regimen medications before  surgery so that you can have these available when you are discharged from the hospital. The pain medication is for use ONLY AFTER surgery and a new prescription will not be given.   -Make sure that you have Tylenol and Ibuprofen at home to use on a regular basis after surgery for pain control. We recommend alternating the medications every hour to six hours since they work differently and are processed in the body differently for pain relief.   -Review the attached handout on narcotic use and their risks and side effects.   Bowel Regimen -You will be prescribed Sennakot-S to take nightly to prevent constipation especially if you are taking the narcotic pain medication intermittently.  It is important to prevent constipation and drink adequate amounts of liquids. You can stop taking this medication when you are not taking pain medication and you are back on your normal bowel routine.   Risks of Surgery Risks of surgery are low but include bleeding, infection, damage to surrounding structures, re-operation, blood clots, and very rarely death.     Blood Transfusion Information (For the consent to be signed before surgery)   We will be checking your blood type before surgery so in case of emergencies, we will know what type of blood you would need.                                             WHAT IS A BLOOD TRANSFUSION?   A transfusion is the replacement of blood or some of its parts. Blood is made up of multiple cells which provide different functions. Red blood cells carry oxygen and are used for blood loss replacement. White blood cells fight against infection. Platelets control bleeding. Plasma helps clot blood. Other blood products are available for specialized needs, such as hemophilia or other clotting disorders. BEFORE THE TRANSFUSION Who gives blood for transfusions? You may be able to donate blood to be used at a later date on yourself (autologous donation). Relatives can be asked  to donate blood. This is generally not any safer than if you have received blood from a stranger. The same precautions are taken to ensure safety when a relative's blood is donated. Healthy volunteers who are fully evaluated to make sure their blood is safe. This is blood bank blood. Transfusion therapy is the safest it has ever been in the practice of medicine. Before blood is taken from a donor, a complete history is taken to make sure that person has no history of diseases nor engages in risky social behavior (examples are intravenous drug use or sexual activity with multiple partners). The donor's travel history is screened to minimize risk of transmitting infections, such as malaria. The donated blood is tested for signs of infectious diseases, such as HIV  and hepatitis. The blood is then tested to be sure it is compatible with you in order to minimize the chance of a transfusion reaction. If you or a relative donates blood, this is often done in anticipation of surgery and is not appropriate for emergency situations. It takes many days to process the donated blood. RISKS AND COMPLICATIONS Although transfusion therapy is very safe and saves many lives, the main dangers of transfusion include: Getting an infectious disease. Developing a transfusion reaction. This is an allergic reaction to something in the blood you were given. Every precaution is taken to prevent this. The decision to have a blood transfusion has been considered carefully by your caregiver before blood is given. Blood is not given unless the benefits outweigh the risks.   AFTER SURGERY INSTRUCTIONS   Return to work: 4 weeks if applicable   Activity: 1. Be up and out of the bed during the day.  Take a nap if needed.  You may walk up steps but be careful and use the hand rail.  Stair climbing will tire you more than you think, you may need to stop part way and rest.   2. No lifting or straining for 6 weeks over 10 pounds. No  pushing, pulling, straining for 6 weeks.   3. No driving for around 1 week(s).  Do not drive if you are taking narcotic pain medicine and make sure that your reaction time has returned.   4. You can shower as soon as the next day after surgery. Shower daily.  Use your regular soap and water (not directly on the incision) and pat your incision(s) dry afterwards; don't rub.  No tub baths or submerging your body in water until cleared by your surgeon. If you have the soap that was given to you by pre-surgical testing that was used before surgery, you do not need to use it afterwards because this can irritate your incisions.   5. No sexual activity and nothing in the vagina for 4 weeks.   6. You may experience a small amount of clear drainage from your incisions, which is normal.  If the drainage persists, increases, or changes color please call the office.   7. Do not use creams, lotions, or ointments such as neosporin on your incisions after surgery until advised by your surgeon because they can cause removal of the dermabond glue on your incisions.     8. You may experience vaginal spotting after surgery.  The spotting is normal but if you experience heavy bleeding, call our office.   9. Take Tylenol or ibuprofen first for pain and only use narcotic pain medication for severe pain not relieved by the Tylenol or Ibuprofen.  Monitor your Tylenol intake to a max of 4,000 mg in a 24 hour period. You can alternate these medications after surgery.   Diet: 1. Low sodium Heart Healthy Diet is recommended but you are cleared to resume your normal (before surgery) diet after your procedure.   2. It is safe to use a laxative, such as Miralax or Colace, if you have difficulty moving your bowels. You have been prescribed Sennakot at bedtime every evening to keep bowel movements regular and to prevent constipation.     Wound Care: 1. Keep clean and dry.  Shower daily.   Reasons to call the Doctor: Fever -  Oral temperature greater than 100.4 degrees Fahrenheit Foul-smelling vaginal discharge Difficulty urinating Nausea and vomiting Increased pain at the site of the incision that is  unrelieved with pain medicine. Difficulty breathing with or without chest pain New calf pain especially if only on one side Sudden, continuing increased vaginal bleeding with or without clots.   Contacts: For questions or concerns you should contact:   Dr. Jeral Pinch at [phone]   Joylene John, NP at [phone]   After Hours: call [phone] and have the GYN Oncologist paged/contacted (after 5 pm or on the weekends).   Messages sent via mychart are for non-urgent matters and are not responded to after hours so for urgent needs, please call the after hours number.

## 2020-12-17 ENCOUNTER — Encounter: Payer: BC Managed Care – PPO | Admitting: Gynecologic Oncology

## 2020-12-21 ENCOUNTER — Ambulatory Visit (HOSPITAL_COMMUNITY)
Admission: RE | Admit: 2020-12-21 | Discharge: 2020-12-21 | Disposition: A | Discharge status: Home / Self Care | Payer: BC Managed Care – PPO | Source: Ambulatory Visit | Attending: Gynecologic Oncology | Admitting: Gynecologic Oncology

## 2020-12-21 ENCOUNTER — Other Ambulatory Visit: Payer: Self-pay

## 2020-12-21 ENCOUNTER — Encounter (HOSPITAL_COMMUNITY): Payer: Self-pay

## 2020-12-21 ENCOUNTER — Encounter (HOSPITAL_COMMUNITY)
Admission: RE | Admit: 2020-12-21 | Discharge: 2020-12-21 | Disposition: A | Discharge status: Home / Self Care | Payer: BC Managed Care – PPO | Source: Ambulatory Visit | Attending: Gynecologic Oncology | Admitting: Gynecologic Oncology

## 2020-12-21 DIAGNOSIS — N83201 Unspecified ovarian cyst, right side: Secondary | ICD-10-CM | POA: Diagnosis not present

## 2020-12-21 HISTORY — DX: Sleep apnea, unspecified: G47.30

## 2020-12-21 LAB — CBC
HCT: 36.7 % (ref 36.0–46.0)
Hemoglobin: 11.7 g/dL — ABNORMAL LOW (ref 12.0–15.0)
MCH: 25.7 pg — ABNORMAL LOW (ref 26.0–34.0)
MCHC: 31.9 g/dL (ref 30.0–36.0)
MCV: 80.5 fL (ref 80.0–100.0)
Platelets: 338 10*3/uL (ref 150–400)
RBC: 4.56 MIL/uL (ref 3.87–5.11)
RDW: 15.4 % (ref 11.5–15.5)
WBC: 9.1 10*3/uL (ref 4.0–10.5)
nRBC: 0 % (ref 0.0–0.2)

## 2020-12-21 LAB — COMPREHENSIVE METABOLIC PANEL
ALT: 17 U/L (ref 0–44)
AST: 16 U/L (ref 15–41)
Albumin: 4.1 g/dL (ref 3.5–5.0)
Alkaline Phosphatase: 58 U/L (ref 38–126)
Anion gap: 9 (ref 5–15)
BUN: 11 mg/dL (ref 6–20)
CO2: 24 mmol/L (ref 22–32)
Calcium: 9.6 mg/dL (ref 8.9–10.3)
Chloride: 106 mmol/L (ref 98–111)
Creatinine, Ser: 0.45 mg/dL (ref 0.44–1.00)
GFR, Estimated: >60 mL/min (ref >60)
Glucose, Bld: 81 mg/dL (ref 70–99)
Potassium: 3.8 mmol/L (ref 3.5–5.1)
Sodium: 139 mmol/L (ref 135–145)
Total Bilirubin: 0.7 mg/dL (ref 0.3–1.2)
Total Protein: 7.9 g/dL (ref 6.5–8.1)

## 2020-12-21 MED ORDER — GADOBUTROL 1 MMOL/ML IV SOLN
10.0000 mL | Freq: Once | INTRAVENOUS | Status: AC | PRN
  Administered 2020-12-21: 10 mL via INTRAVENOUS

## 2020-12-21 NOTE — Progress Notes (Signed)
Anesthesia Review:  PCP: DR Plotnikov  Cardiologist : none  Chest x-ray : EKG : Echo : Stress test: Cardiac Cath :  Activity level: can do a flight of stairs without difficulty  Sleep Study/ CPAP : mild per pt gets oral device week of 12/27/20  Fasting Blood Sugar :      / Checks Blood Sugar -- times a day:   Blood Thinner/ Instructions /Last Dose: ASA / Instructions/ Last Dose :

## 2020-12-23 ENCOUNTER — Telehealth: Payer: Self-pay | Admitting: Internal Medicine

## 2020-12-23 ENCOUNTER — Encounter: Payer: Self-pay | Admitting: Gynecologic Oncology

## 2020-12-23 ENCOUNTER — Inpatient Hospital Stay: Payer: BC Managed Care – PPO | Attending: Gynecologic Oncology | Admitting: Gynecologic Oncology

## 2020-12-23 DIAGNOSIS — N83201 Unspecified ovarian cyst, right side: Secondary | ICD-10-CM | POA: Diagnosis not present

## 2020-12-23 NOTE — Telephone Encounter (Signed)
   Patients mother called and said that the patient has a consultation for oncology today at 3:30pm. She also said that the patient is having surgery on 7/14 and had an MRI on 7/5. She said that she would appreciate any questions or comments that Dr. Alain Marion may have. She can be reached at [phone] or [phone]

## 2020-12-23 NOTE — Progress Notes (Signed)
Gynecologic Oncology Telehealth Consult Note: Gyn-Onc  I connected with [name] on 12/23/20 at  3:30 PM EDT by telephone and verified that I am speaking with the correct person using two identifiers.  I discussed the limitations, risks, security and privacy concerns of performing an evaluation and management service by telemedicine and the availability of in-person appointments. I also discussed with the patient that there may be a patient responsible charge related to this service. The patient expressed understanding and agreed to proceed.  Other persons participating in the visit and their role in the encounter: Patient's mother.  Patient's location: Home Provider's location: [facility]  Reason for Visit: Follow-up MRI results  Interval History: Patient reports doing well, no significant changes since her recent visit with me.  Underwent an MRI this week to further characterize adnexal mass.  Past Medical/Surgical History: Past Medical History:  Diagnosis Date   Anemia    Anxiety    Chest pain    Constipation    Depression    Family history of breast cancer    Family history of colon cancer    Family history of ovarian cancer    Family history of prostate cancer    Hidradenitis suppurativa    PCOS (polycystic ovarian syndrome)    Prediabetes    Sleep apnea    Vitamin D deficiency     Past Surgical History:  Procedure Laterality Date   CERVICAL POLYPECTOMY     DILATATION & CURETTAGE/HYSTEROSCOPY WITH MYOSURE N/A 11/21/2016   Procedure: DILATATION & CURETTAGE/HYSTEROSCOPY WITH MYOSURE;  [name], MD;  Location: [facility];  Service: Gynecology;  [name];   WISDOM TOOTH EXTRACTION      Family History  Problem Relation Age of Onset   Hypertension Mother    Depression Mother    Obesity Mother    Sleep apnea Father    Obesity Father    Colon cancer Maternal Uncle 34   Diabetes Paternal Aunt    Heart attack Maternal  Grandmother    Prostate cancer Maternal Grandfather        dx 5s, metastatic   Stroke Paternal Grandfather    Breast cancer Half-Sister 55   Ovarian cancer Maternal Great-grandmother        dx ?, MGF's mother   Breast cancer Other        multiple of mother's paternal cousins   Ovarian cancer Other        multiple of mother's paternal cousins   Prostate cancer Other        multiple of mother's paternal cousins   Endometrial cancer Neg Hx    Pancreatic cancer Neg Hx     Social History   Socioeconomic History   Marital status: Single    Spouse name: Not on file   Number of children: 0   Years of education: Not on file   Highest education level: Not on file  Occupational History   Occupation: school teacher  Tobacco Use   Smoking status: Never   Smokeless tobacco: Never  Vaping Use   Vaping Use: Never used  Substance and Sexual Activity   Alcohol use: Yes    Comment: social   Drug use: Never   Sexual activity: Not Currently  Other Topics Concern   Not on file  Social History Narrative   Not on file   Social Determinants of Health   Financial Resource Strain: Not on file  Food Insecurity: Not on file  Transportation Needs: Not  on file  Physical Activity: Not on file  Stress: Not on file  Social Connections: Not on file    Current Medications:  Current Outpatient Medications:    cephALEXin (KEFLEX) 500 MG capsule, Take 1 capsule (500 mg total) by mouth 4 (four) times daily., Disp: 40 capsule, Rfl: 0   clindamycin (CLEOCIN T) 1 % external solution, Apply 1 application topically as needed (hidradenitis suppurativa flare). , Disp: , Rfl:    ferrous sulfate 325 (65 FE) MG tablet, Take 1 tablet (325 mg total) by mouth daily. (Patient not taking: Reported on 12/15/2020), Disp: 90 tablet, Rfl: 1   hydrOXYzine (VISTARIL) 25 MG capsule, TAKE 25MG S BY MOUTH UP TO TWO TIMES DAILY AS NEEDED FOR ANXIETY (Patient taking differently: Take 25 mg by mouth 2 (two) times daily as  needed for anxiety.), Disp: 180 capsule, Rfl: 0   loratadine (CLARITIN) 10 MG tablet, Take 1 tablet (10 mg total) by mouth daily., Disp: 30 tablet, Rfl: 11   medroxyPROGESTERone (PROVERA) 5 MG tablet, Take one tablet a day x 5 days every 1-2 months if no spontaneous cycles., Disp: 15 tablet, Rfl: 3   sertraline (ZOLOFT) 100 MG tablet, Take 2 tablets (200 mg total) by mouth daily., Disp: 180 tablet, Rfl: 3   spironolactone (ALDACTONE) 50 MG tablet, Take 50 mg by mouth daily., Disp: , Rfl:   Review of Symptoms: Complete 10-system review is negative except as above in Interval History.  Physical Exam: There were no vitals taken for this visit. Deferred given limitations of phone visit  Laboratory & Radiologic Studies: Pelvic MRI 7/5: Lower Urinary Tract: No urinary bladder or urethral abnormality identified.   Bowel: Unremarkable pelvic bowel loops.   Vascular/Lymphatic: Unremarkable. No pathologically enlarged pelvic lymph nodes identified.   Reproductive:   -- Uterus: Measures 9.1 x 5.6 by 6.7 cm (volume = 180 cm^3). A 2.4 cm intramural fibroid is seen in the right lateral fundus. A 3.2 cm pedunculated subserosal fibroid is also seen arising from the right anterior corpus. Heterogeneous myometrial signal intensity with tiny cystic foci are also seen, consistent with adenomyosis. Thin endometrial stripe seen measuring 4 mm. Normal appearance of cervix and vagina.   -- Right ovary: A complex cystic mass is seen in the right adnexa which measures 5.7 x 4.0 x 3.4 cm. This contains both multiple thickened internal septations as well as an enhancing mural nodule measuring approximately 1.9 cm. This is consistent with a cystic ovarian neoplasm, and malignancy cannot be excluded.   -- Left ovary: Appears normal. No ovarian or adnexal masses identified.   Other: No peritoneal thickening or abnormal free fluid.   Musculoskeletal:  Unremarkable.   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 Malignancy cannot be excluded. Surgical evaluation is recommended.   No evidence of pelvic metastatic disease.   Two small uterine fibroids and adenomyosis, as described above.  Assessment & Plan: Kendra Allen is a 25 y.o. woman with a complex adnexal mass.  I reviewed in detail findings from recent MRI, which show some complexity to the known adnexal mass.  Given MRI findings, I recommend proceeding with surgical exploration for definitive diagnosis.  Patient is amenable to moving forward with surgery that we have scheduled next week.  Plan will be for Intra-Op evaluation for safety of ovarian cystectomy versus oophorectomy with goal of avoiding intraoperative rupture of the cyst.  I will plan to send the sister ovary to pathology for frozen  section.  If no malignancy confirmed, as I suspect, then no additional surgery would be indicated.  As long as the fallopian tube is normal in appearance, I will plan to leave bilateral fallopian tubes as well as the contralateral ovary.  If frozen section is concerning for borderline tumor, then I will leave other adnexa as well as the uterus and cervix.  I would biopsy any other lesions that were concerning for possible disease involvement.  If, in the unlikely event, the frozen section confirms malignancy, then I discussed that I would proceed with fertility sparing surgery if indicated.  This may include lymph node sampling, peritoneal biopsies, and omentectomy.  Of the patient's questions as well as her mother's were answered today.  She is already presented for preoperative blood work.  I discussed the assessment and treatment plan with the patient. The patient was provided with an opportunity to ask questions and all were answered. The patient agreed with the plan and demonstrated an understanding of the instructions.   The patient was advised to call back or see an in-person  evaluation if the symptoms worsen or if the condition fails to improve as anticipated.   18 minutes of total time was spent for this patient encounter, including preparation, face-to-face counseling with the patient and coordination of care, and documentation of the encounter.   Jeral Pinch, MD  Division of Gynecologic Oncology  Department of Obstetrics and Gynecology  [facility]

## 2020-12-23 NOTE — H&P (View-Only) (Signed)
Gynecologic Oncology Telehealth Consult Note: Gyn-Onc  I connected with Kendra Allen on 12/23/20 at  3:30 PM EDT by telephone and verified that I am speaking with the correct person using two identifiers.  I discussed the limitations, risks, security and privacy concerns of performing an evaluation and management service by telemedicine and the availability of in-person appointments. I also discussed with the patient that there may be a patient responsible charge related to this service. The patient expressed understanding and agreed to proceed.  Other persons participating in the visit and their role in the encounter: Patient's mother.  Patient's location: Home Provider's location: [facility]  Reason for Visit: Follow-up MRI results  Interval History: Patient reports doing well, no significant changes since her recent visit with me.  Underwent an MRI this week to further characterize adnexal mass.  Past Medical/Surgical History: Past Medical History:  Diagnosis Date   Anemia    Anxiety    Chest pain    Constipation    Depression    Family history of breast cancer    Family history of colon cancer    Family history of ovarian cancer    Family history of prostate cancer    Hidradenitis suppurativa    PCOS (polycystic ovarian syndrome)    Prediabetes    Sleep apnea    Vitamin D deficiency     Past Surgical History:  Procedure Laterality Date   CERVICAL POLYPECTOMY     DILATATION & CURETTAGE/HYSTEROSCOPY WITH MYOSURE N/A 11/21/2016   Procedure: DILATATION & CURETTAGE/HYSTEROSCOPY WITH MYOSURE;  [name], MD;  Location: [facility];  Service: Gynecology;  [name];   WISDOM TOOTH EXTRACTION      Family History  Problem Relation Age of Onset   Hypertension Mother    Depression Mother    Obesity Mother    Sleep apnea Father    Obesity Father    Colon cancer Maternal Uncle 78   Diabetes Paternal Aunt    Heart attack Maternal  Grandmother    Prostate cancer Maternal Grandfather        dx 50s, metastatic   Stroke Paternal Grandfather    Breast cancer Half-Sister 75   Ovarian cancer Maternal Great-grandmother        dx ?, MGF's mother   Breast cancer Other        multiple of mother's paternal cousins   Ovarian cancer Other        multiple of mother's paternal cousins   Prostate cancer Other        multiple of mother's paternal cousins   Endometrial cancer Neg Hx    Pancreatic cancer Neg Hx     Social History   Socioeconomic History   Marital status: Single    Spouse name: Not on file   Number of children: 0   Years of education: Not on file   Highest education level: Not on file  Occupational History   Occupation: school teacher  Tobacco Use   Smoking status: Never   Smokeless tobacco: Never  Vaping Use   Vaping Use: Never used  Substance and Sexual Activity   Alcohol use: Yes    Comment: social   Drug use: Never   Sexual activity: Not Currently  Other Topics Concern   Not on file  Social History Narrative   Not on file   Social Determinants of Health   Financial Resource Strain: Not on file  Food Insecurity: Not on file  Transportation Needs: Not  on file  Physical Activity: Not on file  Stress: Not on file  Social Connections: Not on file    Current Medications:  Current Outpatient Medications:    cephALEXin (KEFLEX) 500 MG capsule, Take 1 capsule (500 mg total) by mouth 4 (four) times daily., Disp: 40 capsule, Rfl: 0   clindamycin (CLEOCIN T) 1 % external solution, Apply 1 application topically as needed (hidradenitis suppurativa flare). , Disp: , Rfl:    ferrous sulfate 325 (65 FE) MG tablet, Take 1 tablet (325 mg total) by mouth daily. (Patient not taking: Reported on 12/15/2020), Disp: 90 tablet, Rfl: 1   hydrOXYzine (VISTARIL) 25 MG capsule, TAKE 25MG S BY MOUTH UP TO TWO TIMES DAILY AS NEEDED FOR ANXIETY (Patient taking differently: Take 25 mg by mouth 2 (two) times daily as  needed for anxiety.), Disp: 180 capsule, Rfl: 0   loratadine (CLARITIN) 10 MG tablet, Take 1 tablet (10 mg total) by mouth daily., Disp: 30 tablet, Rfl: 11   medroxyPROGESTERone (PROVERA) 5 MG tablet, Take one tablet a day x 5 days every 1-2 months if no spontaneous cycles., Disp: 15 tablet, Rfl: 3   sertraline (ZOLOFT) 100 MG tablet, Take 2 tablets (200 mg total) by mouth daily., Disp: 180 tablet, Rfl: 3   spironolactone (ALDACTONE) 50 MG tablet, Take 50 mg by mouth daily., Disp: , Rfl:   Review of Symptoms: Complete 10-system review is negative except as above in Interval History.  Physical Exam: There were no vitals taken for this visit. Deferred given limitations of phone visit  Laboratory & Radiologic Studies: Pelvic MRI 7/5: Lower Urinary Tract: No urinary bladder or urethral abnormality identified.   Bowel: Unremarkable pelvic bowel loops.   Vascular/Lymphatic: Unremarkable. No pathologically enlarged pelvic lymph nodes identified.   Reproductive:   -- Uterus: Measures 9.1 x 5.6 by 6.7 cm (volume = 180 cm^3). A 2.4 cm intramural fibroid is seen in the right lateral fundus. A 3.2 cm pedunculated subserosal fibroid is also seen arising from the right anterior corpus. Heterogeneous myometrial signal intensity with tiny cystic foci are also seen, consistent with adenomyosis. Thin endometrial stripe seen measuring 4 mm. Normal appearance of cervix and vagina.   -- Right ovary: A complex cystic mass is seen in the right adnexa which measures 5.7 x 4.0 x 3.4 cm. This contains both multiple thickened internal septations as well as an enhancing mural nodule measuring approximately 1.9 cm. This is consistent with a cystic ovarian neoplasm, and malignancy cannot be excluded.   -- Left ovary: Appears normal. No ovarian or adnexal masses identified.   Other: No peritoneal thickening or abnormal free fluid.   Musculoskeletal:  Unremarkable.   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 Malignancy cannot be excluded. Surgical evaluation is recommended.   No evidence of pelvic metastatic disease.   Two small uterine fibroids and adenomyosis, as described above.  Assessment & Plan: Kendra Allen is a 25 y.o. woman with a complex adnexal mass.  I reviewed in detail findings from recent MRI, which show some complexity to the known adnexal mass.  Given MRI findings, I recommend proceeding with surgical exploration for definitive diagnosis.  Patient is amenable to moving forward with surgery that we have scheduled next week.  Plan will be for Intra-Op evaluation for safety of ovarian cystectomy versus oophorectomy with goal of avoiding intraoperative rupture of the cyst.  I will plan to send the sister ovary to pathology for frozen  section.  If no malignancy confirmed, as I suspect, then no additional surgery would be indicated.  As long as the fallopian tube is normal in appearance, I will plan to leave bilateral fallopian tubes as well as the contralateral ovary.  If frozen section is concerning for borderline tumor, then I will leave other adnexa as well as the uterus and cervix.  I would biopsy any other lesions that were concerning for possible disease involvement.  If, in the unlikely event, the frozen section confirms malignancy, then I discussed that I would proceed with fertility sparing surgery if indicated.  This may include lymph node sampling, peritoneal biopsies, and omentectomy.  Of the patient's questions as well as her mother's were answered today.  She is already presented for preoperative blood work.  I discussed the assessment and treatment plan with the patient. The patient was provided with an opportunity to ask questions and all were answered. The patient agreed with the plan and demonstrated an understanding of the instructions.   The patient was advised to call back or see an in-person  evaluation if the symptoms worsen or if the condition fails to improve as anticipated.   18 minutes of total time was spent for this patient encounter, including preparation, face-to-face counseling with the patient and coordination of care, and documentation of the encounter.   Jeral Pinch, MD  Division of Gynecologic Oncology  Department of Obstetrics and Gynecology  [facility]

## 2020-12-24 NOTE — Telephone Encounter (Signed)
Tried calling pt mom there was no answer and couldn't leave msg due to vm being full. Will retry later.Marland KitchenJohny Allen

## 2020-12-24 NOTE — Telephone Encounter (Signed)
I looked at doctor's notes, MRI, other tests.  I agree with the plan. Hope surgery goes well! Thanks

## 2020-12-27 ENCOUNTER — Encounter: Payer: Self-pay | Admitting: Gynecologic Oncology

## 2020-12-27 ENCOUNTER — Other Ambulatory Visit: Payer: Self-pay | Admitting: Gynecologic Oncology

## 2020-12-27 DIAGNOSIS — N83201 Unspecified ovarian cyst, right side: Secondary | ICD-10-CM

## 2020-12-27 MED ORDER — SENNOSIDES-DOCUSATE SODIUM 8.6-50 MG PO TABS: 2.0000 | ORAL_TABLET | Freq: Every day | ORAL | 0 refills | Status: DC

## 2020-12-27 MED ORDER — OXYCODONE HCL 5 MG PO TABS: 5.0000 mg | ORAL_TABLET | ORAL | 0 refills | Status: DC | PRN

## 2020-12-27 MED ORDER — IBUPROFEN 800 MG PO TABS: 800.0000 mg | ORAL_TABLET | Freq: Three times a day (TID) | ORAL | 0 refills | Status: DC | PRN

## 2020-12-27 NOTE — Telephone Encounter (Signed)
Notified pt mother w/his response.Marland KitchenJohny Allen

## 2020-12-27 NOTE — Progress Notes (Signed)
Post-op meds prescribed pre-operatively. Medications discussed at last in person visit with Dr. Berline Lopes. A mychart message will be sent to the patient about the risk of upper GI bleeding while taking ibuprofen and zoloft.

## 2020-12-29 ENCOUNTER — Telehealth: Payer: Self-pay

## 2020-12-29 NOTE — Telephone Encounter (Signed)
Kendra Allen states that she understands her written pre-op instructions from 12-21-20. She has no questions or concerns at this time.

## 2020-12-30 ENCOUNTER — Encounter (HOSPITAL_COMMUNITY): Payer: Self-pay | Admitting: Gynecologic Oncology

## 2020-12-30 ENCOUNTER — Ambulatory Visit (HOSPITAL_COMMUNITY): Payer: BC Managed Care – PPO | Admitting: Anesthesiology

## 2020-12-30 ENCOUNTER — Encounter (HOSPITAL_COMMUNITY)
Admission: RE | Disposition: A | Discharge status: Home / Self Care | Payer: Self-pay | Source: Home / Self Care | Attending: Gynecologic Oncology

## 2020-12-30 ENCOUNTER — Ambulatory Visit (HOSPITAL_COMMUNITY)
Admission: RE | Admit: 2020-12-30 | Discharge: 2020-12-30 | Disposition: A | Discharge status: Home / Self Care | Payer: BC Managed Care – PPO | Attending: Gynecologic Oncology | Admitting: Gynecologic Oncology

## 2020-12-30 DIAGNOSIS — Z79899 Other long term (current) drug therapy: Secondary | ICD-10-CM | POA: Diagnosis not present

## 2020-12-30 DIAGNOSIS — N83201 Unspecified ovarian cyst, right side: Secondary | ICD-10-CM

## 2020-12-30 DIAGNOSIS — N801 Endometriosis of ovary: Secondary | ICD-10-CM | POA: Diagnosis not present

## 2020-12-30 DIAGNOSIS — N809 Endometriosis, unspecified: Secondary | ICD-10-CM

## 2020-12-30 DIAGNOSIS — D259 Leiomyoma of uterus, unspecified: Secondary | ICD-10-CM

## 2020-12-30 DIAGNOSIS — D252 Subserosal leiomyoma of uterus: Secondary | ICD-10-CM

## 2020-12-30 HISTORY — PX: ROBOTIC ASSISTED LAPAROSCOPIC OVARIAN CYSTECTOMY: SHX6081

## 2020-12-30 LAB — PREGNANCY, URINE: Preg Test, Ur: NEGATIVE

## 2020-12-30 LAB — ABO/RH: ABO/RH(D): A POS

## 2020-12-30 LAB — TYPE AND SCREEN
ABO/RH(D): A POS
Antibody Screen: NEGATIVE

## 2020-12-30 SURGERY — EXCISION, CYST, OVARY, ROBOT-ASSISTED, LAPAROSCOPIC
Anesthesia: General | Site: Pelvis

## 2020-12-30 MED ORDER — GABAPENTIN 300 MG PO CAPS
300.0000 mg | ORAL_CAPSULE | ORAL | Status: AC
Start: 2020-12-30 — End: 2020-12-30
  Administered 2020-12-30: 300 mg via ORAL
  Filled 2020-12-30: qty 1

## 2020-12-30 MED ORDER — KETOROLAC TROMETHAMINE 30 MG/ML IJ SOLN: 30.0000 mg | Freq: Once | INTRAMUSCULAR | Status: DC | PRN

## 2020-12-30 MED ORDER — BUPIVACAINE HCL 0.25 % IJ SOLN
INTRAMUSCULAR | Status: DC | PRN
  Administered 2020-12-30: 30 mL

## 2020-12-30 MED ORDER — PROMETHAZINE HCL 25 MG/ML IJ SOLN: 6.2500 mg | INTRAMUSCULAR | Status: DC | PRN

## 2020-12-30 MED ORDER — PROPOFOL 10 MG/ML IV BOLUS
INTRAVENOUS | Status: AC
  Filled 2020-12-30: qty 20

## 2020-12-30 MED ORDER — ACETAMINOPHEN 500 MG PO TABS
1000.0000 mg | ORAL_TABLET | Freq: Once | ORAL | Status: AC
  Administered 2020-12-30: 1000 mg via ORAL
  Filled 2020-12-30: qty 2

## 2020-12-30 MED ORDER — LIDOCAINE 2% (20 MG/ML) 5 ML SYRINGE
INTRAMUSCULAR | Status: DC | PRN
  Administered 2020-12-30: 60 mg via INTRAVENOUS

## 2020-12-30 MED ORDER — ACETAMINOPHEN 500 MG PO TABS: 1000.0000 mg | ORAL_TABLET | ORAL | Status: DC

## 2020-12-30 MED ORDER — ONDANSETRON HCL 4 MG/2ML IJ SOLN
INTRAMUSCULAR | Status: DC | PRN
  Administered 2020-12-30: 4 mg via INTRAVENOUS

## 2020-12-30 MED ORDER — LACTATED RINGERS IR SOLN
Status: DC | PRN
  Administered 2020-12-30: 1000 mL

## 2020-12-30 MED ORDER — SCOPOLAMINE 1 MG/3DAYS TD PT72: 1.0000 | MEDICATED_PATCH | TRANSDERMAL | Status: DC

## 2020-12-30 MED ORDER — MEPERIDINE HCL 50 MG/ML IJ SOLN: 6.2500 mg | INTRAMUSCULAR | Status: DC | PRN

## 2020-12-30 MED ORDER — FENTANYL CITRATE (PF) 100 MCG/2ML IJ SOLN
INTRAMUSCULAR | Status: AC
  Filled 2020-12-30: qty 2

## 2020-12-30 MED ORDER — LIDOCAINE 2% (20 MG/ML) 5 ML SYRINGE
INTRAMUSCULAR | Status: AC
  Filled 2020-12-30: qty 5

## 2020-12-30 MED ORDER — ROCURONIUM BROMIDE 10 MG/ML (PF) SYRINGE
PREFILLED_SYRINGE | INTRAVENOUS | Status: DC | PRN
  Administered 2020-12-30: 70 mg via INTRAVENOUS
  Administered 2020-12-30: 20 mg via INTRAVENOUS
  Administered 2020-12-30: 10 mg via INTRAVENOUS

## 2020-12-30 MED ORDER — OXYCODONE HCL 5 MG/5ML PO SOLN: 5.0000 mg | Freq: Once | ORAL | Status: DC | PRN

## 2020-12-30 MED ORDER — FENTANYL CITRATE (PF) 100 MCG/2ML IJ SOLN
INTRAMUSCULAR | Status: DC | PRN
  Administered 2020-12-30 (×2): 100 ug via INTRAVENOUS
  Administered 2020-12-30 (×3): 50 ug via INTRAVENOUS

## 2020-12-30 MED ORDER — BUPIVACAINE HCL (PF) 0.25 % IJ SOLN
INTRAMUSCULAR | Status: AC
  Filled 2020-12-30: qty 30

## 2020-12-30 MED ORDER — KETOROLAC TROMETHAMINE 15 MG/ML IJ SOLN: 15.0000 mg | Freq: Four times a day (QID) | INTRAMUSCULAR | Status: DC

## 2020-12-30 MED ORDER — MIDAZOLAM HCL 5 MG/5ML IJ SOLN
INTRAMUSCULAR | Status: DC | PRN
  Administered 2020-12-30: 2 mg via INTRAVENOUS

## 2020-12-30 MED ORDER — OXYCODONE HCL 5 MG PO TABS: 5.0000 mg | ORAL_TABLET | Freq: Once | ORAL | Status: DC | PRN

## 2020-12-30 MED ORDER — HEMOSTATIC AGENTS (NO CHARGE) OPTIME
TOPICAL | Status: DC | PRN
  Administered 2020-12-30: 1 via TOPICAL

## 2020-12-30 MED ORDER — LACTATED RINGERS IV SOLN: INTRAVENOUS | Status: DC

## 2020-12-30 MED ORDER — KETOROLAC TROMETHAMINE 15 MG/ML IJ SOLN
15.0000 mg | INTRAMUSCULAR | Status: DC
Start: 2020-12-30 — End: 2020-12-30
  Filled 2020-12-30: qty 1

## 2020-12-30 MED ORDER — OXYCODONE HCL 5 MG PO TABS
5.0000 mg | ORAL_TABLET | ORAL | Status: DC | PRN
Start: 2020-12-30 — End: 2020-12-30

## 2020-12-30 MED ORDER — PROPOFOL 10 MG/ML IV BOLUS
INTRAVENOUS | Status: DC | PRN
  Administered 2020-12-30: 200 mg via INTRAVENOUS

## 2020-12-30 MED ORDER — SUGAMMADEX SODIUM 200 MG/2ML IV SOLN
INTRAVENOUS | Status: DC | PRN
  Administered 2020-12-30: 400 mg via INTRAVENOUS

## 2020-12-30 MED ORDER — ACETAMINOPHEN 325 MG PO TABS
650.0000 mg | ORAL_TABLET | ORAL | Status: DC | PRN
Start: 2020-12-30 — End: 2020-12-30

## 2020-12-30 MED ORDER — SODIUM CHLORIDE 0.9% FLUSH
3.0000 mL | Freq: Two times a day (BID) | INTRAVENOUS | Status: DC
Start: 2020-12-30 — End: 2020-12-30

## 2020-12-30 MED ORDER — HEPARIN SODIUM (PORCINE) 5000 UNIT/ML IJ SOLN
5000.0000 [IU] | INTRAMUSCULAR | Status: AC
Start: 2020-12-30 — End: 2020-12-30
  Administered 2020-12-30: 5000 [IU] via SUBCUTANEOUS
  Filled 2020-12-30: qty 1

## 2020-12-30 MED ORDER — SODIUM CHLORIDE 0.9 % IV SOLN
250.0000 mL | INTRAVENOUS | Status: DC | PRN
Start: 2020-12-30 — End: 2020-12-30

## 2020-12-30 MED ORDER — DEXAMETHASONE SODIUM PHOSPHATE 4 MG/ML IJ SOLN
4.0000 mg | INTRAMUSCULAR | Status: AC
Start: 2020-12-30 — End: 2020-12-30
  Administered 2020-12-30: 10 mg via INTRAVENOUS

## 2020-12-30 MED ORDER — FENTANYL CITRATE (PF) 250 MCG/5ML IJ SOLN
INTRAMUSCULAR | Status: AC
  Filled 2020-12-30: qty 5

## 2020-12-30 MED ORDER — CHLORHEXIDINE GLUCONATE 0.12 % MT SOLN
15.0000 mL | Freq: Once | OROMUCOSAL | Status: AC
  Administered 2020-12-30: 15 mL via OROMUCOSAL

## 2020-12-30 MED ORDER — SODIUM CHLORIDE 0.9% FLUSH: 3.0000 mL | INTRAVENOUS | Status: DC | PRN

## 2020-12-30 MED ORDER — ROCURONIUM BROMIDE 10 MG/ML (PF) SYRINGE
PREFILLED_SYRINGE | INTRAVENOUS | Status: AC
  Filled 2020-12-30: qty 10

## 2020-12-30 MED ORDER — MIDAZOLAM HCL 2 MG/2ML IJ SOLN
INTRAMUSCULAR | Status: AC
  Filled 2020-12-30: qty 2

## 2020-12-30 MED ORDER — ACETAMINOPHEN 650 MG RE SUPP
650.0000 mg | RECTAL | Status: DC | PRN
Start: 2020-12-30 — End: 2020-12-30
  Filled 2020-12-30: qty 1

## 2020-12-30 MED ORDER — HYDROMORPHONE HCL 1 MG/ML IJ SOLN: 0.2500 mg | INTRAMUSCULAR | Status: DC | PRN

## 2020-12-30 MED ORDER — SCOPOLAMINE 1 MG/3DAYS TD PT72
1.0000 | MEDICATED_PATCH | TRANSDERMAL | Status: DC
Start: 2020-12-30 — End: 2020-12-30
  Administered 2020-12-30: 1.5 mg via TRANSDERMAL
  Filled 2020-12-30: qty 1

## 2020-12-30 MED ORDER — APREPITANT 40 MG PO CAPS
40.0000 mg | ORAL_CAPSULE | Freq: Once | ORAL | Status: AC
  Administered 2020-12-30: 40 mg via ORAL
  Filled 2020-12-30: qty 1

## 2020-12-30 MED ORDER — STERILE WATER FOR IRRIGATION IR SOLN
Status: DC | PRN
  Administered 2020-12-30: 1000 mL

## 2020-12-30 MED ORDER — MORPHINE SULFATE (PF) 4 MG/ML IV SOLN: 2.0000 mg | INTRAVENOUS | Status: DC | PRN

## 2020-12-30 SURGICAL SUPPLY — 70 items
AGENT HMST KT MTR STRL THRMB (HEMOSTASIS) ×2
APL ESCP 34 STRL LF DISP (HEMOSTASIS) ×1
APPLICATOR SURGIFLO ENDO (HEMOSTASIS) ×2 IMPLANT
BACTOSHIELD CHG 4% 4OZ (MISCELLANEOUS) ×1
BAG COUNTER SPONGE SURGICOUNT (BAG) IMPLANT
BAG LAPAROSCOPIC 12 15 PORT 16 (BASKET) ×3 IMPLANT
BAG RETRIEVAL 12/15 (BASKET) ×6
BAG SPEC RTRVL LRG 6X4 10 (ENDOMECHANICALS)
BAG SPNG CNTER NS LX DISP (BAG)
BLADE SURG SZ10 CARB STEEL (BLADE) IMPLANT
COVER BACK TABLE 60X90IN (DRAPES) ×2 IMPLANT
COVER TIP SHEARS 8 DVNC (MISCELLANEOUS) ×1 IMPLANT
COVER TIP SHEARS 8MM DA VINCI (MISCELLANEOUS) ×2
DECANTER SPIKE VIAL GLASS SM (MISCELLANEOUS) IMPLANT
DERMABOND ADVANCED (GAUZE/BANDAGES/DRESSINGS) ×1
DERMABOND ADVANCED .7 DNX12 (GAUZE/BANDAGES/DRESSINGS) ×1 IMPLANT
DRAPE ARM DVNC X/XI (DISPOSABLE) ×4 IMPLANT
DRAPE COLUMN DVNC XI (DISPOSABLE) ×1 IMPLANT
DRAPE DA VINCI XI ARM (DISPOSABLE) ×8
DRAPE DA VINCI XI COLUMN (DISPOSABLE) ×2
DRAPE SHEET LG 3/4 BI-LAMINATE (DRAPES) ×2 IMPLANT
DRAPE SURG IRRIG POUCH 19X23 (DRAPES) ×2 IMPLANT
DRSG OPSITE POSTOP 4X6 (GAUZE/BANDAGES/DRESSINGS) IMPLANT
DRSG OPSITE POSTOP 4X8 (GAUZE/BANDAGES/DRESSINGS) IMPLANT
ELECT PENCIL ROCKER SW 15FT (MISCELLANEOUS) IMPLANT
ELECT REM PT RETURN 15FT ADLT (MISCELLANEOUS) ×2 IMPLANT
GLOVE SURG ENC MOIS LTX SZ6 (GLOVE) ×8 IMPLANT
GLOVE SURG ENC MOIS LTX SZ6.5 (GLOVE) ×4 IMPLANT
GOWN STRL REUS W/ TWL LRG LVL3 (GOWN DISPOSABLE) ×4 IMPLANT
GOWN STRL REUS W/TWL LRG LVL3 (GOWN DISPOSABLE) ×8
HOLDER FOLEY CATH W/STRAP (MISCELLANEOUS) ×2 IMPLANT
IRRIG SUCT STRYKERFLOW 2 WTIP (MISCELLANEOUS) ×2
IRRIGATION SUCT STRKRFLW 2 WTP (MISCELLANEOUS) ×1 IMPLANT
KIT PROCEDURE DA VINCI SI (MISCELLANEOUS)
KIT PROCEDURE DVNC SI (MISCELLANEOUS) IMPLANT
KIT TURNOVER KIT A (KITS) ×2 IMPLANT
MANIPULATOR UTERINE 4.5 ZUMI (MISCELLANEOUS) ×2 IMPLANT
NEEDLE HYPO 21X1.5 SAFETY (NEEDLE) ×2 IMPLANT
NEEDLE SPNL 18GX3.5 QUINCKE PK (NEEDLE) IMPLANT
OBTURATOR OPTICAL STANDARD 8MM (TROCAR) ×2
OBTURATOR OPTICAL STND 8 DVNC (TROCAR) ×1
OBTURATOR OPTICALSTD 8 DVNC (TROCAR) ×1 IMPLANT
PACK ROBOT GYN CUSTOM WL (TRAY / TRAY PROCEDURE) ×2 IMPLANT
PAD POSITIONING PINK XL (MISCELLANEOUS) ×2 IMPLANT
PORT ACCESS TROCAR AIRSEAL 12 (TROCAR) ×1 IMPLANT
PORT ACCESS TROCAR AIRSEAL 5M (TROCAR) ×1
POUCH SPECIMEN RETRIEVAL 10MM (ENDOMECHANICALS) IMPLANT
SCRUB CHG 4% DYNA-HEX 4OZ (MISCELLANEOUS) ×1 IMPLANT
SEAL CANN UNIV 5-8 DVNC XI (MISCELLANEOUS) ×4 IMPLANT
SEAL XI 5MM-8MM UNIVERSAL (MISCELLANEOUS) ×8
SET TRI-LUMEN FLTR TB AIRSEAL (TUBING) ×2 IMPLANT
SPONGE T-LAP 18X18 ~~LOC~~+RFID (SPONGE) IMPLANT
SURGIFLO W/THROMBIN 8M KIT (HEMOSTASIS) ×4 IMPLANT
SUT MNCRL AB 4-0 PS2 18 (SUTURE) IMPLANT
SUT PDS AB 1 TP1 96 (SUTURE) IMPLANT
SUT VIC AB 0 CT1 27 (SUTURE)
SUT VIC AB 0 CT1 27XBRD ANTBC (SUTURE) IMPLANT
SUT VIC AB 2-0 CT1 27 (SUTURE)
SUT VIC AB 2-0 CT1 TAPERPNT 27 (SUTURE) IMPLANT
SUT VIC AB 2-0 SH 27 (SUTURE) ×2
SUT VIC AB 2-0 SH 27X BRD (SUTURE) ×1 IMPLANT
SUT VIC AB 4-0 PS2 18 (SUTURE) ×4 IMPLANT
SYR 10ML LL (SYRINGE) IMPLANT
TOWEL OR NON WOVEN STRL DISP B (DISPOSABLE) ×2 IMPLANT
TRAP SPECIMEN MUCUS 40CC (MISCELLANEOUS) IMPLANT
TRAY FOLEY MTR SLVR 16FR STAT (SET/KITS/TRAYS/PACK) ×2 IMPLANT
TROCAR XCEL NON-BLD 5MMX100MML (ENDOMECHANICALS) IMPLANT
UNDERPAD 30X36 HEAVY ABSORB (UNDERPADS AND DIAPERS) ×2 IMPLANT
WATER STERILE IRR 1000ML POUR (IV SOLUTION) ×2 IMPLANT
YANKAUER SUCT BULB TIP 10FT TU (MISCELLANEOUS) IMPLANT

## 2020-12-30 NOTE — Anesthesia Postprocedure Evaluation (Signed)
Anesthesia Post Note  Patient: Kendra Allen  Procedure(s) Performed: XI ROBOTIC ASSISTED LAPAROSCOPIC RIGHT SALPINGO-OOPHORECTOMY WITH PELVIC WASHINGS (Pelvis)     Patient location during evaluation: PACU Anesthesia Type: General Level of consciousness: awake and alert Pain management: pain level controlled Vital Signs Assessment: post-procedure vital signs reviewed and stable Respiratory status: spontaneous breathing, nonlabored ventilation, respiratory function stable and patient connected to nasal cannula oxygen Cardiovascular status: blood pressure returned to baseline and stable Postop Assessment: no apparent nausea or vomiting Anesthetic complications: no   No notable events documented.  Last Vitals:  Vitals:   12/30/20 1315 12/30/20 1330  BP: (!) 155/100   Pulse: 81   Resp: 18   Temp:  (!) 36.4 C  SpO2: 97%     Last Pain:  Vitals:   12/30/20 1330  TempSrc:   PainSc: 0-No pain                 Barnet Glasgow

## 2020-12-30 NOTE — Anesthesia Preprocedure Evaluation (Addendum)
Anesthesia Evaluation  Patient identified by MRN, date of birth, ID band Patient awake    Reviewed: Allergy & Precautions, NPO status , Patient's Chart, lab work & pertinent test results  Airway Mallampati: III  TM Distance: <3 FB Neck ROM: Full    Dental no notable dental hx. (+) Teeth Intact   Pulmonary sleep apnea ,    Pulmonary exam normal breath sounds clear to auscultation       Cardiovascular negative cardio ROS Normal cardiovascular exam Rhythm:Regular Rate:Normal     Neuro/Psych  Headaches, PSYCHIATRIC DISORDERS Anxiety Depression    GI/Hepatic negative GI ROS, Neg liver ROS,   Endo/Other  diabetes (pre-diabetic)Morbid obesityBMI 45  Renal/GU negative Renal ROS  Female GU complaint (complex adnexal mass)     Musculoskeletal negative musculoskeletal ROS (+)   Abdominal (+) + obese,   Peds  Hematology  (+) Blood dyscrasia, anemia , hct 36.7, plt 338   Anesthesia Other Findings   Reproductive/Obstetrics negative OB ROS                            Anesthesia Physical Anesthesia Plan  ASA: 3  Anesthesia Plan: General   Post-op Pain Management:    Induction: Intravenous  PONV Risk Score and Plan: 4 or greater and Ondansetron, Aprepitant, Dexamethasone, Midazolam, Scopolamine patch - Pre-op and Treatment may vary due to age or medical condition  Airway Management Planned: Oral ETT and Video Laryngoscope Planned  Additional Equipment: None  Intra-op Plan:   Post-operative Plan: Extubation in OR  Informed Consent: I have reviewed the patients History and Physical, chart, labs and discussed the procedure including the risks, benefits and alternatives for the proposed anesthesia with the patient or authorized representative who has indicated his/her understanding and acceptance.     Dental advisory given  Plan Discussed with:   Anesthesia Plan Comments:        Anesthesia Quick Evaluation

## 2020-12-30 NOTE — Op Note (Signed)
OPERATIVE NOTE  Pre-operative Diagnosis: Complex adnexal mass  Post-operative Diagnosis: same, stage IV endometriosis with bilateral endometriomas, right pedunculated uterine fibroid  Operation: Robotic-assisted laparoscopic right salpingoophorectomy, myomectomy  Surgeon: Jeral Pinch MD  Assistant Surgeon: Lahoma Crocker MD (an MD assistant was necessary for tissue manipulation, management of robotic instrumentation, retraction and positioning due to the complexity of the case and hospital policies).   Anesthesia: GET  Urine Output: 100cc, concentrated  Operative Findings: On EUA, minimally mobile uterus with fullness in the cul de sac and tethering of rectum to uterus or ovarian mass. On intra-abdominal entry, normal upper abdominal survey. Normal appearing omentum, small and large bowel. Uterus 8-10cm with 3cm pedunculated right fundal myoma, 2cm intramural anterior and fundal myoma. Bilateral ovaries replaced with cysts, 6cm on right 3-4cm on left. Markedly elongated tube on the right with distortion of fimbriae. Left fimbriae normal appearing. Bilateral ovaries tethered to posterior uterus, colon, and bilateral sidewalls. Chocolate fluid on incidental cyst entry during manipulation of the right ovary. Endometriosis along the superior distal portion of the right ureter along the medial aspect of the broad ligament. Posterior cul de sac completely obliterated. No intra-abdominal or pelvic evidence of disease.   Estimated Blood Loss:  less than 100 mL      Total IV Fluids: see I&O flowsheet         Specimens: pelvic washings, right tube and ovary         Complications:  None apparent; patient tolerated the procedure well.         Disposition: PACU - hemodynamically stable.  Procedure Details  The patient was seen in the Holding Room. The risks, benefits, complications, treatment options, and expected outcomes were discussed with the patient.  The patient concurred with the  proposed plan, giving informed consent.  The site of surgery properly noted/marked. The patient was identified as Kendra Allen and the procedure verified as a Robotic-assisted unilateral cystectomy vs oophorectomy, possible staging.   After induction of anesthesia, the patient was draped and prepped in the usual sterile manner. Patient was placed in supine position after anesthesia and draped and prepped in the usual sterile manner as follows: Her arms were tucked to her side with all appropriate precautions.  The shoulders were stabilized with padded shoulder blocks applied to the acromium processes.  The patient was placed in the semi-lithotomy position in [place].  The perineum and vagina were prepped with CholoraPrep. The patient was draped after the CholoraPrep had been allowed to dry for 3 minutes.  A Time Out was held and the above information confirmed.  The urethra was prepped with Betadine. Foley catheter was placed.  A sterile speculum was placed in the vagina.  The cervix was grasped with a single-tooth tenaculum. The cervix was dilated with Kennon Rounds dilators.  The ZUMI uterine manipulator was placed without difficulty.  OG tube placement was confirmed and to suction.   Next, a 10 mm skin incision was made 1 cm below the subcostal margin in the midclavicular line.  The 5 mm Optiview port and scope was used for direct entry.  Opening pressure was under 10 mm CO2.  The abdomen was insufflated and the findings were noted as above.   At this point and all points during the procedure, the patient's intra-abdominal pressure did not exceed 15 mmHg. Next, an 8 mm skin incision was made superior to the umbilicus and a right and left port were placed about 8 cm lateral to the robot port on  the right and left side.  A fourth arm was placed on the right.  The 5 mm assist trocar was exchanged for a 10-12 mm port. All ports were placed under direct visualization.  The patient was placed in steep  Trendelenburg.  Bowel was folded away into the upper abdomen.  The robot was docked in the normal manner.  Pelvic washings were obtained and pictures taken of the pelvis.  Attention was first turned to the right ovary.  The ovary was elevated so that inspection of the inferior aspect of the adnexa could be performed.  This was adherent to the sidewall and posterior uterus.  Given findings of endometriosis, an attempt at an ovarian cystectomy was going to be performed.  During initial manipulation of the right ovary, there was a small incidental and unavoidable hole that was made on the medial aspect of the ovary near the uterus.  There was drainage of a small amount of chocolate brown fluid as well as some tissue.  This tissue was placed in an Endo Catch bag and removed to be sent for frozen section.  At this time, decision was made to proceed with right salpingo-oophorectomy.  The right peritoneum were opened parallel to the IP ligament to open the retroperitoneal spaces bilaterally. The ureter was noted to be on the medial leaf of the broad ligament.  The peritoneum above the ureter was incised and stretched and the infundibulopelvic ligament was skeletonized, cauterized and cut.  The distal end of the IP ligament was elevated and accommodation of blunt dissection, sharp dissection, and short bursts of electrocautery was used to mobilize the right adnexa from the medial leaf of the broad ligament and the adventitial tissue, maintaining the ureter in view inferiorly.  The ureter was noted to be involved superiorly by cystic lesions consistent with endometriosis.  Inferior aspect of the ovary was sharply dissected free from the uterus and medial leaf of the broad ligament.  Bipolar electrocautery was used to cauterize and transect the fallopian tube and utero-ovarian ligament just lateral to the uterus, ultimately freeing the right adnexa.  Right adnexa was placed in Endo Catch bag and removed through the assist  trocar and sent for frozen section.  The initial frozen section returned showing endometrial tissue.  The ovary and tube revealed a benign ovarian cyst, suspected endometrioma.  No evidence of borderline tumor or malignancy.  Once hemostasis of the bed was noted, attention was turned to the right superior aspect of the uterus where a pedunculated fibroid was noted.  The base of this fibroid was cauterized with bipolar electrocautery and then cut, freeing the fibroid.  This was placed in an Endo Catch bag for later removal.  Additional pictures were taken for documentation of the pelvis at the end of surgery.  Irrigation was used and excellent hemostasis was achieved.  Given raw posterior uterus on the right from dissection, surgiflo was placed within the operative bed. At this point in the procedure was completed.  Robotic instruments were removed under direct visulaization.  The robot was undocked. The RUQ abdominal incision was opened another 1-2cm with the scalpel and this incision was taken down to the fascia which was extended 2cm medially. The muscle was bluntly stretched to allow for delivery of the endocatch bag. The fascia at the 10-12 mm port was closed with 0 Vicryl on a UR-5 needle in running manner. The subcutaneous tissue was irrigated and the subcutaneous tissue was closed with 2-0 Vicryl.  The subcuticular tissue of  all incisions was closed with 4-0 Vicryl and the skin was closed with 4-0 Monocryl in a subcuticular manner.  Dermabond was applied.    The vagina was swabbed after manipulator was removed with minimal bleeding noted.   Foley catheter was removed. All sponge, lap and needle counts were correct x  3.   The patient was transferred to the recovery room in stable condition.  Jeral Pinch, MD

## 2020-12-30 NOTE — Anesthesia Procedure Notes (Signed)
Procedure Name: Intubation Date/Time: 12/30/2020 10:20 AM Performed by: Claudia Desanctis, CRNA Pre-anesthesia Checklist: Patient identified, Emergency Drugs available, Suction available and Patient being monitored Patient Re-evaluated:Patient Re-evaluated prior to induction Oxygen Delivery Method: Circle system utilized Preoxygenation: Pre-oxygenation with 100% oxygen Induction Type: IV induction Ventilation: Mask ventilation without difficulty Laryngoscope Size: 2 and Miller Grade View: Grade I Tube type: Oral Tube size: 7.0 mm Number of attempts: 1 Airway Equipment and Method: Stylet Placement Confirmation: ETT inserted through vocal cords under direct vision, positive ETCO2 and breath sounds checked- equal and bilateral Secured at: 22 cm Tube secured with: Tape Dental Injury: Teeth and Oropharynx as per pre-operative assessment

## 2020-12-30 NOTE — Interval H&P Note (Signed)
History and Physical Interval Note:  12/30/2020 9:21 AM  Kendra Allen  has presented today for surgery, with the diagnosis of COMPLEX ADNEXAL MASS.  The various methods of treatment have been discussed with the patient and family. After consideration of risks, benefits and other options for treatment, the patient has consented to  Procedure(s): XI ROBOTIC Normal, POSSIBLE UNILATERAL OOPHORECTOMY, POSSIBLE STAGING (N/A) as a surgical intervention.  The patient's history has been reviewed, patient examined, no change in status, stable for surgery.  I have reviewed the patient's chart and labs.  Questions were answered to the patient's satisfaction.     Lafonda Mosses

## 2020-12-30 NOTE — Transfer of Care (Signed)
Immediate Anesthesia Transfer of Care Note  Patient: Kendra Allen  Procedure(s) Performed: XI ROBOTIC ASSISTED LAPAROSCOPIC RIGHT SALPINGO-OOPHORECTOMY WITH PELVIC WASHINGS (Pelvis)  Patient Location: PACU  Anesthesia Type:General  Level of Consciousness: drowsy  Airway & Oxygen Therapy: Patient Spontanous Breathing and Patient connected to face mask  Post-op Assessment: Report given to RN and Post -op Vital signs reviewed and stable  Post vital signs: Reviewed and stable  Last Vitals:  Vitals Value Taken Time  BP 129/74 12/30/20 1230  Temp    Pulse 76 12/30/20 1238  Resp 22 12/30/20 1238  SpO2 93 % 12/30/20 1238  Vitals shown include unvalidated device data.  Last Pain:  Vitals:   12/30/20 0846  TempSrc:   PainSc: 0-No pain         Complications: No notable events documented.

## 2020-12-30 NOTE — Discharge Instructions (Signed)
12/30/2020  Return to work: 2-4 weeks  Activity: 1. Be up and out of the bed during the day.  Take a nap if needed.  You may walk up steps but be careful and use the hand rail.  Stair climbing will tire you more than you think, you may need to stop part way and rest.   2. No lifting or straining for 6 weeks.  3. No driving until off narcotics and you feel that you can brake safely. For most people, this is 5-10 days. Do Not drive if you are taking narcotic pain medicine.  4. Shower daily.  Use soap and water on your incision and pat dry; don't rub.   5. No sexual activity and nothing in the vagina for 4-6 weeks.  Medications:  - Take ibuprofen and tylenol first line for pain control. Take these regularly (every 6 hours) to decrease the build up of pain.  - If necessary, for severe pain not relieved by ibuprofen, take oxycodone.  - While taking oxycodone you should take sennakot every night to reduce the likelihood of constipation. If this causes diarrhea, stop its use.  Diet: 1. Low sodium Heart Healthy Diet is recommended.  2. It is safe to use a laxative if you have difficulty moving your bowels.   Wound Care: 1. Keep clean and dry.  Shower daily.  Reasons to call the Doctor:  Fever - Oral temperature greater than 100.4 degrees Fahrenheit Foul-smelling vaginal discharge Difficulty urinating Nausea and vomiting Increased pain at the site of the incision that is unrelieved with pain medicine. Difficulty breathing with or without chest pain New calf pain especially if only on one side Sudden, continuing increased vaginal bleeding with or without clots.   Follow-up: 1. See Jeral Pinch in 3 weeks as previously scheduled.  Contacts: For questions or concerns you should contact:  Dr. Jeral Pinch at [phone] After hours and on week-ends call [phone] and ask to speak to the physician on call for Gynecologic Oncology

## 2020-12-31 ENCOUNTER — Telehealth: Payer: Self-pay

## 2020-12-31 ENCOUNTER — Encounter (HOSPITAL_COMMUNITY): Payer: Self-pay | Admitting: Gynecologic Oncology

## 2020-12-31 LAB — CYTOLOGY - NON PAP

## 2020-12-31 LAB — SURGICAL PATHOLOGY

## 2020-12-31 NOTE — Telephone Encounter (Signed)
Spoke with Ms. Ganci this morning. She states she is eating, drinking and urinating well. She has not had a BM yet but is passing gas. She is taking senokot as prescribed and encouraged her to drink plenty of water. Instructed her to increase the senokot-s to 2 tabs bid until the first BM then adjust dosing according to BM. She denies fever or chills. Incisions are dry and intact. Her pain is controlled with Ibuprofen ad tylenol.  Instructed to call office with any fever, chills, purulent drainage, uncontrolled pain or any other questions or concerns. Patient verbalizes understanding.  Pt aware of post op appointments as well as the office number [phone] and after hours number [phone] to call if she has any questions or concerns

## 2021-01-21 ENCOUNTER — Encounter: Payer: BC Managed Care – PPO | Admitting: Gynecologic Oncology

## 2021-01-21 ENCOUNTER — Telehealth: Payer: Self-pay | Admitting: Internal Medicine

## 2021-01-21 NOTE — Telephone Encounter (Signed)
Covid positive today Symptoms: stuffy nose, sore throat, fatigue  Both mom and daughter tested positive  Told Mom Anaissa Mcfield) that through my chart cone has a virtual appointment options for care and are available until 8pm

## 2021-01-22 ENCOUNTER — Encounter: Payer: Self-pay | Admitting: Nurse Practitioner

## 2021-01-22 ENCOUNTER — Telehealth: Payer: BC Managed Care – PPO | Admitting: Nurse Practitioner

## 2021-01-22 DIAGNOSIS — U071 COVID-19: Secondary | ICD-10-CM | POA: Diagnosis not present

## 2021-01-22 MED ORDER — NIRMATRELVIR/RITONAVIR (PAXLOVID)TABLET: 3.0000 | ORAL_TABLET | Freq: Two times a day (BID) | ORAL | 0 refills | Status: AC

## 2021-01-22 NOTE — Progress Notes (Signed)
Virtual Visit Consent   Kendra Allen, you are scheduled for a virtual visit with a [facility] provider today.     Just as with appointments in the office, your consent must be obtained to participate.  Your consent will be active for this visit and any virtual visit you may have with one of our providers in the next 365 days.     If you have a MyChart account, a copy of this consent can be sent to you electronically.  All virtual visits are billed to your insurance company just like a traditional visit in the office.    As this is a virtual visit, video technology does not allow for your provider to perform a traditional examination.  This may limit your provider's ability to fully assess your condition.  If your provider identifies any concerns that need to be evaluated in person or the need to arrange testing (such as labs, EKG, etc.), we will make arrangements to do so.     Although advances in technology are sophisticated, we cannot ensure that it will always work on either your end or our end.  If the connection with a video visit is poor, the visit may have to be switched to a telephone visit.  With either a video or telephone visit, we are not always able to ensure that we have a secure connection.     I need to obtain your verbal consent now.   Are you willing to proceed with your visit today?    Kendra Allen has provided verbal consent on 01/22/2021 for a virtual visit (video or telephone).   Apolonio Schneiders, FNP   Date: 01/22/2021 1:35 PM   Virtual Visit via Video Note   I, Apolonio Schneiders, connected with  Kendra Allen  ([id number], 1995-08-14) on 01/22/21 at  1:30 PM EDT by a video-enabled telemedicine application and verified that I am speaking with the correct person using two identifiers.  Location: Patient: Virtual Visit Location Patient: Home Provider: Virtual Visit Location Provider: Office/Clinic   I discussed the limitations of evaluation and management by  telemedicine and the availability of in person appointments. The patient expressed understanding and agreed to proceed.    History of Present Illness: Kendra Allen is a 25 y.o. who identifies as a female who was assigned female at birth, and is being seen today after testing positive for COVID-19.   She took a home test yesterday and tested positive for COVID She started to have some symptoms on 01/20/2021 of sinus congestion Yesterday she had increased nasal congestion and a sore throat.  Last night she had chills and possible fever.   Today she has ears are congested.  She has had body aches as well.  She feels similar to when she has had the flu in the past.  She is fatigued as well.   She has not had COVID in the past.  She has been vaccinated x3 for COVID so far  She denies a history of asthma Has not needed an inhaler in the past.   She had an ovary removed two weeks ago due to a cyst.    Problems:  Patient Active Problem List   Diagnosis Date Noted   Endometriosis    Subserous leiomyoma of uterus    Anovulatory (dysfunctional uterine) bleeding 12/14/2020   Cyst of right ovary 12/14/2020   Cough 09/28/2020   Genetic testing 07/02/2020   Family history of breast cancer    Family  history of colon cancer    Family history of prostate cancer    Family history of ovarian cancer    Prediabetes    Well adult exam 04/12/2020   Snorings 04/12/2020   PCOS (polycystic ovarian syndrome) 12/04/2019   Vitamin D deficiency 03/31/2019   Hyperglycemia 03/31/2019   Anemia 01/21/2018   Midsternal chest pain 11/26/2015   Pain in joint, ankle and foot 03/30/2015   Apathy 03/26/2014   Headache 03/26/2014   PMS (premenstrual syndrome) 01/26/2014   Anxiety and depression 01/26/2014   Fever and chills 09/10/2013   Fatigue 09/10/2013   Anxiety attack 06/09/2013   Unspecified constipation 04/02/2013   Diarrhea 04/02/2013   Allergic rhinitis 02/01/2012   Otitis media 12/12/2011    Obesity 03/30/2010   Hidradenitis 12/02/2009    Allergies:  Allergies  Allergen Reactions   Doxycycline     Upset stomach   Sulfamethoxazole-Trimethoprim     upset stomach   Current Outpatient Medications  Medication Instructions   clindamycin (CLEOCIN T) 1 % external solution 1 application, Topical, As needed   hydrOXYzine (VISTARIL) 25 MG capsule TAKE '25MG'$ S BY MOUTH UP TO TWO TIMES DAILY AS NEEDED FOR ANXIETY   ibuprofen (ADVIL) 800 mg, Oral, Every 8 hours PRN, For AFTER surgery only   sertraline (ZOLOFT) 200 mg, Oral, Daily   spironolactone (ALDACTONE) 50 mg, Oral, Daily     Observations/Objective: Patient is well-developed, well-nourished in no acute distress.  Resting comfortably at home.  Head is normocephalic, atraumatic.  No labored breathing.  Speech is clear and coherent with logical content.  Patient is alert and oriented at baseline.    Assessment and Plan: 1. COVID-19  - nirmatrelvir/ritonavir EUA (PAXLOVID) TABS; Take 3 tablets by mouth 2 (two) times daily for 5 days. (Take nirmatrelvir 150 mg two tablets twice daily for 5 days and ritonavir 100 mg one tablet twice daily for 5 days) Patient GFR is >60  Dispense: 30 tablet; Refill: 0     Continue to manage symptoms with OTC medications, discussed isolation time with patient and SE from antiviral will send literature to patient and she will follow up as needed.    Follow Up Instructions: I discussed the assessment and treatment plan with the patient. The patient was provided an opportunity to ask questions and all were answered. The patient agreed with the plan and demonstrated an understanding of the instructions.  A copy of instructions were sent to the patient via MyChart.  The patient was advised to call back or seek an in-person evaluation if the symptoms worsen or if the condition fails to improve as anticipated.  Time:  I spent 15 minutes with the patient via telehealth technology discussing the above  problems/concerns.    Apolonio Schneiders, FNP

## 2021-01-22 NOTE — Patient Instructions (Signed)
You are being prescribed PAXLOVID for COVID-19 infection.    ADMINISTRATION INSTRUCTIONS: Take with or without food. Swallow the tablets whole. Don't chew, crush, or break the medications because it might not work as well  For each dose of the medication, you should be taking 3 tablets together TWICE a day for FIVE days   Finish your full five-day course of Paxlovid even if you feel better before you're done. Stopping this medication too early can make it less effective to prevent severe illness related to [place].    Paxlovid is prescribed for YOU ONLY. Don't share it with others, even if they have similar symptoms as you. This medication might not be right for everyone.  Make sure to take steps to protect yourself and others while you're taking this medication in order to get well soon and to prevent others from getting sick with COVID-19.  Paxlovid (nirmatrelvir / ritonavir) can cause hormonal birth control medications to not work well. If you or your partner is currently taking hormonal birth control, use condoms or other birth control methods to prevent unintended pregnancies.    COMMON SIDE EFFECTS: Altered or bad taste in your mouth  Diarrhea  High blood pressure (1% of people) Muscle aches (1% of people)     If your COVID-19 symptoms get worse, get medical help right away. Call 911 if you experience symptoms such as worsening cough, trouble breathing, chest pain that doesn't go away, confusion, a hard time staying awake, and pale or blue-colored skin. This medication won't prevent all COVID-19 cases from getting worse.

## 2021-02-02 ENCOUNTER — Inpatient Hospital Stay: Payer: BC Managed Care – PPO | Attending: Gynecologic Oncology | Admitting: Gynecologic Oncology

## 2021-02-02 ENCOUNTER — Other Ambulatory Visit: Payer: Self-pay

## 2021-02-02 ENCOUNTER — Encounter: Payer: Self-pay | Admitting: Gynecologic Oncology

## 2021-02-02 VITALS — BP 123/68 | HR 73 | Temp 97.9°F | Resp 18 | Ht 66.0 in | Wt 278.6 lb

## 2021-02-02 DIAGNOSIS — N809 Endometriosis, unspecified: Secondary | ICD-10-CM

## 2021-02-02 DIAGNOSIS — Z90721 Acquired absence of ovaries, unilateral: Secondary | ICD-10-CM

## 2021-02-02 DIAGNOSIS — D252 Subserosal leiomyoma of uterus: Secondary | ICD-10-CM

## 2021-02-02 DIAGNOSIS — N83201 Unspecified ovarian cyst, right side: Secondary | ICD-10-CM

## 2021-02-02 NOTE — Patient Instructions (Signed)
You are healing very well from surgery!  Remember, lifting, pulling, and pushing restrictions are in place until 6 weeks after surgery.  I will send a message to Dr. Talbert Nan today, but given findings at the time of surgery with your endometriosis, I strongly recommend you be on some hormonal management for treatment of your endometriosis.  Please do not hesitate to contact my office if you need anything in the future.

## 2021-02-02 NOTE — Progress Notes (Signed)
Gynecologic Oncology Return Clinic Visit  02/02/2021  Reason for Visit: Follow-up after surgery  Treatment History: On 12/30/2020, patient underwent robotic assisted laparoscopic right salpingo-oophorectomy as well as myomectomy of a pedunculated fibroid with findings of stage IV endometriosis with what appeared to be bilateral endometriomas although normal-appearing left ovary otherwise (only 3-4 cm in size).  Findings at the time of surgery were notable for complete obliteration of the posterior cul-de-sac, endometriosis noted along the superior distal portion of the right ureter and the medial aspect of the broad ligament.  Interval History: Patient reports healing well from surgery.  She denies any abdominal or pelvic pain.  She reports normal bowel and bladder function.  She is tolerating a regular diet without nausea or emesis.  Her menstrual cycle started last Wednesday and she is still bleeding a little bit today.  This is the first cycle she has had since February.  Past Medical/Surgical History: Past Medical History:  Diagnosis Date   Anemia    Anxiety    Chest pain    Constipation    Depression    Family history of breast cancer    Family history of colon cancer    Family history of ovarian cancer    Family history of prostate cancer    Hidradenitis suppurativa    PCOS (polycystic ovarian syndrome)    Prediabetes    Sleep apnea    Vitamin D deficiency     Past Surgical History:  Procedure Laterality Date   CERVICAL POLYPECTOMY     DILATATION & CURETTAGE/HYSTEROSCOPY WITH MYOSURE N/A 11/21/2016   Procedure: DILATATION & CURETTAGE/HYSTEROSCOPY WITH MYOSURE;  [name], MD;  Location: [facility];  Service: Gynecology;  [name];   ROBOTIC ASSISTED LAPAROSCOPIC OVARIAN CYSTECTOMY N/A 12/30/2020   Procedure: XI ROBOTIC ASSISTED LAPAROSCOPIC RIGHT SALPINGO-OOPHORECTOMY WITH PELVIC WASHINGS;  [name], MD;  [name];  Service:  Gynecology;  [name];   WISDOM TOOTH EXTRACTION      Family History  Problem Relation Age of Onset   Hypertension Mother    Depression Mother    Obesity Mother    Sleep apnea Father    Obesity Father    Colon cancer Maternal Uncle 100   Diabetes Paternal Aunt    Heart attack Maternal Grandmother    Prostate cancer Maternal Grandfather        dx 56s, metastatic   Stroke Paternal Grandfather    Breast cancer Half-Sister 28   Ovarian cancer Maternal Great-grandmother        dx ?, MGF's mother   Breast cancer Other        multiple of mother's paternal cousins   Ovarian cancer Other        multiple of mother's paternal cousins   Prostate cancer Other        multiple of mother's paternal cousins   Endometrial cancer Neg Hx    Pancreatic cancer Neg Hx     Social History   Socioeconomic History   Marital status: Single    Spouse name: Not on file   Number of children: 0   Years of education: Not on file   Highest education level: Not on file  Occupational History   Occupation: school teacher  Tobacco Use   Smoking status: Never   Smokeless tobacco: Never  Vaping Use   Vaping Use: Never used  Substance and Sexual Activity   Alcohol use: Yes    Comment: social   Drug use: Never  Sexual activity: Not Currently  Other Topics Concern   Not on file  Social History Narrative   Not on file   Social Determinants of Health   Financial Resource Strain: Not on file  Food Insecurity: Not on file  Transportation Needs: Not on file  Physical Activity: Not on file  Stress: Not on file  Social Connections: Not on file    Current Medications:  Current Outpatient Medications:    clindamycin (CLEOCIN T) 1 % external solution, Apply 1 application topically as needed (hidradenitis suppurativa flare). , Disp: , Rfl:    hydrOXYzine (VISTARIL) 25 MG capsule, TAKE '25MG'$ S BY MOUTH UP TO TWO TIMES DAILY AS NEEDED FOR ANXIETY (Patient taking differently: Take 25 mg by mouth 2  (two) times daily as needed for anxiety.), Disp: 180 capsule, Rfl: 0   ibuprofen (ADVIL) 800 MG tablet, Take 1 tablet (800 mg total) by mouth every 8 (eight) hours as needed for moderate pain. For AFTER surgery only, Disp: 30 tablet, Rfl: 0   sertraline (ZOLOFT) 100 MG tablet, Take 2 tablets (200 mg total) by mouth daily., Disp: 180 tablet, Rfl: 3   spironolactone (ALDACTONE) 50 MG tablet, Take 50 mg by mouth daily., Disp: , Rfl:   Review of Systems: Denies appetite changes, fevers, chills, fatigue, unexplained weight changes. Denies hearing loss, neck lumps or masses, mouth sores, ringing in ears or voice changes. Denies cough or wheezing.  Denies shortness of breath. Denies chest pain or palpitations. Denies leg swelling. Denies abdominal distention, pain, blood in stools, constipation, diarrhea, nausea, vomiting, or early satiety. Denies pain with intercourse, dysuria, frequency, hematuria or incontinence. Denies hot flashes, pelvic pain, vaginal bleeding or vaginal discharge.   Denies joint pain, back pain or muscle pain/cramps. Denies itching, rash, or wounds. Denies dizziness, headaches, numbness or seizures. Denies swollen lymph nodes or glands, denies easy bruising or bleeding. Denies anxiety, depression, confusion, or decreased concentration.  Physical Exam: BP 123/68 (BP Location: Left Arm, Patient Position: Sitting)   Pulse 73   Temp 97.9 F (36.6 C) (Oral)   Resp 18   Ht '5\' 6"'$  (1.676 m)   Wt 278 lb 9.6 oz (126.4 kg)   SpO2 98%   BMI 44.97 kg/m  General: Alert, oriented, no acute distress. HEENT: Normocephalic, atraumatic, sclera anicteric. Chest: Labored breathing on room air. Abdomen: Obese, soft, nontender.  Normoactive bowel sounds.  No masses or hepatosplenomegaly appreciated.  Laparoscopic incisions healing well, no evidence of erythema, exudate, or induration. Extremities: Grossly normal range of motion.  Warm, well perfused.  No edema bilaterally.  Laboratory &  Radiologic Studies: A. INTRAOVARIAN CONTENTS, RIGHT, EXCISION:  - Compatible with endometriosis   B. OVARY AND FALLOPIAN TUBE, RIGHT, SALPINGO OOPHORECTOMY:  - Benign cyst with hemorrhage, compatible with endometriotic cyst  - Segment of benign unremarkable Fallopian tube   C. UTERINE PENDUNCULATED FIBROID, EXCISION:  - Benign leiomyoma   Assessment & Plan: Kendra Allen is a 25 y.o. woman with Stage IV endometriosis now status post robotic right salpingo-oophorectomy and excision of benign pedunculated fibroid.  The patient is doing very well from a postoperative standpoint.  We discussed continued limitations as well as postoperative expectations.  Reviewed with her in detail findings at the time of surgery and was able to show her pictures that I took him are in her epic record under media.  Given findings at surgery, I think that she has stage IV endometriosis.  I very strongly recommend that she be on some form of hormonal  suppression to help limit her risk of developing an endometrioma on the left ovary or to have significant side effects related to endometriosis.  We also discussed that she may require significant assistance if and when she is ready to pursue fertility options.  I have asked her to call Dr. Talbert Nan to set up a follow-up in the next month to discuss hormonal therapy for endometriosis.  28 minutes of total time was spent for this patient encounter, including preparation, face-to-face counseling with the patient and coordination of care, and documentation of the encounter.  Jeral Pinch, MD  Division of Gynecologic Oncology  Department of Obstetrics and Gynecology  [facility]

## 2021-02-15 ENCOUNTER — Ambulatory Visit: Payer: BC Managed Care – PPO | Admitting: Obstetrics and Gynecology

## 2021-02-22 ENCOUNTER — Other Ambulatory Visit: Payer: Self-pay

## 2021-02-22 ENCOUNTER — Ambulatory Visit (INDEPENDENT_AMBULATORY_CARE_PROVIDER_SITE_OTHER): Payer: BC Managed Care – PPO | Admitting: Nurse Practitioner

## 2021-02-22 ENCOUNTER — Encounter: Payer: Self-pay | Admitting: Nurse Practitioner

## 2021-02-22 VITALS — BP 124/78

## 2021-02-22 DIAGNOSIS — N809 Endometriosis, unspecified: Secondary | ICD-10-CM

## 2021-02-22 DIAGNOSIS — Z30015 Encounter for initial prescription of vaginal ring hormonal contraceptive: Secondary | ICD-10-CM | POA: Diagnosis not present

## 2021-02-22 DIAGNOSIS — Z9889 Other specified postprocedural states: Secondary | ICD-10-CM | POA: Diagnosis not present

## 2021-02-22 DIAGNOSIS — Z8742 Personal history of other diseases of the female genital tract: Secondary | ICD-10-CM

## 2021-02-22 MED ORDER — ETONOGESTREL-ETHINYL ESTRADIOL 0.12-0.015 MG/24HR VA RING: 1.0000 | VAGINAL_RING | VAGINAL | 1 refills | Status: DC

## 2021-02-22 NOTE — Progress Notes (Signed)
   Acute Office Visit  Subjective:    Patient ID: Kendra Allen, female    DOB: 1996-05-17, 25 y.o.   MRN: [id number]   HPI 25 y.o. presents today to discuss hormonal contraception for cyst/endometriosis prevention. 12/30/2020 she underwent robotic assisted laparoscopic RSO with myomectomy of a pedunculated fibroid with findings of stage IV endometriosis with bilateral endometriomas. She was on OCPs for a short time years back and did not tolerate due to mood changes. She is also worried about weight gain. Not sexually active. Had 1 cycle since procedure, prior to surgery she was very irregular. History of PCOS.    Review of Systems  Constitutional: Negative.   Genitourinary:  Positive for menstrual problem.      Objective:    Physical Exam Constitutional:      Appearance: Normal appearance.  GU: not indicated  BP 124/78 (Cuff Size: Large)   LMP 01/26/2021  Wt Readings from Last 3 Encounters:  02/02/21 278 lb 9.6 oz (126.4 kg)  12/30/20 281 lb 1.4 oz (127.5 kg)  12/21/20 281 lb (127.5 kg)        Assessment & Plan:   Problem List Items Addressed This Visit       Other   Endometriosis   Relevant Medications   etonogestrel-ethinyl estradiol (NUVARING) 0.12-0.015 MG/24HR vaginal ring   Other Visit Diagnoses     Encounter for initial prescription of vaginal ring hormonal contraceptive    -  Primary   Relevant Medications   etonogestrel-ethinyl estradiol (NUVARING) 0.12-0.015 MG/24HR vaginal ring   S/P removal of ovarian cyst       Relevant Medications   etonogestrel-ethinyl estradiol (NUVARING) 0.12-0.015 MG/24HR vaginal ring      Plan: Contraceptive options were reviewed, including hormonal methods, both combination (pill, patch, vaginal ring) and progesterone-only (pill, Depo Provera and Nexplanon), intrauterine devices (Mirena, Salix, [place], and [place]), barrier methods (condoms, diaphragm) and female/female Solicitor. The mechanisms, risks, benefits and  side effects of all methods were discussed. She is not interested in LARC at this time. Discussed that combination methods are best for prevention of both cysts and endometriosis. Due to history of OCP use and depressive moods she is agreeable to do Nuvaring. She is aware that there may be some systemic absorption with use. Mother on phone during visit. All questions answered. Patient and mother agreeable to plan.      Tamela Gammon DNP, 3:58 PM 02/22/2021

## 2021-03-01 ENCOUNTER — Telehealth: Payer: Self-pay | Admitting: *Deleted

## 2021-03-01 NOTE — Telephone Encounter (Signed)
Dr.Jertson came and spoke me about this patient and recommended she go to ER they will be able to help her with imaging if needed and pain medication. Patient verbalized she understood, Dr.Jertson did suggest she try the ER on [address].

## 2021-03-01 NOTE — Telephone Encounter (Signed)
If you would like me know what time you would like to work patient in.

## 2021-03-01 NOTE — Telephone Encounter (Signed)
Patient called c/o painful cramping was in tears on phone,had to leave work due to cramps. Patient has surgery with Dr.Tucker right ovary on 12/30/20, has endometriosis. Patient said her cycle started on 02/26/21 and painful cramps started on Sunday mainly on right side pelvic, but today the pain is severe and pain is bilateral, rates pain 8/9 out of 10. She took 800 mg tablets of ibuprofen this am at 6 am, no fever, no vomiting, no vaginal odor or discharge, not sexually active. I did explain if her pain is severe she can report to ER, however patient declined said she would rather be seen here. I told patient you are in surgery until 11am and I would get back with her once I hear from you how to proceed. Please advise

## 2021-03-02 ENCOUNTER — Encounter (HOSPITAL_BASED_OUTPATIENT_CLINIC_OR_DEPARTMENT_OTHER): Payer: Self-pay | Admitting: Obstetrics and Gynecology

## 2021-03-02 ENCOUNTER — Telehealth: Payer: Self-pay | Admitting: *Deleted

## 2021-03-02 ENCOUNTER — Other Ambulatory Visit: Payer: Self-pay

## 2021-03-02 ENCOUNTER — Ambulatory Visit: Payer: BC Managed Care – PPO | Admitting: Obstetrics and Gynecology

## 2021-03-02 ENCOUNTER — Emergency Department (HOSPITAL_BASED_OUTPATIENT_CLINIC_OR_DEPARTMENT_OTHER): Payer: BC Managed Care – PPO

## 2021-03-02 ENCOUNTER — Emergency Department (HOSPITAL_BASED_OUTPATIENT_CLINIC_OR_DEPARTMENT_OTHER)
Admission: EM | Admit: 2021-03-02 | Discharge: 2021-03-02 | Disposition: A | Discharge status: Home / Self Care | Payer: BC Managed Care – PPO | Attending: Emergency Medicine | Admitting: Emergency Medicine

## 2021-03-02 DIAGNOSIS — R1030 Lower abdominal pain, unspecified: Secondary | ICD-10-CM | POA: Insufficient documentation

## 2021-03-02 HISTORY — DX: Reserved for concepts with insufficient information to code with codable children: IMO0002

## 2021-03-02 HISTORY — DX: Endometriosis of pelvic peritoneum: N80.3

## 2021-03-02 LAB — COMPREHENSIVE METABOLIC PANEL
ALT: 12 U/L (ref 0–44)
AST: 12 U/L — ABNORMAL LOW (ref 15–41)
Albumin: 4.3 g/dL (ref 3.5–5.0)
Alkaline Phosphatase: 68 U/L (ref 38–126)
Anion gap: 11 (ref 5–15)
BUN: 8 mg/dL (ref 6–20)
CO2: 24 mmol/L (ref 22–32)
Calcium: 9.4 mg/dL (ref 8.9–10.3)
Chloride: 102 mmol/L (ref 98–111)
Creatinine, Ser: 0.49 mg/dL (ref 0.44–1.00)
GFR, Estimated: >60 mL/min (ref >60)
Glucose, Bld: 75 mg/dL (ref 70–99)
Potassium: 3.4 mmol/L — ABNORMAL LOW (ref 3.5–5.1)
Sodium: 137 mmol/L (ref 135–145)
Total Bilirubin: 0.6 mg/dL (ref 0.3–1.2)
Total Protein: 7.8 g/dL (ref 6.5–8.1)

## 2021-03-02 LAB — URINALYSIS, ROUTINE W REFLEX MICROSCOPIC
Bilirubin Urine: NEGATIVE
Glucose, UA: NEGATIVE mg/dL
Ketones, ur: NEGATIVE mg/dL
Nitrite: NEGATIVE
Specific Gravity, Urine: 1.016 (ref 1.005–1.030)
pH: 6 (ref 5.0–8.0)

## 2021-03-02 LAB — PREGNANCY, URINE: Preg Test, Ur: NEGATIVE

## 2021-03-02 LAB — CBC
HCT: 34.9 % — ABNORMAL LOW (ref 36.0–46.0)
Hemoglobin: 11.1 g/dL — ABNORMAL LOW (ref 12.0–15.0)
MCH: 25.3 pg — ABNORMAL LOW (ref 26.0–34.0)
MCHC: 31.8 g/dL (ref 30.0–36.0)
MCV: 79.5 fL — ABNORMAL LOW (ref 80.0–100.0)
Platelets: 306 10*3/uL (ref 150–400)
RBC: 4.39 MIL/uL (ref 3.87–5.11)
RDW: 15 % (ref 11.5–15.5)
WBC: 9.2 10*3/uL (ref 4.0–10.5)
nRBC: 0 % (ref 0.0–0.2)

## 2021-03-02 LAB — LIPASE, BLOOD: Lipase: 30 U/L (ref 11–51)

## 2021-03-02 MED ORDER — MORPHINE SULFATE (PF) 4 MG/ML IV SOLN
4.0000 mg | Freq: Once | INTRAVENOUS | Status: AC | PRN
  Administered 2021-03-02: 4 mg via INTRAVENOUS
  Filled 2021-03-02: qty 1

## 2021-03-02 MED ORDER — IOHEXOL 350 MG/ML SOLN
100.0000 mL | Freq: Once | INTRAVENOUS | Status: AC | PRN
  Administered 2021-03-02: 100 mL via INTRAVENOUS

## 2021-03-02 MED ORDER — ONDANSETRON HCL 4 MG/2ML IJ SOLN
4.0000 mg | Freq: Once | INTRAMUSCULAR | Status: AC | PRN
  Administered 2021-03-02: 4 mg via INTRAVENOUS
  Filled 2021-03-02: qty 2

## 2021-03-02 MED ORDER — SODIUM CHLORIDE 0.9 % IV BOLUS
500.0000 mL | Freq: Once | INTRAVENOUS | Status: AC
  Administered 2021-03-02: 500 mL via INTRAVENOUS

## 2021-03-02 NOTE — ED Provider Notes (Signed)
[facility] Provider Note   CSN: [id number] Arrival date & time: 03/02/21  1139     History Chief Complaint  Patient presents with   Abdominal Pain    Kendra Allen is a 25 y.o. female who presents today for evaluation of abdominal pain and cramping.  She states that she had a right-sided ovarian cyst in her right ovary removed in July.  She had a menstrual cycle since then and this is her second cycle.  She states that she has pain on the right side.  Her cycle started on Saturday and then she developed the pain 2 to 3 days later.  It started his right side lower abdomen only and then yesterday shifted to the entire lower abdomen.  She reports that she has discomfort in the same place with intermittent sharp shooting pains lasting for few seconds.  She states that yesterday she had to leave work due to the pain being constant.  She took 4 pills of ibuprofen without relief. Her last bowel movement was at 9 AM this morning.  She denies any changes in her bowel movements.  She denies any urinary symptoms including no dysuria, frequency or urgency.  She has not been sexually active for over 2 years.  She has been prescribed NuvaRing however has not started it yet.  She denies any abnormal discharge.  She still has her appendix.    HPI     Past Medical History:  Diagnosis Date   Anemia    Anxiety    Chest pain    Constipation    Depression    Endometriosis of pelvis    Family history of breast cancer    Family history of colon cancer    Family history of ovarian cancer    Family history of prostate cancer    Hidradenitis suppurativa    PCOS (polycystic ovarian syndrome)    Prediabetes    Sleep apnea    Vitamin D deficiency     Patient Active Problem List   Diagnosis Date Noted   Endometriosis    Subserous leiomyoma of uterus    Anovulatory (dysfunctional uterine) bleeding 12/14/2020   Cyst of right ovary 12/14/2020   Cough 09/28/2020    Genetic testing 07/02/2020   Family history of breast cancer    Family history of colon cancer    Family history of prostate cancer    Family history of ovarian cancer    Prediabetes    Well adult exam 04/12/2020   Snorings 04/12/2020   PCOS (polycystic ovarian syndrome) 12/04/2019   Vitamin D deficiency 03/31/2019   Hyperglycemia 03/31/2019   Anemia 01/21/2018   Midsternal chest pain 11/26/2015   Pain in joint, ankle and foot 03/30/2015   Apathy 03/26/2014   Headache 03/26/2014   PMS (premenstrual syndrome) 01/26/2014   Anxiety and depression 01/26/2014   Fever and chills 09/10/2013   Fatigue 09/10/2013   Anxiety attack 06/09/2013   Unspecified constipation 04/02/2013   Diarrhea 04/02/2013   Allergic rhinitis 02/01/2012   Otitis media 12/12/2011   Obesity 03/30/2010   Hidradenitis 12/02/2009    Past Surgical History:  Procedure Laterality Date   CERVICAL POLYPECTOMY     DILATATION & CURETTAGE/HYSTEROSCOPY WITH MYOSURE N/A 11/21/2016   Procedure: DILATATION & CURETTAGE/HYSTEROSCOPY WITH MYOSURE;  [name], MD;  Location: [facility];  Service: Gynecology;  [name];   ROBOTIC ASSISTED LAPAROSCOPIC OVARIAN CYSTECTOMY N/A 12/30/2020   Procedure: XI ROBOTIC ASSISTED LAPAROSCOPIC RIGHT SALPINGO-OOPHORECTOMY WITH PELVIC  WASHINGS;  [name], MD;  [name];  Service: Gynecology;  [name];   WISDOM TOOTH EXTRACTION       OB History     Gravida  0   Para  0   Term  0   Preterm  0   AB  0   Living  0      SAB  0   IAB  0   Ectopic  0   Multiple  0   Live Births  0           Family History  Problem Relation Age of Onset   Hypertension Mother    Depression Mother    Obesity Mother    Sleep apnea Father    Obesity Father    Colon cancer Maternal Uncle 88   Diabetes Paternal Aunt    Heart attack Maternal Grandmother    Prostate cancer Maternal Grandfather        dx 11s, metastatic   Stroke  Paternal Grandfather    Breast cancer Half-Sister 35   Ovarian cancer Maternal Great-grandmother        dx ?, MGF's mother   Breast cancer Other        multiple of mother's paternal cousins   Ovarian cancer Other        multiple of mother's paternal cousins   Prostate cancer Other        multiple of mother's paternal cousins   Endometrial cancer Neg Hx    Pancreatic cancer Neg Hx     Social History   Tobacco Use   Smoking status: Never   Smokeless tobacco: Never  Vaping Use   Vaping Use: Never used  Substance Use Topics   Alcohol use: Yes    Comment: social   Drug use: Never    Home Medications Prior to Admission medications   Medication Sig Start Date End Date Taking? Authorizing Provider  clindamycin (CLEOCIN T) 1 % external solution Apply 1 application topically as needed (hidradenitis suppurativa flare).  12/30/15  Yes [provider]  sertraline (ZOLOFT) 100 MG tablet Take 2 tablets (200 mg total) by mouth daily. 04/09/20  Yes Plotnikov, Evie Lacks, MD  spironolactone (ALDACTONE) 50 MG tablet Take 50 mg by mouth daily. 09/10/19  Yes [provider]  etonogestrel-ethinyl estradiol (NUVARING) 0.12-0.015 MG/24HR vaginal ring Place 1 each vaginally every 28 (twenty-eight) days. Place 1 vaginally every 28 days. Exchange every 28 days skipping placebo week. 02/22/21   Tamela Gammon, NP  hydrOXYzine (VISTARIL) 25 MG capsule TAKE '25MG'$ S BY MOUTH UP TO TWO TIMES DAILY AS NEEDED FOR ANXIETY Patient taking differently: Take 25 mg by mouth 2 (two) times daily as needed for anxiety. 10/29/20   Plotnikov, Evie Lacks, MD    Allergies    Doxycycline and Sulfamethoxazole-trimethoprim  Review of Systems   Review of Systems  Constitutional:  Negative for chills and fever.  HENT:  Negative for congestion.   Respiratory:  Negative for wheezing.   Gastrointestinal:  Positive for abdominal pain. Negative for diarrhea, nausea and vomiting.  Genitourinary:  Positive for  menstrual problem, pelvic pain and vaginal bleeding. Negative for decreased urine volume, dysuria, frequency, hematuria and vaginal discharge.  Musculoskeletal:  Negative for back pain and neck pain.  Skin:  Negative for color change and rash.  Psychiatric/Behavioral:  Negative for confusion. The patient is not nervous/anxious.   All other systems reviewed and are negative.  Physical Exam Updated Vital Signs BP [name])  145/81 (BP Location: Right Arm)   Pulse 81   Temp 97.8 F (36.6 C) (Oral)   Resp 16   LMP 02/26/2021 (Exact Date)   SpO2 99%   Physical Exam Vitals and nursing note reviewed.  Constitutional:      General: She is not in acute distress.    Appearance: She is not ill-appearing.  HENT:     Head: Atraumatic.  Eyes:     Conjunctiva/sclera: Conjunctivae normal.  Cardiovascular:     Rate and Rhythm: Normal rate.  Pulmonary:     Effort: Pulmonary effort is normal. No respiratory distress.  Abdominal:     General: There is no distension.     Palpations: Abdomen is soft.     Tenderness: There is abdominal tenderness in the right lower quadrant, suprapubic area and left lower quadrant. There is no right CVA tenderness or left CVA tenderness.     Hernia: No hernia is present.  Musculoskeletal:     Cervical back: Normal range of motion and neck supple.     Comments: No obvious acute injury  Skin:    General: Skin is warm.  Neurological:     Mental Status: She is alert.     Comments: Awake and alert, answers all questions appropriately.  Speech is not slurred.  Psychiatric:        Mood and Affect: Mood normal.        Behavior: Behavior normal.    ED Results / Procedures / Treatments   Labs (all labs ordered are listed, but only abnormal results are displayed) Labs Reviewed  COMPREHENSIVE METABOLIC PANEL - Abnormal; Notable for the following components:      Result Value   Potassium 3.4 (*)    AST 12 (*)    All other components within normal limits  CBC - Abnormal;  Notable for the following components:   Hemoglobin 11.1 (*)    HCT 34.9 (*)    MCV 79.5 (*)    MCH 25.3 (*)    All other components within normal limits  URINALYSIS, ROUTINE W REFLEX MICROSCOPIC - Abnormal; Notable for the following components:   Hgb urine dipstick LARGE (*)    Protein, ur TRACE (*)    Leukocytes,Ua TRACE (*)    All other components within normal limits  LIPASE, BLOOD  PREGNANCY, URINE    EKG None  Radiology CT Abdomen Pelvis W Contrast  Result Date: 03/02/2021 CLINICAL DATA:  Right lower quadrant pain, cramping EXAM: CT ABDOMEN AND PELVIS WITH CONTRAST TECHNIQUE: Multidetector CT imaging of the abdomen and pelvis was performed using the standard protocol following bolus administration of intravenous contrast. CONTRAST:  115m OMNIPAQUE IOHEXOL 350 MG/ML SOLN COMPARISON:  MR pelvis 12/21/2020 FINDINGS: Lower chest: The lung bases are clear. The imaged heart is unremarkable. Hepatobiliary: The liver and gallbladder are unremarkable. There is no biliary ductal dilatation. Pancreas: Unremarkable. Spleen: Unremarkable. Adrenals/Urinary Tract: The adrenals are unremarkable. There is asymmetrically decreased enhancement of the left kidney on the portal venous phase images and lack of excreted contrast into the collecting system on the delayed images. There is mild left hydroureter and hydronephrosis. No obstructing lesion or stone is seen. There is no right hydronephrosis or hydroureter. Stomach/Bowel: The stomach is unremarkable. There is no evidence of bowel obstruction. There is no abnormal bowel wall thickening or inflammatory change. The appendix is normal (2-57). Vascular/Lymphatic: The abdominal aorta is nonaneurysmal. The major branch vessels are patent. The main portal and splenic veins are patent. Reproductive: The uterus  is enlarged and heterogeneous consistent with fibroids, as seen on the prior pelvic MRI. The left ovary is normal. The right ovarian cystic mass has been  removed. Other: There is no ascites or free air. Soft tissue thickening in the left upper abdominal subcutaneous fat is likely related to prior surgery. Musculoskeletal: There is no acute osseous abnormality or aggressive osseous lesion. IMPRESSION: 1. Mild left hydroureteronephrosis with no excretion of contrast into the collecting system on the delayed images; no stone or other obstructing lesion is seen. Findings may reflect a recently passed stone (though no stone is seen in the bladder), but ureteral stricture or urothelial neoplasm can not be excluded. Recommend CT urogram or retrograde ureteroscopy for further evaluation. 2. No evidence of appendicitis. Electronically Signed   By: Valetta Mole M.D.   On: 03/02/2021 15:09    Procedures Procedures   Medications Ordered in ED Medications  sodium chloride 0.9 % bolus 500 mL (0 mLs Intravenous Stopped 03/02/21 1455)  morphine 4 MG/ML injection 4 mg (4 mg Intravenous Given 03/02/21 1346)  ondansetron (ZOFRAN) injection 4 mg (4 mg Intravenous Given 03/02/21 1346)  iohexol (OMNIPAQUE) 350 MG/ML injection 100 mL (100 mLs Intravenous Contrast Given 03/02/21 1422)    ED Course  I have reviewed the triage vital signs and the nursing notes.  Pertinent labs & imaging results that were available during my care of the patient were reviewed by me and considered in my medical decision making (see chart for details).  Clinical Course as of 03/03/21 1944  Wed Mar 02, 2021  1633 I spoke with urology Dr. Alinda Money.  He recommends outpatient follow-up. [EH]    Clinical Course User Index [EH] Ollen Gross   MDM Rules/Calculators/A&P                           Patient is a 25 year old woman who presents today for evaluation of right lower quadrant abdominal pain.  She had a right ovary removed from a cyst in July.  The pain started on the right side and is now in the bilateral abdomen.  On exam she has bilateral lower abdominal pain right greater  than left.  She is not sexually active, and does not have a right ovary anymore and denies any abnormal discharge therefore pelvic exam is not performed.  She is slightly anemic.  CMP shows mild hypokalemia with potassium of 3.4.  UA shows large blood, microscopy shows 0-5 squamous epithelial cells, 0-5 white cells, and 21-50 red cells.  She is not having significant dysuria, and her pain nature and description does not sound consistent with kidney stones or renal colic.    She was given IV fluids in the emergency room.  Her pain was treated with morphine and Zofran.  CT abdomen pelvis was obtained given right-sided abdominal pain and concern for appendicitis.  Appendix is normal, does show mild left hydroureteronephrosis without excretion of contrast and no stone recently with recommendation for urology follow-up. I spoke with urology Dr. Alinda Money, he feels very unimpressed with the imaging and recommends outpatient follow-up, no need for additional emergent work-up.  Recommended conservative care, OB/GYN follow-up.  I suspect that her pain is either related to her menstrual cycle and is different as she has had surgery with fibroid removal, versus GI cramping.  Doubt torsion give that she doesn't have a right ovary and her pain started left sided.   Return precautions were discussed with patient who states  their understanding.  At the time of discharge patient denied any unaddressed complaints or concerns.  Patient is agreeable for discharge home.  Note: Portions of this report may have been transcribed using voice recognition software. Every effort was made to ensure accuracy; however, inadvertent computerized transcription errors may be present,m  Final Clinical Impression(s) / ED Diagnoses Final diagnoses:  Lower abdominal pain    Rx / DC Orders ED Discharge Orders     None        Lorin Glass, Hershal Coria 03/03/21 1958    Gareth Morgan, MD 03/04/21 1116

## 2021-03-02 NOTE — Discharge Instructions (Addendum)
As we discussed today the CT scan showed that your left ureter, the tube that connects the bladder and the kidney, and the left kidney were slightly abnormal. Please follow-up with urology.    IMPRESSION:  1. Mild left hydroureteronephrosis with no excretion of contrast  into the collecting system on the delayed images; no stone or other  obstructing lesion is seen. Findings may reflect a recently passed  stone (though no stone is seen in the bladder), but ureteral  stricture or urothelial neoplasm can not be excluded. Recommend CT  urogram or retrograde ureteroscopy for further evaluation.  2. No evidence of appendicitis.   Please take Ibuprofen (Advil, motrin) and Tylenol (acetaminophen) to relieve your pain.    You may take up to 600 MG (3 pills) of normal strength ibuprofen every 6 hours as needed.   You make take tylenol, up to 1,000 mg (two extra strength pills) every 6 hours as needed.   It is safe to take ibuprofen and tylenol at the same time as they work differently.   Do not take more than 4,000 mg tylenol in a 24 hour period (not more than one dose every 6 hours.  Please check all medication labels as many medications such as pain and cold medications may contain tylenol.  Do not drink alcohol while taking these medications.  Do not take other NSAID'S while taking ibuprofen (such as aleve or naproxen).  Please take ibuprofen with food to decrease stomach upset.  Today you received medications that may make you sleepy or impair your ability to make decisions.  For the next 24 hours please do not drive, operate heavy machinery, care for a small child with out another adult present, or perform any activities that may cause harm to you or someone else if you were to fall asleep or be impaired.

## 2021-03-02 NOTE — Telephone Encounter (Signed)
Patient walked into office requesting OV.  Patient reports LMP 02/26/21.  Sharp right sided abdominal pain 7/10 on pain scale and nausea, symptoms started 02/28/21. Has been taking Ibuprofen '400mg'$  -'800mg'$  q4-8 hours with very little relief.Last dose of 400 mg was taken this morning at 0740.  Denies fever/chills, vomiting or heavy bleeding. Regular bowel movement this morning. Patient states she is planning to start [place] with this menses as instructed by Marny Lowenstein, NP on 02/22/21.   Advised per review of 03/01/21 telephone encounter, ER recommended for further evaluation with imaging and pain medication if needed.  Advised patient we do not have imaging in office today, instructed patient to go directly to ER for further evaluation.   Reviewed with Dr. Talbert Nan, no additional instructions.   Reviewed recommendations again per Dr. Talbert Nan. Patient states her mom is on the way to accompany her to the ER. Patient verbalizes understanding and thankful for assistance.   Routing to provider for final review. Patient is agreeable to disposition. Will close encounter.

## 2021-03-02 NOTE — Telephone Encounter (Signed)
Patient called left message in triage c/o painful menstrual cycles , report pain is better than yesterday from our conversation. I called patient to discuss, however when patient answered the phone she said she walked into the office to schedule office visit.

## 2021-03-02 NOTE — ED Triage Notes (Signed)
Patient reports to the ER for abdominal pain and cramping. Patient reports she had a cyst and ovary removed on her right side in July and the pain is occurring on the right side. Patient states she called her PCP and was told to come here for imaging.

## 2021-03-03 ENCOUNTER — Encounter: Payer: Self-pay | Admitting: Obstetrics and Gynecology

## 2021-03-03 NOTE — Telephone Encounter (Signed)
Please let the patient know that I have reviewed her labs and CT report from yesterday.  I think she should start her nuvaring today. Just be careful not to pull it out if she is using tampons. I would recommend that she use the ring continuously for the next 3 months. Leave it in for 4 weeks, remove it and place another ring, and repeat. Please schedule her to f/u with me in 3 months.  The Radiologist recommend f/u of her renal findings: Mild left hydroureteronephrosis with no excretion of contrast into the collecting system on the delayed images; no stone or other obstructing lesion is seen. Findings may reflect a recently passed stone (though no stone is seen in the bladder), but ureteral stricture or urothelial neoplasm can not be excluded. Recommend CT urogram or retrograde ureteroscopy for further evaluation. Please place a referral to urology for hydroureteronephrosis.   See what she is taking for pain? I call call in oxycodone and ibuprofen for her, since I don't see that they gave her pain meds in the ER yesterday. Just double check with her.

## 2021-03-09 ENCOUNTER — Ambulatory Visit: Payer: BC Managed Care – PPO | Admitting: Nurse Practitioner

## 2021-03-09 ENCOUNTER — Encounter: Payer: Self-pay | Admitting: Obstetrics and Gynecology

## 2021-03-10 NOTE — Telephone Encounter (Signed)
Spoke with patient regarding FMLA paperwork sent for the patient's mother. Informed her that per Dr. Talbert Nan, we are unable to complete FMLA paperwork for her mother. We are able to send a note stating that the patient was advised by our office to seek emergency care at the Emergency Department on 03/02/2021. Patient agreeable.  Letter pending Dr. Gentry Fitz signature and will scan back to patient via [place].  Encounter closed.

## 2021-03-22 ENCOUNTER — Ambulatory Visit: Payer: BC Managed Care – PPO | Admitting: Obstetrics and Gynecology

## 2021-03-22 ENCOUNTER — Encounter: Payer: Self-pay | Admitting: Obstetrics and Gynecology

## 2021-03-22 ENCOUNTER — Other Ambulatory Visit: Payer: Self-pay

## 2021-03-22 VITALS — BP 116/80 | HR 99 | Ht 66.0 in | Wt 278.0 lb

## 2021-03-22 DIAGNOSIS — F418 Other specified anxiety disorders: Secondary | ICD-10-CM

## 2021-03-22 DIAGNOSIS — R102 Pelvic and perineal pain: Secondary | ICD-10-CM | POA: Diagnosis not present

## 2021-03-22 DIAGNOSIS — N809 Endometriosis, unspecified: Secondary | ICD-10-CM | POA: Diagnosis not present

## 2021-03-22 NOTE — Progress Notes (Signed)
GYNECOLOGY  VISIT   HPI: 25 y.o.   Single Black or African American Not Hispanic or Latino  female   [id number] with Patient's last menstrual period was 02/26/2021 (exact date).   here for evaluation of pelvic pain and depression.   She had an ultrasound in 3/22 for pelvic pain and was noted to have a complex cyst on her right ovary and multiple small fibroids.   F/U ultrasound in 6/22 showed a persistent complex right ovarian cyst with a vascular flow. At that time her pain had improved. She was referred to Dr Berline Lopes for surgery given the atypical appearance of her ovary.   In July of 2022 the patient underwent robotic-assisted laparoscopy right salpingo-oophorectomy and myomectomy with Dr Berline Lopes. She was noted to have stage IV endometriosis with bilateral endometriomas and a right pedunculated uterine fibroid.   Last month she was given a script for the nuvaring.   The patient was seen in the ER on 9/14 with pelvic/abdominal pain that started on the right, then progressed to her entire lower abdomen.  CT scan showed mild left hydroureter and hydronephrosis, no obstructing lesion or stone was seen. Recommendation for urogram or retrograde ureteroscopy was recommended. Fibroid uterus was noted, no adnexal abnormalities.   Patient states that her emotions have been all over the place. She is having trouble eating and drinking. She is feeling very overwhelmed, depressed and anxious. She has tried to talking to a therapist over the phone.  She placed her Nuvaring on Sept 17, 2022. She is not having a lot of pain. She has not had nothing eat for the past two days. She is not sure if that is causing the pain.   She is on Zoloft. Her depression was controlled with the Zoloft. Sometime in the last couple of months her depression got worse. Feeling really bad since the end of last week. Having trouble functioning. Have trouble working. She is overwhelmed at work, teaching 2 grades. She had surgery this  summer, covid this summer, recurrent pain since surgery. She is worried about her diagnosis of the endometriosis and worried about her other ovary. Not sleeping well.   She lives with her parents.   GYNECOLOGIC HISTORY: Patient's last menstrual period was 02/26/2021 (exact date). Contraception:NuvaRing Has had current ring in since 03/05/21  Menopausal hormone therapy: none         OB History     Gravida  0   Para  0   Term  0   Preterm  0   AB  0   Living  0      SAB  0   IAB  0   Ectopic  0   Multiple  0   Live Births  0              Patient Active Problem List   Diagnosis Date Noted   Endometriosis    Subserous leiomyoma of uterus    Anovulatory (dysfunctional uterine) bleeding 12/14/2020   Cyst of right ovary 12/14/2020   Cough 09/28/2020   Genetic testing 07/02/2020   Family history of breast cancer    Family history of colon cancer    Family history of prostate cancer    Family history of ovarian cancer    Prediabetes    Well adult exam 04/12/2020   Snorings 04/12/2020   PCOS (polycystic ovarian syndrome) 12/04/2019   Vitamin D deficiency 03/31/2019   Hyperglycemia 03/31/2019   Anemia 01/21/2018   Midsternal chest pain 11/26/2015  Pain in joint, ankle and foot 03/30/2015   Apathy 03/26/2014   Headache 03/26/2014   PMS (premenstrual syndrome) 01/26/2014   Anxiety and depression 01/26/2014   Fever and chills 09/10/2013   Fatigue 09/10/2013   Anxiety attack 06/09/2013   Unspecified constipation 04/02/2013   Diarrhea 04/02/2013   Allergic rhinitis 02/01/2012   Otitis media 12/12/2011   Obesity 03/30/2010   Hidradenitis 12/02/2009    Past Medical History:  Diagnosis Date   Anemia    Anxiety    Chest pain    Constipation    Depression    Endometriosis of pelvis    Family history of breast cancer    Family history of colon cancer    Family history of ovarian cancer    Family history of prostate cancer    Hidradenitis suppurativa     PCOS (polycystic ovarian syndrome)    Prediabetes    Sleep apnea    Vitamin D deficiency     Past Surgical History:  Procedure Laterality Date   CERVICAL POLYPECTOMY     DILATATION & CURETTAGE/HYSTEROSCOPY WITH MYOSURE N/A 11/21/2016   Procedure: DILATATION & CURETTAGE/HYSTEROSCOPY WITH MYOSURE;  [name], MD;  Location: [facility];  Service: Gynecology;  [name];   ROBOTIC ASSISTED LAPAROSCOPIC OVARIAN CYSTECTOMY N/A 12/30/2020   Procedure: XI ROBOTIC ASSISTED LAPAROSCOPIC RIGHT SALPINGO-OOPHORECTOMY WITH PELVIC WASHINGS;  [name], MD;  [name];  Service: Gynecology;  [name];   WISDOM TOOTH EXTRACTION      Current Outpatient Medications  Medication Sig Dispense Refill   clindamycin (CLEOCIN T) 1 % external solution Apply 1 application topically as needed (hidradenitis suppurativa flare).      etonogestrel-ethinyl estradiol (NUVARING) 0.12-0.015 MG/24HR vaginal ring Place 1 each vaginally every 28 (twenty-eight) days. Place 1 vaginally every 28 days. Exchange every 28 days skipping placebo week. 3 each 1   hydrOXYzine (VISTARIL) 25 MG capsule TAKE 25MG S BY MOUTH UP TO TWO TIMES DAILY AS NEEDED FOR ANXIETY (Patient taking differently: Take 25 mg by mouth 2 (two) times daily as needed for anxiety.) 180 capsule 0   sertraline (ZOLOFT) 100 MG tablet Take 2 tablets (200 mg total) by mouth daily. 180 tablet 3   spironolactone (ALDACTONE) 50 MG tablet Take 50 mg by mouth daily.     No current facility-administered medications for this visit.     ALLERGIES: Doxycycline and Sulfamethoxazole-trimethoprim  Family History  Problem Relation Age of Onset   Hypertension Mother    Depression Mother    Obesity Mother    Sleep apnea Father    Obesity Father    Colon cancer Maternal Uncle 24   Diabetes Paternal Aunt    Heart attack Maternal Grandmother    Prostate cancer Maternal Grandfather        dx 75s, metastatic   Stroke  Paternal Grandfather    Breast cancer Half-Sister 53   Ovarian cancer Maternal Great-grandmother        dx ?, MGF's mother   Breast cancer Other        multiple of mother's paternal cousins   Ovarian cancer Other        multiple of mother's paternal cousins   Prostate cancer Other        multiple of mother's paternal cousins   Endometrial cancer Neg Hx    Pancreatic cancer Neg Hx     Social History   Socioeconomic History   Marital status: Single    Spouse name: Not on file  Number of children: 0   Years of education: Not on file   Highest education level: Not on file  Occupational History   Occupation: school teacher  Tobacco Use   Smoking status: Never   Smokeless tobacco: Never  Vaping Use   Vaping Use: Never used  Substance and Sexual Activity   Alcohol use: Yes    Comment: social   Drug use: Never   Sexual activity: Not Currently  Other Topics Concern   Not on file  Social History Narrative   Not on file   Social Determinants of Health   Financial Resource Strain: Not on file  Food Insecurity: Not on file  Transportation Needs: Not on file  Physical Activity: Not on file  Stress: Not on file  Social Connections: Not on file  Intimate Partner Violence: Not on file    Review of Systems  Gastrointestinal:  Positive for nausea.  Psychiatric/Behavioral:  Positive for depression. The patient is nervous/anxious.   All other systems reviewed and are negative.  PHYSICAL EXAMINATION:    LMP 02/26/2021 (Exact Date) Comment: neg preg test    General appearance: alert, cooperative and appears stated age Tearful throughout the visit.   1. Depression with anxiety Severe. Not controlled on the maximum dose of Zoloft.  She is not suicidal. Has started talking with a counselor Will get her in to see Dr Alain Marion Will give her a note to be excused from work for 48 hours Discussed the option of crisis if needed  2. Endometriosis Stage 4. Had RSO with Dr  Berline Lopes Will refer to [facility]  3. Pelvic pain Currently tolerable  Just started the nuvaring a few weeks ago  Over 20 minutes in total patient care.

## 2021-03-23 ENCOUNTER — Encounter: Payer: Self-pay | Admitting: Internal Medicine

## 2021-03-23 ENCOUNTER — Encounter: Payer: Self-pay | Admitting: Obstetrics and Gynecology

## 2021-03-23 ENCOUNTER — Ambulatory Visit (INDEPENDENT_AMBULATORY_CARE_PROVIDER_SITE_OTHER): Payer: BC Managed Care – PPO | Admitting: Internal Medicine

## 2021-03-23 ENCOUNTER — Telehealth: Payer: Self-pay | Admitting: *Deleted

## 2021-03-23 VITALS — BP 120/72 | HR 86 | Temp 98.4°F

## 2021-03-23 DIAGNOSIS — F419 Anxiety disorder, unspecified: Secondary | ICD-10-CM | POA: Diagnosis not present

## 2021-03-23 DIAGNOSIS — N809 Endometriosis, unspecified: Secondary | ICD-10-CM

## 2021-03-23 DIAGNOSIS — E559 Vitamin D deficiency, unspecified: Secondary | ICD-10-CM | POA: Diagnosis not present

## 2021-03-23 DIAGNOSIS — F32A Depression, unspecified: Secondary | ICD-10-CM

## 2021-03-23 DIAGNOSIS — N970 Female infertility associated with anovulation: Secondary | ICD-10-CM

## 2021-03-23 MED ORDER — KETOROLAC TROMETHAMINE 10 MG PO TABS: 10.0000 mg | ORAL_TABLET | Freq: Three times a day (TID) | ORAL | 1 refills | Status: DC | PRN

## 2021-03-23 MED ORDER — ONDANSETRON HCL 4 MG PO TABS: 4.0000 mg | ORAL_TABLET | Freq: Three times a day (TID) | ORAL | 1 refills | Status: DC | PRN

## 2021-03-23 MED ORDER — VORTIOXETINE HBR 10 MG PO TABS: 10.0000 mg | ORAL_TABLET | Freq: Every day | ORAL | 5 refills | Status: DC

## 2021-03-23 MED ORDER — ACETAMINOPHEN-CODEINE #4 300-60 MG PO TABS: 1.0000 | ORAL_TABLET | ORAL | 1 refills | Status: DC | PRN

## 2021-03-23 MED ORDER — VITAMIN D (ERGOCALCIFEROL) 1.25 MG (50000 UNIT) PO CAPS: 50000.0000 [IU] | ORAL_CAPSULE | ORAL | 0 refills | Status: DC

## 2021-03-23 NOTE — Progress Notes (Signed)
Subjective:  Patient ID: Kendra Allen, female    DOB: Sep 30, 1995  Age: 25 y.o. MRN: [id number]  CC: Depression   HPI Kendra Allen presents for severe depression.  Kendra Allen was worked in today due to nervous breakdown.  She saw Dr. Talbert Nan yesterday when she admitted to being severely depressed.  She is here with her mother who is helping with history.  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  Due to her medical issues she is falling behind.  Extremely depressed, unable to focus, very tearful.  She has been on NuvaRing for a couple weeks-she thinks it may be contributing in her symptoms. Her last period was extremely painful and she is dreadful of having another one.  She was having a lot of pain and nausea with her last period.  GYN ultrasound in 6/22 showed a persistent complex right ovarian cyst with a vascular flow. At that time her pain had improved. She was referred to Dr Berline Lopes for surgery given the atypical appearance of her ovary.    In July of 2022 the patient underwent robotic-assisted laparoscopy right salpingo-oophorectomy and myomectomy with Dr Berline Lopes. She was noted to have stage IV endometriosis with bilateral endometriomas and a right pedunculated uterine fibroid.    Last month she was given a script for the nuvaring.    Outpatient Medications Prior to Visit  Medication Sig Dispense Refill   clindamycin (CLEOCIN T) 1 % external solution Apply 1 application topically as needed (hidradenitis suppurativa flare).      hydrOXYzine (VISTARIL) 25 MG capsule TAKE 25MG S BY MOUTH UP TO TWO TIMES DAILY AS NEEDED FOR ANXIETY (Patient taking differently: Take 25 mg by mouth 2 (two) times daily as needed for anxiety.) 180 capsule 0   sertraline (ZOLOFT) 100 MG tablet Take 2 tablets (200 mg total) by mouth daily. 180 tablet 3   spironolactone (ALDACTONE) 50 MG tablet Take 50 mg by mouth daily.     etonogestrel-ethinyl estradiol (NUVARING) 0.12-0.015  MG/24HR vaginal ring Place 1 each vaginally every 28 (twenty-eight) days. Place 1 vaginally every 28 days. Exchange every 28 days skipping placebo week. (Patient not taking: Reported on 03/23/2021) 3 each 1   No facility-administered medications prior to visit.    ROS: Review of Systems  Constitutional:  Positive for activity change, appetite change and fatigue. Negative for chills and unexpected weight change.  HENT:  Negative for congestion, mouth sores and sinus pressure.   Eyes:  Negative for visual disturbance.  Respiratory:  Negative for cough and chest tightness.   Gastrointestinal:  Negative for abdominal pain and nausea.  Genitourinary:  Negative for difficulty urinating, frequency and vaginal pain.  Musculoskeletal:  Negative for back pain and gait problem.  Skin:  Negative for pallor and rash.  Neurological:  Negative for dizziness, tremors, weakness, numbness and headaches.  Psychiatric/Behavioral:  Positive for decreased concentration and dysphoric mood. Negative for agitation, behavioral problems, confusion, hallucinations, self-injury, sleep disturbance and suicidal ideas. The patient is nervous/anxious. The patient is not hyperactive.    Objective:  BP 120/72 (BP Location: Left Arm)   Pulse 86   Temp 98.4 F (36.9 C) (Oral)   LMP 02/26/2021 (Exact Date) Comment: neg preg test  SpO2 97%   BP Readings from Last 3 Encounters:  03/23/21 120/72  03/22/21 116/80  03/02/21 (!) 145/81    Wt Readings from Last 3 Encounters:  03/22/21 278 lb (126.1 kg)  02/02/21 278 lb 9.6 oz (126.4 kg)  12/30/20 281 lb 1.4 oz (127.5 kg)    Physical Exam Constitutional:      General: She is not in acute distress.    Appearance: She is well-developed. She is obese.  HENT:     Head: Normocephalic.     Right Ear: External ear normal.     Left Ear: External ear normal.     Nose: Nose normal.  Eyes:     General:        Right eye: No discharge.        Left eye: No discharge.      Conjunctiva/sclera: Conjunctivae normal.     Pupils: Pupils are equal, round, and reactive to light.  Neck:     Thyroid: No thyromegaly.     Vascular: No JVD.     Trachea: No tracheal deviation.  Cardiovascular:     Rate and Rhythm: Normal rate and regular rhythm.     Heart sounds: Normal heart sounds.  Pulmonary:     Effort: No respiratory distress.     Breath sounds: No stridor. No wheezing.  Abdominal:     General: Bowel sounds are normal. There is no distension.     Palpations: Abdomen is soft. There is no mass.     Tenderness: There is no abdominal tenderness. There is no guarding or rebound.  Musculoskeletal:        General: No tenderness.     Cervical back: Normal range of motion and neck supple. No rigidity.  Lymphadenopathy:     Cervical: No cervical adenopathy.  Skin:    Findings: No erythema or rash.  Neurological:     Mental Status: She is oriented to person, place, and time.     Cranial Nerves: No cranial nerve deficit.     Motor: No abnormal muscle tone.     Coordination: Coordination normal.     Deep Tendon Reflexes: Reflexes normal.  Psychiatric:        Behavior: Behavior normal.        Thought Content: Thought content normal.        Judgment: Judgment normal.  Kendra Allen is upset and tearful.  She appears depressed.    A total time of 51 minutes was spent preparing to see the patient, reviewing tests, x-rays, operative reports and outside records.  Also, obtaining history and performing comprehensive physical exam.  Additionally, counseling the patient regarding the above listed issues -severe depression, endometriosis and pelvic pain.   Finally, documenting clinical information in the health records, coordination of care, educating the patient. It is a complex case.   Lab Results  Component Value Date   WBC 9.2 03/02/2021   HGB 11.1 (L) 03/02/2021   HCT 34.9 (L) 03/02/2021   PLT 306 03/02/2021   GLUCOSE 75 03/02/2021   CHOL 140 04/09/2020   TRIG 97.0  04/09/2020   HDL 39.30 04/09/2020   LDLCALC 81 04/09/2020   ALT 12 03/02/2021   AST 12 (L) 03/02/2021   NA 137 03/02/2021   K 3.4 (L) 03/02/2021   CL 102 03/02/2021   CREATININE 0.49 03/02/2021   BUN 8 03/02/2021   CO2 24 03/02/2021   TSH 2.69 04/09/2020   HGBA1C 5.9 04/09/2020    CT Abdomen Pelvis W Contrast  Result Date: 03/02/2021 CLINICAL DATA:  Right lower quadrant pain, cramping EXAM: CT ABDOMEN AND PELVIS WITH CONTRAST TECHNIQUE: Multidetector CT imaging of the abdomen and pelvis was performed using the standard protocol following bolus administration of intravenous contrast. CONTRAST:  141mL OMNIPAQUE  IOHEXOL 350 MG/ML SOLN COMPARISON:  MR pelvis 12/21/2020 FINDINGS: Lower chest: The lung bases are clear. The imaged heart is unremarkable. Hepatobiliary: The liver and gallbladder are unremarkable. There is no biliary ductal dilatation. Pancreas: Unremarkable. Spleen: Unremarkable. Adrenals/Urinary Tract: The adrenals are unremarkable. There is asymmetrically decreased enhancement of the left kidney on the portal venous phase images and lack of excreted contrast into the collecting system on the delayed images. There is mild left hydroureter and hydronephrosis. No obstructing lesion or stone is seen. There is no right hydronephrosis or hydroureter. Stomach/Bowel: The stomach is unremarkable. There is no evidence of bowel obstruction. There is no abnormal bowel wall thickening or inflammatory change. The appendix is normal (2-57). Vascular/Lymphatic: The abdominal aorta is nonaneurysmal. The major branch vessels are patent. The main portal and splenic veins are patent. Reproductive: The uterus is enlarged and heterogeneous consistent with fibroids, as seen on the prior pelvic MRI. The left ovary is normal. The right ovarian cystic mass has been removed. Other: There is no ascites or free air. Soft tissue thickening in the left upper abdominal subcutaneous fat is likely related to prior surgery.  Musculoskeletal: There is no acute osseous abnormality or aggressive osseous lesion. IMPRESSION: 1. Mild left hydroureteronephrosis with no excretion of contrast into the collecting system on the delayed images; no stone or other obstructing lesion is seen. Findings may reflect a recently passed stone (though no stone is seen in the bladder), but ureteral stricture or urothelial neoplasm can not be excluded. Recommend CT urogram or retrograde ureteroscopy for further evaluation. 2. No evidence of appendicitis. Electronically Signed   By: Valetta Mole M.D.   On: 03/02/2021 15:09    Assessment & Plan:   Problem List Items Addressed This Visit     Anovulatory (dysfunctional uterine) bleeding    Kendra Allen has been on NuvaRing for a couple weeks-she thinks it may be contributing in her emotional symptoms. Her last period was extremely painful and she is dreadful of having another one.  She was having a lot of pain and nausea with her last period. If she has to remove nuva ring and to have a painful period -I have prescribed her Toradol, Tylenol No. 4 and Zofran to use as needed.      Anxiety and depression    Severe depression.  Kendra Allen was worked in today due to nervous breakdown.  She saw Dr. Talbert Nan yesterday when she admitted to being severely depressed.  She is here with her mother who is helping with history.  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  Due to her medical issues she is falling behind.  Extremely depressed, unable to focus, very tearful.  She has been on NuvaRing for a couple weeks-she thinks it may be contributing in her symptoms.  Will switch to Trintellix.  The patient is receiving counseling through her mother's job.  I suggested that she talks to her human resources in regard to taking a medical leave.       Relevant Medications   vortioxetine HBr (TRINTELLIX) 10 MG TABS tablet   Endometriosis    Painful periods. Kendra Allen has been on  NuvaRing for a couple weeks-she thinks it may be contributing in her emotional symptoms. Her last period was extremely painful and she is dreadful of having another one.  She was having a lot of pain and nausea with her last period. If she has to remove nuva ring and to have a painful period -I  have prescribed her Toradol, Tylenol No. 4 and Zofran to use as needed. GYN ultrasound in 6/22 showed a persistent complex right ovarian cyst with a vascular flow. At that time her pain had improved. She was referred to Dr Berline Lopes for surgery given the atypical appearance of her ovary.    In July of 2022 the patient underwent robotic-assisted laparoscopy right salpingo-oophorectomy and myomectomy with Dr Berline Lopes. She was noted to have stage IV endometriosis with bilateral endometriomas and a right pedunculated uterine fibroid.    Last month she was given a script for the nuvaring.        Vitamin D deficiency    Continue with vitamin D         Follow-up: Return in about 1 week (around 03/30/2021) for a follow-up visit.  Walker Kehr, MD

## 2021-03-23 NOTE — Telephone Encounter (Signed)
Office faxed to the below fax # they will call to schedule. My chart message sent to patient informing her.

## 2021-03-23 NOTE — Telephone Encounter (Signed)
Once office note complete by Dr.Jertson for 104/22 visit, I will fax referral.

## 2021-03-23 NOTE — Telephone Encounter (Signed)
-----   Message from Burnice Logan, RN sent at 03/22/2021  4:46 PM EDT ----- Regarding: Referral Patient will need a referral to [facility] per Dr. Talbert Nan.  Dx: Pelvic Pain and endometriosis  [facility]imally Invasive Gynecologic Surgery Obstetrics & Gynecology  [address], [place], [address] [phone] fax: [phone]  First available provider.   Thank you!

## 2021-03-23 NOTE — Patient Instructions (Signed)
A referral has been placed: [facility] Invasive Gynecologic Surgery Obstetrics & Gynecology  [address] [place], [address] [phone] fax: [phone]

## 2021-03-24 ENCOUNTER — Telehealth: Payer: Self-pay | Admitting: Internal Medicine

## 2021-03-24 NOTE — Telephone Encounter (Signed)
Type of form received - FMLA   Form placed in- Provider mailbox  Additional instructions from the patient- Notify pt. Mother when completed @ [phone]. Pt. Mother also requests that a note be written to allow remote work from home.   Reminded patient that forms take 7-10 business days

## 2021-03-24 NOTE — Telephone Encounter (Signed)
Place in purple folder on MD desk.Marland KitchenJohny Allen

## 2021-03-25 ENCOUNTER — Telehealth: Payer: Self-pay | Admitting: *Deleted

## 2021-03-25 NOTE — Telephone Encounter (Signed)
Rec'd PA for acetaminophen-codeine (TYLENOL #4) 300-60 MG tablet that was rx 03/23/21. Need Dx on why med was rx. Not in ov notes.Marland KitchenJohny Allen  Key: [id number]

## 2021-03-26 NOTE — Assessment & Plan Note (Signed)
Kendra Allen has been on NuvaRing for a couple weeks-she thinks it may be contributing in her emotional symptoms. Her last period was extremely painful and she is dreadful of having another one.  She was having a lot of pain and nausea with her last period. If she has to remove nuva ring and to have a painful period -I have prescribed her Toradol, Tylenol No. 4 and Zofran to use as needed.

## 2021-03-26 NOTE — Assessment & Plan Note (Signed)
Severe depression.  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  She saw Dr. Talbert Nan yesterday when she admitted to being severely depressed.  She is here with her mother who is helping with history.  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  Due to her medical issues she is falling behind.  Extremely depressed, unable to focus, very tearful.  She has been on NuvaRing for a couple weeks-she thinks it may be contributing in her symptoms.  Will switch to Trintellix.  The patient is receiving counseling through her mother's job.  I suggested that she talks to her human resources in regard to taking a medical leave.

## 2021-03-26 NOTE — Assessment & Plan Note (Addendum)
Painful periods. Kendra Allen has been on NuvaRing for a couple weeks-she thinks it may be contributing in her emotional symptoms. Her last period was extremely painful and she is dreadful of having another one.  She was having a lot of pain and nausea with her last period. If she has to remove nuva ring and to have a painful period -I have prescribed her Toradol, Tylenol No. 4 and Zofran to use as needed. GYN ultrasound in 6/22 showed a persistent complex right ovarian cyst with a vascular flow. At that time her pain had improved. She was referred to Dr Berline Lopes for surgery given the atypical appearance of her ovary.    In July of 2022 the patient underwent robotic-assisted laparoscopy right salpingo-oophorectomy and myomectomy with Dr Berline Lopes. She was noted to have stage IV endometriosis with bilateral endometriomas and a right pedunculated uterine fibroid.    Last month she was given a script for the nuvaring.

## 2021-03-26 NOTE — Assessment & Plan Note (Signed)
Continue with vitamin D 

## 2021-03-27 NOTE — Telephone Encounter (Signed)
Now it is.  Diagnosis severe menstrual cramps.  Thanks

## 2021-03-28 NOTE — Telephone Encounter (Signed)
Please advise as the pts mother has called checking for the status of the FMLA paperwork and has stated she can come in today at 3pm if completed.  Please call pts mother at [phone].

## 2021-03-29 ENCOUNTER — Ambulatory Visit: Payer: BC Managed Care – PPO | Admitting: Internal Medicine

## 2021-03-29 NOTE — Telephone Encounter (Signed)
Notified mother MD has FMLA forms.. Form process is anywhere from 5-7 days. Soon as he completed will notify for pick-up,,,/lmb

## 2021-03-30 ENCOUNTER — Telehealth: Payer: Self-pay

## 2021-03-30 NOTE — Telephone Encounter (Signed)
I called and spoke with patient. This morning she started with some spotting and she is said she is not due to change Nuvaring until Saturday. SHe questioned if she should remove it today and put a new one in.  Another issue that her mom had mentioned was that patient is not comfortable removing it herself and re-inserting. I asked patient about this. She said she is not sure. She wanted to think about it while I check with Dr. Talbert Nan on what she should do and she will let me know when I call her back if she needs OV for assistance.

## 2021-03-30 NOTE — Telephone Encounter (Signed)
Mom called stating patient has started spotting with Nuvaring and still several days til she removes.

## 2021-03-31 ENCOUNTER — Ambulatory Visit (INDEPENDENT_AMBULATORY_CARE_PROVIDER_SITE_OTHER): Payer: BC Managed Care – PPO | Admitting: Internal Medicine

## 2021-03-31 ENCOUNTER — Encounter: Payer: Self-pay | Admitting: Internal Medicine

## 2021-03-31 ENCOUNTER — Other Ambulatory Visit: Payer: Self-pay

## 2021-03-31 DIAGNOSIS — N970 Female infertility associated with anovulation: Secondary | ICD-10-CM | POA: Diagnosis not present

## 2021-03-31 DIAGNOSIS — E559 Vitamin D deficiency, unspecified: Secondary | ICD-10-CM

## 2021-03-31 DIAGNOSIS — F419 Anxiety disorder, unspecified: Secondary | ICD-10-CM

## 2021-03-31 DIAGNOSIS — F32A Depression, unspecified: Secondary | ICD-10-CM

## 2021-03-31 DIAGNOSIS — N809 Endometriosis, unspecified: Secondary | ICD-10-CM | POA: Diagnosis not present

## 2021-03-31 MED ORDER — VORTIOXETINE HBR 20 MG PO TABS: 20.0000 mg | ORAL_TABLET | Freq: Every day | ORAL | 5 refills | Status: DC

## 2021-03-31 NOTE — Progress Notes (Signed)
Subjective:  Patient ID: Kendra Allen, female    DOB: 09-09-1995  Age: 25 y.o. MRN: [id number]  CC: Follow-up (1 WEEK F/U-Req FMLA form to be completed & mom need letter requesting to work remotely)   HPI Kendra Allen presents for anxiety and depression - on Trintellix - doing a little better.  Kendra Allen is here with her mom. Still on Nuvaring. Has not been on a new cycle yet. FMLA for mom's job was filled out.  Outpatient Medications Prior to Visit  Medication Sig Dispense Refill   clindamycin (CLEOCIN T) 1 % external solution Apply 1 application topically as needed (hidradenitis suppurativa flare).      hydrOXYzine (VISTARIL) 25 MG capsule TAKE 25MG S BY MOUTH UP TO TWO TIMES DAILY AS NEEDED FOR ANXIETY (Patient taking differently: Take 25 mg by mouth 2 (two) times daily as needed for anxiety.) 180 capsule 0   ondansetron (ZOFRAN) 4 MG tablet Take 1 tablet (4 mg total) by mouth every 8 (eight) hours as needed for nausea or vomiting. 21 tablet 1   spironolactone (ALDACTONE) 50 MG tablet Take 50 mg by mouth daily.     Vitamin D, Ergocalciferol, (DRISDOL) 1.25 MG (50000 UNIT) CAPS capsule Take 1 capsule (50,000 Units total) by mouth every 7 (seven) days. 8 capsule 0   ketorolac (TORADOL) 10 MG tablet Take 1 tablet (10 mg total) by mouth every 8 (eight) hours as needed for severe pain. 20 tablet 1   sertraline (ZOLOFT) 100 MG tablet Take 2 tablets (200 mg total) by mouth daily. 180 tablet 3   vortioxetine HBr (TRINTELLIX) 10 MG TABS tablet Take 1 tablet (10 mg total) by mouth daily. 30 tablet 5   acetaminophen-codeine (TYLENOL #4) 300-60 MG tablet Take 1 tablet by mouth every 4 (four) hours as needed for severe pain. (Patient not taking: Reported on 03/31/2021) 20 tablet 1   etonogestrel-ethinyl estradiol (NUVARING) 0.12-0.015 MG/24HR vaginal ring Place 1 each vaginally every 28 (twenty-eight) days. Place 1 vaginally every 28 days. Exchange every 28 days skipping placebo week. (Patient not  taking: No sig reported) 3 each 1   No facility-administered medications prior to visit.    ROS: Review of Systems  Constitutional:  Negative for activity change, appetite change, chills, fatigue and unexpected weight change.  HENT:  Negative for congestion, mouth sores and sinus pressure.   Eyes:  Negative for visual disturbance.  Respiratory:  Negative for cough and chest tightness.   Gastrointestinal:  Negative for abdominal pain and nausea.  Genitourinary:  Negative for difficulty urinating, frequency and vaginal pain.  Musculoskeletal:  Negative for back pain and gait problem.  Skin:  Negative for pallor and rash.  Neurological:  Negative for dizziness, tremors, weakness, numbness and headaches.  Psychiatric/Behavioral:  Positive for decreased concentration, dysphoric mood and sleep disturbance. Negative for confusion and suicidal ideas. The patient is nervous/anxious.    Objective:  BP 120/78 (BP Location: Left Arm)   Pulse 77   Temp 98.3 F (36.8 C) (Oral)   Ht 5\' 6"  (1.676 m)   Wt 269 lb (122 kg)   SpO2 97%   BMI 43.42 kg/m   BP Readings from Last 3 Encounters:  03/31/21 120/78  03/23/21 120/72  03/22/21 116/80    Wt Readings from Last 3 Encounters:  03/31/21 269 lb (122 kg)  03/22/21 278 lb (126.1 kg)  02/02/21 278 lb 9.6 oz (126.4 kg)    Physical Exam Constitutional:      General: She is not in  acute distress.    Appearance: She is well-developed. She is obese.  HENT:     Head: Normocephalic.     Right Ear: External ear normal.     Left Ear: External ear normal.     Nose: Nose normal.  Eyes:     General:        Right eye: No discharge.        Left eye: No discharge.     Conjunctiva/sclera: Conjunctivae normal.     Pupils: Pupils are equal, round, and reactive to light.  Neck:     Thyroid: No thyromegaly.     Vascular: No JVD.     Trachea: No tracheal deviation.  Cardiovascular:     Rate and Rhythm: Normal rate and regular rhythm.     Heart  sounds: Normal heart sounds.  Pulmonary:     Effort: No respiratory distress.     Breath sounds: No stridor. No wheezing.  Abdominal:     General: Bowel sounds are normal. There is no distension.     Palpations: Abdomen is soft. There is no mass.     Tenderness: There is no abdominal tenderness. There is no guarding or rebound.  Musculoskeletal:        General: No tenderness.     Cervical back: Normal range of motion and neck supple. No rigidity.  Lymphadenopathy:     Cervical: No cervical adenopathy.  Skin:    Findings: No erythema or rash.  Neurological:     Cranial Nerves: No cranial nerve deficit.     Motor: No abnormal muscle tone.     Coordination: Coordination normal.     Gait: Gait normal.     Deep Tendon Reflexes: Reflexes normal.  Psychiatric:        Behavior: Behavior normal.        Thought Content: Thought content normal.        Judgment: Judgment normal.  sad  Lab Results  Component Value Date   WBC 9.2 03/02/2021   HGB 11.1 (L) 03/02/2021   HCT 34.9 (L) 03/02/2021   PLT 306 03/02/2021   GLUCOSE 75 03/02/2021   CHOL 140 04/09/2020   TRIG 97.0 04/09/2020   HDL 39.30 04/09/2020   LDLCALC 81 04/09/2020   ALT 12 03/02/2021   AST 12 (L) 03/02/2021   NA 137 03/02/2021   K 3.4 (L) 03/02/2021   CL 102 03/02/2021   CREATININE 0.49 03/02/2021   BUN 8 03/02/2021   CO2 24 03/02/2021   TSH 2.69 04/09/2020   HGBA1C 5.9 04/09/2020    CT Abdomen Pelvis W Contrast  Result Date: 03/02/2021 CLINICAL DATA:  Right lower quadrant pain, cramping EXAM: CT ABDOMEN AND PELVIS WITH CONTRAST TECHNIQUE: Multidetector CT imaging of the abdomen and pelvis was performed using the standard protocol following bolus administration of intravenous contrast. CONTRAST:  182mL OMNIPAQUE IOHEXOL 350 MG/ML SOLN COMPARISON:  MR pelvis 12/21/2020 FINDINGS: Lower chest: The lung bases are clear. The imaged heart is unremarkable. Hepatobiliary: The liver and gallbladder are unremarkable. There is  no biliary ductal dilatation. Pancreas: Unremarkable. Spleen: Unremarkable. Adrenals/Urinary Tract: The adrenals are unremarkable. There is asymmetrically decreased enhancement of the left kidney on the portal venous phase images and lack of excreted contrast into the collecting system on the delayed images. There is mild left hydroureter and hydronephrosis. No obstructing lesion or stone is seen. There is no right hydronephrosis or hydroureter. Stomach/Bowel: The stomach is unremarkable. There is no evidence of bowel obstruction. There is no  abnormal bowel wall thickening or inflammatory change. The appendix is normal (2-57). Vascular/Lymphatic: The abdominal aorta is nonaneurysmal. The major branch vessels are patent. The main portal and splenic veins are patent. Reproductive: The uterus is enlarged and heterogeneous consistent with fibroids, as seen on the prior pelvic MRI. The left ovary is normal. The right ovarian cystic mass has been removed. Other: There is no ascites or free air. Soft tissue thickening in the left upper abdominal subcutaneous fat is likely related to prior surgery. Musculoskeletal: There is no acute osseous abnormality or aggressive osseous lesion. IMPRESSION: 1. Mild left hydroureteronephrosis with no excretion of contrast into the collecting system on the delayed images; no stone or other obstructing lesion is seen. Findings may reflect a recently passed stone (though no stone is seen in the bladder), but ureteral stricture or urothelial neoplasm can not be excluded. Recommend CT urogram or retrograde ureteroscopy for further evaluation. 2. No evidence of appendicitis. Electronically Signed   By: Valetta Mole M.D.   On: 03/02/2021 15:09    Assessment & Plan:   Problem List Items Addressed This Visit     Anovulatory (dysfunctional uterine) bleeding    Still on Nuvaring. NHas not been on a new cycle yet. FMLA for mom was filled out.      Anxiety and depression    Increase  Trintellix to 20 mg/d. Still on Nuvaring. Has not been on a new cycle yet. FMLA for mom was filled out.      Relevant Medications   vortioxetine HBr (TRINTELLIX) 20 MG TABS tablet   Endometriosis    We gave Rx for Norco, Zofran to use prn      Vitamin D deficiency    On vit D         Follow-up: Return in about 3 weeks (around 04/21/2021) for a follow-up visit.  Walker Kehr, MD

## 2021-03-31 NOTE — Telephone Encounter (Signed)
Pt had appt this am FMLA and letter was given @ that time/../lmb

## 2021-03-31 NOTE — Telephone Encounter (Signed)
I would recommend that she not remove the ring early. The package insert says to remove the ring after 3 weeks, but the ring is FDA approved for 4 weeks. We can help her with removal and reinsertion, but it may not be the best option for her if she can't remove and reinsert it on her own.

## 2021-03-31 NOTE — Assessment & Plan Note (Signed)
On vit D 

## 2021-03-31 NOTE — Assessment & Plan Note (Signed)
Still on Nuvaring. NHas not been on a new cycle yet. FMLA for mom was filled out.

## 2021-03-31 NOTE — Assessment & Plan Note (Addendum)
Increase Trintellix to 20 mg/d. Still on Nuvaring. Has not been on a new cycle yet. FMLA for mom was filled out.

## 2021-03-31 NOTE — Assessment & Plan Note (Signed)
We gave Rx for Norco, Zofran to use prn

## 2021-03-31 NOTE — Telephone Encounter (Signed)
Spoke with patient and informed her. °

## 2021-04-01 NOTE — Telephone Encounter (Signed)
Submitted PA vis cover-my-meds w/ Key #  (Key: [id number]). Waiting on insurance determination.Marland KitchenJohny Chess

## 2021-04-05 NOTE — Telephone Encounter (Signed)
Check status on PA still pending waiting on insurance response.Marland KitchenJohny Allen

## 2021-04-07 ENCOUNTER — Other Ambulatory Visit: Payer: Self-pay

## 2021-04-07 ENCOUNTER — Encounter: Payer: Self-pay | Admitting: Obstetrics & Gynecology

## 2021-04-07 ENCOUNTER — Ambulatory Visit (INDEPENDENT_AMBULATORY_CARE_PROVIDER_SITE_OTHER): Payer: BC Managed Care – PPO | Admitting: Obstetrics & Gynecology

## 2021-04-07 VITALS — BP 108/78

## 2021-04-07 DIAGNOSIS — Z3049 Encounter for surveillance of other contraceptives: Secondary | ICD-10-CM

## 2021-04-07 DIAGNOSIS — N809 Endometriosis, unspecified: Secondary | ICD-10-CM

## 2021-04-07 NOTE — Progress Notes (Signed)
    Kendra Allen 01-May-1996 [id number]        25 y.o.  G0P0000   RP: Unable to remove the Nuvaring  HPI: On Nuvaring for Severe Endometriosis.  First Nuvaring x 4 weeks.  Tried to remove without success.  Feeling well on Nuvaring.  No BTB.  No pelvic pain.   OB History  Gravida Para Term Preterm AB Living  0 0 0 0 0 0  SAB IAB Ectopic Multiple Live Births  0 0 0 0 0    Past medical history,surgical history, problem list, medications, allergies, family history and social history were all reviewed and documented in the EPIC chart.   Directed ROS with pertinent positives and negatives documented in the history of present illness/assessment and plan.  Exam:  Vitals:   04/07/21 1615  BP: 108/78   General appearance:  Normal  Gynecologic exam: Vulva normal.  Easy removal of Nuvaring by myself.  New Nuvaring inserted by patient under my instructions.  Successful insertion.  Attempts at removing by patient were unsuccessful in semi-sitting gynecologic position under my instructions to insert her index as deeply as possible until she felt the ring anteriorly to then bend her finger as a hook and pull out.  Patient was not able to feel the ring.     Assessment/Plan:  25 y.o. G0P0000   1. Encounter for surveillance of nuvaring  Well on Nuvaring x 4 weeks.  Symptoms of Endometriosis controled and Nuvaring well tolerated.  Feeling better on new Anti-Depressant Trintellix.  Unsuccessful removal of the Nuvaring by patient.  Instructions given to facilitate removal and reinsertion.  Will make an appointment in 4 weeks to do it under supervision next month again.  Patient prefers to continue on Nuvaring at this time and feels relieved to proceed with next removal/insertion under supervision.  2. Endometriosis   Princess Bruins MD, 4:56 PM 04/07/2021

## 2021-04-12 NOTE — Telephone Encounter (Signed)
Never received determination for Tylenol # 3.. resubmitted PA to insurance.Marland KitchenAndee Poles

## 2021-04-14 ENCOUNTER — Ambulatory Visit: Payer: BC Managed Care – PPO | Admitting: Obstetrics and Gynecology

## 2021-04-14 NOTE — Telephone Encounter (Signed)
Finally received determination back on PA for Tylenol # 3. Med was approved from dates 04/13/21-10/12/21. Faxed to pharmacy and notified pt via mychart.Marland KitchenJohny Allen

## 2021-04-26 ENCOUNTER — Encounter: Payer: Self-pay | Admitting: Internal Medicine

## 2021-04-26 ENCOUNTER — Ambulatory Visit (INDEPENDENT_AMBULATORY_CARE_PROVIDER_SITE_OTHER): Payer: BC Managed Care – PPO | Admitting: Internal Medicine

## 2021-04-26 ENCOUNTER — Other Ambulatory Visit: Payer: Self-pay

## 2021-04-26 VITALS — BP 130/78 | HR 82 | Temp 98.3°F | Ht 66.0 in | Wt 266.0 lb

## 2021-04-26 DIAGNOSIS — N809 Endometriosis, unspecified: Secondary | ICD-10-CM

## 2021-04-26 DIAGNOSIS — F419 Anxiety disorder, unspecified: Secondary | ICD-10-CM

## 2021-04-26 DIAGNOSIS — F32A Depression, unspecified: Secondary | ICD-10-CM | POA: Diagnosis not present

## 2021-04-26 DIAGNOSIS — F410 Panic disorder [episodic paroxysmal anxiety] without agoraphobia: Secondary | ICD-10-CM | POA: Diagnosis not present

## 2021-04-26 NOTE — Assessment & Plan Note (Signed)
On Trintellix Nuvaring is likely causing emotional  Issues. Kendra Allen does not want to stop before next week GYN appt.

## 2021-04-26 NOTE — Progress Notes (Signed)
Subjective:  Patient ID: Kendra Allen, female    DOB: 12/02/1995  Age: 25 y.o. MRN: [id number]  CC: Follow-up (3 week f/u- Mom states the FMLA form need more information )   HPI Kendra Allen presents for anxiety, depression - up and down, endometriosis, Vit D deficiency. [facility] GYN appt is next week Still on Nuvaring; considering Orilessa  Outpatient Medications Prior to Visit  Medication Sig Dispense Refill   clindamycin (CLEOCIN T) 1 % external solution Apply 1 application topically as needed (hidradenitis suppurativa flare).      hydrOXYzine (VISTARIL) 25 MG capsule TAKE 25MG S BY MOUTH UP TO TWO TIMES DAILY AS NEEDED FOR ANXIETY (Patient taking differently: Take 25 mg by mouth 2 (two) times daily as needed for anxiety.) 180 capsule 0   ondansetron (ZOFRAN) 4 MG tablet Take 1 tablet (4 mg total) by mouth every 8 (eight) hours as needed for nausea or vomiting. 21 tablet 1   spironolactone (ALDACTONE) 50 MG tablet Take 50 mg by mouth daily.     vortioxetine HBr (TRINTELLIX) 20 MG TABS tablet Take 1 tablet (20 mg total) by mouth daily. 30 tablet 5   acetaminophen-codeine (TYLENOL #4) 300-60 MG tablet Take 1 tablet by mouth every 4 (four) hours as needed for severe pain. (Patient not taking: No sig reported) 20 tablet 1   etonogestrel-ethinyl estradiol (NUVARING) 0.12-0.015 MG/24HR vaginal ring Place 1 each vaginally every 28 (twenty-eight) days. Place 1 vaginally every 28 days. Exchange every 28 days skipping placebo week. (Patient not taking: Reported on 04/26/2021) 3 each 1   Vitamin D, Ergocalciferol, (DRISDOL) 1.25 MG (50000 UNIT) CAPS capsule Take 1 capsule (50,000 Units total) by mouth every 7 (seven) days. (Patient not taking: Reported on 04/26/2021) 8 capsule 0   No facility-administered medications prior to visit.    ROS: Review of Systems  Constitutional:  Negative for activity change, appetite change, chills, fatigue and unexpected weight change.  HENT:  Negative for  congestion, mouth sores and sinus pressure.   Eyes:  Negative for visual disturbance.  Respiratory:  Negative for cough and chest tightness.   Gastrointestinal:  Negative for abdominal pain and nausea.  Genitourinary:  Negative for difficulty urinating, frequency and vaginal pain.  Musculoskeletal:  Negative for back pain and gait problem.  Skin:  Negative for pallor and rash.  Neurological:  Negative for dizziness, tremors, weakness, numbness and headaches.  Psychiatric/Behavioral:  Positive for decreased concentration and dysphoric mood. Negative for confusion, sleep disturbance and suicidal ideas. The patient is nervous/anxious.    Objective:  BP 130/78 (BP Location: Left Arm)   Pulse 82   Temp 98.3 F (36.8 C) (Oral)   Ht 5\' 6"  (1.676 m)   Wt 266 lb (120.7 kg)   SpO2 97%   BMI 42.93 kg/m   BP Readings from Last 3 Encounters:  04/26/21 130/78  04/07/21 108/78  03/31/21 120/78    Wt Readings from Last 3 Encounters:  04/26/21 266 lb (120.7 kg)  03/31/21 269 lb (122 kg)  03/22/21 278 lb (126.1 kg)    Physical Exam Constitutional:      General: She is not in acute distress.    Appearance: She is well-developed. She is obese.  HENT:     Head: Normocephalic.     Right Ear: External ear normal.     Left Ear: External ear normal.     Nose: Nose normal.  Eyes:     General:        Right eye:  No discharge.        Left eye: No discharge.     Conjunctiva/sclera: Conjunctivae normal.     Pupils: Pupils are equal, round, and reactive to light.  Neck:     Thyroid: No thyromegaly.     Vascular: No JVD.     Trachea: No tracheal deviation.  Cardiovascular:     Rate and Rhythm: Normal rate and regular rhythm.     Heart sounds: Normal heart sounds.  Pulmonary:     Effort: No respiratory distress.     Breath sounds: No stridor. No wheezing.  Abdominal:     General: Bowel sounds are normal. There is no distension.     Palpations: Abdomen is soft. There is no mass.      Tenderness: There is no abdominal tenderness. There is no guarding or rebound.  Musculoskeletal:        General: No tenderness.     Cervical back: Normal range of motion and neck supple. No rigidity.  Lymphadenopathy:     Cervical: No cervical adenopathy.  Skin:    Findings: No erythema or rash.  Neurological:     Cranial Nerves: No cranial nerve deficit.     Motor: No abnormal muscle tone.     Coordination: Coordination normal.     Deep Tendon Reflexes: Reflexes normal.  Psychiatric:        Behavior: Behavior normal.        Thought Content: Thought content normal.        Judgment: Judgment normal.  sad  Lab Results  Component Value Date   WBC 9.2 03/02/2021   HGB 11.1 (L) 03/02/2021   HCT 34.9 (L) 03/02/2021   PLT 306 03/02/2021   GLUCOSE 75 03/02/2021   CHOL 140 04/09/2020   TRIG 97.0 04/09/2020   HDL 39.30 04/09/2020   LDLCALC 81 04/09/2020   ALT 12 03/02/2021   AST 12 (L) 03/02/2021   NA 137 03/02/2021   K 3.4 (L) 03/02/2021   CL 102 03/02/2021   CREATININE 0.49 03/02/2021   BUN 8 03/02/2021   CO2 24 03/02/2021   TSH 2.69 04/09/2020   HGBA1C 5.9 04/09/2020    CT Abdomen Pelvis W Contrast  Result Date: 03/02/2021 CLINICAL DATA:  Right lower quadrant pain, cramping EXAM: CT ABDOMEN AND PELVIS WITH CONTRAST TECHNIQUE: Multidetector CT imaging of the abdomen and pelvis was performed using the standard protocol following bolus administration of intravenous contrast. CONTRAST:  1110mL OMNIPAQUE IOHEXOL 350 MG/ML SOLN COMPARISON:  MR pelvis 12/21/2020 FINDINGS: Lower chest: The lung bases are clear. The imaged heart is unremarkable. Hepatobiliary: The liver and gallbladder are unremarkable. There is no biliary ductal dilatation. Pancreas: Unremarkable. Spleen: Unremarkable. Adrenals/Urinary Tract: The adrenals are unremarkable. There is asymmetrically decreased enhancement of the left kidney on the portal venous phase images and lack of excreted contrast into the collecting  system on the delayed images. There is mild left hydroureter and hydronephrosis. No obstructing lesion or stone is seen. There is no right hydronephrosis or hydroureter. Stomach/Bowel: The stomach is unremarkable. There is no evidence of bowel obstruction. There is no abnormal bowel wall thickening or inflammatory change. The appendix is normal (2-57). Vascular/Lymphatic: The abdominal aorta is nonaneurysmal. The major branch vessels are patent. The main portal and splenic veins are patent. Reproductive: The uterus is enlarged and heterogeneous consistent with fibroids, as seen on the prior pelvic MRI. The left ovary is normal. The right ovarian cystic mass has been removed. Other: There is no ascites or  free air. Soft tissue thickening in the left upper abdominal subcutaneous fat is likely related to prior surgery. Musculoskeletal: There is no acute osseous abnormality or aggressive osseous lesion. IMPRESSION: 1. Mild left hydroureteronephrosis with no excretion of contrast into the collecting system on the delayed images; no stone or other obstructing lesion is seen. Findings may reflect a recently passed stone (though no stone is seen in the bladder), but ureteral stricture or urothelial neoplasm can not be excluded. Recommend CT urogram or retrograde ureteroscopy for further evaluation. 2. No evidence of appendicitis. Electronically Signed   By: Valetta Mole M.D.   On: 03/02/2021 15:09    Assessment & Plan:   Problem List Items Addressed This Visit     Anxiety and depression    On Trintellix Nuvaring is likely causing emotional  Issues. Afomia does not want to stop before next week GYN appt.      Anxiety attack    On Trintellix Nuvaring is likely causing emotional  Issues. Zenobia does not want to stop before next week GYN appt.      Endometriosis    [facility] GYN appt is next week Still on Nuvaring; considering Thornell Mule Nuvaring is likely causing emotional  Issues. Xareni does not want to  stop before next week GYN appt.         No orders of the defined types were placed in this encounter.     Follow-up: No follow-ups on file.  Walker Kehr, MD

## 2021-04-26 NOTE — Assessment & Plan Note (Addendum)
[facility] appt is next week Still on Nuvaring; considering Kendra Allen Nuvaring is likely causing emotional  Issues. Kendra Allen does not want to stop before next week GYN appt.

## 2021-04-26 NOTE — Assessment & Plan Note (Addendum)
On Trintellix. Consider adding Vraylar Nuvaring is likely causing emotional  Issues. Kendra Allen does not want to stop before next week GYN appt. Mom needs to work from home Psychology referral

## 2021-04-27 ENCOUNTER — Other Ambulatory Visit: Payer: Self-pay

## 2021-04-28 ENCOUNTER — Encounter: Payer: Self-pay | Admitting: Obstetrics and Gynecology

## 2021-04-28 ENCOUNTER — Ambulatory Visit (INDEPENDENT_AMBULATORY_CARE_PROVIDER_SITE_OTHER): Payer: BC Managed Care – PPO | Admitting: Obstetrics and Gynecology

## 2021-04-28 ENCOUNTER — Other Ambulatory Visit: Payer: Self-pay

## 2021-04-28 ENCOUNTER — Telehealth: Payer: Self-pay | Admitting: *Deleted

## 2021-04-28 DIAGNOSIS — F418 Other specified anxiety disorders: Secondary | ICD-10-CM

## 2021-04-28 DIAGNOSIS — N809 Endometriosis, unspecified: Secondary | ICD-10-CM | POA: Diagnosis not present

## 2021-04-28 DIAGNOSIS — Z3044 Encounter for surveillance of vaginal ring hormonal contraceptive device: Secondary | ICD-10-CM | POA: Diagnosis not present

## 2021-04-28 DIAGNOSIS — R102 Pelvic and perineal pain: Secondary | ICD-10-CM | POA: Diagnosis not present

## 2021-04-28 MED ORDER — CLINDAMYCIN PHOSPHATE 1 % EX SOLN: 1.0000 "application " | CUTANEOUS | 1 refills | Status: DC | PRN

## 2021-04-28 NOTE — Telephone Encounter (Signed)
Patient mother called and left detailed message in triage stating patient is unable to remove her nuvaring. Patient is having cramping and spotting with nuvaring and would like to have it removed asap. Mother reports she is crying. She has follow up appointment with you scheduled on 05/09/21 per [facility] recommendation, but mother expressed she is not able to wait until then. Mother asked if patient can be seen today for a quick visit. Please advise

## 2021-04-28 NOTE — Progress Notes (Signed)
GYNECOLOGY  VISIT   HPI: 25 y.o.   Single Black or African American Not Hispanic or Latino  female   [id number] with No LMP recorded.   here for nuvaring removal. She is unable to remove the ring herself. She has been so depressed. Dr Alain Marion changed her antidepressant last month, she is seeing a Social worker. She is worried that the hormones from the nuvaring are worsening her mood. She can't get the nuvaring out. She doesn't want another nuvaring. Also had mood changes on OCP's.  She has an appointment at [facility] next week for management of her endometriosis.   GYNECOLOGIC HISTORY: No LMP recorded. Contraception:nuvaring Menopausal hormone therapy: none        OB History     Gravida  0   Para  0   Term  0   Preterm  0   AB  0   Living  0      SAB  0   IAB  0   Ectopic  0   Multiple  0   Live Births  0              Patient Active Problem List   Diagnosis Date Noted   Endometriosis    Subserous leiomyoma of uterus    Anovulatory (dysfunctional uterine) bleeding 12/14/2020   Cyst of right ovary 12/14/2020   Cough 09/28/2020   Genetic testing 07/02/2020   Family history of breast cancer    Family history of colon cancer    Family history of prostate cancer    Family history of ovarian cancer    Prediabetes    Well adult exam 04/12/2020   Snorings 04/12/2020   PCOS (polycystic ovarian syndrome) 12/04/2019   Vitamin D deficiency 03/31/2019   Hyperglycemia 03/31/2019   Anemia 01/21/2018   Midsternal chest pain 11/26/2015   Pain in joint, ankle and foot 03/30/2015   Apathy 03/26/2014   Headache 03/26/2014   PMS (premenstrual syndrome) 01/26/2014   Anxiety and depression 01/26/2014   Fever and chills 09/10/2013   Fatigue 09/10/2013   Anxiety attack 06/09/2013   Unspecified constipation 04/02/2013   Diarrhea 04/02/2013   Allergic rhinitis 02/01/2012   Otitis media 12/12/2011   Obesity 03/30/2010   Hidradenitis 12/02/2009    Past Medical History:   Diagnosis Date   Anemia    Anxiety    Chest pain    Constipation    Depression    Endometriosis of pelvis    Family history of breast cancer    Family history of colon cancer    Family history of ovarian cancer    Family history of prostate cancer    Hidradenitis suppurativa    PCOS (polycystic ovarian syndrome)    Prediabetes    Sleep apnea    Vitamin D deficiency     Past Surgical History:  Procedure Laterality Date   CERVICAL POLYPECTOMY     DILATATION & CURETTAGE/HYSTEROSCOPY WITH MYOSURE N/A 11/21/2016   Procedure: DILATATION & CURETTAGE/HYSTEROSCOPY WITH MYOSURE;  [name], MD;  Location: [facility];  Service: Gynecology;  [name];   ROBOTIC ASSISTED LAPAROSCOPIC OVARIAN CYSTECTOMY N/A 12/30/2020   Procedure: XI ROBOTIC ASSISTED LAPAROSCOPIC RIGHT SALPINGO-OOPHORECTOMY WITH PELVIC WASHINGS;  [name], MD;  [name];  Service: Gynecology;  [name];   WISDOM TOOTH EXTRACTION      Current Outpatient Medications  Medication Sig Dispense Refill   acetaminophen-codeine (TYLENOL #4) 300-60 MG tablet Take 1 tablet by mouth every 4 (four)  hours as needed for severe pain. (Patient not taking: No sig reported) 20 tablet 1   clindamycin (CLEOCIN T) 1 % external solution Apply 1 application topically as needed (hidradenitis suppurativa flare). 30 mL 1   etonogestrel-ethinyl estradiol (NUVARING) 0.12-0.015 MG/24HR vaginal ring Place 1 each vaginally every 28 (twenty-eight) days. Place 1 vaginally every 28 days. Exchange every 28 days skipping placebo week. (Patient not taking: Reported on 04/26/2021) 3 each 1   hydrOXYzine (VISTARIL) 25 MG capsule TAKE 25MG S BY MOUTH UP TO TWO TIMES DAILY AS NEEDED FOR ANXIETY (Patient taking differently: Take 25 mg by mouth 2 (two) times daily as needed for anxiety.) 180 capsule 0   ondansetron (ZOFRAN) 4 MG tablet Take 1 tablet (4 mg total) by mouth every 8 (eight) hours as needed for nausea or  vomiting. 21 tablet 1   spironolactone (ALDACTONE) 50 MG tablet Take 50 mg by mouth daily.     vortioxetine HBr (TRINTELLIX) 20 MG TABS tablet Take 1 tablet (20 mg total) by mouth daily. 30 tablet 5   No current facility-administered medications for this visit.     ALLERGIES: Doxycycline and Sulfamethoxazole-trimethoprim  Family History  Problem Relation Age of Onset   Hypertension Mother    Depression Mother    Obesity Mother    Sleep apnea Father    Obesity Father    Colon cancer Maternal Uncle 53   Diabetes Paternal Aunt    Heart attack Maternal Grandmother    Prostate cancer Maternal Grandfather        dx 37s, metastatic   Stroke Paternal Grandfather    Breast cancer Half-Sister 34   Ovarian cancer Maternal Great-grandmother        dx ?, MGF's mother   Breast cancer Other        multiple of mother's paternal cousins   Ovarian cancer Other        multiple of mother's paternal cousins   Prostate cancer Other        multiple of mother's paternal cousins   Endometrial cancer Neg Hx    Pancreatic cancer Neg Hx     Social History   Socioeconomic History   Marital status: Single    Spouse name: Not on file   Number of children: 0   Years of education: Not on file   Highest education level: Not on file  Occupational History   Occupation: school teacher  Tobacco Use   Smoking status: Never   Smokeless tobacco: Never  Vaping Use   Vaping Use: Never used  Substance and Sexual Activity   Alcohol use: Yes    Comment: social   Drug use: Never   Sexual activity: Not Currently    Birth control/protection: Inserts    Comment: nuvaring  Other Topics Concern   Not on file  Social History Narrative   Not on file   Social Determinants of Health   Financial Resource Strain: Not on file  Food Insecurity: Not on file  Transportation Needs: Not on file  Physical Activity: Not on file  Stress: Not on file  Social Connections: Not on file  Intimate Partner Violence: Not  on file    ROS  PHYSICAL EXAMINATION:    There were no vitals taken for this visit.    General appearance: alert, cooperative and appears stated age. Very tearful throughout the visit.  Pelvic: External genitalia:  no lesions              Urethra:  normal appearing urethra  with no masses, tenderness or lesions              Bartholins and Skenes: normal                 Vagina: nuvaring removed  1. Encounter for surveillance of vaginal ring hormonal contraceptive device Ring removed, declines OCP's  2. Depression with anxiety Being managed by Dr Alain Marion, she will f/u with him.  Her mom is staying with her right now  3. Endometriosis F/U with [facility] next week  4. Pelvic pain F/U with [facility] next week

## 2021-04-28 NOTE — Telephone Encounter (Signed)
Spoke with patient mother and informed her that Dr.Jertson said she can come in today at 2:45pm to have nuvaring removed only, no other issues can be discussed at that visit. Spoke with patient mother and she understood, mother states that is what patient wanted.

## 2021-05-03 ENCOUNTER — Emergency Department (HOSPITAL_BASED_OUTPATIENT_CLINIC_OR_DEPARTMENT_OTHER): Payer: BC Managed Care – PPO

## 2021-05-03 ENCOUNTER — Encounter (HOSPITAL_BASED_OUTPATIENT_CLINIC_OR_DEPARTMENT_OTHER): Payer: Self-pay | Admitting: *Deleted

## 2021-05-03 ENCOUNTER — Telehealth: Payer: Self-pay | Admitting: Internal Medicine

## 2021-05-03 ENCOUNTER — Other Ambulatory Visit: Payer: Self-pay

## 2021-05-03 ENCOUNTER — Emergency Department (HOSPITAL_BASED_OUTPATIENT_CLINIC_OR_DEPARTMENT_OTHER)
Admission: EM | Admit: 2021-05-03 | Discharge: 2021-05-03 | Disposition: A | Discharge status: Home / Self Care | Payer: BC Managed Care – PPO | Attending: Emergency Medicine | Admitting: Emergency Medicine

## 2021-05-03 DIAGNOSIS — N9489 Other specified conditions associated with female genital organs and menstrual cycle: Secondary | ICD-10-CM | POA: Insufficient documentation

## 2021-05-03 DIAGNOSIS — R1032 Left lower quadrant pain: Secondary | ICD-10-CM | POA: Diagnosis present

## 2021-05-03 LAB — URINALYSIS, ROUTINE W REFLEX MICROSCOPIC
Bilirubin Urine: NEGATIVE
Glucose, UA: NEGATIVE mg/dL
Ketones, ur: NEGATIVE mg/dL
Leukocytes,Ua: NEGATIVE
Nitrite: NEGATIVE
Protein, ur: NEGATIVE mg/dL
RBC / HPF: >50 RBC/hpf — ABNORMAL HIGH (ref 0–5)
Specific Gravity, Urine: 1.009 (ref 1.005–1.030)
pH: 6 (ref 5.0–8.0)

## 2021-05-03 LAB — COMPREHENSIVE METABOLIC PANEL
ALT: 6 U/L (ref 0–44)
AST: 8 U/L — ABNORMAL LOW (ref 15–41)
Albumin: 4.1 g/dL (ref 3.5–5.0)
Alkaline Phosphatase: 51 U/L (ref 38–126)
Anion gap: 10 (ref 5–15)
BUN: 6 mg/dL (ref 6–20)
CO2: 24 mmol/L (ref 22–32)
Calcium: 9.3 mg/dL (ref 8.9–10.3)
Chloride: 103 mmol/L (ref 98–111)
Creatinine, Ser: 0.65 mg/dL (ref 0.44–1.00)
GFR, Estimated: >60 mL/min (ref >60)
Glucose, Bld: 104 mg/dL — ABNORMAL HIGH (ref 70–99)
Potassium: 3.2 mmol/L — ABNORMAL LOW (ref 3.5–5.1)
Sodium: 137 mmol/L (ref 135–145)
Total Bilirubin: 0.6 mg/dL (ref 0.3–1.2)
Total Protein: 7.7 g/dL (ref 6.5–8.1)

## 2021-05-03 LAB — CBC WITH DIFFERENTIAL/PLATELET
Abs Immature Granulocytes: 0.03 10*3/uL (ref 0.00–0.07)
Basophils Absolute: 0.1 10*3/uL (ref 0.0–0.1)
Basophils Relative: 1 %
Eosinophils Absolute: 0.2 10*3/uL (ref 0.0–0.5)
Eosinophils Relative: 1 %
HCT: 36 % (ref 36.0–46.0)
Hemoglobin: 11.5 g/dL — ABNORMAL LOW (ref 12.0–15.0)
Immature Granulocytes: 0 %
Lymphocytes Relative: 18 %
Lymphs Abs: 1.8 10*3/uL (ref 0.7–4.0)
MCH: 25.4 pg — ABNORMAL LOW (ref 26.0–34.0)
MCHC: 31.9 g/dL (ref 30.0–36.0)
MCV: 79.6 fL — ABNORMAL LOW (ref 80.0–100.0)
Monocytes Absolute: 1 10*3/uL (ref 0.1–1.0)
Monocytes Relative: 10 %
Neutro Abs: 7.3 10*3/uL (ref 1.7–7.7)
Neutrophils Relative %: 70 %
Platelets: 316 10*3/uL (ref 150–400)
RBC: 4.52 MIL/uL (ref 3.87–5.11)
RDW: 14.6 % (ref 11.5–15.5)
WBC: 10.4 10*3/uL (ref 4.0–10.5)
nRBC: 0 % (ref 0.0–0.2)

## 2021-05-03 LAB — HCG, QUANTITATIVE, PREGNANCY: hCG, Beta Chain, Quant, S: <1 m[IU]/mL (ref <5)

## 2021-05-03 LAB — PREGNANCY, URINE: Preg Test, Ur: NEGATIVE

## 2021-05-03 MED ORDER — SODIUM CHLORIDE 0.9 % IV BOLUS
1000.0000 mL | Freq: Once | INTRAVENOUS | Status: AC
  Administered 2021-05-03: 1000 mL via INTRAVENOUS

## 2021-05-03 MED ORDER — MORPHINE SULFATE (PF) 4 MG/ML IV SOLN
4.0000 mg | Freq: Once | INTRAVENOUS | Status: AC
  Administered 2021-05-03: 4 mg via INTRAVENOUS
  Filled 2021-05-03: qty 1

## 2021-05-03 MED ORDER — KETOROLAC TROMETHAMINE 30 MG/ML IJ SOLN
30.0000 mg | Freq: Once | INTRAMUSCULAR | Status: AC
  Administered 2021-05-03: 30 mg via INTRAVENOUS
  Filled 2021-05-03: qty 1

## 2021-05-03 MED ORDER — IBUPROFEN 600 MG PO TABS: 600.0000 mg | ORAL_TABLET | Freq: Four times a day (QID) | ORAL | 0 refills | Status: DC | PRN

## 2021-05-03 MED ORDER — ONDANSETRON HCL 4 MG/2ML IJ SOLN
4.0000 mg | Freq: Once | INTRAMUSCULAR | Status: AC
  Administered 2021-05-03: 4 mg via INTRAVENOUS
  Filled 2021-05-03: qty 2

## 2021-05-03 NOTE — ED Provider Notes (Signed)
Care of the patient assumed at the change of shift. Here for pelvic pain, labs and [place] neg. Awaiting UA.    Physical Exam  BP 121/66   Pulse 81   Temp 98.2 F (36.8 C) (Oral)   Resp 16   Ht 5\' 6"  (1.676 m)   Wt 120 kg   LMP 05/01/2021   SpO2 97%   BMI 42.70 kg/m   Physical Exam  ED Course/Procedures     Procedures  MDM  UA is neg for signs of infection. Preg is neg. Patient has Tylenol #3 at home, will also rx Motrin. Follow up with Ob/Gyn.        Truddie Hidden, MD 05/03/21 [phone]

## 2021-05-03 NOTE — Telephone Encounter (Signed)
Caller connected to [employer] 11.14.2022.   The caller states that her daughter is an endometriosis patient and has been experiencing severe nausea, cramping, and vomiting. The caller states that her daughter is currently on aldosterone; however, this medication is not alleviating the nausea and the mother is wondering if a different medication can be prescribed.   Caller states her daughter has endometriosis. Has had some severe cramping, but it is better. Pain is 8 out of 10 in the pelvis region. Also has Hemorroids and has had bleeding from rectum and vagina. Is having N/ V and cannot keep anything down.   Pt. Advised to go to ED.

## 2021-05-03 NOTE — ED Triage Notes (Signed)
Left sided abdominal pain for about 3 days. Nausea and vomiting. Hx of endometriosis. Took phenergan and tylenol with codeine about 0400

## 2021-05-03 NOTE — ED Provider Notes (Signed)
[facility] EMERGENCY DEPT Provider Note   CSN: [id number] Arrival date & time: 05/03/21  0522     History Chief Complaint  Patient presents with   Abdominal Pain    Kendra Allen is a 25 y.o. female.  Patient is a 25 year old female with past medical history of endometriosis, polycystic ovaries, prior right ovary removal secondary to cyst/torsion.  Patient presenting today with complaints of lower abdominal pain for the past 3 days.  This feels similar to her endometriosis, however is uncontrolled with the pain medicine she has at home.  She denies any fevers or chills.  Last menstrual period is current.  She is not sexually active and denies the possibility of pregnancy.  Patient is seen by GYN 5 days ago and had her Nuva ring removed, and is due to see her endometriosis specialist and [facility] tomorrow.  Patient's mother called the on-call GYN at [facility] and was instructed to come to the ER for further evaluation.  The history is provided by the patient.  Abdominal Pain Pain location:  Suprapubic and LLQ Pain quality: stabbing   Pain radiates to:  Does not radiate Pain severity:  Severe Onset quality:  Gradual Duration:  3 days Timing:  Constant Progression:  Worsening Chronicity:  Recurrent     Past Medical History:  Diagnosis Date   Anemia    Anxiety    Chest pain    Constipation    Depression    Endometriosis of pelvis    Family history of breast cancer    Family history of colon cancer    Family history of ovarian cancer    Family history of prostate cancer    Hidradenitis suppurativa    PCOS (polycystic ovarian syndrome)    Prediabetes    Sleep apnea    Vitamin D deficiency     Patient Active Problem List   Diagnosis Date Noted   Endometriosis    Subserous leiomyoma of uterus    Anovulatory (dysfunctional uterine) bleeding 12/14/2020   Cyst of right ovary 12/14/2020   Cough 09/28/2020   Genetic testing 07/02/2020   Family  history of breast cancer    Family history of colon cancer    Family history of prostate cancer    Family history of ovarian cancer    Prediabetes    Well adult exam 04/12/2020   Snorings 04/12/2020   PCOS (polycystic ovarian syndrome) 12/04/2019   Vitamin D deficiency 03/31/2019   Hyperglycemia 03/31/2019   Anemia 01/21/2018   Midsternal chest pain 11/26/2015   Pain in joint, ankle and foot 03/30/2015   Apathy 03/26/2014   Headache 03/26/2014   PMS (premenstrual syndrome) 01/26/2014   Anxiety and depression 01/26/2014   Fever and chills 09/10/2013   Fatigue 09/10/2013   Anxiety attack 06/09/2013   Unspecified constipation 04/02/2013   Diarrhea 04/02/2013   Allergic rhinitis 02/01/2012   Otitis media 12/12/2011   Obesity 03/30/2010   Hidradenitis 12/02/2009    Past Surgical History:  Procedure Laterality Date   CERVICAL POLYPECTOMY     DILATATION & CURETTAGE/HYSTEROSCOPY WITH MYOSURE N/A 11/21/2016   Procedure: DILATATION & CURETTAGE/HYSTEROSCOPY WITH MYOSURE;  [name], MD;  Location: [place] [facility];  Service: Gynecology;  [name];   ROBOTIC ASSISTED LAPAROSCOPIC OVARIAN CYSTECTOMY N/A 12/30/2020   Procedure: XI ROBOTIC ASSISTED LAPAROSCOPIC RIGHT SALPINGO-OOPHORECTOMY WITH PELVIC WASHINGS;  [name], MD;  [name];  Service: Gynecology;  [name];   WISDOM TOOTH EXTRACTION  OB History     Gravida  0   Para  0   Term  0   Preterm  0   AB  0   Living  0      SAB  0   IAB  0   Ectopic  0   Multiple  0   Live Births  0           Family History  Problem Relation Age of Onset   Hypertension Mother    Depression Mother    Obesity Mother    Sleep apnea Father    Obesity Father    Colon cancer Maternal Uncle 51   Diabetes Paternal Aunt    Heart attack Maternal Grandmother    Prostate cancer Maternal Grandfather        dx 14s, metastatic   Stroke Paternal Grandfather    Breast cancer  Half-Sister 5   Ovarian cancer Maternal Great-grandmother        dx ?, MGF's mother   Breast cancer Other        multiple of mother's paternal cousins   Ovarian cancer Other        multiple of mother's paternal cousins   Prostate cancer Other        multiple of mother's paternal cousins   Endometrial cancer Neg Hx    Pancreatic cancer Neg Hx     Social History   Tobacco Use   Smoking status: Never   Smokeless tobacco: Never  Vaping Use   Vaping Use: Never used  Substance Use Topics   Alcohol use: Yes    Comment: social   Drug use: Never    Home Medications Prior to Admission medications   Medication Sig Start Date End Date Taking? Authorizing Provider  acetaminophen-codeine (TYLENOL #4) 300-60 MG tablet Take 1 tablet by mouth every 4 (four) hours as needed for severe pain. Patient not taking: No sig reported 03/23/21   Plotnikov, Evie Lacks, MD  clindamycin (CLEOCIN T) 1 % external solution Apply 1 application topically as needed (hidradenitis suppurativa flare). 04/28/21   Plotnikov, Evie Lacks, MD  etonogestrel-ethinyl estradiol (NUVARING) 0.12-0.015 MG/24HR vaginal ring Place 1 each vaginally every 28 (twenty-eight) days. Place 1 vaginally every 28 days. Exchange every 28 days skipping placebo week. Patient not taking: Reported on 04/26/2021 02/22/21   Marny Lowenstein A, NP  hydrOXYzine (VISTARIL) 25 MG capsule TAKE 25MG S BY MOUTH UP TO TWO TIMES DAILY AS NEEDED FOR ANXIETY Patient taking differently: Take 25 mg by mouth 2 (two) times daily as needed for anxiety. 10/29/20   Plotnikov, Evie Lacks, MD  ondansetron (ZOFRAN) 4 MG tablet Take 1 tablet (4 mg total) by mouth every 8 (eight) hours as needed for nausea or vomiting. 03/23/21   Plotnikov, Evie Lacks, MD  spironolactone (ALDACTONE) 50 MG tablet Take 50 mg by mouth daily. 09/10/19   [provider]  vortioxetine HBr (TRINTELLIX) 20 MG TABS tablet Take 1 tablet (20 mg total) by mouth daily. 03/31/21   Plotnikov, Evie Lacks,  MD    Allergies    Doxycycline and Sulfamethoxazole-trimethoprim  Review of Systems   Review of Systems  Gastrointestinal:  Positive for abdominal pain.  All other systems reviewed and are negative.  Physical Exam Updated Vital Signs BP 117/87   Pulse 99   Temp 98.2 F (36.8 C) (Oral)   Resp 18   Ht 5\' 6"  (1.676 m)   Wt 120 kg   LMP 05/01/2021   SpO2 100%  BMI 42.70 kg/m   Physical Exam Vitals and nursing note reviewed.  Constitutional:      General: She is not in acute distress.    Appearance: She is well-developed. She is not diaphoretic.  HENT:     Head: Normocephalic and atraumatic.  Cardiovascular:     Rate and Rhythm: Normal rate and regular rhythm.     Heart sounds: No murmur heard.   No friction rub. No gallop.  Pulmonary:     Effort: Pulmonary effort is normal. No respiratory distress.     Breath sounds: Normal breath sounds. No wheezing.  Abdominal:     General: Bowel sounds are normal. There is no distension.     Palpations: Abdomen is soft.     Tenderness: There is abdominal tenderness in the suprapubic area and left lower quadrant. There is no right CVA tenderness, left CVA tenderness, guarding or rebound.  Musculoskeletal:        General: Normal range of motion.     Cervical back: Normal range of motion and neck supple.  Skin:    General: Skin is warm and dry.  Neurological:     General: No focal deficit present.     Mental Status: She is alert and oriented to person, place, and time.    ED Results / Procedures / Treatments   Labs (all labs ordered are listed, but only abnormal results are displayed) Labs Reviewed - No data to display  EKG None  Radiology No results found.  Procedures Procedures   Medications Ordered in ED Medications  sodium chloride 0.9 % bolus 1,000 mL (has no administration in time range)  ondansetron (ZOFRAN) injection 4 mg (has no administration in time range)  morphine 4 MG/ML injection 4 mg (has no  administration in time range)    ED Course  I have reviewed the triage vital signs and the nursing notes.  Pertinent labs & imaging results that were available during my care of the patient were reviewed by me and considered in my medical decision making (see chart for details).    MDM Rules/Calculators/A&P  Patient with history of chronic abdominal pain related to endometriosis presenting with lower abdominal pain.  She has history of right oophorectomy, and ultrasound today shows no evidence for ovarian torsion.  Laboratory studies are otherwise unremarkable.  Patient awaiting results of urinalysis and hCG.  Care to be signed out to oncoming provider at shift change pending the above results.  Final Clinical Impression(s) / ED Diagnoses Final diagnoses:  LLQ pain    Rx / DC Orders ED Discharge Orders     None        Veryl Speak, MD 05/04/21 [phone]

## 2021-05-04 ENCOUNTER — Other Ambulatory Visit: Payer: Self-pay | Admitting: Internal Medicine

## 2021-05-04 MED ORDER — ONDANSETRON HCL 4 MG PO TABS: 4.0000 mg | ORAL_TABLET | Freq: Three times a day (TID) | ORAL | 1 refills | Status: DC | PRN

## 2021-05-04 NOTE — Telephone Encounter (Signed)
Notified pt mom w/ MD response. Also she states that she will need a excuse note for work. She been out of work since the last appt. Inform mom will generate letter from her chart and she can pull from [place].Marland KitchenJohny Allen

## 2021-05-04 NOTE — Telephone Encounter (Signed)
Kendra Allen went to ER yesterday.  I will send a prescription for Zofran.  Thanks

## 2021-05-06 ENCOUNTER — Telehealth: Payer: Self-pay

## 2021-05-06 NOTE — Telephone Encounter (Signed)
Patient canceled Nuvaring follow up appointment for 05/09/21. States she  no longer needs it. She will keep her aex on 05/19/21.

## 2021-05-09 ENCOUNTER — Ambulatory Visit: Payer: BC Managed Care – PPO | Admitting: Obstetrics and Gynecology

## 2021-05-14 ENCOUNTER — Other Ambulatory Visit: Payer: Self-pay | Admitting: Internal Medicine

## 2021-05-16 NOTE — Progress Notes (Signed)
25 y.o. G0P0000 Single Black or African American Not Hispanic or Latino female here for annual exam.    She has a h/o PCOS, oligomenorrhea, hirsutism and severe endometriosis.  Not sexually active.   In 7/22 she had a robotic assisted laparoscopic right salpingoophorectomy and myomectomy with Dr Berline Lopes. She had stage IV endometriosis. She was put on OCP's then nuvaring after surgery, unable to tolerate it. She has been struggling with pain and severe depression.  She was recently seen at [facility] minimally invasive GYN surgery clinic at [facility]. They started her on daily aygestin, prn robaxin and prn oral toradol. She was referred to pelvic floor PT. She is doing well on the daily aygestin, not currently bleeding, no currently in pain. Nausea is better.   Dr Alain Marion is managing her depression. She is on medication and talking to a counselor. Doing better.   She has a 1/2 sister with breast cancer at 55. She has an elevated risk of breast cancer of 20.3%. Negative genetic testing. The Genetic counselor recommended mammograms at 70 and breast MRI's at 26  Period Cycle (Days): 30 Period Duration (Days): 14 Period Pattern: Regular Menstrual Flow: Heavy Menstrual Control: Maxi pad Menstrual Control Change Freq (Hours): changes pad 2-3 times a day Dysmenorrhea: (!) Severe Dysmenorrhea Symptoms: Cramping, Nausea, Headache  Patient's last menstrual period was 05/01/2021.          Sexually active: No. Not currently The current method of family planning is norethindrone. Exercising: No.  The patient does not participate in regular exercise at present. Smoker:  no  Health Maintenance: Pap: 11/13/18 negative  History of abnormal Pap:  no MMG:  01/31/21 Bi-rads 1 neg  BMD:   n/a  Colonoscopy: none  TDaP:  12/04/11  Gardasil: no   reports that she has never smoked. She has never used smokeless tobacco. She reports current alcohol use. She reports that she does not use drugs. Not currently working, 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   Past Medical History:  Diagnosis Date   Anemia    Anxiety    Chest pain    Constipation    Depression    Endometriosis of pelvis    Family history of breast cancer    Family history of colon cancer    Family history of ovarian cancer    Family history of prostate cancer    Hidradenitis suppurativa    PCOS (polycystic ovarian syndrome)    Prediabetes    Sleep apnea    Vitamin D deficiency     Past Surgical History:  Procedure Laterality Date   CERVICAL POLYPECTOMY     DILATATION & CURETTAGE/HYSTEROSCOPY WITH MYOSURE N/A 11/21/2016   Procedure: DILATATION & CURETTAGE/HYSTEROSCOPY WITH MYOSURE;  [name], MD;  [name];  Service: Gynecology;  [name];   ROBOTIC ASSISTED LAPAROSCOPIC OVARIAN CYSTECTOMY N/A 12/30/2020   Procedure: XI ROBOTIC ASSISTED LAPAROSCOPIC RIGHT SALPINGO-OOPHORECTOMY WITH PELVIC WASHINGS;  [name], MD;  [name];  Service: Gynecology;  [name];   [name], [name], [name], [name], Patient Position: Sitting, Cuff Size: Large)   Pulse 78   Resp 12   Ht 5\' 6"  (1.676 m)   Wt 265 lb (120.2 kg)   LMP 05/01/2021   BMI 42.77 kg/m   Weight change: @WEIGHTCHANGE @ Height:   Height: 5\' 6"  (167.6 cm)  Ht Readings from Last 3 Encounters:  05/19/21 5\' 6"  (1.676 m)  05/03/21 5\' 6"  (1.676 m)  04/26/21 5\' 6"  (1.676 m)    General appearance: alert, cooperative and appears stated age Head: Normocephalic, without obvious abnormality, atraumatic Neck: no adenopathy, supple, symmetrical, trachea midline and thyroid normal to inspection and palpation Lungs: clear to auscultation bilaterally Cardiovascular: regular rate and rhythm Breasts:  normal appearance, no masses or tenderness Abdomen: soft, non-tender; non distended,  no masses,  no organomegaly Extremities: extremities normal, atraumatic, no cyanosis or edema Skin: Skin color, texture, turgor normal. No rashes or lesions Lymph nodes: Cervical, supraclavicular, and axillary nodes normal. No abnormal inguinal nodes palpated Neurologic: Grossly normal   Pelvic: External genitalia:  no lesions              Urethra:  normal appearing urethra with no masses, tenderness or lesions              Bartholins and Skenes: normal  Vagina: normal appearing vagina with normal color and discharge, no lesions              Cervix: no lesions               Bimanual Exam:  Uterus:   no masses or tenderness              Adnexa: no mass, fullness, tenderness                 Marisa Sprinkles chaperoned for the exam.  1. Well woman exam Discussed breast self exam Discussed calcium and vit D intake Labs with primary  2. Screening for cervical cancer No std risk - Cytology - PAP  3. Endometriosis S/P oophorectomy earlier this year. Being seen at [facility], on daily aygestin and will start pelvic floor PT  4. Immunization counseling Will start gardasil series - HPV 9-valent vaccine,Recombinat  5. Family history of breast cancer  6. At high risk for breast cancer Negative genetic testing. Genetic counselor recommended breast MRI starting at 26 and mammograms starting at 31  7. History of major depression Currently much improved F/U with primary  CC: Dr Alain Marion

## 2021-05-19 ENCOUNTER — Other Ambulatory Visit: Payer: Self-pay

## 2021-05-19 ENCOUNTER — Other Ambulatory Visit (HOSPITAL_COMMUNITY)
Admission: RE | Admit: 2021-05-19 | Discharge: 2021-05-19 | Disposition: A | Discharge status: Home / Self Care | Payer: BC Managed Care – PPO | Source: Ambulatory Visit | Attending: Obstetrics and Gynecology | Admitting: Obstetrics and Gynecology

## 2021-05-19 ENCOUNTER — Encounter: Payer: Self-pay | Admitting: Obstetrics and Gynecology

## 2021-05-19 ENCOUNTER — Telehealth: Payer: BC Managed Care – PPO

## 2021-05-19 ENCOUNTER — Ambulatory Visit (INDEPENDENT_AMBULATORY_CARE_PROVIDER_SITE_OTHER): Payer: BC Managed Care – PPO | Admitting: Obstetrics and Gynecology

## 2021-05-19 VITALS — BP 122/80 | HR 78 | Resp 12 | Ht 66.0 in | Wt 265.0 lb

## 2021-05-19 DIAGNOSIS — N809 Endometriosis, unspecified: Secondary | ICD-10-CM

## 2021-05-19 DIAGNOSIS — Z124 Encounter for screening for malignant neoplasm of cervix: Secondary | ICD-10-CM | POA: Diagnosis not present

## 2021-05-19 DIAGNOSIS — Z803 Family history of malignant neoplasm of breast: Secondary | ICD-10-CM

## 2021-05-19 DIAGNOSIS — Z7185 Encounter for immunization safety counseling: Secondary | ICD-10-CM

## 2021-05-19 DIAGNOSIS — Z8659 Personal history of other mental and behavioral disorders: Secondary | ICD-10-CM

## 2021-05-19 DIAGNOSIS — Z01419 Encounter for gynecological examination (general) (routine) without abnormal findings: Secondary | ICD-10-CM

## 2021-05-19 DIAGNOSIS — Z23 Encounter for immunization: Secondary | ICD-10-CM | POA: Diagnosis not present

## 2021-05-19 DIAGNOSIS — Z9189 Other specified personal risk factors, not elsewhere classified: Secondary | ICD-10-CM

## 2021-05-19 NOTE — Patient Instructions (Signed)

## 2021-05-19 NOTE — Telephone Encounter (Signed)
I spoke with patient and asked her to call me in February and we will arrange MRI. I will hold this note until then and I will call her if she forgets to call us.  PA for MRI only good for 30 days so better to wait until closer to time to schedule.

## 2021-05-19 NOTE — Telephone Encounter (Signed)
Kendra Dom, MD  P [facility] Triage  Please set her up for a breast MRI after her 38 birthday. Elevated risk of breast cancer.

## 2021-05-24 LAB — CYTOLOGY - PAP: Diagnosis: NEGATIVE

## 2021-05-26 ENCOUNTER — Ambulatory Visit: Payer: BC Managed Care – PPO | Admitting: Internal Medicine

## 2021-05-26 ENCOUNTER — Other Ambulatory Visit: Payer: Self-pay

## 2021-05-26 ENCOUNTER — Encounter: Payer: Self-pay | Admitting: Internal Medicine

## 2021-05-26 DIAGNOSIS — N809 Endometriosis, unspecified: Secondary | ICD-10-CM

## 2021-05-26 DIAGNOSIS — F419 Anxiety disorder, unspecified: Secondary | ICD-10-CM

## 2021-05-26 DIAGNOSIS — F32A Depression, unspecified: Secondary | ICD-10-CM

## 2021-05-26 DIAGNOSIS — N943 Premenstrual tension syndrome: Secondary | ICD-10-CM

## 2021-05-26 MED ORDER — HYDROXYZINE PAMOATE 25 MG PO CAPS: 25.0000 mg | ORAL_CAPSULE | Freq: Two times a day (BID) | ORAL | 0 refills | Status: DC | PRN

## 2021-05-26 NOTE — Patient Instructions (Signed)
[place]

## 2021-05-26 NOTE — Progress Notes (Signed)
Subjective:  Patient ID: Kendra Allen, female    DOB: 08/26/1995  Age: 25 y.o. MRN: [id number]  CC: Follow-up (4 WEEK F/U) and Medication Refill (Need refill on Hydroxyzine)   HPI Kendra Allen presents for depression, pelvic pain She is seeing Dr. Nikki Dom at [facility] for her endometriosis.  She started to take Aygestin tablets.  So far doing better.  She is here with her mom  Outpatient Medications Prior to Visit  Medication Sig Dispense Refill   acetaminophen-codeine (TYLENOL #4) 300-60 MG tablet Take 1 tablet by mouth every 4 (four) hours as needed for severe pain. 20 tablet 1   clindamycin (CLEOCIN T) 1 % external solution Apply 1 application topically as needed (hidradenitis suppurativa flare). 30 mL 1   ibuprofen (ADVIL) 600 MG tablet Take 1 tablet (600 mg total) by mouth every 6 (six) hours as needed. 30 tablet 0   norethindrone (AYGESTIN) 5 MG tablet Take 2 tablets by mouth daily.     ondansetron (ZOFRAN) 4 MG tablet Take 1 tablet (4 mg total) by mouth every 8 (eight) hours as needed for nausea or vomiting. 21 tablet 1   promethazine (PHENERGAN) 25 MG tablet TAKE 1 TABLET BY MOUTH EVERY 4 HOURS AS NEEDED FOR NAUSEA 6 tablet 1   spironolactone (ALDACTONE) 50 MG tablet Take 50 mg by mouth daily.     vortioxetine HBr (TRINTELLIX) 20 MG TABS tablet Take 1 tablet (20 mg total) by mouth daily. 30 tablet 5   hydrOXYzine (VISTARIL) 25 MG capsule TAKE 25MG S BY MOUTH UP TO TWO TIMES DAILY AS NEEDED FOR ANXIETY (Patient taking differently: Take 25 mg by mouth 2 (two) times daily as needed for anxiety.) 180 capsule 0   No facility-administered medications prior to visit.    ROS: Review of Systems  Constitutional:  Negative for activity change, appetite change, chills, fatigue and unexpected weight change.  HENT:  Negative for congestion, mouth sores and sinus pressure.   Eyes:  Negative for visual disturbance.  Respiratory:  Negative for cough and chest tightness.   Gastrointestinal:   Negative for abdominal pain and nausea.  Genitourinary:  Negative for difficulty urinating, frequency and vaginal pain.  Musculoskeletal:  Negative for back pain and gait problem.  Skin:  Negative for pallor and rash.  Neurological:  Negative for dizziness, tremors, weakness, numbness and headaches.  Psychiatric/Behavioral:  Positive for dysphoric mood. Negative for confusion, sleep disturbance and suicidal ideas. The patient is nervous/anxious.    Objective:  BP (!) 130/92 (BP Location: Left Arm)   Pulse 90   Temp 98.4 F (36.9 C) (Oral)   Ht 5\' 6"  (1.676 m)   Wt 267 lb 3.2 oz (121.2 kg)   LMP 05/01/2021   SpO2 97%   BMI 43.13 kg/m   BP Readings from Last 3 Encounters:  05/26/21 (!) 130/92  05/19/21 122/80  05/03/21 114/69    Wt Readings from Last 3 Encounters:  05/26/21 267 lb 3.2 oz (121.2 kg)  05/19/21 265 lb (120.2 kg)  05/03/21 264 lb 8.8 oz (120 kg)    Physical Exam Constitutional:      General: She is not in acute distress.    Appearance: She is well-developed. She is obese.  HENT:     Head: Normocephalic.     Right Ear: External ear normal.     Left Ear: External ear normal.     Nose: Nose normal.  Eyes:     General:        Right  eye: No discharge.        Left eye: No discharge.     Conjunctiva/sclera: Conjunctivae normal.     Pupils: Pupils are equal, round, and reactive to light.  Neck:     Thyroid: No thyromegaly.     Vascular: No JVD.     Trachea: No tracheal deviation.  Cardiovascular:     Rate and Rhythm: Normal rate and regular rhythm.     Heart sounds: Normal heart sounds.  Pulmonary:     Effort: No respiratory distress.     Breath sounds: No stridor. No wheezing.  Abdominal:     General: Bowel sounds are normal. There is no distension.     Palpations: Abdomen is soft. There is no mass.     Tenderness: There is no abdominal tenderness. There is no guarding or rebound.  Musculoskeletal:        General: No tenderness.     Cervical back:  Normal range of motion and neck supple. No rigidity.  Lymphadenopathy:     Cervical: No cervical adenopathy.  Skin:    Findings: No erythema or rash.  Neurological:     Cranial Nerves: No cranial nerve deficit.     Motor: No abnormal muscle tone.     Coordination: Coordination normal.     Deep Tendon Reflexes: Reflexes normal.  Psychiatric:        Behavior: Behavior normal.        Thought Content: Thought content normal.        Judgment: Judgment normal.    Lab Results  Component Value Date   WBC 10.4 05/03/2021   HGB 11.5 (L) 05/03/2021   HCT 36.0 05/03/2021   PLT 316 05/03/2021   GLUCOSE 104 (H) 05/03/2021   CHOL 140 04/09/2020   TRIG 97.0 04/09/2020   HDL 39.30 04/09/2020   LDLCALC 81 04/09/2020   ALT 6 05/03/2021   AST 8 (L) 05/03/2021   NA 137 05/03/2021   K 3.2 (L) 05/03/2021   CL 103 05/03/2021   CREATININE 0.65 05/03/2021   BUN 6 05/03/2021   CO2 24 05/03/2021   TSH 2.69 04/09/2020   HGBA1C 5.9 04/09/2020    US PELVIC COMPLETE W TRANSVAGINAL AND TORSION R/O  Result Date: 05/03/2021 CLINICAL DATA:  25 year old female with left lower quadrant pain for 3 days. Reports right oophorectomy. EXAM: TRANSABDOMINAL AND TRANSVAGINAL ULTRASOUND OF PELVIS DOPPLER ULTRASOUND OF OVARIES TECHNIQUE: Both transabdominal and transvaginal ultrasound examinations of the pelvis were performed. Transabdominal technique was performed for global imaging of the pelvis including uterus, ovaries, adnexal regions, and pelvic cul-de-sac. It was necessary to proceed with endovaginal exam following the transabdominal exam to visualize the left ovary. Color and duplex Doppler ultrasound was utilized to evaluate blood flow to the ovaries. COMPARISON:  Pelvis MRI 12/21/2020. CT Abdomen and Pelvis 03/02/2021. FINDINGS: Uterus Measurements: 9.9 x 5.5 x 6.2 cm = volume: 176 mL. Occasional small uterine fibroids, 3.3-3.6 cm diameter (image 9). Endometrium Thickness: 10 mm.  No focal abnormality  visualized. Right ovary Measurements: Surgically absent Left ovary Measurements: 3.6 x 3.6 x 3.4 cm = volume: 23 mL. Normal appearance/no adnexal mass. Pulsed Doppler evaluation demonstrates normal low-resistance arterial and venous waveforms in the left ovary. Other findings No free fluid. IMPRESSION: 1. Status post right oophorectomy. Normal left ovary with no evidence of torsion. 2. Small uterine fibroids, up to 3.6 cm diameter. Electronically Signed   By: Genevie Ann M.D.   On: 05/03/2021 06:58    Assessment & Plan:  Problem List Items Addressed This Visit     Anxiety and depression    Continue on Trintellix.  Doing well better      Relevant Medications   hydrOXYzine (VISTARIL) 25 MG capsule   Endometriosis    Doing better  She is seeing Dr. Nikki Dom at [facility] for her endometriosis.  She started to take Aygestin tablets.  So far doing well Off Nuvaring      PMS (premenstrual syndrome)    Doing better  -no pelvic pain so far She is seeing Dr. Nikki Dom at [facility] for her endometriosis.  She started to take Aygestin tablets.  So far doing well Off Nuvaring         Meds ordered this encounter  Medications   hydrOXYzine (VISTARIL) 25 MG capsule    Sig: Take 1 capsule (25 mg total) by mouth 2 (two) times daily as needed for anxiety.    Dispense:  180 capsule    Refill:  0      Follow-up: Return in about 2 months (around 07/27/2021) for a follow-up visit.  Walker Kehr, MD

## 2021-05-26 NOTE — Assessment & Plan Note (Addendum)
Doing better  She is seeing Dr. Nikki Dom at [facility] for her endometriosis.  She started to take Aygestin tablets.  So far doing well Off Nuvaring

## 2021-05-28 NOTE — Assessment & Plan Note (Signed)
Continue on Trintellix.  Doing well better

## 2021-05-28 NOTE — Assessment & Plan Note (Signed)
Doing better  -no pelvic pain so far She is seeing Dr. Nikki Dom at [facility] for her endometriosis.  She started to take Aygestin tablets.  So far doing well Off Nuvaring

## 2021-06-14 ENCOUNTER — Other Ambulatory Visit: Payer: Self-pay

## 2021-06-14 ENCOUNTER — Telehealth: Payer: Self-pay

## 2021-06-14 ENCOUNTER — Other Ambulatory Visit: Payer: Self-pay | Admitting: Internal Medicine

## 2021-06-14 MED ORDER — VORTIOXETINE HBR 20 MG PO TABS: 20.0000 mg | ORAL_TABLET | Freq: Every day | ORAL | 5 refills | Status: DC

## 2021-06-14 NOTE — Telephone Encounter (Signed)
Rx has been updated and sent into pharmacy.

## 2021-06-14 NOTE — Telephone Encounter (Signed)
Pt called in requesting 90 day supply refill for Trintellix 20 mg due to [employer] not approving 30 day supply [facility] stated it has to be 90 supply going forward.    Please call with update Home [phone]/Cell [phone].

## 2021-06-15 ENCOUNTER — Other Ambulatory Visit: Payer: Self-pay

## 2021-06-15 ENCOUNTER — Encounter: Payer: Self-pay | Admitting: Physical Therapy

## 2021-06-15 ENCOUNTER — Ambulatory Visit: Payer: BC Managed Care – PPO | Attending: Obstetrics and Gynecology | Admitting: Physical Therapy

## 2021-06-15 DIAGNOSIS — R278 Other lack of coordination: Secondary | ICD-10-CM | POA: Insufficient documentation

## 2021-06-15 DIAGNOSIS — R252 Cramp and spasm: Secondary | ICD-10-CM | POA: Insufficient documentation

## 2021-06-15 NOTE — Therapy (Signed)
[facility] @ [facility] [place], [place], [address] Phone: [phone]   Fax:  [phone]  Physical Therapy Evaluation  Patient Details  Name: Kendra Allen MRN: [id number] Date of Birth: August 25, 1995 Referring Provider (PT): Dr. Lonia Skinner   Encounter Date: 06/15/2021   PT End of Session - 06/15/21 1224     Visit Number 1    Date for PT Re-Evaluation 08/10/21    Authorization Type BCBS    PT Start Time 4193    PT Stop Time 7902    PT Time Calculation (min) 35 min    Activity Tolerance Patient tolerated treatment well    Behavior During Therapy [facility] for tasks assessed/performed             Past Medical History:  Diagnosis Date   Anemia    Anxiety    Chest pain    Constipation    Depression    Endometriosis of pelvis    Family history of breast cancer    Family history of colon cancer    Family history of ovarian cancer    Family history of prostate cancer    Hidradenitis suppurativa    PCOS (polycystic ovarian syndrome)    Prediabetes    Sleep apnea    Vitamin D deficiency     Past Surgical History:  Procedure Laterality Date   CERVICAL POLYPECTOMY     DILATATION & CURETTAGE/HYSTEROSCOPY WITH MYOSURE N/A 11/21/2016   Procedure: DILATATION & CURETTAGE/HYSTEROSCOPY WITH MYOSURE;  [name], MD;  Location: [facility];  Service: Gynecology;  [name];   ROBOTIC ASSISTED LAPAROSCOPIC OVARIAN CYSTECTOMY N/A 12/30/2020   Procedure: XI ROBOTIC ASSISTED LAPAROSCOPIC RIGHT SALPINGO-OOPHORECTOMY WITH PELVIC WASHINGS;  [name], MD;  [name];  Service: Gynecology;  [name];   WISDOM TOOTH EXTRACTION      There were no vitals filed for this visit.    Subjective Assessment - 06/15/21 1151     Subjective Patient has endometriosis and was referred from clinic. July had right ovary removed and since then has had bad cramping. Patient is taking  a medication that is preventing her from having a cycle and since then the pain has been better.    Patient Stated Goals work on pain and management of endometiriosis    Currently in Pain? Yes    Pain Score 5     Pain Location Pelvis    Pain Orientation Lower    Pain Descriptors / Indicators Sharp    Pain Type Chronic pain    Pain Onset More than a month ago    Pain Frequency Intermittent    Aggravating Factors  pain comes on randomly    Pain Relieving Factors not sure    Multiple Pain Sites No                [facility] PT Assessment - 06/15/21 0001       Assessment   Medical Diagnosis M62.89 Pelvic floor Tension    Referring Provider (PT) Dr. Lonia Skinner    Onset Date/Surgical Date 12/30/20    Prior Therapy none      Precautions   Precautions None      Restrictions   Weight Bearing Restrictions No      Balance Screen   Has the patient fallen in the past 6 months No    Has the patient had a decrease in activity level because of a fear of falling?  No    Is the patient reluctant to leave their home because of a fear of falling?  No      Home Ecologist residence      Prior Function   Level of Independence Independent    Vocation Requirements quit work due to what she is going through medically    Leisure no exercise      Cognition   Overall Cognitive Status Within Functional Limits for tasks assessed      Observation/Other Assessments   Skin Integrity scars are healed with decreased mobiltiy      Posture/Postural Control   Posture/Postural Control No significant limitations      ROM / Strength   AROM / PROM / Strength AROM;PROM;Strength      AROM   Overall AROM Comments lumbar ROM is full      PROM   Right Hip External Rotation  40    Left Hip External Rotation  40      Strength   Overall Strength Comments contract the upper abdominals more than the lower so the lower bulges    Right Hip Extension 4/5    Right  Hip ADduction 4/5    Left Hip Extension 4/5    Left Hip ADduction 4/5      Palpation   SI assessment  ASIS are equal    Palpation comment tenderness located in the lower abdominal and firmness; Tightness in the lumbar, gluteal and hamstring                        Objective measurements completed on examination: See above findings.     Pelvic Floor Special Questions - 06/15/21 0001     Prior Pregnancies No    Currently Sexually Active No   been over 2 years   Urinary Leakage No    Urinary urgency Yes    Urinary frequency urinates more frequently with after she drinks something she has to use the rest room in 30 minutes    Fecal incontinence No   no consitpation   Falling out feeling (prolapse) No    Exam Type Deferred   not comfortable with this             [facility] Adult PT Treatment/Exercise - 06/15/21 0001       Self-Care   Self-Care Other Self-Care Comments    Other Self-Care Comments  gave patient 2 you tube videos to do for pelvic floor relaxation and references to Nancys nook to look up information on endometriosis.      Lumbar Exercises: Stretches   Other Lumbar Stretch Exercise happy baby holding for 30 seconds; cat camel 10x, and childs pose holding for 30 minutes                     PT Education - 06/15/21 1224     Education Details Access Code: [email or web address]; information on you tube videos for pelvic floor relaxation and nancys nook for endo informaion    Person(s) Educated Patient    Methods Explanation;Demonstration;Handout    Comprehension Verbalized understanding;Returned demonstration              PT Short Term Goals - 06/15/21 1354       PT SHORT TERM GOAL #1   Title independent with flexibility program for hips and back    Time 4    Period Weeks    Status New    Target  Date 07/13/21               PT Long Term Goals - 06/15/21 1354       PT LONG TERM GOAL #1   Title independent with advanced HEP for core and  hip strength    Time 8    Period Weeks    Status New    Target Date 08/10/21      PT LONG TERM GOAL #2   Title understand ways to manage pain that may occur with endometriosis    Time 8    Period Weeks    Status New    Target Date 08/10/21      PT LONG TERM GOAL #3   Title able to urinate every 3 hours due to increased relaxation of the pelvic floor muscles    Time 8    Period Weeks    Status New    Target Date 08/10/21                    Plan - 06/15/21 1225     Clinical Impression Statement Patient is a 25 year old female with endometriosis and PCOS. She recently had surgery on 12/2020 to remove fibroids and cysts on ovaries. Patient has recently found out she has endometriosis and is dealing alot with this emotionally. Patient deferred internal assessment of the pelvic floor. Patient reports since she is taking a new medicine her pain is reduced alot and happens occasionally. Patient bilateral hip external rotation is limited. She has weakness in bilateral hip adduction and extension. She will bulge her lower abdomen when contracting her abdominals. She has pain and tightness in the lower abdominal region. Her abdominal scars are limited. Patient has tightness in the lumbar, gluteal and hamstring. Patient reports she will urinate 30 minutes after she drinks something. Patient would benefit from skilled therapy to learn how to manage endometriosis, ways to exericse and correct diaphragmatic breathing to relax the pelvic floor.    Personal Factors and Comorbidities Fitness;Comorbidity 3+    Comorbidities PCOS; endometriosis; Removal of fibroids    Examination-Activity Limitations Toileting    Stability/Clinical Decision Making Stable/Uncomplicated    Clinical Decision Making Low    Rehab Potential Excellent    PT Frequency 1x / week    PT Duration 8 weeks    PT Treatment/Interventions ADLs/Self Care Home Management;Biofeedback;Therapeutic activities;Therapeutic  exercise;Neuromuscular re-education;Patient/family education;Manual techniques;Taping    PT Next Visit Plan education on endometriosis; medtation; diaphragmatic breathing ;adominal massage for the lower abdomen, scar massage, massage pelvic floor with a ball    PT Home Exercise Plan Access Code: [email or web address]    Consulted and Agree with Plan of Care Patient             Patient will benefit from skilled therapeutic intervention in order to improve the following deficits and impairments:  Decreased coordination, Increased fascial restricitons, Pain, Decreased strength, Decreased scar mobility  Visit Diagnosis: Cramp and spasm - Plan: PT plan of care cert/re-cert  Other lack of coordination - Plan: PT plan of care cert/re-cert     Problem List Patient Active Problem List   Diagnosis Date Noted   Endometriosis    Subserous leiomyoma of uterus    Anovulatory (dysfunctional uterine) bleeding 12/14/2020   Cyst of right ovary 12/14/2020   Cough 09/28/2020   Genetic testing 07/02/2020   Family history of breast cancer    Family history of colon cancer    Family history of prostate cancer  Family history of ovarian cancer    Prediabetes    Well adult exam 04/12/2020   Snorings 04/12/2020   PCOS (polycystic ovarian syndrome) 12/04/2019   Vitamin D deficiency 03/31/2019   Hyperglycemia 03/31/2019   Anemia 01/21/2018   Midsternal chest pain 11/26/2015   Pain in joint, ankle and foot 03/30/2015   Apathy 03/26/2014   Headache 03/26/2014   PMS (premenstrual syndrome) 01/26/2014   Anxiety and depression 01/26/2014   Fever and chills 09/10/2013   Fatigue 09/10/2013   Anxiety attack 06/09/2013   Unspecified constipation 04/02/2013   Diarrhea 04/02/2013   Allergic rhinitis 02/01/2012   Otitis media 12/12/2011   Obesity 03/30/2010   Hidradenitis 12/02/2009    Earlie Counts, PT 06/15/21 1:58 PM  [facility] @ [place], [place], [address] Phone: [phone]   Fax:  [phone]  Name: Kendra Allen MRN: [id number] Date of Birth: [birth year]

## 2021-06-15 NOTE — Patient Instructions (Signed)
Access Code: [email or web address] URL: [email or web address] Date: 06/15/2021 Prepared by: Earlie Counts  Program Notes Parthenia Ames on facebook page [email or web address]    Exercises Cat Cow - 1 x daily - 3 x weekly - 1 sets - 10 reps Child's Pose Stretch - 1 x daily - 3 x weekly - 1 sets - 2 reps - 30 sec hold Supine Pelvic Floor Stretch - 1 x daily - 3 x weekly - 1 sets - 1 reps - 30 sec hold [facility] [address] [place], [place] [address] Phone # [phone] Fax [phone]

## 2021-06-20 ENCOUNTER — Other Ambulatory Visit: Payer: Self-pay | Admitting: Internal Medicine

## 2021-06-27 ENCOUNTER — Ambulatory Visit: Payer: BC Managed Care – PPO | Admitting: Internal Medicine

## 2021-06-28 ENCOUNTER — Ambulatory Visit: Payer: BC Managed Care – PPO | Admitting: Psychology

## 2021-06-28 DIAGNOSIS — F331 Major depressive disorder, recurrent, moderate: Secondary | ICD-10-CM

## 2021-06-28 NOTE — Progress Notes (Signed)
[facility] Counsel[facility] Initial Adult Exam  Name: Kendra Allen Date: 06/28/2021 MRN: [id number] DOB: 07/29/1995 PCP: Cassandria Anger, MD  Time spent: 3:00pm-3:50pm   50 minutes  Guardian/Payee:  Crista Curb requested: No   Reason for Visit /Presenting Problem:  Pt present for face-to-face initial assessment via video Webex.  Pt consents to telehealth video session due to COVID 19 pandemic. Location of pt: home Location of therapist: home office.  Pt states she has had a hard time.  She has had symptoms of anxiety and depression.   Pt has felt so anxious at times that she feels nauseous.  Pt feels sad and tearful at times.   In July pt had to have her right ovary removed.  Pt also had endometriosis and had a lot of pain.   She had to quit her job in Sept. 2022. She was working as a Education officer, museum.  Pt graduated from [employer] in 2020.  Pt now has less pain and is doing better physically. Pt lives with her parents.  Pt has lived with her parents since finishing college.  Pt is alone during the day since she is not working.  Pt often doesn't feel like getting out of bed.   Pt has a psychiatrist Jobie Quaker and is prescribed trintelix and volara.  Pt takes anxiety medicine hydroxixine at night to help her sleep.   Pt spends her time watching tv, reading,  Pt has a goal to get out of the house each day.   Pt tends to overthink things and talk herself out of things.    Mental Status Exam: Appearance:   Casual     Behavior:  Appropriate  Motor:  Normal  Speech/Language:   Normal Rate  Affect:  Appropriate  Mood:  normal  Thought process:  normal  Thought content:    WNL  Sensory/Perceptual disturbances:    WNL  Orientation:  oriented to person, place, time/date, and situation  Attention:  Good  Concentration:  Good  Memory:  WNL  Fund of knowledge:   Good  Insight:    Good  Judgment:   Good  Impulse Control:  Good   Reported Symptoms:  sadness,  tearfulness, worrying, anxiety  Risk Assessment: Danger to Self:  No Self-injurious Behavior: No Danger to Others: No Duty to Warn:no Physical Aggression / Violence:No  Access to Firearms a concern: No  Gang Involvement:No  Patient / guardian was educated about steps to take if suicide or homicide risk level increases between visits: no While future psychiatric events cannot be accurately predicted, the patient does not currently require acute inpatient psychiatric care and does not currently meet [facility] involuntary commitment criteria.  Substance Abuse History: Current substance abuse: No     Past Psychiatric History:   Previous psychological history is significant for anxiety and depression Outpatient Providers:[facility] program History of Psych Hospitalization: No  Psychological Testing:  n/a    Abuse History:  Victim of: No.,  n/a    Report needed: No. Victim of Neglect:No. Perpetrator of  n/a   Witness / Exposure to Domestic Violence: No   Protective Services Involvement: No  Witness to [employer] Violence:  No   Family History:  Family History  Problem Relation Age of Onset   Hypertension Mother    Depression Mother    Obesity Mother    Sleep apnea Father    Obesity Father    Colon cancer Maternal Uncle 54   Diabetes Paternal  Aunt    Heart attack Maternal Grandmother    Prostate cancer Maternal Grandfather        dx 51s, metastatic   Stroke Paternal Grandfather    Breast cancer Half-Sister 85   Ovarian cancer Maternal Great-grandmother        dx ?, MGF's mother   Breast cancer Other        multiple of mother's paternal cousins   Ovarian cancer Other        multiple of mother's paternal cousins   Prostate cancer Other        multiple of mother's paternal cousins   Endometrial cancer Neg Hx    Pancreatic cancer Neg Hx     Living situation: The patient lives with her parents. Pt has an older brother and sister who live on their own.   Family history of  mental health issues: none known.  No family history of substance abuse.   Sexual Orientation: Straight  Relationship Status: single  Name of spouse / other:n/a If a parent, number of children / ages:n/a  Support Systems: friends parents  Financial Stress:  No   Income/Employment/Disability: Supported by [employer]. 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  She was able to do well and go to work every day until she started to have medical issues this past fall and decided to quit work.   Military Service: No   Educational History: Education: Scientist, product/process development: Protestant  Any cultural differences that may affect / interfere with treatment:  not applicable   Recreation/Hobbies: Pt does not currently have any hobbies.    Stressors: Other: Pt states her main stressors are her health and being around people for a long period of time.     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  Pt states she has healthy relationships.  Barriers:  none noted.   Legal History: Pending legal issue / charges: The patient has no significant history of legal issues. History of legal issue / charges:  none  Medical History/Surgical History: reviewed Past Medical History:  Diagnosis Date   Anemia    Anxiety    Chest pain    Constipation    Depression    Endometriosis of pelvis    Family history of breast cancer    Family history of colon cancer    Family history of ovarian cancer    Family history of prostate cancer    Hidradenitis suppurativa    PCOS (polycystic ovarian syndrome)    Prediabetes    Sleep apnea    Vitamin D deficiency     Past Surgical History:  Procedure Laterality Date   CERVICAL POLYPECTOMY     DILATATION & CURETTAGE/HYSTEROSCOPY WITH MYOSURE N/A 11/21/2016   Procedure: DILATATION & CURETTAGE/HYSTEROSCOPY WITH MYOSURE;  [name], MD;  Location:  [facility];  Service: Gynecology;  [name];   ROBOTIC ASSISTED LAPAROSCOPIC OVARIAN CYSTECTOMY N/A 12/30/2020   Procedure: XI ROBOTIC ASSISTED LAPAROSCOPIC RIGHT SALPINGO-OOPHORECTOMY WITH PELVIC WASHINGS;  [name], MD;  [name];  Service: Gynecology;  [name];   WISDOM TOOTH EXTRACTION      Medications: Current Outpatient Medications  Medication Sig Dispense Refill   acetaminophen-codeine (TYLENOL #4) 300-60 MG tablet Take 1 tablet by mouth every 4 (four) hours as needed for severe pain. 20 tablet 1   clindamycin (CLEOCIN T) 1 % external solution Apply 1 application topically  as needed (hidradenitis suppurativa flare). 30 mL 1   hydrOXYzine (VISTARIL) 25 MG capsule Take 1 capsule (25 mg total) by mouth 2 (two) times daily as needed for anxiety. 180 capsule 0   ibuprofen (ADVIL) 600 MG tablet Take 1 tablet (600 mg total) by mouth every 6 (six) hours as needed. 30 tablet 0   norethindrone (AYGESTIN) 5 MG tablet Take 2 tablets by mouth daily.     ondansetron (ZOFRAN) 4 MG tablet Take 1 tablet (4 mg total) by mouth every 8 (eight) hours as needed for nausea or vomiting. 21 tablet 1   promethazine (PHENERGAN) 25 MG tablet TAKE 1 TABLET BY MOUTH EVERY 4 HOURS AS NEEDED FOR NAUSEA 6 tablet 1   spironolactone (ALDACTONE) 50 MG tablet Take 50 mg by mouth daily.     vortioxetine HBr (TRINTELLIX) 20 MG TABS tablet Take 1 tablet (20 mg total) by mouth daily. 90 tablet 5   No current facility-administered medications for this visit.    Allergies  Allergen Reactions   Doxycycline     Upset stomach   Sulfamethoxazole-Trimethoprim     upset stomach    Diagnoses:  F33.1  Plan of Care: Pt participated in setting treatment goals.   She wants to improve coping skills and be more functional in day to day activities.  Pt wants to identify career goals.  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  Pt wants to enjoy things more.  Plan to meet every two weeks.     Treatment Plan  (treatment plan target date: 06/28/2022). Client Abilities/Strengths  Pt is bright and motivated for therapy. Client Treatment Preferences  Individual therapy.  Client Statement of Needs  Improve coping skills.  Symptoms  Depressed or irritable mood.  Diminished interest in or enjoyment of activities.  Problems Addressed  Unipolar Depression Goals 1. Alleviate depressive symptoms and return to previous level of effective functioning. 2. Appropriately grieve the loss in order to normalize mood and to return to previously adaptive level of functioning. Objective Learn and implement behavioral strategies to overcome depression. Target Date: 2022-06-28 Frequency: Biweekly  Progress: 10 Modality: individual  Related Interventions Engage the client in "behavioral activation," increasing his/her activity level and contact with sources of reward, while identifying processes that inhibit activation.  Use behavioral techniques such as instruction, rehearsal, role-playing, role reversal, as needed, to facilitate activity in the client's daily life; reinforce success. Assist the client in developing skills that increase the likelihood of deriving pleasure from behavioral activation (e.g., assertiveness skills, developing an exercise plan, less internal/more external focus, increased social involvement); reinforce success. Objective Identify important people in life, past and present, and describe the quality, good and poor, of those relationships. Target Date: 2022-06-28 Frequency: Biweekly  Progress: 10 Modality: individual  Related Interventions Conduct Interpersonal Therapy beginning with the assessment of the client's "interpersonal inventory" of important past and present relationships; develop a case formulation linking depression to grief, interpersonal role disputes, role transitions, and/or interpersonal deficits). Objective Learn and implement problem-solving and  decision-making skills. Target Date: 2022-06-28 Frequency: Biweekly  Progress: 10 Modality: individual  Related Interventions Conduct Problem-Solving Therapy using techniques such as psychoeducation, modeling, and role-playing to teach client problem-solving skills (i.e., defining a problem specifically, generating possible solutions, evaluating the pros and cons of each solution, selecting and implementing a plan of action, evaluating the efficacy of the plan, accepting or revising the plan); role-play application of the problem-solving skill to a real life issue. Encourage in the client  the development of a positive problem orientation in which problems and solving them are viewed as a natural part of life and not something to be feared, despaired, or avoided. 3. Develop healthy interpersonal relationships that lead to the alleviation and help prevent the relapse of depression. 4. Develop healthy thinking patterns and beliefs about self, others, and the world that lead to the alleviation and help prevent the relapse of depression. 5. Recognize, accept, and cope with feelings of depression. Diagnosis F33.1  Conditions For Discharge Achievement of treatment goals and objectives   Clint Bolder, LCSW

## 2021-06-29 ENCOUNTER — Encounter: Payer: Self-pay | Admitting: Physical Therapy

## 2021-06-29 ENCOUNTER — Ambulatory Visit: Payer: BC Managed Care – PPO | Attending: Obstetrics and Gynecology | Admitting: Physical Therapy

## 2021-06-29 ENCOUNTER — Other Ambulatory Visit: Payer: Self-pay

## 2021-06-29 DIAGNOSIS — R252 Cramp and spasm: Secondary | ICD-10-CM | POA: Insufficient documentation

## 2021-06-29 DIAGNOSIS — R278 Other lack of coordination: Secondary | ICD-10-CM | POA: Diagnosis present

## 2021-06-29 NOTE — Therapy (Signed)
[facility] @ [address] [place], [place], [address] Phone: [phone]   Fax:  [phone]  Physical Therapy Treatment  Patient Details  Name: Kendra Allen MRN: [id number] Date of Birth: 05-31-1996 Referring Provider (PT): Dr. Lonia Skinner   Encounter Date: 06/29/2021   PT End of Session - 06/29/21 1142     Visit Number 2    Date for PT Re-Evaluation 08/10/21    Authorization Type BCBS    PT Start Time 1100    PT Stop Time 1140    PT Time Calculation (min) 40 min    Activity Tolerance Patient tolerated treatment well    Behavior During Therapy [facility] for tasks assessed/performed             Past Medical History:  Diagnosis Date   Anemia    Anxiety    Chest pain    Constipation    Depression    Endometriosis of pelvis    Family history of breast cancer    Family history of colon cancer    Family history of ovarian cancer    Family history of prostate cancer    Hidradenitis suppurativa    PCOS (polycystic ovarian syndrome)    Prediabetes    Sleep apnea    Vitamin D deficiency     Past Surgical History:  Procedure Laterality Date   CERVICAL POLYPECTOMY     DILATATION & CURETTAGE/HYSTEROSCOPY WITH MYOSURE N/A 11/21/2016   Procedure: DILATATION & CURETTAGE/HYSTEROSCOPY WITH MYOSURE;  [name], MD;  Location: [facility];  Service: Gynecology;  [name];   ROBOTIC ASSISTED LAPAROSCOPIC OVARIAN CYSTECTOMY N/A 12/30/2020   Procedure: XI ROBOTIC ASSISTED LAPAROSCOPIC RIGHT SALPINGO-OOPHORECTOMY WITH PELVIC WASHINGS;  [name], MD;  [name];  Service: Gynecology;  [name];   WISDOM TOOTH EXTRACTION      There were no vitals filed for this visit.   Subjective Assessment - 06/29/21 1105     Subjective I have been doing the stretches and you tube video for pelvic floor tension release.    Patient Stated Goals work on pain and management of  endometiriosis    Currently in Pain? Yes    Pain Score 2     Pain Location Pelvis    Pain Orientation Lower    Pain Descriptors / Indicators Dull    Pain Type Chronic pain    Pain Onset More than a month ago    Pain Frequency Intermittent    Aggravating Factors  pain comes on randomly    Pain Relieving Factors not sure    Multiple Pain Sites No                               [facility] Adult PT Treatment/Exercise - 06/29/21 0001       Self-Care   Self-Care Other Self-Care Comments    Other Self-Care Comments  sitting on massage ball to massage the pelvic floor muscles and the muscles on the foot to relax the muscles; educated pateint  on pelvic meditations that are on you tube      Manual Therapy   Manual Therapy Myofascial release    Myofascial Release suprapubic fascial release, along the mesenteric root, along the left inguianl canal, along the pubovesical ligament and around the umbilicus going through the restrictions and looking at patients pain level  PT Education - 06/29/21 1141     Education Details eduation on meditation you tube videos; using a prickly ball to the pelvic floor muscles    Person(s) Educated Patient    Methods Explanation;Handout    Comprehension Verbalized understanding              PT Short Term Goals - 06/29/21 1146       PT SHORT TERM GOAL #1   Title independent with flexibility program for hips and back    Time 4    Period Weeks    Status Achieved               PT Long Term Goals - 06/15/21 1354       PT LONG TERM GOAL #1   Title independent with advanced HEP for core and hip strength    Time 8    Period Weeks    Status New    Target Date 08/10/21      PT LONG TERM GOAL #2   Title understand ways to manage pain that may occur with endometriosis    Time 8    Period Weeks    Status New    Target Date 08/10/21      PT LONG TERM GOAL #3   Title able to urinate every 3  hours due to increased relaxation of the pelvic floor muscles    Time 8    Period Weeks    Status New    Target Date 08/10/21                   Plan - 06/29/21 1142     Clinical Impression Statement Patient is able to relax in therapy better. She is seeing someone for mental health which is helping her. Patient had increased fascial restrictions in the abdomen and umbilicus that still need to be released. Patient understands how to use a massage ball to work on the pelvic floor. She is doing her HEP. Patient just started so she has not met goals yet. Patient will benefit from skilled therapy to learn how to manage endometriosis, ways to exercise and correct diaphragmatic breathing to relax the pelvic floor.    Personal Factors and Comorbidities Fitness;Comorbidity 3+    Comorbidities PCOS; endometriosis; Removal of fibroids    Examination-Activity Limitations Toileting    Stability/Clinical Decision Making Stable/Uncomplicated    Rehab Potential Excellent    PT Frequency 1x / week    PT Duration 8 weeks    PT Treatment/Interventions ADLs/Self Care Home Management;Biofeedback;Therapeutic activities;Therapeutic exercise;Neuromuscular re-education;Patient/family education;Manual techniques;Taping    PT Next Visit Plan education on endometriosis;diaphragmatic breathing ;adominal massage for the lower abdomen; hip  mobilization for ER and extension, work on abdominal contraction    PT Home Exercise Plan Access Code: [email or web address]    Consulted and Agree with Plan of Care Patient             Patient will benefit from skilled therapeutic intervention in order to improve the following deficits and impairments:  Decreased coordination, Increased fascial restricitons, Pain, Decreased strength, Decreased scar mobility  Visit Diagnosis: Cramp and spasm  Other lack of coordination     Problem List Patient Active Problem List   Diagnosis Date Noted   Endometriosis    Subserous  leiomyoma of uterus    Anovulatory (dysfunctional uterine) bleeding 12/14/2020   Cyst of right ovary 12/14/2020   Cough 09/28/2020   Genetic testing 07/02/2020   Family history of breast cancer  Family history of colon cancer    Family history of prostate cancer    Family history of ovarian cancer    Prediabetes    Well adult exam 04/12/2020   Snorings 04/12/2020   PCOS (polycystic ovarian syndrome) 12/04/2019   Vitamin D deficiency 03/31/2019   Hyperglycemia 03/31/2019   Anemia 01/21/2018   Midsternal chest pain 11/26/2015   Pain in joint, ankle and foot 03/30/2015   Apathy 03/26/2014   Headache 03/26/2014   PMS (premenstrual syndrome) 01/26/2014   Anxiety and depression 01/26/2014   Fever and chills 09/10/2013   Fatigue 09/10/2013   Anxiety attack 06/09/2013   Unspecified constipation 04/02/2013   Diarrhea 04/02/2013   Allergic rhinitis 02/01/2012   Otitis media 12/12/2011   Obesity 03/30/2010   Hidradenitis 12/02/2009    Earlie Counts, PT 06/29/21 11:47 AM  [facility] @ [place] [place], [place], [address] Phone: [phone]   Fax:  [phone]  Name: Kendra Allen MRN: [id number] Date of Birth: [birth year]

## 2021-07-06 ENCOUNTER — Encounter: Payer: Self-pay | Admitting: Physical Therapy

## 2021-07-06 ENCOUNTER — Ambulatory Visit: Payer: BC Managed Care – PPO | Admitting: Physical Therapy

## 2021-07-06 ENCOUNTER — Ambulatory Visit: Payer: BC Managed Care – PPO | Admitting: Internal Medicine

## 2021-07-06 ENCOUNTER — Other Ambulatory Visit: Payer: Self-pay

## 2021-07-06 ENCOUNTER — Encounter: Payer: Self-pay | Admitting: Internal Medicine

## 2021-07-06 DIAGNOSIS — L600 Ingrowing nail: Secondary | ICD-10-CM

## 2021-07-06 DIAGNOSIS — R252 Cramp and spasm: Secondary | ICD-10-CM | POA: Diagnosis not present

## 2021-07-06 DIAGNOSIS — F419 Anxiety disorder, unspecified: Secondary | ICD-10-CM | POA: Diagnosis not present

## 2021-07-06 DIAGNOSIS — R278 Other lack of coordination: Secondary | ICD-10-CM

## 2021-07-06 DIAGNOSIS — F32A Depression, unspecified: Secondary | ICD-10-CM

## 2021-07-06 MED ORDER — CEPHALEXIN 500 MG PO CAPS: 500.0000 mg | ORAL_CAPSULE | Freq: Four times a day (QID) | ORAL | 1 refills | Status: DC

## 2021-07-06 MED ORDER — MUPIROCIN 2 % EX OINT: TOPICAL_OINTMENT | CUTANEOUS | 0 refills | Status: DC

## 2021-07-06 NOTE — Patient Instructions (Signed)
Spenco inserts

## 2021-07-06 NOTE — Therapy (Signed)
[facility] @ [address] [place], [place], [address] Phone: [phone]   Fax:  [phone]  Physical Therapy Treatment  Patient Details  Name: Kendra Allen MRN: [id number] Date of Birth: [birth year] Referring Provider (PT): Dr. Lonia Skinner   Encounter Date: 07/06/2021   PT End of Session - 07/06/21 1525     Visit Number 3    Date for PT Re-Evaluation 08/10/21    Authorization Type BCBS    PT Start Time 1445    PT Stop Time 5035    PT Time Calculation (min) 38 min    Activity Tolerance Patient tolerated treatment well    Behavior During Therapy [facility] for tasks assessed/performed             Past Medical History:  Diagnosis Date   Anemia    Anxiety    Chest pain    Constipation    Depression    Endometriosis of pelvis    Family history of breast cancer    Family history of colon cancer    Family history of ovarian cancer    Family history of prostate cancer    Hidradenitis suppurativa    PCOS (polycystic ovarian syndrome)    Prediabetes    Sleep apnea    Vitamin D deficiency     Past Surgical History:  Procedure Laterality Date   CERVICAL POLYPECTOMY     DILATATION & CURETTAGE/HYSTEROSCOPY WITH MYOSURE N/A 11/21/2016   Procedure: DILATATION & CURETTAGE/HYSTEROSCOPY WITH MYOSURE;  [name], MD;  Location: [facility];  Service: Gynecology;  [name];   ROBOTIC ASSISTED LAPAROSCOPIC OVARIAN CYSTECTOMY N/A 12/30/2020   Procedure: XI ROBOTIC ASSISTED LAPAROSCOPIC RIGHT SALPINGO-OOPHORECTOMY WITH PELVIC WASHINGS;  [name], MD;  [name];  Service: Gynecology;  [name];   WISDOM TOOTH EXTRACTION      There were no vitals filed for this visit.   Subjective Assessment - 07/06/21 1450     Subjective I am not getting the cramping right now.    Patient Stated Goals work on pain and management of endometiriosis    Currently in Pain? No/denies     Multiple Pain Sites No                [facility] PT Assessment - 07/06/21 0001       Assessment   Medical Diagnosis M62.89 Pelvic floor Tension    Referring Provider (PT) Dr. Lonia Skinner    Onset Date/Surgical Date 12/30/20    Prior Therapy none      Precautions   Precautions None      Restrictions   Weight Bearing Restrictions No      Home Environment   Living Environment Private residence      Prior Function   Level of Independence Independent    Vocation Requirements quit work due to what she is going through medically    Leisure no exercise      Cognition   Overall Cognitive Status Within Functional Limits for tasks assessed      Observation/Other Assessments   Skin Integrity scars are healed with decreased mobiltiy      ROM / Strength   AROM / PROM / Strength AROM;PROM;Strength      AROM   Overall AROM Comments lumbar ROM is full      PROM   Right Hip External Rotation  50    Left Hip External Rotation  55  Strength   Right Hip Extension 5/5    Right Hip ADduction 5/5    Left Hip Extension 5/5    Left Hip ADduction 5/5      Palpation   SI assessment  ASIS are equal                           [facility] Adult PT Treatment/Exercise - 07/06/21 0001       Manual Therapy   Manual Therapy Myofascial release    Manual therapy comments educated patient on scar massage and lower abdominal massage to improve tissue mobility    Myofascial Release fascial release along the suprapubic area, along the inguinal canal rerleased, left sidely with therapist stabilize the right lower abdominal area and move the right leg into abduction and extension moving through restrictions                     PT Education - 07/06/21 1525     Education Details educated pateitn on scar massage and lower abdominal massage    Person(s) Educated Patient    Methods Explanation;Demonstration;Handout    Comprehension Returned  demonstration;Verbalized understanding              PT Short Term Goals - 06/29/21 1146       PT SHORT TERM GOAL #1   Title independent with flexibility program for hips and back    Time 4    Period Weeks    Status Achieved               PT Long Term Goals - 07/06/21 1528       PT LONG TERM GOAL #1   Title independent with advanced HEP for core and hip strength    Time 8    Period Weeks    Status Achieved      PT LONG TERM GOAL #2   Title understand ways to manage pain that may occur with endometriosis    Time 8    Period Weeks    Status Achieved      PT LONG TERM GOAL #3   Title able to urinate every 3 hours due to increased relaxation of the pelvic floor muscles    Time 8    Period Weeks    Status Achieved                   Plan - 07/06/21 1526     Clinical Impression Statement Patient is not having pain. She is independent with meditation, manual work to abdomen, scar massage . She is able to urinate every 3 hours. She has started an aquatic program. Patient feels she has all the tools she needs right now and is happy with her progress.    Personal Factors and Comorbidities Fitness;Comorbidity 3+    Comorbidities PCOS; endometriosis; Removal of fibroids    Examination-Activity Limitations Toileting    Stability/Clinical Decision Making Stable/Uncomplicated    Rehab Potential Excellent    PT Treatment/Interventions ADLs/Self Care Home Management;Biofeedback;Therapeutic activities;Therapeutic exercise;Neuromuscular re-education;Patient/family education;Manual techniques;Taping    PT Next Visit Plan Discharge to HEP    PT Home Exercise Plan Access Code: [email or web address]    Consulted and Agree with Plan of Care Patient             Patient will benefit from skilled therapeutic intervention in order to improve the following deficits and impairments:  Decreased coordination, Increased fascial restricitons, Pain, Decreased strength, Decreased scar  mobility  Visit Diagnosis: Cramp and spasm  Other lack of coordination     Problem List Patient Active Problem List   Diagnosis Date Noted   Endometriosis    Subserous leiomyoma of uterus    Anovulatory (dysfunctional uterine) bleeding 12/14/2020   Cyst of right ovary 12/14/2020   Cough 09/28/2020   Genetic testing 07/02/2020   Family history of breast cancer    Family history of colon cancer    Family history of prostate cancer    Family history of ovarian cancer    Prediabetes    Well adult exam 04/12/2020   Snorings 04/12/2020   PCOS (polycystic ovarian syndrome) 12/04/2019   Vitamin D deficiency 03/31/2019   Hyperglycemia 03/31/2019   Anemia 01/21/2018   Midsternal chest pain 11/26/2015   Pain in joint, ankle and foot 03/30/2015   Apathy 03/26/2014   Headache 03/26/2014   PMS (premenstrual syndrome) 01/26/2014   Anxiety and depression 01/26/2014   Fever and chills 09/10/2013   Fatigue 09/10/2013   Anxiety attack 06/09/2013   Unspecified constipation 04/02/2013   Diarrhea 04/02/2013   Allergic rhinitis 02/01/2012   Otitis media 12/12/2011   Obesity 03/30/2010   Hidradenitis 12/02/2009    Earlie Counts, PT 07/06/21 3:30 PM  [facility] @ [address] [place], [place], [address] Phone: [phone]   Fax:  [phone]  Name: BRENNA FRIESENHAHN MRN: [id number] Date of Birth: 11/12/1995 PHYSICAL THERAPY DISCHARGE SUMMARY  Visits from Start of Care: 3  Current functional level related to goals / functional outcomes: See above.    Remaining deficits: See above.    Education / Equipment: HEP   Patient agrees to discharge. Patient goals were met. Patient is being discharged due to meeting the stated rehab goals. Thank you for the referral. Earlie Counts, PT 07/06/21 3:30 PM

## 2021-07-06 NOTE — Progress Notes (Signed)
Subjective:  Patient ID: Kendra Allen, female    DOB: Sep 15, 1995  Age: 26 y.o. MRN: [id number]  CC: Office Visit (Ingrown toenail)   HPI Kendra Allen presents for ingrown toenail pain R 2nd toe.  Follow-up on depression  Outpatient Medications Prior to Visit  Medication Sig Dispense Refill   acetaminophen-codeine (TYLENOL #4) 300-60 MG tablet Take 1 tablet by mouth every 4 (four) hours as needed for severe pain. 20 tablet 1   clindamycin (CLEOCIN T) 1 % external solution Apply 1 application topically as needed (hidradenitis suppurativa flare). 30 mL 1   hydrOXYzine (VISTARIL) 25 MG capsule Take 1 capsule (25 mg total) by mouth 2 (two) times daily as needed for anxiety. 180 capsule 0   ibuprofen (ADVIL) 600 MG tablet Take 1 tablet (600 mg total) by mouth every 6 (six) hours as needed. 30 tablet 0   norethindrone (AYGESTIN) 5 MG tablet Take 2 tablets by mouth daily.     ondansetron (ZOFRAN) 4 MG tablet Take 1 tablet (4 mg total) by mouth every 8 (eight) hours as needed for nausea or vomiting. 21 tablet 1   promethazine (PHENERGAN) 25 MG tablet TAKE 1 TABLET BY MOUTH EVERY 4 HOURS AS NEEDED FOR NAUSEA 6 tablet 1   TRINTELLIX 10 MG TABS tablet Take 10 mg by mouth daily.     VRAYLAR 1.5 MG capsule Take 1.5 mg by mouth.     spironolactone (ALDACTONE) 50 MG tablet Take 50 mg by mouth daily.     vortioxetine HBr (TRINTELLIX) 20 MG TABS tablet Take 1 tablet (20 mg total) by mouth daily. 90 tablet 5   No facility-administered medications prior to visit.    ROS: Review of Systems  Constitutional:  Positive for fatigue. Negative for activity change, appetite change, chills and unexpected weight change.  HENT:  Negative for congestion, mouth sores and sinus pressure.   Eyes:  Negative for visual disturbance.  Respiratory:  Negative for cough and chest tightness.   Gastrointestinal:  Negative for abdominal pain and nausea.  Genitourinary:  Negative for difficulty urinating, frequency and  vaginal pain.  Musculoskeletal:  Negative for back pain and gait problem.  Skin:  Negative for pallor and rash.  Neurological:  Negative for dizziness, tremors, weakness, numbness and headaches.  Psychiatric/Behavioral:  Positive for dysphoric mood. Negative for confusion, sleep disturbance and suicidal ideas. The patient is nervous/anxious.    Objective:  BP 120/78 (BP Location: Left Arm, Patient Position: Sitting, Cuff Size: Large)    Pulse 92    Temp 98.1 F (36.7 C) (Oral)    Ht 5\' 6"  (1.676 m)    Wt 271 lb (122.9 kg)    SpO2 98%    BMI 43.74 kg/m   BP Readings from Last 3 Encounters:  07/06/21 120/78  05/26/21 (!) 130/92  05/19/21 122/80    Wt Readings from Last 3 Encounters:  07/06/21 271 lb (122.9 kg)  05/26/21 267 lb 3.2 oz (121.2 kg)  05/19/21 265 lb (120.2 kg)    Physical Exam Constitutional:      General: She is not in acute distress.    Appearance: She is well-developed. She is obese.  HENT:     Head: Normocephalic.     Right Ear: External ear normal.     Left Ear: External ear normal.     Nose: Nose normal.  Eyes:     General:        Right eye: No discharge.  Left eye: No discharge.     Conjunctiva/sclera: Conjunctivae normal.     Pupils: Pupils are equal, round, and reactive to light.  Neck:     Thyroid: No thyromegaly.     Vascular: No JVD.     Trachea: No tracheal deviation.  Cardiovascular:     Rate and Rhythm: Normal rate and regular rhythm.     Heart sounds: Normal heart sounds.  Pulmonary:     Effort: No respiratory distress.     Breath sounds: No stridor. No wheezing.  Abdominal:     General: Bowel sounds are normal. There is no distension.     Palpations: Abdomen is soft. There is no mass.     Tenderness: There is no abdominal tenderness. There is no guarding or rebound.  Musculoskeletal:        General: No tenderness.     Cervical back: Normal range of motion and neck supple. No rigidity.  Lymphadenopathy:     Cervical: No cervical  adenopathy.  Skin:    Findings: No erythema or rash.  Neurological:     Cranial Nerves: No cranial nerve deficit.     Motor: No abnormal muscle tone.     Coordination: Coordination normal.     Deep Tendon Reflexes: Reflexes normal.  Psychiatric:        Behavior: Behavior normal.        Thought Content: Thought content normal.        Judgment: Judgment normal.   ingrown toenail pain R 2nd toe Lab Results  Component Value Date   WBC 10.4 05/03/2021   HGB 11.5 (L) 05/03/2021   HCT 36.0 05/03/2021   PLT 316 05/03/2021   GLUCOSE 104 (H) 05/03/2021   CHOL 140 04/09/2020   TRIG 97.0 04/09/2020   HDL 39.30 04/09/2020   LDLCALC 81 04/09/2020   ALT 6 05/03/2021   AST 8 (L) 05/03/2021   NA 137 05/03/2021   K 3.2 (L) 05/03/2021   CL 103 05/03/2021   CREATININE 0.65 05/03/2021   BUN 6 05/03/2021   CO2 24 05/03/2021   TSH 2.69 04/09/2020   HGBA1C 5.9 04/09/2020    No results found.  Assessment & Plan:   Problem List Items Addressed This Visit     Anxiety and depression    Better.  Continue with current management      Relevant Medications   TRINTELLIX 10 MG TABS tablet   Ingrown toenail    Mild ingrown toenail pain R 2nd toe, no abscess Keflex and Bactroban for travel Use Bactroban now, Epsom salt soaks         Meds ordered this encounter  Medications   cephALEXin (KEFLEX) 500 MG capsule    Sig: Take 1 capsule (500 mg total) by mouth 4 (four) times daily.    Dispense:  28 capsule    Refill:  1   mupirocin ointment (BACTROBAN) 2 %    Sig: On leg wound w/dressing change bid    Dispense:  30 g    Refill:  0      Follow-up: No follow-ups on file.  Walker Kehr, MD

## 2021-07-12 ENCOUNTER — Other Ambulatory Visit: Payer: Self-pay | Admitting: Internal Medicine

## 2021-07-12 ENCOUNTER — Ambulatory Visit (INDEPENDENT_AMBULATORY_CARE_PROVIDER_SITE_OTHER): Payer: BC Managed Care – PPO | Admitting: Psychology

## 2021-07-12 DIAGNOSIS — F331 Major depressive disorder, recurrent, moderate: Secondary | ICD-10-CM | POA: Diagnosis not present

## 2021-07-12 NOTE — Progress Notes (Signed)
[place] Counselor/Therapist Progress Note  Patient ID: Kendra Allen, MRN: [id number],    Date: 07/12/2021  Time Spent: 4:00pm-4:50pm   50 minutes   Treatment Type: Individual Therapy  Reported Symptoms: anxiety, worry  Mental Status Exam: Appearance:  Casual     Behavior: Appropriate  Motor: Normal  Speech/Language:  Normal Rate  Affect: Appropriate  Mood: normal  Thought process: normal  Thought content:   WNL  Sensory/Perceptual disturbances:   WNL  Orientation: oriented to person, place, time/date, and situation  Attention: Good  Concentration: Good  Memory: WNL  Fund of knowledge:  Good  Insight:   Good  Judgment:  Good  Impulse Control: Good   Risk Assessment: Danger to Self:  No Self-injurious Behavior: No Danger to Others: No Duty to Warn:no Physical Aggression / Violence:No  Access to Firearms a concern: No  Gang Involvement:No   Subjective: Pt present for face-to-face individual therapy via video Webex.  Pt consents to telehealth video session due to COVID 19 pandemic. Location of pt: home Location of therapist: home office.  Pt states she has been more productive and has taken walks and gone to the aquatics center.  Acknowledged pt for her improvements in being active. Pt has felt bored and has eaten too much bc of boredom.  Worked with pt on things she can do with her time.  Helped her explore what she may like to try to do.  Encouraged pt to have a mindset of exploration.  Pt wants to wait to look for a job.  Her psychiatrist suggested she give herself time to continue to heal and improve before engaging in a job search.  Pt states her mood has been improving recently which has helped her feel more motivated to engage in her day.   Pt talked about the trip she is going on with her parents.  They are going to go to a resort and she is feeling anxious that she may feel down when she is there.  Worked with pt on emotional regulation skills  and how she can have compassion and grace toward her feelings.   Provided supportive therapy.   Interventions: Cognitive Behavioral Therapy and Insight-Oriented  Diagnosis: F33.1  Plan:  Plan to continue to meet every two weeks.   Treatment Plan  (treatment plan target date: 06/28/2022). Client Abilities/Strengths  Pt is bright and motivated for therapy. Client Treatment Preferences  Individual therapy.  Client Statement of Needs  Improve coping skills.  Symptoms  Depressed or irritable mood.  Diminished interest in or enjoyment of activities.  Problems Addressed  Unipolar Depression Goals 1. Alleviate depressive symptoms and return to previous level of effective functioning. 2. Appropriately grieve the loss in order to normalize mood and to return to previously adaptive level of functioning. Objective Learn and implement behavioral strategies to overcome depression. Target Date: 2022-06-28 Frequency: Biweekly  Progress: 10 Modality: individual  Related Interventions Engage the client in "behavioral activation," increasing his/her activity level and contact with sources of reward, while identifying processes that inhibit activation.  Use behavioral techniques such as instruction, rehearsal, role-playing, role reversal, as needed, to facilitate activity in the client's daily life; reinforce success. Assist the client in developing skills that increase the likelihood of deriving pleasure from behavioral activation (e.g., assertiveness skills, developing an exercise plan, less internal/more external focus, increased social involvement); reinforce success. Objective Identify important people in life, past and present, and describe the quality, good and poor, of those relationships. Target Date: 2022-06-28  Frequency: Biweekly  Progress: 10 Modality: individual  Related Interventions Conduct Interpersonal Therapy beginning with the assessment of the client's "interpersonal inventory" of  important past and present relationships; develop a case formulation linking depression to grief, interpersonal role disputes, role transitions, and/or interpersonal deficits). Objective Learn and implement problem-solving and decision-making skills. Target Date: 2022-06-28 Frequency: Biweekly  Progress: 10 Modality: individual  Related Interventions Conduct Problem-Solving Therapy using techniques such as psychoeducation, modeling, and role-playing to teach client problem-solving skills (i.e., defining a problem specifically, generating possible solutions, evaluating the pros and cons of each solution, selecting and implementing a plan of action, evaluating the efficacy of the plan, accepting or revising the plan); role-play application of the problem-solving skill to a real life issue. Encourage in the client the development of a positive problem orientation in which problems and solving them are viewed as a natural part of life and not something to be feared, despaired, or avoided. 3. Develop healthy interpersonal relationships that lead to the alleviation and help prevent the relapse of depression. 4. Develop healthy thinking patterns and beliefs about self, others, and the world that lead to the alleviation and help prevent the relapse of depression. 5. Recognize, accept, and cope with feelings of depression. Diagnosis F33.1  Conditions For Discharge Achievement of treatment goals and objectives   Clint Bolder, LCSW

## 2021-07-13 ENCOUNTER — Ambulatory Visit: Payer: BC Managed Care – PPO | Admitting: Internal Medicine

## 2021-07-13 ENCOUNTER — Encounter: Payer: BC Managed Care – PPO | Admitting: Physical Therapy

## 2021-07-13 MED ORDER — SPIRONOLACTONE 50 MG PO TABS: 50.0000 mg | ORAL_TABLET | Freq: Every day | ORAL | 1 refills | Status: DC

## 2021-07-20 ENCOUNTER — Ambulatory Visit: Payer: Self-pay | Admitting: Physical Therapy

## 2021-07-20 NOTE — Telephone Encounter (Signed)
I spoke with patient. I have placed MRI order and she will call and schedule her appointment after her 26th birthday per Dr. Gentry Fitz message.  I sent her a My Chart message with the phone number info and asked her to let me know once she has it schedule so we can handle ins PA.

## 2021-07-20 NOTE — Telephone Encounter (Signed)
Appt for Breast MRI scheduled 08/04/2021 at [place].  Routed to [facility] to check on Prior Auth.

## 2021-07-21 ENCOUNTER — Ambulatory Visit (INDEPENDENT_AMBULATORY_CARE_PROVIDER_SITE_OTHER): Payer: BC Managed Care – PPO | Admitting: *Deleted

## 2021-07-21 ENCOUNTER — Other Ambulatory Visit: Payer: Self-pay

## 2021-07-21 DIAGNOSIS — Z23 Encounter for immunization: Secondary | ICD-10-CM | POA: Diagnosis not present

## 2021-07-25 ENCOUNTER — Encounter: Payer: Self-pay | Admitting: Internal Medicine

## 2021-07-25 ENCOUNTER — Ambulatory Visit (INDEPENDENT_AMBULATORY_CARE_PROVIDER_SITE_OTHER): Payer: BC Managed Care – PPO | Admitting: Psychology

## 2021-07-25 DIAGNOSIS — F331 Major depressive disorder, recurrent, moderate: Secondary | ICD-10-CM

## 2021-07-25 DIAGNOSIS — L600 Ingrowing nail: Secondary | ICD-10-CM | POA: Insufficient documentation

## 2021-07-25 NOTE — Assessment & Plan Note (Signed)
Mild ingrown toenail pain R 2nd toe, no abscess Keflex and Bactroban for travel Use Bactroban now, Epsom salt soaks

## 2021-07-25 NOTE — Assessment & Plan Note (Signed)
Better.  Continue with current management

## 2021-07-25 NOTE — Telephone Encounter (Signed)
No Authorization Required.  Encounter previously closed.

## 2021-07-25 NOTE — Progress Notes (Signed)
[place] Counselor/Therapist Progress Note  Patient ID: Kendra Allen, MRN: [id number],    Date: 07/25/2021  Time Spent: 3:00pm-3:45pm   45 minutes   Treatment Type: Individual Therapy  Reported Symptoms: worry  Mental Status Exam: Appearance:  Casual     Behavior: Appropriate  Motor: Normal  Speech/Language:  Normal Rate  Affect: Appropriate  Mood: normal  Thought process: normal  Thought content:   WNL  Sensory/Perceptual disturbances:   WNL  Orientation: oriented to person, place, time/date, and situation  Attention: Good  Concentration: Good  Memory: WNL  Fund of knowledge:  Good  Insight:   Good  Judgment:  Good  Impulse Control: Good   Risk Assessment: Danger to Self:  No Self-injurious Behavior: No Danger to Others: No Duty to Warn:no Physical Aggression / Violence:No  Access to Firearms a concern: No  Gang Involvement:No   Subjective: Pt present for face-to-face individual therapy via video Webex.  Pt consents to telehealth video session due to COVID 19 pandemic. Location of pt: home Location of therapist: home office.  Pt talked about the trip she is going on with her parents.  They leave tomorrow.   Pt feels good about it bc she is prepared.   Pt states that she has been struggling with boredom.  She has gotten a Building services engineer and hopes that that helps.   After the trip pt will look into career options and begin to explore options.   Pt has tried doing an adult coloring book but it did not hold her attention much.  Helped pt problem solve how to effectively use her time.   Pt talked about her mood.  She did not feel very depressed lately.   She mostly feels some sadness when she feels bored.   Pt talked with her friends regularly.  She is planning a trip to [place] with her friends this spring.   Pt talked about her grandmother not understanding her depression.  Helped pt process her feelings and relationship dynamics.  Worked with pt on how  she can communicate with her grandmother.   Provided supportive therapy.   Interventions: Cognitive Behavioral Therapy and Insight-Oriented  Diagnosis: F33.1  Plan:  Plan to continue to meet every two weeks.   Treatment Plan  (treatment plan target date: 06/28/2022). Client Abilities/Strengths  Pt is bright and motivated for therapy. Client Treatment Preferences  Individual therapy.  Client Statement of Needs  Improve coping skills.  Symptoms  Depressed or irritable mood.  Diminished interest in or enjoyment of activities.  Problems Addressed  Unipolar Depression Goals 1. Alleviate depressive symptoms and return to previous level of effective functioning. 2. Appropriately grieve the loss in order to normalize mood and to return to previously adaptive level of functioning. Objective Learn and implement behavioral strategies to overcome depression. Target Date: 2022-06-28 Frequency: Biweekly  Progress: 10 Modality: individual  Related Interventions Engage the client in "behavioral activation," increasing his/her activity level and contact with sources of reward, while identifying processes that inhibit activation.  Use behavioral techniques such as instruction, rehearsal, role-playing, role reversal, as needed, to facilitate activity in the client's daily life; reinforce success. Assist the client in developing skills that increase the likelihood of deriving pleasure from behavioral activation (e.g., assertiveness skills, developing an exercise plan, less internal/more external focus, increased social involvement); reinforce success. Objective Identify important people in life, past and present, and describe the quality, good and poor, of those relationships. Target Date: 2022-06-28 Frequency: Biweekly  Progress: 10  Modality: individual  Related Interventions Conduct Interpersonal Therapy beginning with the assessment of the client's "interpersonal inventory" of important past and  present relationships; develop a case formulation linking depression to grief, interpersonal role disputes, role transitions, and/or interpersonal deficits). Objective Learn and implement problem-solving and decision-making skills. Target Date: 2022-06-28 Frequency: Biweekly  Progress: 10 Modality: individual  Related Interventions Conduct Problem-Solving Therapy using techniques such as psychoeducation, modeling, and role-playing to teach client problem-solving skills (i.e., defining a problem specifically, generating possible solutions, evaluating the pros and cons of each solution, selecting and implementing a plan of action, evaluating the efficacy of the plan, accepting or revising the plan); role-play application of the problem-solving skill to a real life issue. Encourage in the client the development of a positive problem orientation in which problems and solving them are viewed as a natural part of life and not something to be feared, despaired, or avoided. 3. Develop healthy interpersonal relationships that lead to the alleviation and help prevent the relapse of depression. 4. Develop healthy thinking patterns and beliefs about self, others, and the world that lead to the alleviation and help prevent the relapse of depression. 5. Recognize, accept, and cope with feelings of depression. Diagnosis F33.1  Conditions For Discharge Achievement of treatment goals and objectives   Clint Bolder, LCSW                  Tonna Palazzi [place], LCSW

## 2021-08-04 ENCOUNTER — Ambulatory Visit
Admission: RE | Admit: 2021-08-04 | Discharge: 2021-08-04 | Disposition: A | Discharge status: Home / Self Care | Payer: BC Managed Care – PPO | Source: Ambulatory Visit | Attending: Obstetrics and Gynecology | Admitting: Obstetrics and Gynecology

## 2021-08-04 DIAGNOSIS — Z9189 Other specified personal risk factors, not elsewhere classified: Secondary | ICD-10-CM

## 2021-08-04 MED ORDER — GADOBUTROL 1 MMOL/ML IV SOLN
10.0000 mL | Freq: Once | INTRAVENOUS | Status: AC | PRN
  Administered 2021-08-04: 10 mL via INTRAVENOUS

## 2021-08-10 ENCOUNTER — Ambulatory Visit (INDEPENDENT_AMBULATORY_CARE_PROVIDER_SITE_OTHER): Payer: BC Managed Care – PPO | Admitting: Psychology

## 2021-08-10 DIAGNOSIS — F331 Major depressive disorder, recurrent, moderate: Secondary | ICD-10-CM | POA: Diagnosis not present

## 2021-08-10 NOTE — Progress Notes (Signed)
[place] Counselor/Therapist Progress Note  Patient ID: Kendra Allen, MRN: [id number],    Date: 08/10/2021  Time Spent: 4:00pm-4:45pm   45 minutes   Treatment Type: Individual Therapy  Reported Symptoms: worry, stress  Mental Status Exam: Appearance:  Casual     Behavior: Appropriate  Motor: Normal  Speech/Language:  Normal Rate  Affect: Appropriate  Mood: normal  Thought process: normal  Thought content:   WNL  Sensory/Perceptual disturbances:   WNL  Orientation: oriented to person, place, time/date, and situation  Attention: Good  Concentration: Good  Memory: WNL  Fund of knowledge:  Good  Insight:   Good  Judgment:  Good  Impulse Control: Good   Risk Assessment: Danger to Self:  No Self-injurious Behavior: No Danger to Others: No Duty to Warn:no Physical Aggression / Violence:No  Access to Firearms a concern: No  Gang Involvement:No   Subjective: Pt present for face-to-face individual therapy via video Webex.  Pt consents to telehealth video session due to COVID 19 pandemic. Location of pt: home Location of therapist: home office.  Pt talked about her trip with her parents.   She was anxious about the traveling but was ok once they were at their destination.  Pt enjoyed the activities that they did at the beach.   Pt states that since her vacation she has been tired but has nudged herself to be more active.   She has been going to water aerobics and to the gym. 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   She is looking into masters degree programs.   Pt states she is at the point where she really wants to work bc she is getting very bored at home.    Pt's mood has improved but at times she gets worried that she will "go backwards" and feel depressed again.  Worked with pt on thought reframing and self care strategies.   Provided supportive therapy.    Interventions: Cognitive Behavioral Therapy and Insight-Oriented  Diagnosis: F33.1  Plan:  Plan to continue to meet every two weeks.   Treatment Plan  (treatment plan target date: 06/28/2022). Client Abilities/Strengths  Pt is bright and motivated for therapy. Client Treatment Preferences  Individual therapy.  Client Statement of Needs  Improve coping skills.  Symptoms  Depressed or irritable mood.  Diminished interest in or enjoyment of activities.  Problems Addressed  Unipolar Depression Goals 1. Alleviate depressive symptoms and return to previous level of effective functioning. 2. Appropriately grieve the loss in order to normalize mood and to return to previously adaptive level of functioning. Objective Learn and implement behavioral strategies to overcome depression. Target Date: 2022-06-28 Frequency: Biweekly  Progress: 10 Modality: individual  Related Interventions Engage the client in "behavioral activation," increasing his/her activity level and contact with sources of reward, while identifying processes that inhibit activation.  Use behavioral techniques such as instruction, rehearsal, role-playing, role reversal, as needed, to facilitate activity in the client's daily life; reinforce success. Assist the client in developing skills that increase the likelihood of deriving pleasure from behavioral activation (e.g., assertiveness skills, developing an exercise plan, less internal/more external focus, increased social involvement); reinforce success. Objective Identify important people in life, past and present, and describe the quality, good and poor, of those relationships. Target Date: 2022-06-28 Frequency: Biweekly  Progress: 10 Modality: individual  Related Interventions Conduct Interpersonal Therapy beginning with the assessment of the client's "interpersonal inventory"  of important past and present relationships; develop a case formulation linking depression to grief,  interpersonal role disputes, role transitions, and/or interpersonal deficits). Objective Learn and implement problem-solving and decision-making skills. Target Date: 2022-06-28 Frequency: Biweekly  Progress: 10 Modality: individual  Related Interventions Conduct Problem-Solving Therapy using techniques such as psychoeducation, modeling, and role-playing to teach client problem-solving skills (i.e., defining a problem specifically, generating possible solutions, evaluating the pros and cons of each solution, selecting and implementing a plan of action, evaluating the efficacy of the plan, accepting or revising the plan); role-play application of the problem-solving skill to a real life issue. Encourage in the client the development of a positive problem orientation in which problems and solving them are viewed as a natural part of life and not something to be feared, despaired, or avoided. 3. Develop healthy interpersonal relationships that lead to the alleviation and help prevent the relapse of depression. 4. Develop healthy thinking patterns and beliefs about self, others, and the world that lead to the alleviation and help prevent the relapse of depression. 5. Recognize, accept, and cope with feelings of depression. Diagnosis F33.1  Conditions For Discharge Achievement of treatment goals and objectives   Clint Bolder, LCSW

## 2021-08-23 ENCOUNTER — Ambulatory Visit (INDEPENDENT_AMBULATORY_CARE_PROVIDER_SITE_OTHER): Payer: BC Managed Care – PPO | Admitting: Psychology

## 2021-08-23 DIAGNOSIS — F331 Major depressive disorder, recurrent, moderate: Secondary | ICD-10-CM

## 2021-08-23 NOTE — Progress Notes (Signed)
[place] Counselor/Therapist Progress Note ? ?Patient ID: Kendra Allen, MRN: [id number],   ? ?Date: 08/23/2021 ? ?Time Spent: 10:00am-10:45am   45 minutes  ? ?Treatment Type: Individual Therapy ? ?Reported Symptoms: worry, stress ? ?Mental Status Exam: ?Appearance:  Casual     ?Behavior: Appropriate  ?Motor: Normal  ?Speech/Language:  Normal Rate  ?Affect: Appropriate  ?Mood: normal  ?Thought process: normal  ?Thought content:   WNL  ?Sensory/Perceptual disturbances:   WNL  ?Orientation: oriented to person, place, time/date, and situation  ?Attention: Good  ?Concentration: Good  ?Memory: WNL  ?Fund of knowledge:  Good  ?Insight:   Good  ?Judgment:  Good  ?Impulse Control: Good  ? ?Risk Assessment: ?Danger to Self:  No ?Self-injurious Behavior: No ?Danger to Others: No ?Duty to Warn:no ?Physical Aggression / Violence:No  ?Access to Firearms a concern: No  ?Gang Involvement:No  ? ?Subjective: Pt present for face-to-face individual therapy via video Webex.  Pt consents to telehealth video session due to COVID 19 pandemic. ?Location of pt: home ?Location of therapist: home office.  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  It made her realize she does not want to be a Pharmacist, hospital.    ?Pt has started looking into studying to be an occupational therapist.   Pt has registered to take classes at [employer] this summer.  She has to take some prerequisite classes to get into OT school. ?Pt talked about her health.  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     ?Pt has plans with friends to go to [place].   She is also babysitting for friends.   Pt is working on being more active and involved.   ?Pt states she is having trouble acknowledging herself for the steps she has taken.   She is being very self critical with her internal dialogue.  Addressed pt's negative self talk.   She compares herself to others her age and feels like she should be in a different place.   ?Pt was  tearful as she talked about "life feeling hard".  Pt feels like she wants a break from herself at times bc of the inner criticism.  ?Worked with pt on thought reframing and self care strategies.   Worked on positive affirmations.   ?Provided supportive therapy.  ? ?Interventions: Cognitive Behavioral Therapy and Insight-Oriented ? ?Diagnosis: F33.1 ? ?Plan:  Plan to continue to meet every two weeks.   ?Treatment Plan  (treatment plan target date: 06/28/2022). ?Client Abilities/Strengths  ?Pt is bright and motivated for therapy. ?Client Treatment Preferences  ?Individual therapy.  ?Client Statement of Needs  ?Improve coping skills.  ?Symptoms  ?Depressed or irritable mood.  Diminished interest in or enjoyment of activities.  ?Problems Addressed  ?Unipolar Depression ?Goals ?1. Alleviate depressive symptoms and return to previous level of effective functioning. ?2. Appropriately grieve the loss in order to normalize mood and to return to previously adaptive level of functioning. ?Objective ?Learn and implement behavioral strategies to overcome depression. ?Target Date: 2022-06-28 Frequency: Biweekly  ?Progress: 10 Modality: individual  ?Related Interventions ?Engage the client in "behavioral activation," increasing his/her activity level and contact with sources of reward, while identifying processes that inhibit activation.  Use behavioral techniques such as instruction, rehearsal, role-playing, role reversal, as needed, to facilitate activity in the client's daily life; reinforce success. ?Assist the client in developing skills that increase the likelihood of deriving pleasure from behavioral activation (e.g., assertiveness skills, developing an  exercise plan, less internal/more external focus, increased social involvement); reinforce success. ?Objective ?Identify important people in life, past and present, and describe the quality, good and poor, of those relationships. ?Target Date: 2022-06-28 Frequency: Biweekly   ?Progress: 10 Modality: individual  ?Related Interventions ?Conduct Interpersonal Therapy beginning with the assessment of the client's "interpersonal inventory" of important past and present relationships; develop a case formulation linking depression to grief, interpersonal role disputes, role transitions, and/or interpersonal deficits). ?Objective ?Learn and implement problem-solving and decision-making skills. ?Target Date: 2022-06-28 Frequency: Biweekly  ?Progress: 10 Modality: individual  ?Related Interventions ?Conduct Problem-Solving Therapy using techniques such as psychoeducation, modeling, and role-playing to teach client problem-solving skills (i.e., defining a problem specifically, generating possible solutions, evaluating the pros and cons of each solution, selecting and implementing a plan of action, evaluating the efficacy of the plan, accepting or revising the plan); role-play application of the problem-solving skill to a real life issue. ?Encourage in the client the development of a positive problem orientation in which problems and solving them are viewed as a natural part of life and not something to be feared, despaired, or avoided. ?3. Develop healthy interpersonal relationships that lead to the alleviation and help prevent the relapse of depression. ?4. Develop healthy thinking patterns and beliefs about self, others, and the world that lead to the alleviation and help prevent the relapse of depression. ?5. Recognize, accept, and cope with feelings of depression. ?Diagnosis ?F33.1  ?Conditions For Discharge ?Achievement of treatment goals and objectives  ? ?Helon Wisinski, LCSW ? ? ? ?

## 2021-09-06 ENCOUNTER — Ambulatory Visit (INDEPENDENT_AMBULATORY_CARE_PROVIDER_SITE_OTHER): Payer: BC Managed Care – PPO | Admitting: Psychology

## 2021-09-06 DIAGNOSIS — F331 Major depressive disorder, recurrent, moderate: Secondary | ICD-10-CM

## 2021-09-06 NOTE — Progress Notes (Signed)
[facility] Counselor/Therapist Progress Note ? ?Patient ID: CASARA PERRIER, MRN: [id number],   ? ?Date: 09/06/2021 ? ?Time Spent: 11:00am-11:45am   45 minutes  ? ?Treatment Type: Individual Therapy ? ?Reported Symptoms:  stress ? ?Mental Status Exam: ?Appearance:  Casual     ?Behavior: Appropriate  ?Motor: Normal  ?Speech/Language:  Normal Rate  ?Affect: Appropriate  ?Mood: normal  ?Thought process: normal  ?Thought content:   WNL  ?Sensory/Perceptual disturbances:   WNL  ?Orientation: oriented to person, place, time/date, and situation  ?Attention: Good  ?Concentration: Good  ?Memory: WNL  ?Fund of knowledge:  Good  ?Insight:   Good  ?Judgment:  Good  ?Impulse Control: Good  ? ?Risk Assessment: ?Danger to Self:  No ?Self-injurious Behavior: No ?Danger to Others: No ?Duty to Warn:no ?Physical Aggression / Violence:No  ?Access to Firearms a concern: No  ?Gang Involvement:No  ? ?Subjective: Pt present for face-to-face individual therapy via video Webex.  Pt consents to telehealth video session due to COVID 19 pandemic. ?Location of pt: home ?Location of therapist: home office.  ?Pt talked about being more busy lately.  She is helping take care of a friend's kids.  Pt feels better being more busy. ?Pt went to [place] with friends for 4 days and had a good time.   The trip helped to elevate pt's mood.   ?Acknowleged pt for her efforts to be more involved with people and active.   Pt continues to go to the gym and to go walking.   ?Pt states she is working on acknowledging herself for the steps she has taken.     ?Worked with pt on self care strategies.   Worked on positive affirmations.   ?Provided supportive therapy.  ? ?Interventions: Cognitive Behavioral Therapy and Insight-Oriented ? ?Diagnosis: F33.1 ? ?Plan:  Plan to continue to meet every two weeks.   ?Treatment Plan  (treatment plan target date: 06/28/2022). ?Client Abilities/Strengths  ?Pt is bright and motivated for therapy. ?Client Treatment  Preferences  ?Individual therapy.  ?Client Statement of Needs  ?Improve coping skills.  ?Symptoms  ?Depressed or irritable mood.  Diminished interest in or enjoyment of activities.  ?Problems Addressed  ?Unipolar Depression ?Goals ?1. Alleviate depressive symptoms and return to previous level of effective functioning. ?2. Appropriately grieve the loss in order to normalize mood and to return to previously adaptive level of functioning. ?Objective ?Learn and implement behavioral strategies to overcome depression. ?Target Date: 2022-06-28 Frequency: Biweekly  ?Progress: 10 Modality: individual  ?Related Interventions ?Engage the client in "behavioral activation," increasing his/her activity level and contact with sources of reward, while identifying processes that inhibit activation.  Use behavioral techniques such as instruction, rehearsal, role-playing, role reversal, as needed, to facilitate activity in the client's daily life; reinforce success. ?Assist the client in developing skills that increase the likelihood of deriving pleasure from behavioral activation (e.g., assertiveness skills, developing an exercise plan, less internal/more external focus, increased social involvement); reinforce success. ?Objective ?Identify important people in life, past and present, and describe the quality, good and poor, of those relationships. ?Target Date: 2022-06-28 Frequency: Biweekly  ?Progress: 10 Modality: individual  ?Related Interventions ?Conduct Interpersonal Therapy beginning with the assessment of the client's "interpersonal inventory" of important past and present relationships; develop a case formulation linking depression to grief, interpersonal role disputes, role transitions, and/or interpersonal deficits). ?Objective ?Learn and implement problem-solving and decision-making skills. ?Target Date: 2022-06-28 Frequency: Biweekly  ?Progress: 10 Modality: individual  ?Related Interventions ?Conduct Problem-Solving  Therapy using  techniques such as psychoeducation, modeling, and role-playing to teach client problem-solving skills (i.e., defining a problem specifically, generating possible solutions, evaluating the pros and cons of each solution, selecting and implementing a plan of action, evaluating the efficacy of the plan, accepting or revising the plan); role-play application of the problem-solving skill to a real life issue. ?Encourage in the client the development of a positive problem orientation in which problems and solving them are viewed as a natural part of life and not something to be feared, despaired, or avoided. ?3. Develop healthy interpersonal relationships that lead to the alleviation and help prevent the relapse of depression. ?4. Develop healthy thinking patterns and beliefs about self, others, and the world that lead to the alleviation and help prevent the relapse of depression. ?5. Recognize, accept, and cope with feelings of depression. ?Diagnosis ?F33.1  ?Conditions For Discharge ?Achievement of treatment goals and objectives  ? ?Samin Milke, LCSW ? ? ? ?

## 2021-09-19 ENCOUNTER — Ambulatory Visit (INDEPENDENT_AMBULATORY_CARE_PROVIDER_SITE_OTHER): Payer: BC Managed Care – PPO | Admitting: Psychology

## 2021-09-19 DIAGNOSIS — F331 Major depressive disorder, recurrent, moderate: Secondary | ICD-10-CM

## 2021-09-19 NOTE — Progress Notes (Signed)
[facility] Counselor/Therapist Progress Note ? ?Patient ID: Kendra Allen, MRN: [id number],   ? ?Date: 09/19/2021 ? ?Time Spent: 4:00pm-4:45pm   45 minutes  ? ?Treatment Type: Individual Therapy ? ?Reported Symptoms:  stress ? ?Mental Status Exam: ?Appearance:  Casual     ?Behavior: Appropriate  ?Motor: Normal  ?Speech/Language:  Normal Rate  ?Affect: Appropriate  ?Mood: normal  ?Thought process: normal  ?Thought content:   WNL  ?Sensory/Perceptual disturbances:   WNL  ?Orientation: oriented to person, place, time/date, and situation  ?Attention: Good  ?Concentration: Good  ?Memory: WNL  ?Fund of knowledge:  Good  ?Insight:   Good  ?Judgment:  Good  ?Impulse Control: Good  ? ?Risk Assessment: ?Danger to Self:  No ?Self-injurious Behavior: No ?Danger to Others: No ?Duty to Warn:no ?Physical Aggression / Violence:No  ?Access to Firearms a concern: No  ?Gang Involvement:No  ? ?Subjective: Pt present for face-to-face individual therapy via video Webex.  Pt consents to telehealth video session due to COVID 19 pandemic. ?Location of pt: home ?Location of therapist: home office.  ?Pt talked about feeling more anxious lately but she is still functional.  Worked with pt on identifying triggers to her anxiety.  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   She worries about going back to school since she has been out of college for 3 years.  Worked on thought reframing and calming strategies.   ?Pt has been nudging herself to be active and involved with people.   ?Pt has tried to continue to do exercise by doing water aerobics.  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     ?Worked with pt on self care strategies.   Worked on positive affirmations.   ?Provided supportive therapy.  ? ?Interventions: Cognitive Behavioral Therapy and Insight-Oriented ? ?Diagnosis: F33.1 ? ?Plan:  Plan to continue to meet every two weeks.   ?Treatment Plan   (treatment plan target date: 06/28/2022). ?Client Abilities/Strengths  ?Pt is bright and motivated for therapy. ?Client Treatment Preferences  ?Individual therapy.  ?Client Statement of Needs  ?Improve coping skills.  ?Symptoms  ?Depressed or irritable mood.  Diminished interest in or enjoyment of activities.  ?Problems Addressed  ?Unipolar Depression ?Goals ?1. Alleviate depressive symptoms and return to previous level of effective functioning. ?2. Appropriately grieve the loss in order to normalize mood and to return to previously adaptive level of functioning. ?Objective ?Learn and implement behavioral strategies to overcome depression. ?Target Date: 2022-06-28 Frequency: Biweekly  ?Progress: 10 Modality: individual  ?Related Interventions ?Engage the client in "behavioral activation," increasing his/her activity level and contact with sources of reward, while identifying processes that inhibit activation.  Use behavioral techniques such as instruction, rehearsal, role-playing, role reversal, as needed, to facilitate activity in the client's daily life; reinforce success. ?Assist the client in developing skills that increase the likelihood of deriving pleasure from behavioral activation (e.g., assertiveness skills, developing an exercise plan, less internal/more external focus, increased social involvement); reinforce success. ?Objective ?Identify important people in life, past and present, and describe the quality, good and poor, of those relationships. ?Target Date: 2022-06-28 Frequency: Biweekly  ?Progress: 10 Modality: individual  ?Related Interventions ?Conduct Interpersonal Therapy beginning with the assessment of the client's "interpersonal inventory" of important past and present relationships; develop a case formulation linking depression to grief, interpersonal role disputes, role transitions, and/or interpersonal deficits). ?Objective ?Learn and implement problem-solving and decision-making  skills. ?Target Date: 2022-06-28 Frequency: Biweekly  ?Progress: 10 Modality: individual  ?Related Interventions ?Conduct Problem-Solving Therapy using techniques such as psychoeducation, modeling, and role-playing to teach client problem-solving skills (i.e., defining a problem specifically, generating possible solutions, evaluating the pros and cons of each solution, selecting and implementing a plan of action, evaluating the efficacy of the plan, accepting or revising the plan); role-play application of the problem-solving skill to a real life issue. ?Encourage in the client the development of a positive problem orientation in which problems and solving them are viewed as a natural part of life and not something to be feared, despaired, or avoided. ?3. Develop healthy interpersonal relationships that lead to the alleviation and help prevent the relapse of depression. ?4. Develop healthy thinking patterns and beliefs about self, others, and the world that lead to the alleviation and help prevent the relapse of depression. ?5. Recognize, accept, and cope with feelings of depression. ?Diagnosis ?F33.1  ?Conditions For Discharge ?Achievement of treatment goals and objectives  ? ?Kairav Russomanno, LCSW ? ? ?

## 2021-09-29 ENCOUNTER — Ambulatory Visit: Payer: BC Managed Care – PPO | Attending: Obstetrics and Gynecology

## 2021-09-29 DIAGNOSIS — M6281 Muscle weakness (generalized): Secondary | ICD-10-CM | POA: Diagnosis present

## 2021-09-29 DIAGNOSIS — R278 Other lack of coordination: Secondary | ICD-10-CM

## 2021-09-29 DIAGNOSIS — R252 Cramp and spasm: Secondary | ICD-10-CM

## 2021-09-29 NOTE — Therapy (Signed)
?OUTPATIENT PHYSICAL THERAPY FEMALE PELVIC EVALUATION ? ? ?Patient Name: Kendra Allen ?MRN: [id number] ?DOB:[birth year], 26 y.o., female ?Today's Date: 09/29/2021 ? ? PT End of Session - 09/29/21 0943   ? ? Visit Number 1   ? Date for PT Re-Evaluation 12/22/21   ? Authorization Type BCBS   ? PT Start Time [id number]   ? PT Stop Time 1012   ? PT Time Calculation (min) 39 min   ? Activity Tolerance Patient tolerated treatment well   ? Behavior During Therapy [facility] for tasks assessed/performed   ? ?  ?  ? ?  ? ? ?Past Medical History:  ?Diagnosis Date  ? Anemia   ? Anxiety   ? Chest pain   ? Constipation   ? Depression   ? Endometriosis of pelvis   ? Family history of breast cancer   ? Family history of colon cancer   ? Family history of ovarian cancer   ? Family history of prostate cancer   ? Hidradenitis suppurativa   ? PCOS (polycystic ovarian syndrome)   ? Prediabetes   ? Sleep apnea   ? Vitamin D deficiency   ? ?Past Surgical History:  ?Procedure Laterality Date  ? CERVICAL POLYPECTOMY    ? DILATATION & CURETTAGE/HYSTEROSCOPY WITH MYOSURE N/A 11/21/2016  ? Procedure: [place];  [name], MD;  Location: [facility];  Service: Gynecology;  [name];  ? ROBOTIC ASSISTED LAPAROSCOPIC OVARIAN CYSTECTOMY N/A 12/30/2020  ? Procedure: XI ROBOTIC ASSISTED LAPAROSCOPIC RIGHT SALPINGO-OOPHORECTOMY WITH PELVIC WASHINGS;  [name], MD;  [name];  Service: Gynecology;  [name];  ? WISDOM TOOTH EXTRACTION    ? ?Patient Active Problem List  ? Diagnosis Date Noted  ? Ingrown toenail 07/25/2021  ? Endometriosis   ? Subserous leiomyoma of uterus   ? Anovulatory (dysfunctional uterine) bleeding 12/14/2020  ? Cyst of right ovary 12/14/2020  ? Cough 09/28/2020  ? Genetic testing 07/02/2020  ? Family history of breast cancer   ? Family history of colon cancer   ? Family history of prostate cancer   ? Family history of ovarian cancer   ? Prediabetes    ? Well adult exam 04/12/2020  ? Snorings 04/12/2020  ? PCOS (polycystic ovarian syndrome) 12/04/2019  ? Vitamin D deficiency 03/31/2019  ? Hyperglycemia 03/31/2019  ? Anemia 01/21/2018  ? Midsternal chest pain 11/26/2015  ? Pain in joint, ankle and foot 03/30/2015  ? Apathy 03/26/2014  ? Headache 03/26/2014  ? PMS (premenstrual syndrome) 01/26/2014  ? Anxiety and depression 01/26/2014  ? Fever and chills 09/10/2013  ? Fatigue 09/10/2013  ? Anxiety attack 06/09/2013  ? Unspecified constipation 04/02/2013  ? Diarrhea 04/02/2013  ? Allergic rhinitis 02/01/2012  ? Otitis media 12/12/2011  ? Obesity 03/30/2010  ? Hidradenitis 12/02/2009  ? ? ?PCP: Plotnikov, Evie Lacks, MD ? ?REFERRING PROVIDER: Plotnikov, Evie Lacks, MD ? ?REFERRING DIAG: R35.0 (ICD-10-CM) - Frequency of micturition ?X32.355 (ICD-10-CM) - Other muscle spasm ?N94.89 (ICD-10-CM) - Other specified conditions associated with female genital organs and menstrual cycle ?R10.2 (ICD-10-CM) - Pelvic and perineal pain ? ? ? ?THERAPY DIAG:  ?Muscle weakness (generalized) ? ?Cramp and spasm ? ?Other lack of coordination ? ?ONSET DATE: 12/17/2020 ? ?SUBJECTIVE:                                                                                                                                                                                          ? ?  SUBJECTIVE STATEMENT: ?Pt states that in July 2022 she had cyst on ovary removed and she has been complications. She feels like pelvic floor is very tight and leading to bladder issues. Pt was here for pelvic floor PT, but did not have any internal work done, but she feels more prepared for this at this time.  ?Fluid intake: Yes: 32oz a day   ? ?Patient confirms identification and approves PT to assess pelvic floor and treatment Yes ? ? ?PAIN:  ?Are you having pain? No ? ? ?PRECAUTIONS: None ? ?WEIGHT BEARING RESTRICTIONS No ? ?FALLS:  ?Has patient fallen in last 6 months? No ? ?LIVING ENVIRONMENT: ?Lives with: lives with their  family ?Lives in: House/apartment ? ?OCCUPATION: substitute teacher ? ?PLOF: Independent ? ?PATIENT GOALS She wants to reduce pressure and tightness in pelvic floor ? ?PERTINENT HISTORY:  ?Rt oophorectomy, endometriosis, ovarian cysts, depression, PCOS ?Sexual abuse: No ? ?BOWEL MOVEMENT ?Pain with bowel movement: No ?Type of bowel movement:Frequency 1-2x/day and Strain No ?Fully empty rectum: Yes: - ?Leakage: No ?Pads: No ?Fiber supplement: No ? ?URINATION ?Pain with urination: Yes - pressure ?Fully empty bladder: Yes: - ?Stream: Strong ?Urgency: Yes: pressure and discomfort when she has to go to the bathroom ?Frequency: every 1-2 hours - increased since surgery; she wakes 1-2x/night to urinate ?Leakage:  none ?Pads: No ? ?INTERCOURSE ?Pain with intercourse:  not currently sexually active, no pain in the past ?Ability to have vaginal penetration:  Yes: - ?[place]: not sure ? ? ?PREGNANCY ?Vaginal deliveries 0 ? ?C-section deliveries 0 ?Currently pregnant No ? ? ?OBJECTIVE:  ? ?DIAGNOSTIC FINDINGS:  ?Urodynamics testing ? ?COGNITION: ? Overall cognitive status: Within functional limits for tasks assessed   ?  ?SENSATION: ? Light touch: Appears intact ? Proprioception: Appears intact ? ?POSTURE:  ?Rounded shoulders, forward head, decreased lower thoracic kyphosis, increased anteiror pelvic tilt ? ?LUMBARAROM/PROM:  decreased 25% all motions ? ? ?PELVIC MMT: 2/5, endurance 4 seconds, repetitions 3 (not able to relax in between) ?  ?      PALPATION: ?  General  tenderness to palpation direction over bladder and in Left lower quadrant ? ?              External Perineal Exam WNL ?              ?              Internal Pelvic Floor tenderness and pain throughout superficial and deep pelvic floor bil ? ?TONE: ?High ? ?PROLAPSE: ?WNL ? ?TODAY'S TREATMENT 09/29/21 ?EVAL  ?Manual: ?Soft tissue mobilization: ?Scar tissue mobilization: ?Myofascial release: ?Spinal mobilization: ?Internal pelvic floor techniques: ?Dry  needling: ?Neuromuscular re-education: ?Core retraining:  ?Core facilitation: ?Form correction: ?Pelvic floor contraction training: ?Down training: ?Diaphragmatic breathing ?Happy baby 10 breaths ?Child's pose 10 breaths ?Cat cow 2 x 10 ?Exercises: ?Stretches/mobility: ?Strengthening: ?Therapeutic activities: ?Functional strengthening activities: ?Self-care: ? ? ? ? ?PATIENT EDUCATION:  ?Education details: Pt education performed on exam findings and initial down training program ?Person educated: Patient ?Education method: Explanation, Demonstration, Tactile cues, Verbal cues, and Handouts ?Education comprehension: verbalized understanding ? ? ?HOME EXERCISE PROGRAM: ?[id number] ? ?ASSESSMENT: ? ?CLINICAL IMPRESSION: ?Patient is a 26 y.o. female who was seen today for physical therapy evaluation and treatment for bladder dysfunction and pelvic pain. Exam findings notable for tenderness and restriction in lower abdomen/bladder, tenderness and high tone pelvic floor throughout superficial and deep layers, decreased pelvic floor strength 2/5, decreased pelvic floor endurance 4 seconds, poor coordination  with 3 repetitions of pelvic floor contraction with decreasing relaxation after each, and significant tenderness throughout pelvic floor muscles. Signs and symptoms most consistent with pelvic floor and abdominal muscle tension. She will benefit from skilled PT intervention in order to improve bladder function and decrease pelvic pain.  ? ? ?OBJECTIVE IMPAIRMENTS decreased activity tolerance, decreased coordination, decreased endurance, decreased mobility, decreased strength, hypomobility, increased fascial restrictions, increased muscle spasms, impaired tone, postural dysfunction, and pain.  ? ?ACTIVITY LIMITATIONS  daily activities .  ? ?PERSONAL FACTORS 3+ comorbidities: endometriosis, PCOS, oophorectomy  are also affecting patient's functional outcome.  ? ? ?REHAB POTENTIAL: Good ? ?CLINICAL DECISION MAKING:  Stable/uncomplicated ? ?EVALUATION COMPLEXITY: Low ? ? ?GOALS: ?Goals reviewed with patient? Yes ? ?SHORT TERM GOALS: Target date: 10/27/2021 ? ?Pt will be independent with HEP.  ? ?Baseline: ?Goal status: INITIAL ? ?2.

## 2021-10-04 ENCOUNTER — Other Ambulatory Visit: Payer: Self-pay | Admitting: Internal Medicine

## 2021-10-06 ENCOUNTER — Ambulatory Visit (INDEPENDENT_AMBULATORY_CARE_PROVIDER_SITE_OTHER): Payer: BC Managed Care – PPO | Admitting: Psychology

## 2021-10-06 ENCOUNTER — Ambulatory Visit: Payer: BC Managed Care – PPO

## 2021-10-06 DIAGNOSIS — R252 Cramp and spasm: Secondary | ICD-10-CM

## 2021-10-06 DIAGNOSIS — M6281 Muscle weakness (generalized): Secondary | ICD-10-CM

## 2021-10-06 DIAGNOSIS — F331 Major depressive disorder, recurrent, moderate: Secondary | ICD-10-CM | POA: Diagnosis not present

## 2021-10-06 DIAGNOSIS — R278 Other lack of coordination: Secondary | ICD-10-CM

## 2021-10-06 NOTE — Therapy (Signed)
?OUTPATIENT PHYSICAL THERAPY TREATMENT NOTE ? ? ?Patient Name: Kendra Allen ?MRN: [id number] ?DOB:20-Aug-1995, 26 y.o., female ?Today's Date: 10/06/2021 ? ?PCP: Plotnikov, Evie Lacks, MD ?REFERRING PROVIDER: Lonia Skinner,* ? ?END OF SESSION:  ? PT End of Session - 10/06/21 1445   ? ? Visit Number 2   ? Date for PT Re-Evaluation 12/22/21   ? Authorization Type BCBS   ? PT Start Time 4680   ? PT Stop Time 1525   ? PT Time Calculation (min) 40 min   ? Activity Tolerance Patient tolerated treatment well   ? Behavior During Therapy [facility] for tasks assessed/performed   ? ?  ?  ? ?  ? ? ?Past Medical History:  ?Diagnosis Date  ? Anemia   ? Anxiety   ? Chest pain   ? Constipation   ? Depression   ? Endometriosis of pelvis   ? Family history of breast cancer   ? Family history of colon cancer   ? Family history of ovarian cancer   ? Family history of prostate cancer   ? Hidradenitis suppurativa   ? PCOS (polycystic ovarian syndrome)   ? Prediabetes   ? Sleep apnea   ? Vitamin D deficiency   ? ?Past Surgical History:  ?Procedure Laterality Date  ? CERVICAL POLYPECTOMY    ? DILATATION & CURETTAGE/HYSTEROSCOPY WITH MYOSURE N/A 11/21/2016  ? Procedure: [place];  [name], MD;  Location: [facility];  Service: Gynecology;  [name];  ? ROBOTIC ASSISTED LAPAROSCOPIC OVARIAN CYSTECTOMY N/A 12/30/2020  ? Procedure: XI ROBOTIC ASSISTED LAPAROSCOPIC RIGHT SALPINGO-OOPHORECTOMY WITH PELVIC WASHINGS;  [name], MD;  [name];  Service: Gynecology;  [name];  ? WISDOM TOOTH EXTRACTION    ? ?Patient Active Problem List  ? Diagnosis Date Noted  ? Ingrown toenail 07/25/2021  ? Endometriosis   ? Subserous leiomyoma of uterus   ? Anovulatory (dysfunctional uterine) bleeding 12/14/2020  ? Cyst of right ovary 12/14/2020  ? Cough 09/28/2020  ? Genetic testing 07/02/2020  ? Family history of breast cancer   ? Family history of colon cancer   ?  Family history of prostate cancer   ? Family history of ovarian cancer   ? Prediabetes   ? Well adult exam 04/12/2020  ? Snorings 04/12/2020  ? PCOS (polycystic ovarian syndrome) 12/04/2019  ? Vitamin D deficiency 03/31/2019  ? Hyperglycemia 03/31/2019  ? Anemia 01/21/2018  ? Midsternal chest pain 11/26/2015  ? Pain in joint, ankle and foot 03/30/2015  ? Apathy 03/26/2014  ? Headache 03/26/2014  ? PMS (premenstrual syndrome) 01/26/2014  ? Anxiety and depression 01/26/2014  ? Fever and chills 09/10/2013  ? Fatigue 09/10/2013  ? Anxiety attack 06/09/2013  ? Unspecified constipation 04/02/2013  ? Diarrhea 04/02/2013  ? Allergic rhinitis 02/01/2012  ? Otitis media 12/12/2011  ? Obesity 03/30/2010  ? Hidradenitis 12/02/2009  ? ? ?REFERRING DIAG: H21.224 (ICD-10-CM) - Other muscle spasm ?N94.89 (ICD-10-CM) - Other specified conditions associated with female genital organs and menstrual cycle ? ?THERAPY DIAG:  ?Muscle weakness (generalized) ? ?Cramp and spasm ? ?Other lack of coordination ? ?PERTINENT HISTORY: Rt oophorectomy, endometriosis, ovarian cysts, PCOS ? ?PRECAUTIONS: NA ? ?SUBJECTIVE: Pt states that she did have a little residual soreness after last treatment session, but just for an hour or 2. She feels likes stretches are helping and she is not feeling as much stiffness in her pelvis.  ? ?PAIN:  ?Are you having pain? No ? ? ?  SUBJECTIVE STATEMENT 09/29/2021: ?Pt states that in July 2022 she had cyst on ovary removed and she has been complications. She feels like pelvic floor is very tight and leading to bladder issues. Pt was here for pelvic floor PT, but did not have any internal work done, but she feels more prepared for this at this time.  ?Fluid intake: Yes: 32oz a day   ?  ?Patient confirms identification and approves PT to assess pelvic floor and treatment Yes ?  ? ?  ?PLOF: Independent ?  ?PATIENT GOALS She wants to reduce pressure and tightness in pelvic floor ?  ?PERTINENT HISTORY:  ?Rt oophorectomy,  endometriosis, ovarian cysts, depression, PCOS ?Sexual abuse: No ?  ?BOWEL MOVEMENT ?Pain with bowel movement: No ?Type of bowel movement:Frequency 1-2x/day and Strain No ?Fully empty rectum: Yes: - ?Leakage: No ?Pads: No ?Fiber supplement: No ?  ?URINATION ?Pain with urination: Yes - pressure ?Fully empty bladder: Yes: - ?Stream: Strong ?Urgency: Yes: pressure and discomfort when she has to go to the bathroom ?Frequency: every 1-2 hours - increased since surgery; she wakes 1-2x/night to urinate ?Leakage:  none ?Pads: No ?  ?INTERCOURSE ?Pain with intercourse:  not currently sexually active, no pain in the past ?Ability to have vaginal penetration:  Yes: - ?[place]: not sure ?  ?  ?PREGNANCY ?Vaginal deliveries 0 ?  ?C-section deliveries 0 ?Currently pregnant No ?  ?  ?OBJECTIVE:  ?  ?DIAGNOSTIC FINDINGS:  ?Urodynamics testing ?  ?COGNITION: ?           Overall cognitive status: Within functional limits for tasks assessed              ?            ?SENSATION: ?           Light touch: Appears intact ?           Proprioception: Appears intact ?  ?POSTURE:  ?Rounded shoulders, forward head, decreased lower thoracic kyphosis, increased anteiror pelvic tilt ?  ?LUMBARAROM/PROM:  decreased 25% all motions ?  ?  ?PELVIC MMT: 2/5, endurance 4 seconds, repetitions 3 (not able to relax in between) ?  ?      PALPATION: ?  General  tenderness to palpation direction over bladder and in Left lower quadrant ?  ?              External Perineal Exam WNL ?              ?              Internal Pelvic Floor tenderness and pain throughout superficial and deep pelvic floor bil ?  ?TONE: ?High ?  ?PROLAPSE: ?WNL ?  ?TODAY'S TREATMENT 10/06/21: ?Manual: ?Soft tissue mobilization: ?Abdominal mobilization ?Scar tissue mobilization: ?Myofascial release: ?Spinal mobilization: ?Internal pelvic floor techniques: ?Dry needling: ?Neuromuscular re-education: ?Core retraining:  ?Core facilitation: ?Form correction: ?Pelvic floor contraction  training: ?Down training: ?Exercises: ?Stretches/mobility: ?Bent fall out 10x bil ?Open books 10x bil ?Wagging dog 2 x 10 ?Lateral child's pose 60 sec bil in standing forward fold over table ?Thread the needle 10x bil ?Standing forward fold with bil hip IR 10 deep breaths ?Strengthening: ?Bridge 3 x 10 ?Therapeutic activities: ?Functional strengthening activities: ?Self-care: ?Mindfulness ? ? ?TREATMENT 09/29/21 ?EVAL  ?Manual: ?Soft tissue mobilization: ?Scar tissue mobilization: ?Myofascial release: ?Spinal mobilization: ?Internal pelvic floor techniques: ?Dry needling: ?Neuromuscular re-education: ?Core retraining:  ?Core facilitation: ?Form correction: ?Pelvic floor contraction training: ?Down training: ?Diaphragmatic breathing ?Happy  baby 10 breaths ?Child's pose 10 breaths ?Cat cow 2 x 10 ?Exercises: ?Stretches/mobility: ?Strengthening: ?Therapeutic activities: ?Functional strengthening activities: ?Self-care: ? ?  ?  ?  ?PATIENT EDUCATION:  ?Education details: Pt education on HEP progressions and mindfulness ?Person educated: Patient ?Education method: Explanation, Demonstration, Tactile cues, Verbal cues, and Handouts ?Education comprehension: verbalized understanding ?  ?  ?HOME EXERCISE PROGRAM: ?[id number] ?  ?ASSESSMENT: ?  ?CLINICAL IMPRESSION: ?Pt tolerated abdominal mobilization well with no report of increased pain. Good response to all mobility exercises and gentle strengthening to increase circulation. She has already seen improvements with decreased tension throughout pelvis since working on initial down training program given last week. She most likely will be prepared to return to internal pelvic floor work next treatment session due to progress. Child's pose with lateral stretch was not comfortable for patient, therefore performed in standing. She will benefit from skilled PT intervention in order to improve bladder function and decrease pelvic pain.  ?  ?  ?OBJECTIVE IMPAIRMENTS decreased  activity tolerance, decreased coordination, decreased endurance, decreased mobility, decreased strength, hypomobility, increased fascial restrictions, increased muscle spasms, impaired tone, postural dysfunction, and pa

## 2021-10-06 NOTE — Patient Instructions (Addendum)
Vulvar/vaginal Massage: ?This is a technique to help decrease painful sensitivity in the vaginal area. It ?can also help to restore normal moisture levels in the vaginal tissues. ?With coconut oil, aloe, jojoba oil, or a specific vaginal moisturizer, gently ?massage into vaginal tissues. ?Think of this as part of your post-shower routine and moisturizing just like ?you would the rest of the body with lotion. ?This helps to increase good blood flow to the vaginal tissues. ?In addition, it also teaches the body that touch to the vagina does not have ?to be painful or threatening, but moisturizing and gentle. ?-V-magic is a great option ? ? ?Mindfulness: ?A common source of pelvic floor muscle tension is stress and anxiety. ?Mindfulness training has been shown to be beneficial in reducing stress and ?anxiety by increasing bodily awareness and learning to relax unwanted ?tension. ?A good starting mindfulness practice is by Mitzi Davenport; follow ?instructions below to access free recorded sessions: ?Type Mitzi Davenport into browser, go to website and click guided meditation, listen ?now, pick ?progressive relaxation? or ?relaxation body scan? ?If you do not like these sessions, try typing the above titles into YouTube for ?hundreds of options! ?A great YouTube option is [employer]: 10 minute body scan ?Mindfulness is a very Tree surgeon; therefore, it can take time and trial ?and error in order to find what is most beneficial for you. ?There are also apps you can pay to use: ?Headspace ?Calm ?Insight Timer ?Yogini [name] - 10 minute body scan ?

## 2021-10-06 NOTE — Progress Notes (Signed)
[facility] Counselor/Therapist Progress Note ? ?Patient ID: RONETTE HANK, MRN: [id number],   ? ?Date: 10/06/2021 ? ?Time Spent: 5:00pm-5:45pm   45 minutes  ? ?Treatment Type: Individual Therapy ? ?Reported Symptoms:  stress ? ?Mental Status Exam: ?Appearance:  Casual     ?Behavior: Appropriate  ?Motor: Normal  ?Speech/Language:  Normal Rate  ?Affect: Appropriate  ?Mood: normal  ?Thought process: normal  ?Thought content:   WNL  ?Sensory/Perceptual disturbances:   WNL  ?Orientation: oriented to person, place, time/date, and situation  ?Attention: Good  ?Concentration: Good  ?Memory: WNL  ?Fund of knowledge:  Good  ?Insight:   Good  ?Judgment:  Good  ?Impulse Control: Good  ? ?Risk Assessment: ?Danger to Self:  No ?Self-injurious Behavior: No ?Danger to Others: No ?Duty to Warn:no ?Physical Aggression / Violence:No  ?Access to Firearms a concern: No  ?Gang Involvement:No  ? ?Subjective: Pt present for face-to-face individual therapy via video Webex.  Pt consents to telehealth video session due to COVID 19 pandemic. ?Location of pt: home ?Location of therapist: home office.  ?Pt talked about feeling like she has been doing better bc she has been more productive lately.  Pt registered for classes for the summer and had another information session about the OT program.   Pt started physical therapy for her pelvic floor.   Pt states she is glad she has been productive but it is somewhat "scary" signing up to be busy every day again.  Addressed pt's feelings.  She is anxious about the unknown.   Worked on thought reframing and calming strategies.   ?Pt has been nudging herself to be active and involved with people.   ?Pt has tried to continue to do exercise by doing water aerobics.    ?Worked with pt on self care strategies.   Worked on positive affirmations.   ?Provided supportive therapy.  ? ?Interventions: Cognitive Behavioral Therapy and Insight-Oriented ? ?Diagnosis: F33.1 ? ?Plan:  Plan to continue  to meet every two weeks.   ?Treatment Plan  (treatment plan target date: 06/28/2022). ?Client Abilities/Strengths  ?Pt is bright and motivated for therapy. ?Client Treatment Preferences  ?Individual therapy.  ?Client Statement of Needs  ?Improve coping skills.  ?Symptoms  ?Depressed or irritable mood.  Diminished interest in or enjoyment of activities.  ?Problems Addressed  ?Unipolar Depression ?Goals ?1. Alleviate depressive symptoms and return to previous level of effective functioning. ?2. Appropriately grieve the loss in order to normalize mood and to return to previously adaptive level of functioning. ?Objective ?Learn and implement behavioral strategies to overcome depression. ?Target Date: 2022-06-28 Frequency: Biweekly  ?Progress: 10 Modality: individual  ?Related Interventions ?Engage the client in "behavioral activation," increasing his/her activity level and contact with sources of reward, while identifying processes that inhibit activation.  Use behavioral techniques such as instruction, rehearsal, role-playing, role reversal, as needed, to facilitate activity in the client's daily life; reinforce success. ?Assist the client in developing skills that increase the likelihood of deriving pleasure from behavioral activation (e.g., assertiveness skills, developing an exercise plan, less internal/more external focus, increased social involvement); reinforce success. ?Objective ?Identify important people in life, past and present, and describe the quality, good and poor, of those relationships. ?Target Date: 2022-06-28 Frequency: Biweekly  ?Progress: 10 Modality: individual  ?Related Interventions ?Conduct Interpersonal Therapy beginning with the assessment of the client's "interpersonal inventory" of important past and present relationships; develop a case formulation linking depression to grief, interpersonal role disputes, role transitions, and/or interpersonal deficits). ?Objective ?Learn  and implement  problem-solving and decision-making skills. ?Target Date: 2022-06-28 Frequency: Biweekly  ?Progress: 10 Modality: individual  ?Related Interventions ?Conduct Problem-Solving Therapy using techniques such as psychoeducation, modeling, and role-playing to teach client problem-solving skills (i.e., defining a problem specifically, generating possible solutions, evaluating the pros and cons of each solution, selecting and implementing a plan of action, evaluating the efficacy of the plan, accepting or revising the plan); role-play application of the problem-solving skill to a real life issue. ?Encourage in the client the development of a positive problem orientation in which problems and solving them are viewed as a natural part of life and not something to be feared, despaired, or avoided. ?3. Develop healthy interpersonal relationships that lead to the alleviation and help prevent the relapse of depression. ?4. Develop healthy thinking patterns and beliefs about self, others, and the world that lead to the alleviation and help prevent the relapse of depression. ?5. Recognize, accept, and cope with feelings of depression. ?Diagnosis ?F33.1  ?Conditions For Discharge ?Achievement of treatment goals and objectives  ? ?Shylynn Bruning, LCSW ? ? ?

## 2021-10-13 ENCOUNTER — Ambulatory Visit: Payer: BC Managed Care – PPO

## 2021-10-13 ENCOUNTER — Other Ambulatory Visit: Payer: Self-pay | Admitting: Internal Medicine

## 2021-10-13 DIAGNOSIS — R278 Other lack of coordination: Secondary | ICD-10-CM

## 2021-10-13 DIAGNOSIS — M6281 Muscle weakness (generalized): Secondary | ICD-10-CM | POA: Diagnosis not present

## 2021-10-13 DIAGNOSIS — R252 Cramp and spasm: Secondary | ICD-10-CM

## 2021-10-13 MED ORDER — HYDROXYZINE PAMOATE 25 MG PO CAPS: 25.0000 mg | ORAL_CAPSULE | Freq: Two times a day (BID) | ORAL | 1 refills | Status: DC | PRN

## 2021-10-13 NOTE — Therapy (Signed)
?OUTPATIENT PHYSICAL THERAPY TREATMENT NOTE ? ? ?Patient Name: Kendra Allen ?MRN: [id number] ?DOB:1996-05-22, 26 y.o., female ?Today's Date: 10/13/2021 ? ?PCP: Plotnikov, Evie Lacks, MD ?REFERRING PROVIDER: Lonia Skinner,* ? ?END OF SESSION:  ? PT End of Session - 10/13/21 0935   ? ? Visit Number 3   ? Date for PT Re-Evaluation 12/22/21   ? Authorization Type BCBS   ? PT Start Time [phone]   ? PT Stop Time 1015   ? PT Time Calculation (min) 41 min   ? Activity Tolerance Patient tolerated treatment well   ? Behavior During Therapy [facility] for tasks assessed/performed   ? ?  ?  ? ?  ? ? ? ?Past Medical History:  ?Diagnosis Date  ? Anemia   ? Anxiety   ? Chest pain   ? Constipation   ? Depression   ? Endometriosis of pelvis   ? Family history of breast cancer   ? Family history of colon cancer   ? Family history of ovarian cancer   ? Family history of prostate cancer   ? Hidradenitis suppurativa   ? PCOS (polycystic ovarian syndrome)   ? Prediabetes   ? Sleep apnea   ? Vitamin D deficiency   ? ?Past Surgical History:  ?Procedure Laterality Date  ? CERVICAL POLYPECTOMY    ? DILATATION & CURETTAGE/HYSTEROSCOPY WITH MYOSURE N/A 11/21/2016  ? Procedure: [place];  [name], MD;  Location: [facility];  Service: Gynecology;  [name];  ? ROBOTIC ASSISTED LAPAROSCOPIC OVARIAN CYSTECTOMY N/A 12/30/2020  ? Procedure: XI ROBOTIC ASSISTED LAPAROSCOPIC RIGHT SALPINGO-OOPHORECTOMY WITH PELVIC WASHINGS;  [name], MD;  [name];  Service: Gynecology;  [name];  ? WISDOM TOOTH EXTRACTION    ? ?Patient Active Problem List  ? Diagnosis Date Noted  ? Ingrown toenail 07/25/2021  ? Endometriosis   ? Subserous leiomyoma of uterus   ? Anovulatory (dysfunctional uterine) bleeding 12/14/2020  ? Cyst of right ovary 12/14/2020  ? Cough 09/28/2020  ? Genetic testing 07/02/2020  ? Family history of breast cancer   ? Family history of colon cancer    ? Family history of prostate cancer   ? Family history of ovarian cancer   ? Prediabetes   ? Well adult exam 04/12/2020  ? Snorings 04/12/2020  ? PCOS (polycystic ovarian syndrome) 12/04/2019  ? Vitamin D deficiency 03/31/2019  ? Hyperglycemia 03/31/2019  ? Anemia 01/21/2018  ? Midsternal chest pain 11/26/2015  ? Pain in joint, ankle and foot 03/30/2015  ? Apathy 03/26/2014  ? Headache 03/26/2014  ? PMS (premenstrual syndrome) 01/26/2014  ? Anxiety and depression 01/26/2014  ? Fever and chills 09/10/2013  ? Fatigue 09/10/2013  ? Anxiety attack 06/09/2013  ? Unspecified constipation 04/02/2013  ? Diarrhea 04/02/2013  ? Allergic rhinitis 02/01/2012  ? Otitis media 12/12/2011  ? Obesity 03/30/2010  ? Hidradenitis 12/02/2009  ? ? ?REFERRING DIAG: E31.540 (ICD-10-CM) - Other muscle spasm ?N94.89 (ICD-10-CM) - Other specified conditions associated with female genital organs and menstrual cycle ? ?THERAPY DIAG:  ?Muscle weakness (generalized) ? ?Cramp and spasm ? ?Other lack of coordination ? ?PERTINENT HISTORY: Rt oophorectomy, endometriosis, ovarian cysts, PCOS ? ?PRECAUTIONS: NA ? ?SUBJECTIVE: Pt states that she felt fine after last treatment session and she is not feeling as much lower abdominal pressure as before. She states that urinary urgency is getting better. She is waking up 1x/night.  ? ?PAIN:  ?Are you having pain? No ? ? ?  SUBJECTIVE STATEMENT 09/29/2021: ?Pt states that in July 2022 she had cyst on ovary removed and she has been complications. She feels like pelvic floor is very tight and leading to bladder issues. Pt was here for pelvic floor PT, but did not have any internal work done, but she feels more prepared for this at this time.  ?Fluid intake: Yes: 32oz a day   ?  ?Patient confirms identification and approves PT to assess pelvic floor and treatment Yes ?  ? ?  ?PLOF: Independent ?  ?PATIENT GOALS She wants to reduce pressure and tightness in pelvic floor ?  ?PERTINENT HISTORY:  ?Rt oophorectomy,  endometriosis, ovarian cysts, depression, PCOS ?Sexual abuse: No ?  ?BOWEL MOVEMENT ?Pain with bowel movement: No ?Type of bowel movement:Frequency 1-2x/day and Strain No ?Fully empty rectum: Yes: - ?Leakage: No ?Pads: No ?Fiber supplement: No ?  ?URINATION ?Pain with urination: Yes - pressure ?Fully empty bladder: Yes: - ?Stream: Strong ?Urgency: Yes: pressure and discomfort when she has to go to the bathroom ?Frequency: every 1-2 hours - increased since surgery; she wakes 1-2x/night to urinate ?Leakage:  none ?Pads: No ?  ?INTERCOURSE ?Pain with intercourse:  not currently sexually active, no pain in the past ?Ability to have vaginal penetration:  Yes: - ?[place]: not sure ?  ?  ?PREGNANCY ?Vaginal deliveries 0 ?  ?C-section deliveries 0 ?Currently pregnant No ?  ?  ?OBJECTIVE:  ?  ?DIAGNOSTIC FINDINGS:  ?Urodynamics testing ?  ?COGNITION: ?           Overall cognitive status: Within functional limits for tasks assessed              ?            ?SENSATION: ?           Light touch: Appears intact ?           Proprioception: Appears intact ?  ?POSTURE:  ?Rounded shoulders, forward head, decreased lower thoracic kyphosis, increased anteiror pelvic tilt ?  ?LUMBARAROM/PROM:  decreased 25% all motions ?  ?  ?PELVIC MMT: 2/5, endurance 4 seconds, repetitions 3 (not able to relax in between) ?  ?      PALPATION: ?  General  tenderness to palpation direction over bladder and in Left lower quadrant ?  ?              External Perineal Exam WNL ?              ?              Internal Pelvic Floor tenderness and pain throughout superficial and deep pelvic floor bil ?  ?TONE: ?High ?  ?PROLAPSE: ?WNL ?  ?TODAY'S TREATMENT 10/13/21: ?Manual: ?Soft tissue mobilization: ?Bil adductors ?Scar tissue mobilization: ?Myofascial release: ?Spinal mobilization: ?Internal pelvic floor techniques: ?Pelvic floor muscle release to superficial and deep pelvic floor muscles ?Dry needling: ?Neuromuscular re-education: ?Core retraining:  ?Core  facilitation: ?Form correction: ?Pelvic floor contraction training: ?Down training: ?Diaphragmatic breathing ?Body scan relaxation ?Exercises: ?Stretches/mobility: ?Strengthening: ?Therapeutic activities: ?Functional strengthening activities: ?Self-care: ? ? ? ?TREATMENT 10/06/21: ?Manual: ?Soft tissue mobilization: ?Abdominal mobilization ?Scar tissue mobilization: ?Myofascial release: ?Spinal mobilization: ?Internal pelvic floor techniques: ?Dry needling: ?Neuromuscular re-education: ?Core retraining:  ?Core facilitation: ?Form correction: ?Pelvic floor contraction training: ?Down training: ?Exercises: ?Stretches/mobility: ?Bent fall out 10x bil ?Open books 10x bil ?Wagging dog 2 x 10 ?Lateral child's pose 60 sec bil in standing forward fold over table ?Thread the needle 10x  bil ?Standing forward fold with bil hip IR 10 deep breaths ?Strengthening: ?Bridge 3 x 10 ?Therapeutic activities: ?Functional strengthening activities: ?Self-care: ?Mindfulness ? ? ?TREATMENT 09/29/21 ?EVAL  ?Manual: ?Soft tissue mobilization: ?Scar tissue mobilization: ?Myofascial release: ?Spinal mobilization: ?Internal pelvic floor techniques: ?Dry needling: ?Neuromuscular re-education: ?Core retraining:  ?Core facilitation: ?Form correction: ?Pelvic floor contraction training: ?Down training: ?Diaphragmatic breathing ?Happy baby 10 breaths ?Child's pose 10 breaths ?Cat cow 2 x 10 ?Exercises: ?Stretches/mobility: ?Strengthening: ?Therapeutic activities: ?Functional strengthening activities: ?Self-care: ? ?  ?  ?  ?PATIENT EDUCATION:  ?Education details:  ?Person educated: Patient ?Education method: Explanation, Demonstration, Tactile cues, Verbal cues, and Handouts ?Education comprehension: verbalized understanding ?  ?  ?HOME EXERCISE PROGRAM: ?[id number] ?  ?ASSESSMENT: ?  ?CLINICAL IMPRESSION: ?Pt continuing to see progress with down training demonstrated by improving urgency and less abdominal pressure. Internal pelvic  release/desensitization performed this session with good tolerance, by significant sensitivity. She was encouraged to work on mindfulness over the next week. Adductor mobilization performed due to tightness here that is most l

## 2021-10-20 ENCOUNTER — Ambulatory Visit: Payer: BC Managed Care – PPO | Admitting: Psychology

## 2021-10-20 ENCOUNTER — Ambulatory Visit: Payer: BC Managed Care – PPO | Attending: Obstetrics and Gynecology

## 2021-10-20 DIAGNOSIS — R252 Cramp and spasm: Secondary | ICD-10-CM

## 2021-10-20 DIAGNOSIS — R278 Other lack of coordination: Secondary | ICD-10-CM

## 2021-10-20 DIAGNOSIS — M6281 Muscle weakness (generalized): Secondary | ICD-10-CM | POA: Diagnosis present

## 2021-10-20 NOTE — Progress Notes (Incomplete)
[facility] Counselor/Therapist Progress Note ? ?Patient ID: XYLIA SCHERGER, MRN: [id number],   ? ?Date: 10/20/2021 ? ?Time Spent: 2:00pm-2:45pm   45 minutes  ? ?Treatment Type: Individual Therapy ? ?Reported Symptoms:  stress ? ?Mental Status Exam: ?Appearance:  Casual     ?Behavior: Appropriate  ?Motor: Normal  ?Speech/Language:  Normal Rate  ?Affect: Appropriate  ?Mood: normal  ?Thought process: normal  ?Thought content:   WNL  ?Sensory/Perceptual disturbances:   WNL  ?Orientation: oriented to person, place, time/date, and situation  ?Attention: Good  ?Concentration: Good  ?Memory: WNL  ?Fund of knowledge:  Good  ?Insight:   Good  ?Judgment:  Good  ?Impulse Control: Good  ? ?Risk Assessment: ?Danger to Self:  No ?Self-injurious Behavior: No ?Danger to Others: No ?Duty to Warn:no ?Physical Aggression / Violence:No  ?Access to Firearms a concern: No  ?Gang Involvement:No  ? ?Subjective: Pt present for face-to-face individual therapy via video Webex.  Pt consents to telehealth video session due to COVID 19 pandemic. ?Location of pt: home ?Location of therapist: home office.  ? Addressed pt's feelings.  She is anxious about the unknown.   Worked on thought reframing and calming strategies.   ?Pt has been nudging herself to be active and involved with people.   ?Pt has tried to continue to do exercise by doing water aerobics.    ?Worked with pt on self care strategies.   Worked on positive affirmations.   ?Provided supportive therapy.  ? ?Interventions: Cognitive Behavioral Therapy and Insight-Oriented ? ?Diagnosis: F33.1 ? ?Plan:  Plan to continue to meet every two weeks.   ?Treatment Plan  (treatment plan target date: 06/28/2022). ?Client Abilities/Strengths  ?Pt is bright and motivated for therapy. ?Client Treatment Preferences  ?Individual therapy.  ?Client Statement of Needs  ?Improve coping skills.  ?Symptoms  ?Depressed or irritable mood.  Diminished interest in or enjoyment of activities.   ?Problems Addressed  ?Unipolar Depression ?Goals ?1. Alleviate depressive symptoms and return to previous level of effective functioning. ?2. Appropriately grieve the loss in order to normalize mood and to return to previously adaptive level of functioning. ?Objective ?Learn and implement behavioral strategies to overcome depression. ?Target Date: 2022-06-28 Frequency: Biweekly  ?Progress: 10 Modality: individual  ?Related Interventions ?Engage the client in "behavioral activation," increasing his/her activity level and contact with sources of reward, while identifying processes that inhibit activation.  Use behavioral techniques such as instruction, rehearsal, role-playing, role reversal, as needed, to facilitate activity in the client's daily life; reinforce success. ?Assist the client in developing skills that increase the likelihood of deriving pleasure from behavioral activation (e.g., assertiveness skills, developing an exercise plan, less internal/more external focus, increased social involvement); reinforce success. ?Objective ?Identify important people in life, past and present, and describe the quality, good and poor, of those relationships. ?Target Date: 2022-06-28 Frequency: Biweekly  ?Progress: 10 Modality: individual  ?Related Interventions ?Conduct Interpersonal Therapy beginning with the assessment of the client's "interpersonal inventory" of important past and present relationships; develop a case formulation linking depression to grief, interpersonal role disputes, role transitions, and/or interpersonal deficits). ?Objective ?Learn and implement problem-solving and decision-making skills. ?Target Date: 2022-06-28 Frequency: Biweekly  ?Progress: 10 Modality: individual  ?Related Interventions ?Conduct Problem-Solving Therapy using techniques such as psychoeducation, modeling, and role-playing to teach client problem-solving skills (i.e., defining a problem specifically, generating possible  solutions, evaluating the pros and cons of each solution, selecting and implementing a plan of action, evaluating the efficacy of the plan, accepting or revising  the plan); role-play application of the problem-solving skill to a real life issue. ?Encourage in the client the development of a positive problem orientation in which problems and solving them are viewed as a natural part of life and not something to be feared, despaired, or avoided. ?3. Develop healthy interpersonal relationships that lead to the alleviation and help prevent the relapse of depression. ?4. Develop healthy thinking patterns and beliefs about self, others, and the world that lead to the alleviation and help prevent the relapse of depression. ?5. Recognize, accept, and cope with feelings of depression. ?Diagnosis ?F33.1  ?Conditions For Discharge ?Achievement of treatment goals and objectives  ? ?Raffaella Edison, LCSW ? ? ?

## 2021-10-20 NOTE — Therapy (Signed)
?OUTPATIENT PHYSICAL THERAPY TREATMENT NOTE ? ? ?Patient Name: Kendra Allen ?MRN: [id number] ?DOB:[birth year], 26 y.o., female ?Today's Date: 10/20/2021 ? ?PCP: Plotnikov, Evie Lacks, MD ?REFERRING PROVIDER: Lonia Skinner,* ? ?END OF SESSION:  ? PT End of Session - 10/20/21 0935   ? ? Visit Number 4   ? Date for PT Re-Evaluation 12/22/21   ? Authorization Type BCBS   ? PT Start Time [phone]   ? PT Stop Time 1012   ? PT Time Calculation (min) 38 min   ? Activity Tolerance Patient tolerated treatment well   ? Behavior During Therapy [facility] for tasks assessed/performed   ? ?  ?  ? ?  ? ? ? ? ?Past Medical History:  ?Diagnosis Date  ? Anemia   ? Anxiety   ? Chest pain   ? Constipation   ? Depression   ? Endometriosis of pelvis   ? Family history of breast cancer   ? Family history of colon cancer   ? Family history of ovarian cancer   ? Family history of prostate cancer   ? Hidradenitis suppurativa   ? PCOS (polycystic ovarian syndrome)   ? Prediabetes   ? Sleep apnea   ? Vitamin D deficiency   ? ?Past Surgical History:  ?Procedure Laterality Date  ? CERVICAL POLYPECTOMY    ? DILATATION & CURETTAGE/HYSTEROSCOPY WITH MYOSURE N/A 11/21/2016  ? Procedure: [place];  [name], MD;  Location: [facility];  Service: Gynecology;  [name];  ? ROBOTIC ASSISTED LAPAROSCOPIC OVARIAN CYSTECTOMY N/A 12/30/2020  ? Procedure: XI ROBOTIC ASSISTED LAPAROSCOPIC RIGHT SALPINGO-OOPHORECTOMY WITH PELVIC WASHINGS;  [name], MD;  [name];  Service: Gynecology;  [name];  ? WISDOM TOOTH EXTRACTION    ? ?Patient Active Problem List  ? Diagnosis Date Noted  ? Ingrown toenail 07/25/2021  ? Endometriosis   ? Subserous leiomyoma of uterus   ? Anovulatory (dysfunctional uterine) bleeding 12/14/2020  ? Cyst of right ovary 12/14/2020  ? Cough 09/28/2020  ? Genetic testing 07/02/2020  ? Family history of breast cancer   ? Family history of colon cancer    ? Family history of prostate cancer   ? Family history of ovarian cancer   ? Prediabetes   ? Well adult exam 04/12/2020  ? [place] 04/12/2020  ? PCOS (polycystic ovarian syndrome) 12/04/2019  ? Vitamin D deficiency 03/31/2019  ? Hyperglycemia 03/31/2019  ? Anemia 01/21/2018  ? Midsternal chest pain 11/26/2015  ? Pain in joint, ankle and foot 03/30/2015  ? Apathy 03/26/2014  ? Headache 03/26/2014  ? PMS (premenstrual syndrome) 01/26/2014  ? Anxiety and depression 01/26/2014  ? Fever and chills 09/10/2013  ? Fatigue 09/10/2013  ? Anxiety attack 06/09/2013  ? Unspecified constipation 04/02/2013  ? Diarrhea 04/02/2013  ? Allergic rhinitis 02/01/2012  ? Otitis media 12/12/2011  ? Obesity 03/30/2010  ? Hidradenitis 12/02/2009  ? ? ?REFERRING DIAG: L93.570 (ICD-10-CM) - Other muscle spasm ?N94.89 (ICD-10-CM) - Other specified conditions associated with female genital organs and menstrual cycle ? ?THERAPY DIAG:  ?Muscle weakness (generalized) ? ?Cramp and spasm ? ?Other lack of coordination ? ?PERTINENT HISTORY: Rt oophorectomy, endometriosis, ovarian cysts, PCOS ? ?PRECAUTIONS: NA ? ?SUBJECTIVE: Pt states that she is noticing a difference in symptoms on days when she does her stretches and that they are very helpful. She Feels like urgency continues to improve. She is still waking 1x/night, but it's much closer to when she is getting up.  ? ?  PAIN:  ?Are you having pain? No ? ? ?SUBJECTIVE STATEMENT 09/29/2021: ?Pt states that in July 2022 she had cyst on ovary removed and she has been complications. She feels like pelvic floor is very tight and leading to bladder issues. Pt was here for pelvic floor PT, but did not have any internal work done, but she feels more prepared for this at this time.  ?Fluid intake: Yes: 32oz a day   ?  ?Patient confirms identification and approves PT to assess pelvic floor and treatment Yes ?  ? ?  ?PLOF: Independent ?  ?PATIENT GOALS She wants to reduce pressure and tightness in pelvic floor ?   ?PERTINENT HISTORY:  ?Rt oophorectomy, endometriosis, ovarian cysts, depression, PCOS ?Sexual abuse: No ?  ?BOWEL MOVEMENT ?Pain with bowel movement: No ?Type of bowel movement:Frequency 1-2x/day and Strain No ?Fully empty rectum: Yes: - ?Leakage: No ?Pads: No ?Fiber supplement: No ?  ?URINATION ?Pain with urination: Yes - pressure ?Fully empty bladder: Yes: - ?Stream: Strong ?Urgency: Yes: pressure and discomfort when she has to go to the bathroom ?Frequency: every 1-2 hours - increased since surgery; she wakes 1-2x/night to urinate ?Leakage:  none ?Pads: No ?  ?INTERCOURSE ?Pain with intercourse:  not currently sexually active, no pain in the past ?Ability to have vaginal penetration:  Yes: - ?[place]: not sure ?  ?  ?PREGNANCY ?Vaginal deliveries 0 ?  ?C-section deliveries 0 ?Currently pregnant No ?  ?  ?OBJECTIVE:  ?  ?DIAGNOSTIC FINDINGS:  ?Urodynamics testing ?  ?COGNITION: ?           Overall cognitive status: Within functional limits for tasks assessed              ?            ?SENSATION: ?           Light touch: Appears intact ?           Proprioception: Appears intact ?  ?POSTURE:  ?Rounded shoulders, forward head, decreased lower thoracic kyphosis, increased anteiror pelvic tilt ?  ?LUMBARAROM/PROM:  decreased 25% all motions ?  ?  ?PELVIC MMT: 2/5, endurance 4 seconds, repetitions 3 (not able to relax in between) ?  ?      PALPATION: ?  General  tenderness to palpation direction over bladder and in Left lower quadrant ?  ?              External Perineal Exam WNL ?              ?              Internal Pelvic Floor tenderness and pain throughout superficial and deep pelvic floor bil ?  ?TONE: ?High ?  ?PROLAPSE: ?WNL ?  ?TODAY'S TREATMENT 10/20/21: ?Manual: ?Soft tissue mobilization: ?Scar tissue mobilization: ?Myofascial release: ?Spinal mobilization: ?Internal pelvic floor techniques: ?Pelvic floor muscle release to superficial and deep pelvic floor muscles ?Dry needling: ?Neuromuscular  re-education: ?Core retraining:  ?Core facilitation: ?Form correction: ?Pelvic floor contraction training: ?Down training: ?Exercises: ?Stretches/mobility: ?Kneeling hip flexor stretch 2 x 60 sec bil ?Frog stretch 2 x 60  ?Crescent lunge 2 min bil ?Crescent lunge wind mill 5x bil ?Pigeon 2 min bil ?Strengthening: ?Therapeutic activities: ?Functional strengthening activities: ?Self-care: ? ? ? ?TREATMENT 10/13/21: ?Manual: ?Soft tissue mobilization: ?Bil adductors ?Scar tissue mobilization: ?Myofascial release: ?Spinal mobilization: ?Internal pelvic floor techniques: ?Pelvic floor muscle release to superficial and deep pelvic floor muscles ?Dry needling: ?Neuromuscular re-education: ?Core retraining:  ?  Core facilitation: ?Form correction: ?Pelvic floor contraction training: ?Down training: ?Diaphragmatic breathing ?Body scan relaxation ?Exercises: ?Stretches/mobility: ?Strengthening: ?Therapeutic activities: ?Functional strengthening activities: ?Self-care: ? ? ? ?TREATMENT 10/06/21: ?Manual: ?Soft tissue mobilization: ?Abdominal mobilization ?Scar tissue mobilization: ?Myofascial release: ?Spinal mobilization: ?Internal pelvic floor techniques: ?Dry needling: ?Neuromuscular re-education: ?Core retraining:  ?Core facilitation: ?Form correction: ?Pelvic floor contraction training: ?Down training: ?Exercises: ?Stretches/mobility: ?Bent fall out 10x bil ?Open books 10x bil ?Wagging dog 2 x 10 ?Lateral child's pose 60 sec bil in standing forward fold over table ?Thread the needle 10x bil ?Standing forward fold with bil hip IR 10 deep breaths ?Strengthening: ?Bridge 3 x 10 ?Therapeutic activities: ?Functional strengthening activities: ?Self-care: ?Mindfulness ? ? ?  ?PATIENT EDUCATION:  ?Education details: HEP progressions ?Person educated: Patient ?Education method: Explanation, Demonstration, Tactile cues, Verbal cues, and Handouts ?Education comprehension: verbalized understanding ?  ?  ?HOME EXERCISE  PROGRAM: ?[id number] ?  ?ASSESSMENT: ?  ?CLINICAL IMPRESSION: ?Patient doing very well with reduced urgency, decrease in nocturia, and less abdominal pressure/discomfort. Good tolerance to all mobility progressions with focus on adductor

## 2021-10-21 ENCOUNTER — Ambulatory Visit (INDEPENDENT_AMBULATORY_CARE_PROVIDER_SITE_OTHER): Payer: BC Managed Care – PPO | Admitting: Psychology

## 2021-10-21 DIAGNOSIS — F331 Major depressive disorder, recurrent, moderate: Secondary | ICD-10-CM

## 2021-10-21 NOTE — Progress Notes (Signed)
[facility] Counselor/Therapist Progress Note ? ?Patient ID: Kendra Allen, MRN: [id number],   ? ?Date: 10/21/2021 ? ?Time Spent: 4:00pm-4:45pm   45 minutes  ? ?Treatment Type: Individual Therapy ? ?Reported Symptoms:  stress ? ?Mental Status Exam: ?Appearance:  Casual     ?Behavior: Appropriate  ?Motor: Normal  ?Speech/Language:  Normal Rate  ?Affect: Appropriate  ?Mood: normal  ?Thought process: normal  ?Thought content:   WNL  ?Sensory/Perceptual disturbances:   WNL  ?Orientation: oriented to person, place, time/date, and situation  ?Attention: Good  ?Concentration: Good  ?Memory: WNL  ?Fund of knowledge:  Good  ?Insight:   Good  ?Judgment:  Good  ?Impulse Control: Good  ? ?Risk Assessment: ?Danger to Self:  No ?Self-injurious Behavior: No ?Danger to Others: No ?Duty to Warn:no ?Physical Aggression / Violence:No  ?Access to Firearms a concern: No  ?Gang Involvement:No  ? ?Subjective: Pt present for face-to-face individual therapy via video Webex.  Pt consents to telehealth video session due to COVID 19 pandemic. ?Location of pt: home ?Location of therapist: home office.  ?Pt talked about "doing better".   She states she is becoming more functional.   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    ?She is doing more with friends.   ?At times pt feels anxious about things like going out during the day.  She also gets anxious if the day feels over scheduled.  Addressed what pt does to calm herself.  She utilizes deep breathing techniques and mindfulness.   ?Pt is still exercising each day.  She either goes to water aerobics or goes to a park to walk.   ?Pt makes sure she does something to get out of the house each day.   ?Sometimes pt still has moments where doing "normal stuff" like cooking feels like it takes so much energy.  Worked with pt on how she can pace herself.    ?Worked on self care strategies.   Worked on positive  affirmations.   ?Provided supportive therapy.  ? ?Interventions: Cognitive Behavioral Therapy and Insight-Oriented ? ?Diagnosis: F33.1 ? ?Plan:  Plan to continue to meet every two weeks.   ?Treatment Plan  (treatment plan target date: 06/28/2022). ?Client Abilities/Strengths  ?Pt is bright and motivated for therapy. ?Client Treatment Preferences  ?Individual therapy.  ?Client Statement of Needs  ?Improve coping skills.  ?Symptoms  ?Depressed or irritable mood.  Diminished interest in or enjoyment of activities.  ?Problems Addressed  ?Unipolar Depression ?Goals ?1. Alleviate depressive symptoms and return to previous level of effective functioning. ?2. Appropriately grieve the loss in order to normalize mood and to return to previously adaptive level of functioning. ?Objective ?Learn and implement behavioral strategies to overcome depression. ?Target Date: 2022-06-28 Frequency: Biweekly  ?Progress: 10 Modality: individual  ?Related Interventions ?Engage the client in "behavioral activation," increasing his/her activity level and contact with sources of reward, while identifying processes that inhibit activation.  Use behavioral techniques such as instruction, rehearsal, role-playing, role reversal, as needed, to facilitate activity in the client's daily life; reinforce success. ?Assist the client in developing skills that increase the likelihood of deriving pleasure from behavioral activation (e.g., assertiveness skills, developing an exercise plan, less internal/more external focus, increased social involvement); reinforce success. ?Objective ?Identify important people in life, past and present, and describe the quality, good and poor, of those relationships. ?Target Date: 2022-06-28 Frequency: Biweekly  ?Progress: 10 Modality:  individual  ?Related Interventions ?Conduct Interpersonal Therapy beginning with the assessment of the client's "interpersonal inventory" of important past and present relationships; develop a  case formulation linking depression to grief, interpersonal role disputes, role transitions, and/or interpersonal deficits). ?Objective ?Learn and implement problem-solving and decision-making skills. ?Target Date: 2022-06-28 Frequency: Biweekly  ?Progress: 10 Modality: individual  ?Related Interventions ?Conduct Problem-Solving Therapy using techniques such as psychoeducation, modeling, and role-playing to teach client problem-solving skills (i.e., defining a problem specifically, generating possible solutions, evaluating the pros and cons of each solution, selecting and implementing a plan of action, evaluating the efficacy of the plan, accepting or revising the plan); role-play application of the problem-solving skill to a real life issue. ?Encourage in the client the development of a positive problem orientation in which problems and solving them are viewed as a natural part of life and not something to be feared, despaired, or avoided. ?3. Develop healthy interpersonal relationships that lead to the alleviation and help prevent the relapse of depression. ?4. Develop healthy thinking patterns and beliefs about self, others, and the world that lead to the alleviation and help prevent the relapse of depression. ?5. Recognize, accept, and cope with feelings of depression. ?Diagnosis ?F33.1  ?Conditions For Discharge ?Achievement of treatment goals and objectives  ? ?Cherolyn Behrle, LCSW ? ? ? ?

## 2021-10-27 ENCOUNTER — Ambulatory Visit: Payer: BC Managed Care – PPO

## 2021-10-27 DIAGNOSIS — M6281 Muscle weakness (generalized): Secondary | ICD-10-CM

## 2021-10-27 DIAGNOSIS — R278 Other lack of coordination: Secondary | ICD-10-CM

## 2021-10-27 DIAGNOSIS — R252 Cramp and spasm: Secondary | ICD-10-CM

## 2021-10-27 NOTE — Therapy (Addendum)
?OUTPATIENT PHYSICAL THERAPY TREATMENT NOTE ? ? ?Patient Name: Kendra Allen ?MRN: [id number] ?DOB:08-29-1995, 26 y.o., female ?Today's Date: 10/27/2021 ? ?PCP: Plotnikov, Evie Lacks, MD ?REFERRING PROVIDER: Lonia Skinner,* ? ?END OF SESSION:  ? PT End of Session - 10/27/21 1453   ? ? Visit Number 5   ? Date for PT Re-Evaluation 12/22/21   ? Authorization Type BCBS   ? PT Start Time 1452   ? PT Stop Time 1525   ? PT Time Calculation (min) 33 min   ? Activity Tolerance Patient tolerated treatment well   ? Behavior During Therapy [facility] for tasks assessed/performed   ? ?  ?  ? ?  ? ? ? ? ?Past Medical History:  ?Diagnosis Date  ? Anemia   ? Anxiety   ? Chest pain   ? Constipation   ? Depression   ? Endometriosis of pelvis   ? Family history of breast cancer   ? Family history of colon cancer   ? Family history of ovarian cancer   ? Family history of prostate cancer   ? Hidradenitis suppurativa   ? PCOS (polycystic ovarian syndrome)   ? Prediabetes   ? Sleep apnea   ? Vitamin D deficiency   ? ?Past Surgical History:  ?Procedure Laterality Date  ? CERVICAL POLYPECTOMY    ? DILATATION & CURETTAGE/HYSTEROSCOPY WITH MYOSURE N/A 11/21/2016  ? Procedure: [place];  [name], MD;  Location: [facility];  Service: Gynecology;  [name];  ? ROBOTIC ASSISTED LAPAROSCOPIC OVARIAN CYSTECTOMY N/A 12/30/2020  ? Procedure: XI ROBOTIC ASSISTED LAPAROSCOPIC RIGHT SALPINGO-OOPHORECTOMY WITH PELVIC WASHINGS;  [name], MD;  [name];  Service: Gynecology;  [name];  ? WISDOM TOOTH EXTRACTION    ? ?Patient Active Problem List  ? Diagnosis Date Noted  ? Ingrown toenail 07/25/2021  ? Endometriosis   ? Subserous leiomyoma of uterus   ? Anovulatory (dysfunctional uterine) bleeding 12/14/2020  ? Cyst of right ovary 12/14/2020  ? Cough 09/28/2020  ? Genetic testing 07/02/2020  ? Family history of breast cancer   ? Family history of colon cancer    ? Family history of prostate cancer   ? Family history of ovarian cancer   ? Prediabetes   ? Well adult exam 04/12/2020  ? [place] 04/12/2020  ? PCOS (polycystic ovarian syndrome) 12/04/2019  ? Vitamin D deficiency 03/31/2019  ? Hyperglycemia 03/31/2019  ? Anemia 01/21/2018  ? Midsternal chest pain 11/26/2015  ? Pain in joint, ankle and foot 03/30/2015  ? Apathy 03/26/2014  ? Headache 03/26/2014  ? PMS (premenstrual syndrome) 01/26/2014  ? Anxiety and depression 01/26/2014  ? Fever and chills 09/10/2013  ? Fatigue 09/10/2013  ? Anxiety attack 06/09/2013  ? Unspecified constipation 04/02/2013  ? Diarrhea 04/02/2013  ? Allergic rhinitis 02/01/2012  ? Otitis media 12/12/2011  ? Obesity 03/30/2010  ? Hidradenitis 12/02/2009  ? ? ?REFERRING DIAG: W10.932 (ICD-10-CM) - Other muscle spasm ?N94.89 (ICD-10-CM) - Other specified conditions associated with female genital organs and menstrual cycle ? ?THERAPY DIAG:  ?Muscle weakness (generalized) ? ?Cramp and spasm ? ?Other lack of coordination ? ?PERTINENT HISTORY: Rt oophorectomy, endometriosis, ovarian cysts, PCOS ? ?PRECAUTIONS: NA ? ?SUBJECTIVE: Pt states that she is noticing a difference in symptoms on days when she does her stretches and that they are very helpful. She Feels like urgency continues to improve. She is still waking 1x/night, but it's much closer to when she is getting up.  ? ?  PAIN:  ?Are you having pain? No ? ? ?SUBJECTIVE STATEMENT 09/29/2021: ?Pt states that in July 2022 she had cyst on ovary removed and she has been complications. She feels like pelvic floor is very tight and leading to bladder issues. Pt was here for pelvic floor PT, but did not have any internal work done, but she feels more prepared for this at this time.  ?Fluid intake: Yes: 32oz a day   ?  ?Patient confirms identification and approves PT to assess pelvic floor and treatment Yes ?  ? ?  ?PLOF: Independent ?  ?PATIENT GOALS She wants to reduce pressure and tightness in pelvic  floor ?  ?PERTINENT HISTORY:  ?Rt oophorectomy, endometriosis, ovarian cysts, depression, PCOS ?Sexual abuse: No ?  ?BOWEL MOVEMENT ?Pain with bowel movement: No ?Type of bowel movement:Frequency 1-2x/day and Strain No ?Fully empty rectum: Yes: - ?Leakage: No ?Pads: No ?Fiber supplement: No ?  ?URINATION ?Pain with urination: Yes - pressure ?Fully empty bladder: Yes: - ?Stream: Strong ?Urgency: Yes: pressure and discomfort when she has to go to the bathroom ?Frequency: every 1-2 hours - increased since surgery; she wakes 1-2x/night to urinate ?Leakage:  none ?Pads: No ?  ?INTERCOURSE ?Pain with intercourse:  not currently sexually active, no pain in the past ?Ability to have vaginal penetration:  Yes: - ?Climax: not sure ?  ?  ?PREGNANCY ?Vaginal deliveries 0 ?  ?C-section deliveries 0 ?Currently pregnant No ?  ?  ?OBJECTIVE:  ?  ?DIAGNOSTIC FINDINGS:  ?Urodynamics testing ?  ?COGNITION: ?           Overall cognitive status: Within functional limits for tasks assessed              ?            ?SENSATION: ?           Light touch: Appears intact ?           Proprioception: Appears intact ?  ?POSTURE:  ?Rounded shoulders, forward head, decreased lower thoracic kyphosis, increased anteiror pelvic tilt ?  ?LUMBARAROM/PROM:  decreased 25% all motions ?  ?  ?PELVIC MMT: 2/5, endurance 4 seconds, repetitions 3 (not able to relax in between) ?  ?      PALPATION: ?  General  tenderness to palpation direction over bladder and in Left lower quadrant ?  ?              External Perineal Exam WNL ?              ?              Internal Pelvic Floor tenderness and pain throughout superficial and deep pelvic floor bil ?  ?TONE: ?High ?  ?PROLAPSE: ?WNL ?  ?TODAY'S TREATMENT 10/27/21: ?Manual: ?Soft tissue mobilization: ?Scar tissue mobilization: ?Myofascial release: ?Spinal mobilization: ?Internal pelvic floor techniques: ?Dry needling: ?Neuromuscular re-education: ?Core retraining:  ?Transversus abdominus training with multimodal  cues ?Core facilitation: ?Supine march 2 x 10 ?Leg extensions 2 x 10 ?Seated opposite resistance march 2 x 10 ?Bil UE extensions in seated, green band, 2 x 10 ?Seated recline rotations 2 x 10 ?Pallof press 2 x 10 bil, red band ?Form correction: ?Pelvic floor contraction training: ?Down training: ?Exercises: ?Stretches/mobility: ?Strengthening: ?Bridge with hip adduction 2 x 10 ?Clam shell 2 x 10 bil ?Therapeutic activities: ?Functional strengthening activities: ?Self-care: ? ? ? ?TREATMENT 10/20/21: ?Manual: ?Soft tissue mobilization: ?Scar tissue mobilization: ?Myofascial release: ?Spinal mobilization: ?Internal pelvic floor techniques: ?  Pelvic floor muscle release to superficial and deep pelvic floor muscles ?Dry needling: ?Neuromuscular re-education: ?Core retraining:  ?Core facilitation: ?Form correction: ?Pelvic floor contraction training: ?Down training: ?Exercises: ?Stretches/mobility: ?Kneeling hip flexor stretch 2 x 60 sec bil ?Frog stretch 2 x 60  ?Crescent lunge 2 min bil ?Crescent lunge wind mill 5x bil ?Pigeon 2 min bil ?Strengthening: ?Therapeutic activities: ?Functional strengthening activities: ?Self-care: ? ? ? ?TREATMENT 10/13/21: ?Manual: ?Soft tissue mobilization: ?Bil adductors ?Scar tissue mobilization: ?Myofascial release: ?Spinal mobilization: ?Internal pelvic floor techniques: ?Pelvic floor muscle release to superficial and deep pelvic floor muscles ?Dry needling: ?Neuromuscular re-education: ?Core retraining:  ?Core facilitation: ?Form correction: ?Pelvic floor contraction training: ?Down training: ?Diaphragmatic breathing ?Body scan relaxation ?Exercises: ?Stretches/mobility: ?Strengthening: ?Therapeutic activities: ?Functional strengthening activities: ?Self-care: ? ? ?  ?PATIENT EDUCATION:  ?Education details: HEP progressions ?Person educated: Patient ?Education method: Explanation, Demonstration, Tactile cues, Verbal cues, and Handouts ?Education comprehension: verbalized understanding ?   ?  ?HOME EXERCISE PROGRAM: ?[id number] ?  ?ASSESSMENT: ?  ?CLINICAL IMPRESSION: ?Patient continues to do very well with decrease in all symptoms and report of no lower abdominal pressure or pain. We started p

## 2021-11-01 ENCOUNTER — Ambulatory Visit (INDEPENDENT_AMBULATORY_CARE_PROVIDER_SITE_OTHER): Payer: BC Managed Care – PPO | Admitting: Psychology

## 2021-11-01 DIAGNOSIS — F331 Major depressive disorder, recurrent, moderate: Secondary | ICD-10-CM

## 2021-11-01 NOTE — Progress Notes (Signed)
[facility] Counselor/Therapist Progress Note ? ?Patient ID: Kendra Allen, MRN: [id number],   ? ?Date: 11/01/2021 ? ?Time Spent: 11:00am-11:45am    45 minutes  ? ?Treatment Type: Individual Therapy ? ?Reported Symptoms:  stress ? ?Mental Status Exam: ?Appearance:  Casual     ?Behavior: Appropriate  ?Motor: Normal  ?Speech/Language:  Normal Rate  ?Affect: Appropriate  ?Mood: normal  ?Thought process: normal  ?Thought content:   WNL  ?Sensory/Perceptual disturbances:   WNL  ?Orientation: oriented to person, place, time/date, and situation  ?Attention: Good  ?Concentration: Good  ?Memory: WNL  ?Fund of knowledge:  Good  ?Insight:   Good  ?Judgment:  Good  ?Impulse Control: Good  ? ?Risk Assessment: ?Danger to Self:  No ?Self-injurious Behavior: No ?Danger to Others: No ?Duty to Warn:no ?Physical Aggression / Violence:No  ?Access to Firearms a concern: No  ?Gang Involvement:No  ? ?Subjective: Pt present for face-to-face individual therapy via video Webex.  Pt consents to telehealth video session due to COVID 19 pandemic. ?Location of pt: home ?Location of therapist: home office.  ?Pt talked about having a busy week last week.  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  She states she enjoyed the concert and she has not been able to enjoy anything for a very long time.  ?Pt saw her psychiatrist and the Trintilix was increased to 20 mg.   ?Pt has been intentional about going out every day.   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   ?Pt talked about her mood.  She has felt stable and level.   She does not feel as down as she use to but does not feel "back to normal".  ?Worked on self care strategies.   Worked on thought reframing and positive affirmations.   ?Provided supportive therapy.  ? ?Interventions: Cognitive Behavioral Therapy and Insight-Oriented ? ?Diagnosis: F33.1 ? ?Plan:  Plan to continue to meet every two weeks.   ?Treatment Plan  (treatment plan  target date: 06/28/2022). ?Client Abilities/Strengths  ?Pt is bright and motivated for therapy. ?Client Treatment Preferences  ?Individual therapy.  ?Client Statement of Needs  ?Improve coping skills.  ?Symptoms  ?Depressed or irritable mood.  Diminished interest in or enjoyment of activities.  ?Problems Addressed  ?Unipolar Depression ?Goals ?1. Alleviate depressive symptoms and return to previous level of effective functioning. ?2. Appropriately grieve the loss in order to normalize mood and to return to previously adaptive level of functioning. ?Objective ?Learn and implement behavioral strategies to overcome depression. ?Target Date: 2022-06-28 Frequency: Biweekly  ?Progress: 10 Modality: individual  ?Related Interventions ?Engage the client in "behavioral activation," increasing his/her activity level and contact with sources of reward, while identifying processes that inhibit activation.  Use behavioral techniques such as instruction, rehearsal, role-playing, role reversal, as needed, to facilitate activity in the client's daily life; reinforce success. ?Assist the client in developing skills that increase the likelihood of deriving pleasure from behavioral activation (e.g., assertiveness skills, developing an exercise plan, less internal/more external focus, increased social involvement); reinforce success. ?Objective ?Identify important people in life, past and present, and describe the quality, good and poor, of those relationships. ?Target Date: 2022-06-28 Frequency: Biweekly  ?Progress: 10 Modality: individual  ?Related Interventions ?Conduct Interpersonal Therapy beginning with the assessment of the client's "interpersonal inventory" of important past and present relationships; develop a case formulation linking depression to grief, interpersonal role disputes, role transitions, and/or interpersonal deficits). ?Objective ?Learn and implement problem-solving and  decision-making skills. ?Target Date:  2022-06-28 Frequency: Biweekly  ?Progress: 10 Modality: individual  ?Related Interventions ?Conduct Problem-Solving Therapy using techniques such as psychoeducation, modeling, and role-playing to teach client problem-solving skills (i.e., defining a problem specifically, generating possible solutions, evaluating the pros and cons of each solution, selecting and implementing a plan of action, evaluating the efficacy of the plan, accepting or revising the plan); role-play application of the problem-solving skill to a real life issue. ?Encourage in the client the development of a positive problem orientation in which problems and solving them are viewed as a natural part of life and not something to be feared, despaired, or avoided. ?3. Develop healthy interpersonal relationships that lead to the alleviation and help prevent the relapse of depression. ?4. Develop healthy thinking patterns and beliefs about self, others, and the world that lead to the alleviation and help prevent the relapse of depression. ?5. Recognize, accept, and cope with feelings of depression. ?Diagnosis ?F33.1  ?Conditions For Discharge ?Achievement of treatment goals and objectives  ? ?Dacia Capers, LCSW ? ? ?

## 2021-11-03 ENCOUNTER — Ambulatory Visit: Payer: BC Managed Care – PPO

## 2021-11-03 DIAGNOSIS — M6281 Muscle weakness (generalized): Secondary | ICD-10-CM

## 2021-11-03 DIAGNOSIS — R252 Cramp and spasm: Secondary | ICD-10-CM

## 2021-11-03 DIAGNOSIS — R278 Other lack of coordination: Secondary | ICD-10-CM

## 2021-11-03 NOTE — Therapy (Signed)
OUTPATIENT PHYSICAL THERAPY TREATMENT NOTE   Patient Name: Kendra Allen MRN: [id number] DOB:08-11-1995, 26 y.o., female Today's Date: 11/03/2021  PCP: Cassandria Anger, MD REFERRING PROVIDER: Lonia Skinner  END OF SESSION:   PT End of Session - 11/03/21 0933     Visit Number 6    Date for PT Re-Evaluation 12/22/21    Authorization Type BCBS    PT Start Time 0932    PT Stop Time 1010    PT Time Calculation (min) 38 min    Activity Tolerance Patient tolerated treatment well    Behavior During Therapy WFL for tasks assessed/performed                Past Medical History:  Diagnosis Date   Anemia    Anxiety    Chest pain    Constipation    Depression    Endometriosis of pelvis    Family history of breast cancer    Family history of colon cancer    Family history of ovarian cancer    Family history of prostate cancer    Hidradenitis suppurativa    PCOS (polycystic ovarian syndrome)    Prediabetes    Sleep apnea    Vitamin D deficiency    Past Surgical History:  Procedure Laterality Date   CERVICAL POLYPECTOMY     DILATATION & CURETTAGE/HYSTEROSCOPY WITH MYOSURE N/A 11/21/2016   Procedure: DILATATION & CURETTAGE/HYSTEROSCOPY WITH MYOSURE;  [name], MD;  Location: [facility];  Service: Gynecology;  [name];   ROBOTIC ASSISTED LAPAROSCOPIC OVARIAN CYSTECTOMY N/A 12/30/2020   Procedure: XI ROBOTIC ASSISTED LAPAROSCOPIC RIGHT SALPINGO-OOPHORECTOMY WITH PELVIC WASHINGS;  [name], MD;  [name];  Service: Gynecology;  [name];   WISDOM TOOTH EXTRACTION     Patient Active Problem List   Diagnosis Date Noted   Ingrown toenail 07/25/2021   Endometriosis    Subserous leiomyoma of uterus    Anovulatory (dysfunctional uterine) bleeding 12/14/2020   Cyst of right ovary 12/14/2020   Cough 09/28/2020   Genetic testing 07/02/2020   Family history of breast cancer    Family history of colon  cancer    Family history of prostate cancer    Family history of ovarian cancer    Prediabetes    Well adult exam 04/12/2020   [place] 04/12/2020   PCOS (polycystic ovarian syndrome) 12/04/2019   Vitamin D deficiency 03/31/2019   Hyperglycemia 03/31/2019   Anemia 01/21/2018   Midsternal chest pain 11/26/2015   Pain in joint, ankle and foot 03/30/2015   Apathy 03/26/2014   Headache 03/26/2014   PMS (premenstrual syndrome) 01/26/2014   Anxiety and depression 01/26/2014   Fever and chills 09/10/2013   Fatigue 09/10/2013   Anxiety attack 06/09/2013   Unspecified constipation 04/02/2013   Diarrhea 04/02/2013   Allergic rhinitis 02/01/2012   Otitis media 12/12/2011   Obesity 03/30/2010   Hidradenitis 12/02/2009    REFERRING DIAG: V89.381 (ICD-10-CM) - Other muscle spasm N94.89 (ICD-10-CM) - Other specified conditions associated with female genital organs and menstrual cycle  THERAPY DIAG:  Muscle weakness (generalized)  Cramp and spasm  Other lack of coordination  PERTINENT HISTORY: Rt oophorectomy, endometriosis, ovarian cysts, PCOS  PRECAUTIONS: NA  SUBJECTIVE: Pt states that she has only had one incident of increase in pelvic pressure over the last week when she was driving and it lasted for about 5 minutes.   PAIN:  Are you having pain? No   SUBJECTIVE STATEMENT 09/29/2021: Pt  states that in July 2022 she had cyst on ovary removed and she has been complications. She feels like pelvic floor is very tight and leading to bladder issues. Pt was here for pelvic floor PT, but did not have any internal work done, but she feels more prepared for this at this time.  Fluid intake: Yes: 32oz a day     Patient confirms identification and approves PT to assess pelvic floor and treatment Yes      PLOF: Independent   PATIENT GOALS She wants to reduce pressure and tightness in pelvic floor   PERTINENT HISTORY:  Rt oophorectomy, endometriosis, ovarian cysts, depression,  PCOS Sexual abuse: No   BOWEL MOVEMENT Pain with bowel movement: No Type of bowel movement:Frequency 1-2x/day and Strain No Fully empty rectum: Yes: - Leakage: No Pads: No Fiber supplement: No   URINATION Pain with urination: Yes - pressure Fully empty bladder: Yes: - Stream: Strong Urgency: Yes: pressure and discomfort when she has to go to the bathroom Frequency: every 1-2 hours - increased since surgery; she wakes 1-2x/night to urinate Leakage:  none Pads: No   INTERCOURSE Pain with intercourse:  not currently sexually active, no pain in the past Ability to have vaginal penetration:  Yes: - Climax: not sure     PREGNANCY Vaginal deliveries 0   C-section deliveries 0 Currently pregnant No     OBJECTIVE:    DIAGNOSTIC FINDINGS:  Urodynamics testing   COGNITION:            Overall cognitive status: Within functional limits for tasks assessed                          SENSATION:            Light touch: Appears intact            Proprioception: Appears intact   POSTURE:  Rounded shoulders, forward head, decreased lower thoracic kyphosis, increased anteiror pelvic tilt   LUMBARAROM/PROM:  decreased 25% all motions     PELVIC MMT: 2/5, endurance 4 seconds, repetitions 3 (not able to relax in between)         PALPATION:   General  tenderness to palpation direction over bladder and in Left lower quadrant                 External Perineal Exam WNL                             Internal Pelvic Floor tenderness and pain throughout superficial and deep pelvic floor bil   TONE: High   PROLAPSE: WNL   TODAY'S TREATMENT 11/03/21 Manual: Soft tissue mobilization: Scar tissue mobilization: Myofascial release: Spinal mobilization: Internal pelvic floor techniques: Dry needling: Neuromuscular re-education: Core retraining:  Core facilitation: Bird-dog 2 x 10 Bear crawl holds 5 x 5 seconds Leg extensions 10x bil Standing bil UE extensions green band 2 x  10 Pallof press 10x bil green band Form correction: Pelvic floor contraction training: Down training: Exercises: Stretches/mobility: Cat/cow 2 x 10 Strengthening: Fire hydrant 2 x 10 bil 3 way kick 10x ea, bil Sidestepping 2 laps yellow band Kneeling chop 10x bil Therapeutic activities: Functional strengthening activities: Self-care:    TREATMENT 10/27/21: Manual: Soft tissue mobilization: Scar tissue mobilization: Myofascial release: Spinal mobilization: Internal pelvic floor techniques: Dry needling: Neuromuscular re-education: Core retraining:  Transversus abdominus training with multimodal cues Core facilitation: Supine march 2  x 10 Leg extensions 2 x 10 Seated opposite resistance march 2 x 10 Bil UE extensions in seated, green band, 2 x 10 Seated recline rotations 2 x 10 Pallof press 2 x 10 bil, red band Form correction: Pelvic floor contraction training: Down training: Exercises: Stretches/mobility: Strengthening: Bridge with hip adduction 2 x 10 Clam shell 2 x 10 bil Therapeutic activities: Functional strengthening activities: Self-care:    TREATMENT 10/20/21: Manual: Soft tissue mobilization: Scar tissue mobilization: Myofascial release: Spinal mobilization: Internal pelvic floor techniques: Pelvic floor muscle release to superficial and deep pelvic floor muscles Dry needling: Neuromuscular re-education: Core retraining:  Core facilitation: Form correction: Pelvic floor contraction training: Down training: Exercises: Stretches/mobility: Kneeling hip flexor stretch 2 x 60 sec bil Frog stretch 2 x 60  Crescent lunge 2 min bil Crescent lunge wind mill 5x bil Pigeon 2 min bil Strengthening: Therapeutic activities: Functional strengthening activities: Self-care:     PATIENT EDUCATION:  Education details: HEP progressions Person educated: Patient Education method: Consulting civil engineer, Media planner, Corporate treasurer cues, Verbal cues, and  Handouts Education comprehension: verbalized understanding     HOME EXERCISE PROGRAM: [id number]   ASSESSMENT:   CLINICAL IMPRESSION: Discussed importance of focusing on breath and pressure management with all exercises and stretches in order to prevent any increase in abdominal/pelvic floor tension that may cause pressure/discomfort. She did very well with all exercise progressions and review of previous progressions with good breath control/pressure management and no report of pain. If pain does not worsen over the next week, she is still on track for D/C at her next session. She will continue to benefit from skilled PT intervention in order to improve bladder function and decrease pelvic pain.      OBJECTIVE IMPAIRMENTS decreased activity tolerance, decreased coordination, decreased endurance, decreased mobility, decreased strength, hypomobility, increased fascial restrictions, increased muscle spasms, impaired tone, postural dysfunction, and pain.    ACTIVITY LIMITATIONS  daily activities .    PERSONAL FACTORS 3+ comorbidities: endometriosis, PCOS, oophorectomy  are also affecting patient's functional outcome.      REHAB POTENTIAL: Good   CLINICAL DECISION MAKING: Stable/uncomplicated   EVALUATION COMPLEXITY: Low     GOALS: Goals reviewed with patient? Yes   SHORT TERM GOALS: Target date: 10/27/2021   Pt will be independent with HEP.    Baseline: Goal status: INITIAL   2.  Pt will be able to correctly perform diaphragmatic breathing and appropriate pressure management in order to improve pelvic floor A/ROM and relaxation.    Baseline:  Goal status: INITIAL   3.  Pt will be able to teach back and utilize urge suppression technique and double-voiding in order to help reduce number of trips to the bathroom.     Baseline:  Goal status: INITIAL       LONG TERM GOALS: Target date: 12/22/2021   Pt will be independent with advanced HEP.    Baseline:  Goal status:  INITIAL   2.  Pt will demonstrate normal pelvic floor muscle tone and A/ROM, able to achieve 4/5 strength with contractions and 10 sec endurance, in order to provide appropriate lumbopelvic support in functional activities.    Baseline:  Goal status: INITIAL   3.  Pt will be able to go 2-3 hours in between voids without urgency or incontinence in order to improve QOL and perform all functional activities with less difficulty.    Baseline:  Goal status: INITIAL   4.  Pt will report decreased pelvic pain/pressure when having to void.  Baseline:  Goal status: INITIAL   PLAN: PT FREQUENCY: 1x/week   PT DURATION: 12 weeks   PLANNED INTERVENTIONS: Therapeutic exercises, Therapeutic activity, Neuromuscular re-education, Balance training, Gait training, Patient/Family education, Joint mobilization, Dry Needling, Biofeedback, and Manual therapy   PLAN FOR NEXT SESSION: Possible D/C next session.     Heather Roberts, PT, DPT05/18/2310:10 AM

## 2021-11-08 ENCOUNTER — Ambulatory Visit: Payer: BC Managed Care – PPO

## 2021-11-08 DIAGNOSIS — M6281 Muscle weakness (generalized): Secondary | ICD-10-CM

## 2021-11-08 DIAGNOSIS — R278 Other lack of coordination: Secondary | ICD-10-CM

## 2021-11-08 DIAGNOSIS — R252 Cramp and spasm: Secondary | ICD-10-CM

## 2021-11-08 NOTE — Therapy (Signed)
OUTPATIENT PHYSICAL THERAPY TREATMENT NOTE   Patient Name: Kendra Allen MRN: [id number] DOB:February 06, 1996, 26 y.o., female Today's Date: 11/08/2021  PCP: Cassandria Anger, MD REFERRING PROVIDER: Cassandria Anger, MD  END OF SESSION:   PT End of Session - 11/08/21 0933     Visit Number 7    Date for PT Re-Evaluation 12/22/21    Authorization Type [employer]    PT Start Time 0932    PT Stop Time 1010    PT Time Calculation (min) 38 min    Activity Tolerance Patient tolerated treatment well    Behavior During Therapy WFL for tasks assessed/performed                 Past Medical History:  Diagnosis Date   Anemia    Anxiety    Chest pain    Constipation    Depression    Endometriosis of pelvis    Family history of breast cancer    Family history of colon cancer    Family history of ovarian cancer    Family history of prostate cancer    Hidradenitis suppurativa    PCOS (polycystic ovarian syndrome)    Prediabetes    Sleep apnea    Vitamin D deficiency    Past Surgical History:  Procedure Laterality Date   CERVICAL POLYPECTOMY     DILATATION & CURETTAGE/HYSTEROSCOPY WITH MYOSURE N/A 11/21/2016   Procedure: DILATATION & CURETTAGE/HYSTEROSCOPY WITH MYOSURE;  [name], MD;  Location: [facility];  Service: Gynecology;  [name];   ROBOTIC ASSISTED LAPAROSCOPIC OVARIAN CYSTECTOMY N/A 12/30/2020   Procedure: XI ROBOTIC ASSISTED LAPAROSCOPIC RIGHT SALPINGO-OOPHORECTOMY WITH PELVIC WASHINGS;  [name], MD;  [name];  Service: Gynecology;  [name];   WISDOM TOOTH EXTRACTION     Patient Active Problem List   Diagnosis Date Noted   Ingrown toenail 07/25/2021   Endometriosis    Subserous leiomyoma of uterus    Anovulatory (dysfunctional uterine) bleeding 12/14/2020   Cyst of right ovary 12/14/2020   Cough 09/28/2020   Genetic testing 07/02/2020   Family history of breast cancer    Family history of colon  cancer    Family history of prostate cancer    Family history of ovarian cancer    Prediabetes    Well adult exam 04/12/2020   Snorings 04/12/2020   PCOS (polycystic ovarian syndrome) 12/04/2019   Vitamin D deficiency 03/31/2019   Hyperglycemia 03/31/2019   Anemia 01/21/2018   Midsternal chest pain 11/26/2015   Pain in joint, ankle and foot 03/30/2015   Apathy 03/26/2014   Headache 03/26/2014   PMS (premenstrual syndrome) 01/26/2014   Anxiety and depression 01/26/2014   Fever and chills 09/10/2013   Fatigue 09/10/2013   Anxiety attack 06/09/2013   Unspecified constipation 04/02/2013   Diarrhea 04/02/2013   Allergic rhinitis 02/01/2012   Otitis media 12/12/2011   Obesity 03/30/2010   Hidradenitis 12/02/2009    REFERRING DIAG: Q73.419 (ICD-10-CM) - Other muscle spasm N94.89 (ICD-10-CM) - Other specified conditions associated with female genital organs and menstrual cycle  THERAPY DIAG:  Muscle weakness (generalized)  Cramp and spasm  Other lack of coordination  PERTINENT HISTORY: Rt oophorectomy, endometriosis, ovarian cysts, PCOS  PRECAUTIONS: NA  SUBJECTIVE: Pt states that she has had a little bit more pelvic pressure - an hour yesterday and then some this morning after she woke up. Yesterday it happened during water aerobics. She has been working on exercises more regularly. She feels like  she has been working on stretches regularly. She has not been working on breathing/mindfulness as much. She feels like the pain is located more just in lower pelvis vs up in her abdomen.   PAIN:  Are you having pain? No   SUBJECTIVE STATEMENT 09/29/2021: Pt states that in July 2022 she had cyst on ovary removed and she has been complications. She feels like pelvic floor is very tight and leading to bladder issues. Pt was here for pelvic floor PT, but did not have any internal work done, but she feels more prepared for this at this time.  Fluid intake: Yes: 32oz a day     Patient  confirms identification and approves PT to assess pelvic floor and treatment Yes      PLOF: Independent   PATIENT GOALS She wants to reduce pressure and tightness in pelvic floor   PERTINENT HISTORY:  Rt oophorectomy, endometriosis, ovarian cysts, depression, PCOS Sexual abuse: No   BOWEL MOVEMENT Pain with bowel movement: No Type of bowel movement:Frequency 1-2x/day and Strain No Fully empty rectum: Yes: - Leakage: No Pads: No Fiber supplement: No   URINATION Pain with urination: Yes - pressure Fully empty bladder: Yes: - Stream: Strong Urgency: Yes: pressure and discomfort when she has to go to the bathroom Frequency: every 1-2 hours - increased since surgery; she wakes 1-2x/night to urinate Leakage:  none Pads: No   INTERCOURSE Pain with intercourse:  not currently sexually active, no pain in the past Ability to have vaginal penetration:  Yes: - Climax: not sure     PREGNANCY Vaginal deliveries 0   C-section deliveries 0 Currently pregnant No     OBJECTIVE 11/08/21: Increase in superficial/deep pelvic floor tension with tenderness/pinching reported by patient.   09/29/21:  DIAGNOSTIC FINDINGS:  Urodynamics testing   COGNITION:            Overall cognitive status: Within functional limits for tasks assessed                          SENSATION:            Light touch: Appears intact            Proprioception: Appears intact   POSTURE:  Rounded shoulders, forward head, decreased lower thoracic kyphosis, increased anteiror pelvic tilt   LUMBARAROM/PROM:  decreased 25% all motions     PELVIC MMT: 2/5, endurance 4 seconds, repetitions 3 (not able to relax in between)         PALPATION:   General  tenderness to palpation direction over bladder and in Left lower quadrant                 External Perineal Exam WNL                             Internal Pelvic Floor tenderness and pain throughout superficial and deep pelvic floor bil   TONE: High    PROLAPSE: WNL   TODAY'S TREATMENT 11/08/21: Manual: Scar tissue mobilization: Abdominal scar tissue Myofascial release: Abdominal/uterine release Internal pelvic floor techniques: No emotional/communication barriers or cognitive limitation. Patient is motivated to learn. Patient understands and agrees with treatment goals and plan. PT explains patient will be examined in standing, sitting, and lying down to see how their muscles and joints work. When they are ready, they will be asked to remove their underwear so PT can examine their perineum.  The patient is also given the option of providing their own chaperone as one is not provided in our facility. The patient also has the right and is explained the right to defer or refuse any part of the evaluation or treatment including the internal exam. With the patient's consent, PT will use one gloved finger to gently assess the muscles of the pelvic floor, seeing how well it contracts and relaxes and if there is muscle symmetry. After, the patient will get dressed and PT and patient will discuss exam findings and plan of care. PT and patient discuss plan of care, schedule, attendance policy and HEP activities. Superficial and deep Bil pelvic floor release Self-care: Pelvic floor wand    TREATMENT 11/03/21 Manual: Soft tissue mobilization: Scar tissue mobilization: Myofascial release: Spinal mobilization: Internal pelvic floor techniques: Dry needling: Neuromuscular re-education: Core retraining:  Core facilitation: Bird-dog 2 x 10 Bear crawl holds 5 x 5 seconds Leg extensions 10x bil Standing bil UE extensions green band 2 x 10 Pallof press 10x bil green band Form correction: Pelvic floor contraction training: Down training: Exercises: Stretches/mobility: Cat/cow 2 x 10 Strengthening: Fire hydrant 2 x 10 bil 3 way kick 10x ea, bil Sidestepping 2 laps yellow band Kneeling chop 10x bil Therapeutic activities: Functional  strengthening activities: Self-care:    TREATMENT 10/27/21: Manual: Soft tissue mobilization: Scar tissue mobilization: Myofascial release: Spinal mobilization: Internal pelvic floor techniques: Dry needling: Neuromuscular re-education: Core retraining:  Transversus abdominus training with multimodal cues Core facilitation: Supine march 2 x 10 Leg extensions 2 x 10 Seated opposite resistance march 2 x 10 Bil UE extensions in seated, green band, 2 x 10 Seated recline rotations 2 x 10 Pallof press 2 x 10 bil, red band Form correction: Pelvic floor contraction training: Down training: Exercises: Stretches/mobility: Strengthening: Bridge with hip adduction 2 x 10 Clam shell 2 x 10 bil Therapeutic activities: Functional strengthening activities: Self-care:     PATIENT EDUCATION:  Education details: HEP progressions Person educated: Patient Education method: Consulting civil engineer, Demonstration, Tactile cues, Verbal cues, and Handouts Education comprehension: verbalized understanding     HOME EXERCISE PROGRAM: [id number]   ASSESSMENT:   CLINICAL IMPRESSION: Due to continued increase in pain, although less intense and frequent than before, we decided to keep her next 3 scheduled appointments for the time being. We returned to internal pelvic floor exam with muscle release and abdominal manual techniques this session to see if we could address soft tissue restriction. Believe not focusing on breathing and mindfulness may also be playing a role in increased tension. She did demonstrate significant tension and tenderness throughout pelvic floor superficial/deep muscles that she reported felt pinchy. We discussed benefit of pelvic floor muscle wand moving forward to help manage any increases in pelvic floor tension. She will continue to benefit from skilled PT intervention in order to improve bladder function and decrease pelvic pain.      OBJECTIVE IMPAIRMENTS decreased activity  tolerance, decreased coordination, decreased endurance, decreased mobility, decreased strength, hypomobility, increased fascial restrictions, increased muscle spasms, impaired tone, postural dysfunction, and pain.    ACTIVITY LIMITATIONS  daily activities .    PERSONAL FACTORS 3+ comorbidities: endometriosis, PCOS, oophorectomy  are also affecting patient's functional outcome.      REHAB POTENTIAL: Good   CLINICAL DECISION MAKING: Stable/uncomplicated   EVALUATION COMPLEXITY: Low     GOALS: Goals reviewed with patient? Yes   SHORT TERM GOALS: Target date: 10/27/2021   Pt will be independent with HEP.    Baseline:  Goal status: INITIAL   2.  Pt will be able to correctly perform diaphragmatic breathing and appropriate pressure management in order to improve pelvic floor A/ROM and relaxation.    Baseline:  Goal status: INITIAL   3.  Pt will be able to teach back and utilize urge suppression technique and double-voiding in order to help reduce number of trips to the bathroom.     Baseline:  Goal status: INITIAL       LONG TERM GOALS: Target date: 12/22/2021   Pt will be independent with advanced HEP.    Baseline:  Goal status: INITIAL   2.  Pt will demonstrate normal pelvic floor muscle tone and A/ROM, able to achieve 4/5 strength with contractions and 10 sec endurance, in order to provide appropriate lumbopelvic support in functional activities.    Baseline:  Goal status: INITIAL   3.  Pt will be able to go 2-3 hours in between voids without urgency or incontinence in order to improve QOL and perform all functional activities with less difficulty.    Baseline:  Goal status: INITIAL   4.  Pt will report decreased pelvic pain/pressure when having to void.  Baseline:  Goal status: INITIAL   PLAN: PT FREQUENCY: 1x/week   PT DURATION: 12 weeks   PLANNED INTERVENTIONS: Therapeutic exercises, Therapeutic activity, Neuromuscular re-education, Balance training, Gait  training, Patient/Family education, Joint mobilization, Dry Needling, Biofeedback, and Manual therapy   PLAN FOR NEXT SESSION: Continue to assess pelvic floor/abdominal restriction. Go over pelvic floor muscle wand if patient has purchased.     Heather Roberts, PT, DPT05/23/2310:15 AM

## 2021-11-15 ENCOUNTER — Ambulatory Visit (INDEPENDENT_AMBULATORY_CARE_PROVIDER_SITE_OTHER): Payer: BC Managed Care – PPO | Admitting: Psychology

## 2021-11-15 DIAGNOSIS — F331 Major depressive disorder, recurrent, moderate: Secondary | ICD-10-CM

## 2021-11-15 NOTE — Progress Notes (Signed)
[facility] Counselor/Therapist Progress Note  Patient ID: Kendra Allen, MRN: [id number],    Date: 11/15/2021  Time Spent: 12:00pm-12:45pm    45 minutes   Treatment Type: Individual Therapy  Reported Symptoms:  stress  Mental Status Exam: Appearance:  Casual     Behavior: Appropriate  Motor: Normal  Speech/Language:  Normal Rate  Affect: Appropriate  Mood: normal  Thought process: normal  Thought content:   WNL  Sensory/Perceptual disturbances:   WNL  Orientation: oriented to person, place, time/date, and situation  Attention: Good  Concentration: Good  Memory: WNL  Fund of knowledge:  Good  Insight:   Good  Judgment:  Good  Impulse Control: Good   Risk Assessment: Danger to Self:  No Self-injurious Behavior: No Danger to Others: No Duty to Warn:no Physical Aggression / Violence:No  Access to Firearms a concern: No  Gang Involvement:No   Subjective: Pt present for face-to-face individual therapy via video Webex.  Pt consents to telehealth video session due to COVID 19 pandemic. Location of pt: home Location of therapist: home office.  Pt talked about continuing to get out every day.  She is being intentional about staying busy.   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   She states it is a little scary bc it is something new.   Addressed pt's worries.   Worked on worry management and thought reframing.   Pt talked about her mood.  She hasn't felt as down as she use to feel.   She feels like she is making progress.   Worked on self care strategies.   Worked on positive affirmations.   Provided supportive therapy.   Interventions: Cognitive Behavioral Therapy and Insight-Oriented  Diagnosis: F33.1  Plan:  Plan to continue to meet every two weeks.   Treatment Plan  (treatment plan target date: 06/28/2022). Client Abilities/Strengths  Pt is  bright and motivated for therapy. Client Treatment Preferences  Individual therapy.  Client Statement of Needs  Improve coping skills.  Symptoms  Depressed or irritable mood.  Diminished interest in or enjoyment of activities.  Problems Addressed  Unipolar Depression Goals 1. Alleviate depressive symptoms and return to previous level of effective functioning. 2. Appropriately grieve the loss in order to normalize mood and to return to previously adaptive level of functioning. Objective Learn and implement behavioral strategies to overcome depression. Target Date: 2022-06-28 Frequency: Biweekly  Progress: 10 Modality: individual  Related Interventions Engage the client in "behavioral activation," increasing his/her activity level and contact with sources of reward, while identifying processes that inhibit activation.  Use behavioral techniques such as instruction, rehearsal, role-playing, role reversal, as needed, to facilitate activity in the client's daily life; reinforce success. Assist the client in developing skills that increase the likelihood of deriving pleasure from behavioral activation (e.g., assertiveness skills, developing an exercise plan, less internal/more external focus, increased social involvement); reinforce success. Objective Identify important people in life, past and present, and describe the quality, good and poor, of those relationships. Target Date: 2022-06-28 Frequency: Biweekly  Progress: 10 Modality: individual  Related Interventions Conduct Interpersonal Therapy beginning with the assessment of the client's "interpersonal inventory" of important past and present relationships; develop a case formulation linking depression to grief, interpersonal role disputes, role transitions, and/or interpersonal deficits). Objective Learn and implement problem-solving and decision-making skills. Target Date: 2022-06-28 Frequency: Biweekly  Progress: 10 Modality:  individual   Related Interventions Conduct Problem-Solving Therapy using techniques such as psychoeducation, modeling, and role-playing to teach client problem-solving skills (i.e., defining a problem specifically, generating possible solutions, evaluating the pros and cons of each solution, selecting and implementing a plan of action, evaluating the efficacy of the plan, accepting or revising the plan); role-play application of the problem-solving skill to a real life issue. Encourage in the client the development of a positive problem orientation in which problems and solving them are viewed as a natural part of life and not something to be feared, despaired, or avoided. 3. Develop healthy interpersonal relationships that lead to the alleviation and help prevent the relapse of depression. 4. Develop healthy thinking patterns and beliefs about self, others, and the world that lead to the alleviation and help prevent the relapse of depression. 5. Recognize, accept, and cope with feelings of depression. Diagnosis F33.1  Conditions For Discharge Achievement of treatment goals and objectives   Clint Bolder, LCSW

## 2021-11-17 ENCOUNTER — Ambulatory Visit: Payer: BC Managed Care – PPO | Attending: Obstetrics and Gynecology

## 2021-11-17 DIAGNOSIS — M6281 Muscle weakness (generalized): Secondary | ICD-10-CM | POA: Diagnosis present

## 2021-11-17 DIAGNOSIS — R278 Other lack of coordination: Secondary | ICD-10-CM | POA: Diagnosis present

## 2021-11-17 DIAGNOSIS — R252 Cramp and spasm: Secondary | ICD-10-CM | POA: Insufficient documentation

## 2021-11-17 NOTE — Therapy (Signed)
OUTPATIENT PHYSICAL THERAPY TREATMENT NOTE   Patient Name: Kendra Allen MRN: [id number] DOB:10/20/1995, 26 y.o., female Today's Date: 11/17/2021  PCP: Cassandria Anger, MD REFERRING PROVIDER: Lonia Skinner  END OF SESSION:   PT End of Session - 11/17/21 1617     Visit Number 8    Date for PT Re-Evaluation 12/22/21    Authorization Type BCBS    PT Start Time 1616    PT Stop Time 1655    PT Time Calculation (min) 39 min    Activity Tolerance Patient tolerated treatment well                  Past Medical History:  Diagnosis Date   Anemia    Anxiety    Chest pain    Constipation    Depression    Endometriosis of pelvis    Family history of breast cancer    Family history of colon cancer    Family history of ovarian cancer    Family history of prostate cancer    Hidradenitis suppurativa    PCOS (polycystic ovarian syndrome)    Prediabetes    Sleep apnea    Vitamin D deficiency    Past Surgical History:  Procedure Laterality Date   CERVICAL POLYPECTOMY     DILATATION & CURETTAGE/HYSTEROSCOPY WITH MYOSURE N/A 11/21/2016   Procedure: DILATATION & CURETTAGE/HYSTEROSCOPY WITH MYOSURE;  [name], MD;  Location: [facility];  Service: Gynecology;  [name];   ROBOTIC ASSISTED LAPAROSCOPIC OVARIAN CYSTECTOMY N/A 12/30/2020   Procedure: XI ROBOTIC ASSISTED LAPAROSCOPIC RIGHT SALPINGO-OOPHORECTOMY WITH PELVIC WASHINGS;  [name], MD;  [name];  Service: Gynecology;  [name];   WISDOM TOOTH EXTRACTION     Patient Active Problem List   Diagnosis Date Noted   Ingrown toenail 07/25/2021   Endometriosis    Subserous leiomyoma of uterus    Anovulatory (dysfunctional uterine) bleeding 12/14/2020   Cyst of right ovary 12/14/2020   Cough 09/28/2020   Genetic testing 07/02/2020   Family history of breast cancer    Family history of colon cancer    Family history of prostate cancer    Family  history of ovarian cancer    Prediabetes    Well adult exam 04/12/2020   Snorings 04/12/2020   PCOS (polycystic ovarian syndrome) 12/04/2019   Vitamin D deficiency 03/31/2019   Hyperglycemia 03/31/2019   Anemia 01/21/2018   Midsternal chest pain 11/26/2015   Pain in joint, ankle and foot 03/30/2015   Apathy 03/26/2014   Headache 03/26/2014   PMS (premenstrual syndrome) 01/26/2014   Anxiety and depression 01/26/2014   Fever and chills 09/10/2013   Fatigue 09/10/2013   Anxiety attack 06/09/2013   Unspecified constipation 04/02/2013   Diarrhea 04/02/2013   Allergic rhinitis 02/01/2012   Otitis media 12/12/2011   Obesity 03/30/2010   Hidradenitis 12/02/2009    REFERRING DIAG: T51.761 (ICD-10-CM) - Other muscle spasm N94.89 (ICD-10-CM) - Other specified conditions associated with female genital organs and menstrual cycle  THERAPY DIAG:  Muscle weakness (generalized)  Cramp and spasm  Other lack of coordination  PERTINENT HISTORY: Rt oophorectomy, endometriosis, ovarian cysts, PCOS  PRECAUTIONS: NA  SUBJECTIVE: Patient states that she has not had any pain in the last week. She feels like the stretches are really helpful if she feels like discomfort is about to happen. She has returned to working on some strengthening. She is on the fence about getting the wand because she is unsure if  she will use it or not.   PAIN:  Are you having pain? No   SUBJECTIVE STATEMENT 09/29/2021: Pt states that in July 2022 she had cyst on ovary removed and she has been complications. She feels like pelvic floor is very tight and leading to bladder issues. Pt was here for pelvic floor PT, but did not have any internal work done, but she feels more prepared for this at this time.  Fluid intake: Yes: 32oz a day     Patient confirms identification and approves PT to assess pelvic floor and treatment Yes      PLOF: Independent   PATIENT GOALS She wants to reduce pressure and tightness in pelvic  floor   PERTINENT HISTORY:  Rt oophorectomy, endometriosis, ovarian cysts, depression, PCOS Sexual abuse: No   BOWEL MOVEMENT Pain with bowel movement: No Type of bowel movement:Frequency 1-2x/day and Strain No Fully empty rectum: Yes: - Leakage: No Pads: No Fiber supplement: No   URINATION Pain with urination: Yes - pressure Fully empty bladder: Yes: - Stream: Strong Urgency: Yes: pressure and discomfort when she has to go to the bathroom Frequency: every 1-2 hours - increased since surgery; she wakes 1-2x/night to urinate Leakage:  none Pads: No   INTERCOURSE Pain with intercourse:  not currently sexually active, no pain in the past Ability to have vaginal penetration:  Yes: - Climax: not sure     PREGNANCY Vaginal deliveries 0   C-section deliveries 0 Currently pregnant No     OBJECTIVE 11/08/21: Increase in superficial/deep pelvic floor tension with tenderness/pinching reported by patient.   09/29/21:  DIAGNOSTIC FINDINGS:  Urodynamics testing   COGNITION:            Overall cognitive status: Within functional limits for tasks assessed                          SENSATION:            Light touch: Appears intact            Proprioception: Appears intact   POSTURE:  Rounded shoulders, forward head, decreased lower thoracic kyphosis, increased anteiror pelvic tilt   LUMBARAROM/PROM:  decreased 25% all motions     PELVIC MMT: 2/5, endurance 4 seconds, repetitions 3 (not able to relax in between)         PALPATION:   General  tenderness to palpation direction over bladder and in Left lower quadrant                 External Perineal Exam WNL                             Internal Pelvic Floor tenderness and pain throughout superficial and deep pelvic floor bil   TONE: High   PROLAPSE: WNL   TODAY'S TREATMENT 11/17/21 Manual: Scar tissue mobilization: Abdominal scar tissue Myofascial release: Abdominal/uterine release Internal pelvic floor  techniques: No emotional/communication barriers or cognitive limitation. Patient is motivated to learn. Patient understands and agrees with treatment goals and plan. PT explains patient will be examined in standing, sitting, and lying down to see how their muscles and joints work. When they are ready, they will be asked to remove their underwear so PT can examine their perineum. The patient is also given the option of providing their own chaperone as one is not provided in our facility. The patient also has the right and  is explained the right to defer or refuse any part of the evaluation or treatment including the internal exam. With the patient's consent, PT will use one gloved finger to gently assess the muscles of the pelvic floor, seeing how well it contracts and relaxes and if there is muscle symmetry. After, the patient will get dressed and PT and patient will discuss exam findings and plan of care. PT and patient discuss plan of care, schedule, attendance policy and HEP activities. Neuromuscular re-education: Core facilitation: Single leg row with rotation 2 x 10 green band Pallof rotation 10x bil green band    TREATMENT 11/08/21: Manual: Scar tissue mobilization: Abdominal scar tissue Myofascial release: Abdominal/uterine release Internal pelvic floor techniques: No emotional/communication barriers or cognitive limitation. Patient is motivated to learn. Patient understands and agrees with treatment goals and plan. PT explains patient will be examined in standing, sitting, and lying down to see how their muscles and joints work. When they are ready, they will be asked to remove their underwear so PT can examine their perineum. The patient is also given the option of providing their own chaperone as one is not provided in our facility. The patient also has the right and is explained the right to defer or refuse any part of the evaluation or treatment including the internal exam. With the  patient's consent, PT will use one gloved finger to gently assess the muscles of the pelvic floor, seeing how well it contracts and relaxes and if there is muscle symmetry. After, the patient will get dressed and PT and patient will discuss exam findings and plan of care. PT and patient discuss plan of care, schedule, attendance policy and HEP activities. Superficial and deep Bil pelvic floor release Self-care: Pelvic floor wand    TREATMENT 11/03/21 Manual: Soft tissue mobilization: Scar tissue mobilization: Myofascial release: Spinal mobilization: Internal pelvic floor techniques: Dry needling: Neuromuscular re-education: Core retraining:  Core facilitation: Bird-dog 2 x 10 Bear crawl holds 5 x 5 seconds Leg extensions 10x bil Standing bil UE extensions green band 2 x 10 Pallof press 10x bil green band Form correction: Pelvic floor contraction training: Down training: Exercises: Stretches/mobility: Cat/cow 2 x 10 Strengthening: Fire hydrant 2 x 10 bil 3 way kick 10x ea, bil Sidestepping 2 laps yellow band Kneeling chop 10x bil Therapeutic activities: Functional strengthening activities: Self-care:     PATIENT EDUCATION:  Education details: HEP progressions Person educated: Patient Education method: Explanation, Demonstration, Tactile cues, Verbal cues, and Handouts Education comprehension: verbalized understanding     HOME EXERCISE PROGRAM: [email or web address]   ASSESSMENT:   CLINICAL IMPRESSION: Patient saw good progress after last treatment session returning to pelvic floor/abdominal release, therefore continued today. She states that she has not gotten pelvic floor muscle wand due to not being sure she will use it. However, pelvic floor tension appears to increase when she is not regularly releasing muscles, possibly due to underlying endometriosis. Today pelvic floor release demonstrated more superficial tension and discomfort. She was encouraged to continue working on  down training stretches in addition to strengthening exercises. Good tolerance to exercise progressions for core facilitation with rotation; we discussed increased awareness of pelvic floor/core clenching when she is not performing exercises. She will continue to benefit from skilled PT intervention in order to improve bladder function and decrease pelvic pain.      OBJECTIVE IMPAIRMENTS decreased activity tolerance, decreased coordination, decreased endurance, decreased mobility, decreased strength, hypomobility, increased fascial restrictions, increased muscle spasms, impaired tone, postural dysfunction, and pain.  ACTIVITY LIMITATIONS  daily activities .    PERSONAL FACTORS 3+ comorbidities: endometriosis, PCOS, oophorectomy  are also affecting patient's functional outcome.      REHAB POTENTIAL: Good   CLINICAL DECISION MAKING: Stable/uncomplicated   EVALUATION COMPLEXITY: Low     GOALS: Goals reviewed with patient? Yes   SHORT TERM GOALS: Target date: 10/27/2021   Pt will be independent with HEP.    Baseline: Goal status: INITIAL   2.  Pt will be able to correctly perform diaphragmatic breathing and appropriate pressure management in order to improve pelvic floor A/ROM and relaxation.    Baseline:  Goal status: INITIAL   3.  Pt will be able to teach back and utilize urge suppression technique and double-voiding in order to help reduce number of trips to the bathroom.     Baseline:  Goal status: INITIAL       LONG TERM GOALS: Target date: 12/22/2021   Pt will be independent with advanced HEP.    Baseline:  Goal status: INITIAL   2.  Pt will demonstrate normal pelvic floor muscle tone and A/ROM, able to achieve 4/5 strength with contractions and 10 sec endurance, in order to provide appropriate lumbopelvic support in functional activities.    Baseline:  Goal status: INITIAL   3.  Pt will be able to go 2-3 hours in between voids without urgency or incontinence in  order to improve QOL and perform all functional activities with less difficulty.    Baseline:  Goal status: INITIAL   4.  Pt will report decreased pelvic pain/pressure when having to void.  Baseline:  Goal status: INITIAL   PLAN: PT FREQUENCY: 1x/week   PT DURATION: 12 weeks   PLANNED INTERVENTIONS: Therapeutic exercises, Therapeutic activity, Neuromuscular re-education, Balance training, Gait training, Patient/Family education, Joint mobilization, Dry Needling, Biofeedback, and Manual therapy   PLAN FOR NEXT SESSION: Continue to assess pelvic floor/abdominal restriction. Progress exercises to tolerance.     Heather Roberts, PT, DPT06/01/234:59 PM

## 2021-11-18 ENCOUNTER — Ambulatory Visit (INDEPENDENT_AMBULATORY_CARE_PROVIDER_SITE_OTHER): Payer: BC Managed Care – PPO | Admitting: *Deleted

## 2021-11-18 DIAGNOSIS — Z23 Encounter for immunization: Secondary | ICD-10-CM | POA: Diagnosis not present

## 2021-11-24 ENCOUNTER — Ambulatory Visit: Payer: BC Managed Care – PPO

## 2021-11-24 DIAGNOSIS — M6281 Muscle weakness (generalized): Secondary | ICD-10-CM | POA: Diagnosis not present

## 2021-11-24 DIAGNOSIS — R252 Cramp and spasm: Secondary | ICD-10-CM

## 2021-11-24 DIAGNOSIS — R278 Other lack of coordination: Secondary | ICD-10-CM

## 2021-11-24 NOTE — Therapy (Signed)
OUTPATIENT PHYSICAL THERAPY TREATMENT NOTE   Patient Name: Kendra Allen MRN: [id number] DOB:08/04/1995, 26 y.o., female Today's Date: 11/24/2021  PCP: Cassandria Anger, MD REFERRING PROVIDER: Cassandria Anger, MD  END OF SESSION:   PT End of Session - 11/24/21 1622     Visit Number 9    Date for PT Re-Evaluation 12/22/21    Authorization Type BCBS    PT Start Time 1617    PT Stop Time 1655    PT Time Calculation (min) 38 min    Activity Tolerance Patient tolerated treatment well    Behavior During Therapy WFL for tasks assessed/performed                   Past Medical History:  Diagnosis Date   Anemia    Anxiety    Chest pain    Constipation    Depression    Endometriosis of pelvis    Family history of breast cancer    Family history of colon cancer    Family history of ovarian cancer    Family history of prostate cancer    Hidradenitis suppurativa    PCOS (polycystic ovarian syndrome)    Prediabetes    Sleep apnea    Vitamin D deficiency    Past Surgical History:  Procedure Laterality Date   CERVICAL POLYPECTOMY     DILATATION & CURETTAGE/HYSTEROSCOPY WITH MYOSURE N/A 11/21/2016   Procedure: DILATATION & CURETTAGE/HYSTEROSCOPY WITH MYOSURE;  [name], MD;  Location: [facility];  Service: Gynecology;  [name];   ROBOTIC ASSISTED LAPAROSCOPIC OVARIAN CYSTECTOMY N/A 12/30/2020   Procedure: XI ROBOTIC ASSISTED LAPAROSCOPIC RIGHT SALPINGO-OOPHORECTOMY WITH PELVIC WASHINGS;  [name], MD;  [name];  Service: Gynecology;  [name];   WISDOM TOOTH EXTRACTION     Patient Active Problem List   Diagnosis Date Noted   Ingrown toenail 07/25/2021   Endometriosis    Subserous leiomyoma of uterus    Anovulatory (dysfunctional uterine) bleeding 12/14/2020   Cyst of right ovary 12/14/2020   Cough 09/28/2020   Genetic testing 07/02/2020   Family history of breast cancer    Family history of colon  cancer    Family history of prostate cancer    Family history of ovarian cancer    Prediabetes    Well adult exam 04/12/2020   Snorings 04/12/2020   PCOS (polycystic ovarian syndrome) 12/04/2019   Vitamin D deficiency 03/31/2019   Hyperglycemia 03/31/2019   Anemia 01/21/2018   Midsternal chest pain 11/26/2015   Pain in joint, ankle and foot 03/30/2015   Apathy 03/26/2014   Headache 03/26/2014   PMS (premenstrual syndrome) 01/26/2014   Anxiety and depression 01/26/2014   Fever and chills 09/10/2013   Fatigue 09/10/2013   Anxiety attack 06/09/2013   Unspecified constipation 04/02/2013   Diarrhea 04/02/2013   Allergic rhinitis 02/01/2012   Otitis media 12/12/2011   Obesity 03/30/2010   Hidradenitis 12/02/2009    REFERRING DIAG: K74.259 (ICD-10-CM) - Other muscle spasm N94.89 (ICD-10-CM) - Other specified conditions associated with female genital organs and menstrual cycle  THERAPY DIAG:  Muscle weakness (generalized)  Cramp and spasm  Other lack of coordination  PERTINENT HISTORY: Rt oophorectomy, endometriosis, ovarian cysts, PCOS  PRECAUTIONS: NA  SUBJECTIVE: Patient is making great progress with no pelvic/abdominal pain over the last week. She has been working on the stretches that she knows give her the most relief, but has not been focusing on strengthening due to being busy with  school.   PAIN:  Are you having pain? No   SUBJECTIVE STATEMENT 09/29/2021: Pt states that in July 2022 she had cyst on ovary removed and she has been complications. She feels like pelvic floor is very tight and leading to bladder issues. Pt was here for pelvic floor PT, but did not have any internal work done, but she feels more prepared for this at this time.  Fluid intake: Yes: 32oz a day     Patient confirms identification and approves PT to assess pelvic floor and treatment Yes      PLOF: Independent   PATIENT GOALS She wants to reduce pressure and tightness in pelvic floor    PERTINENT HISTORY:  Rt oophorectomy, endometriosis, ovarian cysts, depression, PCOS Sexual abuse: No   BOWEL MOVEMENT Pain with bowel movement: No Type of bowel movement:Frequency 1-2x/day and Strain No Fully empty rectum: Yes: - Leakage: No Pads: No Fiber supplement: No   URINATION Pain with urination: Yes - pressure Fully empty bladder: Yes: - Stream: Strong Urgency: Yes: pressure and discomfort when she has to go to the bathroom Frequency: every 1-2 hours - increased since surgery; she wakes 1-2x/night to urinate Leakage:  none Pads: No   INTERCOURSE Pain with intercourse:  not currently sexually active, no pain in the past Ability to have vaginal penetration:  Yes: - Climax: not sure     PREGNANCY Vaginal deliveries 0   C-section deliveries 0 Currently pregnant No     OBJECTIVE 11/08/21: Increase in superficial/deep pelvic floor tension with tenderness/pinching reported by patient.   09/29/21:  DIAGNOSTIC FINDINGS:  Urodynamics testing   COGNITION:            Overall cognitive status: Within functional limits for tasks assessed                          SENSATION:            Light touch: Appears intact            Proprioception: Appears intact   POSTURE:  Rounded shoulders, forward head, decreased lower thoracic kyphosis, increased anteiror pelvic tilt   LUMBARAROM/PROM:  decreased 25% all motions     PELVIC MMT: 2/5, endurance 4 seconds, repetitions 3 (not able to relax in between)         PALPATION:   General  tenderness to palpation direction over bladder and in Left lower quadrant                 External Perineal Exam WNL                             Internal Pelvic Floor tenderness and pain throughout superficial and deep pelvic floor bil   TONE: High   PROLAPSE: WNL   TODAY'S TREATMENT 11/24/21 Neuromuscular re-education: Core facilitation: Unilateral row with rotation 2 x 10, 10 lbs Pallof rotation 10x bil,  5lbs Exercises: Mobility: Ball up wall 10x Piriformis stretch 60 sec bil Hamstring stretch 60 sec bil Strengthening: Sidestepping 3 laps red band Monster walk 3 laps red band 3 way kick 10x ea, bil, red band Therapeutic activities: Functional strengthening activities: Squats 2 x 10, anterior weight hold Sumo squats 2 x 10 Lateral lunge 10x bil Reverse lunge 10x bil   TREATMENT 11/17/21 Manual: Scar tissue mobilization: Abdominal scar tissue Myofascial release: Abdominal/uterine release Internal pelvic floor techniques: No emotional/communication barriers or cognitive limitation. Patient  is motivated to learn. Patient understands and agrees with treatment goals and plan. PT explains patient will be examined in standing, sitting, and lying down to see how their muscles and joints work. When they are ready, they will be asked to remove their underwear so PT can examine their perineum. The patient is also given the option of providing their own chaperone as one is not provided in our facility. The patient also has the right and is explained the right to defer or refuse any part of the evaluation or treatment including the internal exam. With the patient's consent, PT will use one gloved finger to gently assess the muscles of the pelvic floor, seeing how well it contracts and relaxes and if there is muscle symmetry. After, the patient will get dressed and PT and patient will discuss exam findings and plan of care. PT and patient discuss plan of care, schedule, attendance policy and HEP activities. Neuromuscular re-education: Core facilitation: Single leg row with rotation 2 x 10 green band Pallof rotation 10x bil green band    TREATMENT 11/08/21: Manual: Scar tissue mobilization: Abdominal scar tissue Myofascial release: Abdominal/uterine release Internal pelvic floor techniques: No emotional/communication barriers or cognitive limitation. Patient is motivated to learn. Patient  understands and agrees with treatment goals and plan. PT explains patient will be examined in standing, sitting, and lying down to see how their muscles and joints work. When they are ready, they will be asked to remove their underwear so PT can examine their perineum. The patient is also given the option of providing their own chaperone as one is not provided in our facility. The patient also has the right and is explained the right to defer or refuse any part of the evaluation or treatment including the internal exam. With the patient's consent, PT will use one gloved finger to gently assess the muscles of the pelvic floor, seeing how well it contracts and relaxes and if there is muscle symmetry. After, the patient will get dressed and PT and patient will discuss exam findings and plan of care. PT and patient discuss plan of care, schedule, attendance policy and HEP activities. Superficial and deep Bil pelvic floor release Self-care: Pelvic floor wand        PATIENT EDUCATION:  Education details: HEP progressions Person educated: Patient Education method: Explanation, Demonstration, Tactile cues, Verbal cues, and Handouts Education comprehension: verbalized understanding     HOME EXERCISE PROGRAM: [id number]   ASSESSMENT:   CLINICAL IMPRESSION: Patient continues to make great progress. However, we discussed that she may need to reconsider use of pelvic floor wand if she sees return of pain/tension. With endometriosis, she may have tendency towards this tension and require way to release this tension greater than breathing and exercises from time to time. She was encouraged to perform regular self-abdominal massage for improved abdominal/pelvic floor mobility and circulation, which she reported she felt more comfortable with. She was able to perform many functional strengthening exercises to increase circulation, mobility, and pelvic/core support with no increase in pain and good form; we will  see how she tolerates these progressions over the next week before D/C next session. She will continue to benefit from skilled PT intervention in order to improve bladder function and decrease pelvic pain.      OBJECTIVE IMPAIRMENTS decreased activity tolerance, decreased coordination, decreased endurance, decreased mobility, decreased strength, hypomobility, increased fascial restrictions, increased muscle spasms, impaired tone, postural dysfunction, and pain.    ACTIVITY LIMITATIONS  daily activities .  PERSONAL FACTORS 3+ comorbidities: endometriosis, PCOS, oophorectomy  are also affecting patient's functional outcome.      REHAB POTENTIAL: Good   CLINICAL DECISION MAKING: Stable/uncomplicated   EVALUATION COMPLEXITY: Low     GOALS: Goals reviewed with patient? Yes   SHORT TERM GOALS: Target date: 10/27/2021 - updated 11/24/21   Pt will be independent with HEP.    Baseline: Goal status: MET   2.  Pt will be able to correctly perform diaphragmatic breathing and appropriate pressure management in order to improve pelvic floor A/ROM and relaxation.    Baseline:  Goal status: MET   3.  Pt will be able to teach back and utilize urge suppression technique and double-voiding in order to help reduce number of trips to the bathroom.     Baseline:  Goal status: MET       LONG TERM GOALS: Target date: 12/22/2021 - updated 11/24/21   Pt will be independent with advanced HEP.    Baseline:  Goal status: IN PROGRESS   2.  Pt will demonstrate normal pelvic floor muscle tone and A/ROM, able to achieve 4/5 strength with contractions and 10 sec endurance, in order to provide appropriate lumbopelvic support in functional activities.    Baseline:  Goal status: IN PROGRESS   3.  Pt will be able to go 2-3 hours in between voids without urgency or incontinence in order to improve QOL and perform all functional activities with less difficulty.    Baseline:  Goal status: MET   4.  Pt will  report decreased pelvic pain/pressure when having to void.  Baseline:  Goal status: MET   PLAN: PT FREQUENCY: 1x/week   PT DURATION: 12 weeks   PLANNED INTERVENTIONS: Therapeutic exercises, Therapeutic activity, Neuromuscular re-education, Balance training, Gait training, Patient/Family education, Joint mobilization, Dry Needling, Biofeedback, and Manual therapy   PLAN FOR NEXT SESSION: Continue to assess pelvic floor/abdominal restriction. Progress exercises to tolerance. D/C.    Heather Roberts, PT, DPT06/08/234:57 PM

## 2021-12-01 ENCOUNTER — Ambulatory Visit: Payer: BC Managed Care – PPO

## 2021-12-01 DIAGNOSIS — M6281 Muscle weakness (generalized): Secondary | ICD-10-CM | POA: Diagnosis not present

## 2021-12-01 DIAGNOSIS — R278 Other lack of coordination: Secondary | ICD-10-CM

## 2021-12-01 DIAGNOSIS — R252 Cramp and spasm: Secondary | ICD-10-CM

## 2021-12-01 NOTE — Therapy (Addendum)
OUTPATIENT PHYSICAL THERAPY TREATMENT NOTE   Patient Name: Kendra Allen MRN: [id number] DOB:1995-11-29, 26 y.o., female Today's Date: 12/01/2021  PCP: Cassandria Anger, MD REFERRING PROVIDER: Cassandria Anger, MD  END OF SESSION:   PT End of Session - 12/01/21 1617     Visit Number 10    Date for PT Re-Evaluation 12/22/21    Authorization Type [employer]    PT Start Time 1615    PT Stop Time 1655    PT Time Calculation (min) 40 min    Activity Tolerance Patient tolerated treatment well    Behavior During Therapy WFL for tasks assessed/performed                   Past Medical History:  Diagnosis Date   Anemia    Anxiety    Chest pain    Constipation    Depression    Endometriosis of pelvis    Family history of breast cancer    Family history of colon cancer    Family history of ovarian cancer    Family history of prostate cancer    Hidradenitis suppurativa    PCOS (polycystic ovarian syndrome)    Prediabetes    Sleep apnea    Vitamin D deficiency    Past Surgical History:  Procedure Laterality Date   CERVICAL POLYPECTOMY     DILATATION & CURETTAGE/HYSTEROSCOPY WITH MYOSURE N/A 11/21/2016   Procedure: DILATATION & CURETTAGE/HYSTEROSCOPY WITH MYOSURE;  [name], MD;  Location: [facility];  Service: Gynecology;  [name];   ROBOTIC ASSISTED LAPAROSCOPIC OVARIAN CYSTECTOMY N/A 12/30/2020   Procedure: XI ROBOTIC ASSISTED LAPAROSCOPIC RIGHT SALPINGO-OOPHORECTOMY WITH PELVIC WASHINGS;  [name], MD;  [name];  Service: Gynecology;  [name];   WISDOM TOOTH EXTRACTION     Patient Active Problem List   Diagnosis Date Noted   Ingrown toenail 07/25/2021   Endometriosis    Subserous leiomyoma of uterus    Anovulatory (dysfunctional uterine) bleeding 12/14/2020   Cyst of right ovary 12/14/2020   Cough 09/28/2020   Genetic testing 07/02/2020   Family history of breast cancer    Family history of  colon cancer    Family history of prostate cancer    Family history of ovarian cancer    Prediabetes    Well adult exam 04/12/2020   Snorings 04/12/2020   PCOS (polycystic ovarian syndrome) 12/04/2019   Vitamin D deficiency 03/31/2019   Hyperglycemia 03/31/2019   Anemia 01/21/2018   Midsternal chest pain 11/26/2015   Pain in joint, ankle and foot 03/30/2015   Apathy 03/26/2014   Headache 03/26/2014   PMS (premenstrual syndrome) 01/26/2014   Anxiety and depression 01/26/2014   Fever and chills 09/10/2013   Fatigue 09/10/2013   Anxiety attack 06/09/2013   Unspecified constipation 04/02/2013   Diarrhea 04/02/2013   Allergic rhinitis 02/01/2012   Otitis media 12/12/2011   Obesity 03/30/2010   Hidradenitis 12/02/2009    REFERRING DIAG: Z32.992 (ICD-10-CM) - Other muscle spasm N94.89 (ICD-10-CM) - Other specified conditions associated with female genital organs and menstrual cycle  THERAPY DIAG:  Muscle weakness (generalized)  Cramp and spasm  Other lack of coordination  PERTINENT HISTORY: Rt oophorectomy, endometriosis, ovarian cysts, PCOS  PRECAUTIONS: NA  SUBJECTIVE: Patient states that she had one sharp pain in Rt lower abdomen Monday for unknown reason. Otherwise, she states that she has had no other episodes of pain. She is wondering if it is associated with prolonged driving. She has  purchased pelvic floor muscle wand due to this in order to manage any increases in pain on her own moving forward. She would like to go over how to use wand. She is currently in 0/10 pain.   PAIN:  Are you having pain? No   SUBJECTIVE STATEMENT 09/29/2021: Pt states that in July 2022 she had cyst on ovary removed and she has been complications. She feels like pelvic floor is very tight and leading to bladder issues. Pt was here for pelvic floor PT, but did not have any internal work done, but she feels more prepared for this at this time.  Fluid intake: Yes: 32oz a day     Patient  confirms identification and approves PT to assess pelvic floor and treatment Yes      PLOF: Independent   PATIENT GOALS She wants to reduce pressure and tightness in pelvic floor   PERTINENT HISTORY:  Rt oophorectomy, endometriosis, ovarian cysts, depression, PCOS Sexual abuse: No   BOWEL MOVEMENT Pain with bowel movement: No Type of bowel movement:Frequency 1-2x/day and Strain No Fully empty rectum: Yes: - Leakage: No Pads: No Fiber supplement: No   URINATION Pain with urination: Yes - pressure Fully empty bladder: Yes: - Stream: Strong Urgency: Yes: pressure and discomfort when she has to go to the bathroom Frequency: every 1-2 hours - increased since surgery; she wakes 1-2x/night to urinate Leakage:  none Pads: No   INTERCOURSE Pain with intercourse:  not currently sexually active, no pain in the past Ability to have vaginal penetration:  Yes: - Climax: not sure     PREGNANCY Vaginal deliveries 0   C-section deliveries 0 Currently pregnant No     OBJECTIVE 12/01/21: Still superficial pelvic floor tenderness present in addition to deep bil levator ani restriction/tenderness. Overall pelvic floor muscle tone largely improved. Strength 3/5 with 5 second endurance.   11/08/21: Increase in superficial/deep pelvic floor tension with tenderness/pinching reported by patient.   09/29/21:  DIAGNOSTIC FINDINGS:  Urodynamics testing   COGNITION:            Overall cognitive status: Within functional limits for tasks assessed                          SENSATION:            Light touch: Appears intact            Proprioception: Appears intact   POSTURE:  Rounded shoulders, forward head, decreased lower thoracic kyphosis, increased anteiror pelvic tilt   LUMBARAROM/PROM:  decreased 25% all motions     PELVIC MMT: 2/5, endurance 4 seconds, repetitions 3 (not able to relax in between)         PALPATION:   General  tenderness to palpation direction over bladder and in  Left lower quadrant                 External Perineal Exam WNL                             Internal Pelvic Floor tenderness and pain throughout superficial and deep pelvic floor bil   TONE: High   PROLAPSE: WNL   TODAY'S TREATMENT 12/01/21 Manual: Soft tissue mobilization: Scar tissue mobilization: Myofascial release: Spinal mobilization: Internal pelvic floor techniques: No emotional/communication barriers or cognitive limitation. Patient is motivated to learn. Patient understands and agrees with treatment goals and plan. PT explains patient will  be examined in standing, sitting, and lying down to see how their muscles and joints work. When they are ready, they will be asked to remove their underwear so PT can examine their perineum. The patient is also given the option of providing their own chaperone as one is not provided in our facility. The patient also has the right and is explained the right to defer or refuse any part of the evaluation or treatment including the internal exam. With the patient's consent, PT will use one gloved finger to gently assess the muscles of the pelvic floor, seeing how well it contracts and relaxes and if there is muscle symmetry. After, the patient will get dressed and PT and patient will discuss exam findings and plan of care. PT and patient discuss plan of care, schedule, attendance policy and HEP activities. Superficial/deep pelvic floor muscle release Self-care: Pelvic floor wand education with hands on practice/PT demonstrating on wand   TREATMENT 11/24/21 Neuromuscular re-education: Core facilitation: Unilateral row with rotation 2 x 10, 10 lbs Pallof rotation 10x bil, 5lbs Exercises: Mobility: Ball up wall 10x Piriformis stretch 60 sec bil Hamstring stretch 60 sec bil Strengthening: Sidestepping 3 laps red band Monster walk 3 laps red band 3 way kick 10x ea, bil, red band Therapeutic activities: Functional strengthening  activities: Squats 2 x 10, anterior weight hold Sumo squats 2 x 10 Lateral lunge 10x bil Reverse lunge 10x bil   TREATMENT 11/17/21 Manual: Scar tissue mobilization: Abdominal scar tissue Myofascial release: Abdominal/uterine release Internal pelvic floor techniques: No emotional/communication barriers or cognitive limitation. Patient is motivated to learn. Patient understands and agrees with treatment goals and plan. PT explains patient will be examined in standing, sitting, and lying down to see how their muscles and joints work. When they are ready, they will be asked to remove their underwear so PT can examine their perineum. The patient is also given the option of providing their own chaperone as one is not provided in our facility. The patient also has the right and is explained the right to defer or refuse any part of the evaluation or treatment including the internal exam. With the patient's consent, PT will use one gloved finger to gently assess the muscles of the pelvic floor, seeing how well it contracts and relaxes and if there is muscle symmetry. After, the patient will get dressed and PT and patient will discuss exam findings and plan of care. PT and patient discuss plan of care, schedule, attendance policy and HEP activities. Neuromuscular re-education: Core facilitation: Single leg row with rotation 2 x 10 green band Pallof rotation 10x bil green band      PATIENT EDUCATION:  Education details: Pelvic floor wand education Person educated: Patient Education method: Explanation, Demonstration, Tactile cues, Verbal cues, and Handouts Education comprehension: verbalized understanding     HOME EXERCISE PROGRAM: [id number]   ASSESSMENT:   CLINICAL IMPRESSION: Patient overall making great progress, but we decided not to D/C today and make appointment in one month to make sure she feels confident pelvic floor wand/pain management moving forward. She does still demonstrate some  pelvic floor weakness/decreased endurance and restriction/tenderness in pelvic floor exam. She tolerated manual release of superficial and deep pelvic floor bil in order to replicate what she should feel with the wand. She performed release on herself with use of the wand while demonstration/guidance given. She reported feeling comfortable with wand at end of treatment session. She will continue to benefit from skilled PT intervention in order to  make sure she is comfortable with wand and pain management.      OBJECTIVE IMPAIRMENTS decreased activity tolerance, decreased coordination, decreased endurance, decreased mobility, decreased strength, hypomobility, increased fascial restrictions, increased muscle spasms, impaired tone, postural dysfunction, and pain.    ACTIVITY LIMITATIONS  daily activities .    PERSONAL FACTORS 3+ comorbidities: endometriosis, PCOS, oophorectomy  are also affecting patient's functional outcome.      REHAB POTENTIAL: Good   CLINICAL DECISION MAKING: Stable/uncomplicated   EVALUATION COMPLEXITY: Low     GOALS: Goals reviewed with patient? Yes   SHORT TERM GOALS: Target date: 10/27/2021 - updated 11/24/21   Pt will be independent with HEP.    Baseline: Goal status: MET 11/24/21   2.  Pt will be able to correctly perform diaphragmatic breathing and appropriate pressure management in order to improve pelvic floor A/ROM and relaxation.    Baseline:  Goal status: MET 11/24/21   3.  Pt will be able to teach back and utilize urge suppression technique and double-voiding in order to help reduce number of trips to the bathroom.     Baseline:  Goal status: MET 11/24/21       LONG TERM GOALS: Target date: 12/22/2021 - updated 12/01/21   Pt will be independent with advanced HEP.    Baseline:  Goal status: MET - 12/01/21   2.  Pt will demonstrate normal pelvic floor muscle tone and A/ROM, able to achieve 4/5 strength with contractions and 10 sec endurance, in order to  provide appropriate lumbopelvic support in functional activities.    Baseline: 3/5 strength, 5 second endurance Goal status: IN PROGRESS   3.  Pt will be able to go 2-3 hours in between voids without urgency or incontinence in order to improve QOL and perform all functional activities with less difficulty.    Baseline:  Goal status: MET 11/24/21   4.  Pt will report decreased pelvic pain/pressure when having to void.  Baseline:  Goal status: MET 11/24/21   PLAN: PT FREQUENCY: 1x/week   PT DURATION: 12 weeks   PLANNED INTERVENTIONS: Therapeutic exercises, Therapeutic activity, Neuromuscular re-education, Balance training, Gait training, Patient/Family education, Joint mobilization, Dry Needling, Biofeedback, and Manual therapy   PLAN FOR NEXT SESSION: Possible D/C next session; review pelvic floor muscle wand.   PHYSICAL THERAPY DISCHARGE SUMMARY  Visits from Start of Care: 10  Current functional level related to goals / functional outcomes: MET   Remaining deficits: See above   Education / Equipment: HEP   Patient agrees to discharge. Patient goals were met. Patient is being discharged due to meeting the stated rehab goals.   Heather Roberts, PT, DPT06/15/235:11 PM

## 2021-12-02 ENCOUNTER — Ambulatory Visit (INDEPENDENT_AMBULATORY_CARE_PROVIDER_SITE_OTHER): Payer: BC Managed Care – PPO | Admitting: Psychology

## 2021-12-02 DIAGNOSIS — F331 Major depressive disorder, recurrent, moderate: Secondary | ICD-10-CM

## 2021-12-02 NOTE — Progress Notes (Signed)
[place] Counselor/Therapist Progress Note  Patient ID: AUNESTY TYSON, MRN: [id number],    Date: 12/02/2021  Time Spent: 2:00pm-2:45pm    45 minutes   Treatment Type: Individual Therapy  Reported Symptoms:  stress, loneliness  Mental Status Exam: Appearance:  Casual     Behavior: Appropriate  Motor: Normal  Speech/Language:  Normal Rate  Affect: Appropriate  Mood: normal  Thought process: normal  Thought content:   WNL  Sensory/Perceptual disturbances:   WNL  Orientation: oriented to person, place, time/date, and situation  Attention: Good  Concentration: Good  Memory: WNL  Fund of knowledge:  Good  Insight:   Good  Judgment:  Good  Impulse Control: Good   Risk Assessment: Danger to Self:  No Self-injurious Behavior: No Danger to Others: No Duty to Warn:no Physical Aggression / Violence:No  Access to Firearms a concern: No  Gang Involvement:No   Subjective: Pt present for face-to-face individual therapy via video Webex.  Pt consents to telehealth video session due to COVID 19 pandemic. Location of pt: home Location of therapist: home office.  Pt talked about starting her anatomy class and lab.   Pt states it is a lot of information to learn and memorize.  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  Pt states she has stated to feel lonely more even though she is around people more.   Most of pt's friends are working and not available during the day.  Addressed pt's loneliness and how she can increase social connection.   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   Pt has been worrying about her future. Addressed pt's worries.   Worked on worry management and thought reframing.   Pt has been napping every day bc she is lonely and she still sleeps 8+ hours a night.   Pt has not been going to the pool with her friend bc of pt's summer school schedule.   Addressed how pt can add in exercise with her new  schedule.    Worked on self care strategies.   Worked on positive affirmations.   Provided supportive therapy.   Interventions: Cognitive Behavioral Therapy and Insight-Oriented  Diagnosis: F33.1  Plan:  Plan to continue to meet every two weeks.   Treatment Plan  (treatment plan target date: 06/28/2022). Client Abilities/Strengths  Pt is bright and motivated for therapy. Client Treatment Preferences  Individual therapy.  Client Statement of Needs  Improve coping skills.  Symptoms  Depressed or irritable mood.  Diminished interest in or enjoyment of activities.  Problems Addressed  Unipolar Depression Goals 1. Alleviate depressive symptoms and return to previous level of effective functioning. 2. Appropriately grieve the loss in order to normalize mood and to return to previously adaptive level of functioning. Objective Learn and implement behavioral strategies to overcome depression. Target Date: 2022-06-28 Frequency: Biweekly  Progress: 10 Modality: individual  Related Interventions Engage the client in "behavioral activation," increasing his/her activity level and contact with sources of reward, while identifying processes that inhibit activation.  Use behavioral techniques such as instruction, rehearsal, role-playing, role reversal, as needed, to facilitate activity in the client's daily life; reinforce success. Assist the client in developing skills that increase the likelihood of deriving pleasure from behavioral activation (e.g., assertiveness skills, developing an exercise plan, less internal/more external focus, increased social involvement); reinforce success. Objective Identify important people in life, past and present, and describe the quality, good and poor, of those relationships. Target  Date: 2022-06-28 Frequency: Biweekly  Progress: 10 Modality: individual  Related Interventions Conduct Interpersonal Therapy beginning with the assessment of the client's  "interpersonal inventory" of important past and present relationships; develop a case formulation linking depression to grief, interpersonal role disputes, role transitions, and/or interpersonal deficits). Objective Learn and implement problem-solving and decision-making skills. Target Date: 2022-06-28 Frequency: Biweekly  Progress: 10 Modality: individual  Related Interventions Conduct Problem-Solving Therapy using techniques such as psychoeducation, modeling, and role-playing to teach client problem-solving skills (i.e., defining a problem specifically, generating possible solutions, evaluating the pros and cons of each solution, selecting and implementing a plan of action, evaluating the efficacy of the plan, accepting or revising the plan); role-play application of the problem-solving skill to a real life issue. Encourage in the client the development of a positive problem orientation in which problems and solving them are viewed as a natural part of life and not something to be feared, despaired, or avoided. 3. Develop healthy interpersonal relationships that lead to the alleviation and help prevent the relapse of depression. 4. Develop healthy thinking patterns and beliefs about self, others, and the world that lead to the alleviation and help prevent the relapse of depression. 5. Recognize, accept, and cope with feelings of depression. Diagnosis F33.1  Conditions For Discharge Achievement of treatment goals and objectives   Clint Bolder, LCSW

## 2021-12-16 ENCOUNTER — Ambulatory Visit (INDEPENDENT_AMBULATORY_CARE_PROVIDER_SITE_OTHER): Payer: BC Managed Care – PPO | Admitting: Psychology

## 2021-12-16 DIAGNOSIS — F331 Major depressive disorder, recurrent, moderate: Secondary | ICD-10-CM

## 2021-12-16 NOTE — Progress Notes (Signed)
[place] Counselor/Therapist Progress Note  Patient ID: AMBRIELLE KINGTON, MRN: [id number],    Date: 12/16/2021  Time Spent: 3:00pm-3:45pm    45 minutes   Treatment Type: Individual Therapy  Reported Symptoms:  stress, loneliness  Mental Status Exam: Appearance:  Casual     Behavior: Appropriate  Motor: Normal  Speech/Language:  Normal Rate  Affect: Appropriate  Mood: normal  Thought process: normal  Thought content:   WNL  Sensory/Perceptual disturbances:   WNL  Orientation: oriented to person, place, time/date, and situation  Attention: Good  Concentration: Good  Memory: WNL  Fund of knowledge:  Good  Insight:   Good  Judgment:  Good  Impulse Control: Good   Risk Assessment: Danger to Self:  No Self-injurious Behavior: No Danger to Others: No Duty to Warn:no Physical Aggression / Violence:No  Access to Firearms a concern: No  Gang Involvement:No   Subjective: Pt present for face-to-face individual therapy via video Webex.  Pt consents to telehealth video session due to COVID 19 pandemic. Location of pt: home Location of therapist: home office.  Pt talked about her anatomy class and lab.   Pt states it is a lot of information to learn and memorize.  Pt is tested every week.  Pt has made in the 80's.  Pt wants to improve her grades.  Pt is working hard and doing a good job studying.  Pt shows up for every class.   Pt is waiting to hear from [facility] about being able to shadow an OT professional. Pt has been worrying about her future. Addressed pt's worries.   Worked on worry management and thought reframing.   Pt has started to go to the gym again.  She babysat as well.   Pt talked about her mood.  She states she feels stable but at times is tired.   Worked on self care strategies.   Worked on positive affirmations.   Provided supportive therapy.   Interventions: Cognitive Behavioral Therapy and Insight-Oriented  Diagnosis: F33.1  Plan:  Plan  to continue to meet every two weeks.   Treatment Plan  (treatment plan target date: 06/28/2022). Client Abilities/Strengths  Pt is bright and motivated for therapy. Client Treatment Preferences  Individual therapy.  Client Statement of Needs  Improve coping skills.  Symptoms  Depressed or irritable mood.  Diminished interest in or enjoyment of activities.  Problems Addressed  Unipolar Depression Goals 1. Alleviate depressive symptoms and return to previous level of effective functioning. 2. Appropriately grieve the loss in order to normalize mood and to return to previously adaptive level of functioning. Objective Learn and implement behavioral strategies to overcome depression. Target Date: 2022-06-28 Frequency: Biweekly  Progress: 10 Modality: individual  Related Interventions Engage the client in "behavioral activation," increasing his/her activity level and contact with sources of reward, while identifying processes that inhibit activation.  Use behavioral techniques such as instruction, rehearsal, role-playing, role reversal, as needed, to facilitate activity in the client's daily life; reinforce success. Assist the client in developing skills that increase the likelihood of deriving pleasure from behavioral activation (e.g., assertiveness skills, developing an exercise plan, less internal/more external focus, increased social involvement); reinforce success. Objective Identify important people in life, past and present, and describe the quality, good and poor, of those relationships. Target Date: 2022-06-28 Frequency: Biweekly  Progress: 10 Modality: individual  Related Interventions Conduct Interpersonal Therapy beginning with the assessment of the client's "interpersonal inventory" of important past and present relationships; develop a case formulation  linking depression to grief, interpersonal role disputes, role transitions, and/or interpersonal deficits). Objective Learn and  implement problem-solving and decision-making skills. Target Date: 2022-06-28 Frequency: Biweekly  Progress: 10 Modality: individual  Related Interventions Conduct Problem-Solving Therapy using techniques such as psychoeducation, modeling, and role-playing to teach client problem-solving skills (i.e., defining a problem specifically, generating possible solutions, evaluating the pros and cons of each solution, selecting and implementing a plan of action, evaluating the efficacy of the plan, accepting or revising the plan); role-play application of the problem-solving skill to a real life issue. Encourage in the client the development of a positive problem orientation in which problems and solving them are viewed as a natural part of life and not something to be feared, despaired, or avoided. 3. Develop healthy interpersonal relationships that lead to the alleviation and help prevent the relapse of depression. 4. Develop healthy thinking patterns and beliefs about self, others, and the world that lead to the alleviation and help prevent the relapse of depression. 5. Recognize, accept, and cope with feelings of depression. Diagnosis F33.1  Conditions For Discharge Achievement of treatment goals and objectives   Clint Bolder, LCSW

## 2021-12-27 ENCOUNTER — Ambulatory Visit (INDEPENDENT_AMBULATORY_CARE_PROVIDER_SITE_OTHER): Payer: BC Managed Care – PPO | Admitting: Psychology

## 2021-12-27 DIAGNOSIS — F331 Major depressive disorder, recurrent, moderate: Secondary | ICD-10-CM

## 2021-12-27 NOTE — Progress Notes (Signed)
[facility] Counselor/Therapist Progress Note  Patient ID: VEANNA DOWER, MRN: [id number],    Date: 12/27/2021  Time Spent: 5:00pm-5:45pm    45 minutes   Treatment Type: Individual Therapy  Reported Symptoms:  stress, loneliness  Mental Status Exam: Appearance:  Casual     Behavior: Appropriate  Motor: Normal  Speech/Language:  Normal Rate  Affect: Appropriate  Mood: normal  Thought process: normal  Thought content:   WNL  Sensory/Perceptual disturbances:   WNL  Orientation: oriented to person, place, time/date, and situation  Attention: Good  Concentration: Good  Memory: WNL  Fund of knowledge:  Good  Insight:   Good  Judgment:  Good  Impulse Control: Good   Risk Assessment: Danger to Self:  No Self-injurious Behavior: No Danger to Others: No Duty to Warn:no Physical Aggression / Violence:No  Access to Firearms a concern: No  Gang Involvement:No   Subjective: Pt present for face-to-face individual therapy via video Webex.  Pt consents to telehealth video session due to COVID 19 pandemic. Location of pt: home Location of therapist: home office.  Pt talked about shadowing Occupational Therapists at [facility].   Pt started yesterday and she will shadow for 3 days a week for 3 hours a day.  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   Pt continues to take summer classes and is doing well.  Pt is working on getting her grades up from a B to an A.  She is making good progress working toward her goals.   Pt and her mother have been going to the gym in the evenings.   Pt states it is a lot of work to try to be active and healthy.   Pt has been intentional about being more active and social on the weekends.   Acknowledged pt for the nudges she gives herself to push through discomfort to do the things she knows are good for her.   Pt continues to worry about the future.  She is worried about whether she can get into an OT graduate program.  Worked on worry management and  thought reframing.   Worked on self care strategies.   Worked on positive affirmations.   Provided supportive therapy.   Interventions: Cognitive Behavioral Therapy and Insight-Oriented  Diagnosis: F33.1  Plan:  Plan to continue to meet every two weeks.   Treatment Plan  (treatment plan target date: 06/28/2022). Client Abilities/Strengths  Pt is bright and motivated for therapy. Client Treatment Preferences  Individual therapy.  Client Statement of Needs  Improve coping skills.  Symptoms  Depressed or irritable mood.  Diminished interest in or enjoyment of activities.  Problems Addressed  Unipolar Depression Goals 1. Alleviate depressive symptoms and return to previous level of effective functioning. 2. Appropriately grieve the loss in order to normalize mood and to return to previously adaptive level of functioning. Objective Learn and implement behavioral strategies to overcome depression. Target Date: 2022-06-28 Frequency: Biweekly  Progress: 10 Modality: individual  Related Interventions Engage the client in "behavioral activation," increasing his/her activity level and contact with sources of reward, while identifying processes that inhibit activation.  Use behavioral techniques such as instruction, rehearsal, role-playing, role reversal, as needed, to facilitate activity in the client's daily life; reinforce success. Assist the client in developing skills that increase the likelihood of deriving pleasure from behavioral activation (e.g., assertiveness skills, developing an exercise plan, less internal/more external focus, increased social involvement); reinforce success. Objective Identify important people in life, past and present, and  describe the quality, good and poor, of those relationships. Target Date: 2022-06-28 Frequency: Biweekly  Progress: 10 Modality: individual  Related Interventions Conduct Interpersonal Therapy beginning with the assessment of the client's  "interpersonal inventory" of important past and present relationships; develop a case formulation linking depression to grief, interpersonal role disputes, role transitions, and/or interpersonal deficits). Objective Learn and implement problem-solving and decision-making skills. Target Date: 2022-06-28 Frequency: Biweekly  Progress: 10 Modality: individual  Related Interventions Conduct Problem-Solving Therapy using techniques such as psychoeducation, modeling, and role-playing to teach client problem-solving skills (i.e., defining a problem specifically, generating possible solutions, evaluating the pros and cons of each solution, selecting and implementing a plan of action, evaluating the efficacy of the plan, accepting or revising the plan); role-play application of the problem-solving skill to a real life issue. Encourage in the client the development of a positive problem orientation in which problems and solving them are viewed as a natural part of life and not something to be feared, despaired, or avoided. 3. Develop healthy interpersonal relationships that lead to the alleviation and help prevent the relapse of depression. 4. Develop healthy thinking patterns and beliefs about self, others, and the world that lead to the alleviation and help prevent the relapse of depression. 5. Recognize, accept, and cope with feelings of depression. Diagnosis F33.1  Conditions For Discharge Achievement of treatment goals and objectives   Clint Bolder, LCSW

## 2022-01-04 ENCOUNTER — Other Ambulatory Visit: Payer: Self-pay | Admitting: Internal Medicine

## 2022-01-05 ENCOUNTER — Encounter: Payer: Self-pay | Admitting: Obstetrics and Gynecology

## 2022-01-16 ENCOUNTER — Ambulatory Visit (INDEPENDENT_AMBULATORY_CARE_PROVIDER_SITE_OTHER): Payer: BC Managed Care – PPO | Admitting: Psychology

## 2022-01-16 DIAGNOSIS — F331 Major depressive disorder, recurrent, moderate: Secondary | ICD-10-CM

## 2022-01-16 NOTE — Progress Notes (Signed)
[facility] [facility] Counselor/Therapist Progress Note  Patient ID: KAMIKA GOODLOE, MRN: [id number],    Date: 01/16/2022  Time Spent: 5:00pm-5:45pm    45 minutes   Treatment Type: Individual Therapy  Reported Symptoms:  stress, loneliness  Mental Status Exam: Appearance:  Casual     Behavior: Appropriate  Motor: Normal  Speech/Language:  Normal Rate  Affect: Appropriate  Mood: normal  Thought process: normal  Thought content:   WNL  Sensory/Perceptual disturbances:   WNL  Orientation: oriented to person, place, time/date, and situation  Attention: Good  Concentration: Good  Memory: WNL  Fund of knowledge:  Good  Insight:   Good  Judgment:  Good  Impulse Control: Good   Risk Assessment: Danger to Self:  No Self-injurious Behavior: No Danger to Others: No Duty to Warn:no Physical Aggression / Violence:No  Access to Firearms a concern: No  Gang Involvement:No   Subjective: Pt present for face-to-face individual therapy via video Webex.  Pt consents to telehealth video session due to COVID 19 pandemic. Location of pt: home Location of therapist: home office.  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   Pt sees it as a positive thing since it gives her the opportunity to look for a job she would like better and that would pay more.   This has given pt some extra time so she has been working on organizational things and writing her personal statement for graduate school applications.   Pt got all of her OT shadowing hours completed.  It was a good experience for her and pt continues to feel positive about going into OT.   Pt has finished up her summer classes and finished with a high B.  She is happy with her work.   Pt has a couple of weeks off before starting fall semester classes. Pt has been feeling lonely now that she has a lot of time alone at home.   Addressed how pt can structure her time and increase social  connection.   Worked with pt on making a schedule to give her days more structure.     Worked on self care strategies.   Worked on positive affirmations.   Provided supportive therapy.   Interventions: Cognitive Behavioral Therapy and Insight-Oriented  Diagnosis: F33.1  Plan:  Plan to continue to meet every two weeks.   Treatment Plan  (treatment plan target date: 06/28/2022). Client Abilities/Strengths  Pt is bright and motivated for therapy. Client Treatment Preferences  Individual therapy.  Client Statement of Needs  Improve coping skills.  Symptoms  Depressed or irritable mood.  Diminished interest in or enjoyment of activities.  Problems Addressed  Unipolar Depression Goals 1. Alleviate depressive symptoms and return to previous level of effective functioning. 2. Appropriately grieve the loss in order to normalize mood and to return to previously adaptive level of functioning. Objective Learn and implement behavioral strategies to overcome depression. Target Date: 2022-06-28 Frequency: Biweekly  Progress: 10 Modality: individual  Related Interventions Engage the client in "behavioral activation," increasing his/her activity level and contact with sources of reward, while identifying processes that inhibit activation.  Use behavioral techniques such as instruction, rehearsal, role-playing, role reversal, as needed, to facilitate activity in the client's daily life; reinforce success. Assist the client in developing skills that increase the likelihood of deriving pleasure from behavioral activation (e.g., assertiveness skills, developing an exercise plan, less internal/more external focus, increased social involvement); reinforce success.  Objective Identify important people in life, past and present, and describe the quality, good and poor, of those relationships. Target Date: 2022-06-28 Frequency: Biweekly  Progress: 10 Modality: individual  Related Interventions Conduct  Interpersonal Therapy beginning with the assessment of the client's "interpersonal inventory" of important past and present relationships; develop a case formulation linking depression to grief, interpersonal role disputes, role transitions, and/or interpersonal deficits). Objective Learn and implement problem-solving and decision-making skills. Target Date: 2022-06-28 Frequency: Biweekly  Progress: 10 Modality: individual  Related Interventions Conduct Problem-Solving Therapy using techniques such as psychoeducation, modeling, and role-playing to teach client problem-solving skills (i.e., defining a problem specifically, generating possible solutions, evaluating the pros and cons of each solution, selecting and implementing a plan of action, evaluating the efficacy of the plan, accepting or revising the plan); role-play application of the problem-solving skill to a real life issue. Encourage in the client the development of a positive problem orientation in which problems and solving them are viewed as a natural part of life and not something to be feared, despaired, or avoided. 3. Develop healthy interpersonal relationships that lead to the alleviation and help prevent the relapse of depression. 4. Develop healthy thinking patterns and beliefs about self, others, and the world that lead to the alleviation and help prevent the relapse of depression. 5. Recognize, accept, and cope with feelings of depression. Diagnosis F33.1  Conditions For Discharge Achievement of treatment goals and objectives   Clint Bolder, LCSW

## 2022-01-18 ENCOUNTER — Ambulatory Visit: Payer: BC Managed Care – PPO | Admitting: Internal Medicine

## 2022-01-25 ENCOUNTER — Encounter (INDEPENDENT_AMBULATORY_CARE_PROVIDER_SITE_OTHER): Payer: Self-pay

## 2022-01-26 ENCOUNTER — Ambulatory Visit: Payer: BC Managed Care – PPO | Admitting: Internal Medicine

## 2022-01-30 ENCOUNTER — Ambulatory Visit (INDEPENDENT_AMBULATORY_CARE_PROVIDER_SITE_OTHER): Payer: BC Managed Care – PPO | Admitting: Psychology

## 2022-01-30 DIAGNOSIS — F331 Major depressive disorder, recurrent, moderate: Secondary | ICD-10-CM

## 2022-01-30 NOTE — Progress Notes (Signed)
[place] Counselor/Therapist Progress Note  Patient ID: Kendra Allen, MRN: [id number],    Date: 01/30/2022  Time Spent: 5:00pm-5:45pm    45 minutes   Treatment Type: Individual Therapy  Reported Symptoms:  stress, decrease motivation.   Mental Status Exam: Appearance:  Casual     Behavior: Appropriate  Motor: Normal  Speech/Language:  Normal Rate  Affect: Appropriate  Mood: normal  Thought process: normal  Thought content:   WNL  Sensory/Perceptual disturbances:   WNL  Orientation: oriented to person, place, time/date, and situation  Attention: Good  Concentration: Good  Memory: WNL  Fund of knowledge:  Good  Insight:   Good  Judgment:  Good  Impulse Control: Good   Risk Assessment: Danger to Self:  No Self-injurious Behavior: No Danger to Others: No Duty to Warn:no Physical Aggression / Violence:No  Access to Firearms a concern: No  Gang Involvement:No   Subjective: Pt present for face-to-face individual therapy via video Webex.  Pt consents to telehealth video session due to COVID 19 pandemic. Location of pt: home Location of therapist: home office.  Pt talked about today being the first day of classes for the fall semester.   She is feeling ready bc she wants something to do with her time.  Pt states the past couple of weeks she has been feeling depressed and unmotivated.   Addressed what the triggers are for pt's feelings.  She has been applying for a lot of jobs and getting a lot of rejections which has been deflating for her.  Pt is feeling "not good enough".  Addressed pt's self talk and worked on thought reframing.   Pt states she has been sleeping most of the day.   She did make progress with writing her personal statement for graduate school applications.    Worked on self care strategies.   Worked on positive affirmations.   Provided supportive therapy.   Interventions: Cognitive Behavioral Therapy and Insight-Oriented  Diagnosis:  F33.1  Plan:  Plan to continue to meet every two weeks.   Treatment Plan  (treatment plan target date: 06/28/2022). Client Abilities/Strengths  Pt is bright and motivated for therapy. Client Treatment Preferences  Individual therapy.  Client Statement of Needs  Improve coping skills.  Symptoms  Depressed or irritable mood.  Diminished interest in or enjoyment of activities.  Problems Addressed  Unipolar Depression Goals 1. Alleviate depressive symptoms and return to previous level of effective functioning. 2. Appropriately grieve the loss in order to normalize mood and to return to previously adaptive level of functioning. Objective Learn and implement behavioral strategies to overcome depression. Target Date: 2022-06-28 Frequency: Biweekly  Progress: 10 Modality: individual  Related Interventions Engage the client in "behavioral activation," increasing his/her activity level and contact with sources of reward, while identifying processes that inhibit activation.  Use behavioral techniques such as instruction, rehearsal, role-playing, role reversal, as needed, to facilitate activity in the client's daily life; reinforce success. Assist the client in developing skills that increase the likelihood of deriving pleasure from behavioral activation (e.g., assertiveness skills, developing an exercise plan, less internal/more external focus, increased social involvement); reinforce success. Objective Identify important people in life, past and present, and describe the quality, good and poor, of those relationships. Target Date: 2022-06-28 Frequency: Biweekly  Progress: 10 Modality: individual  Related Interventions Conduct Interpersonal Therapy beginning with the assessment of the client's "interpersonal inventory" of important past and present relationships; develop a case formulation linking depression to grief, interpersonal role disputes, role  transitions, and/or interpersonal  deficits). Objective Learn and implement problem-solving and decision-making skills. Target Date: 2022-06-28 Frequency: Biweekly  Progress: 10 Modality: individual  Related Interventions Conduct Problem-Solving Therapy using techniques such as psychoeducation, modeling, and role-playing to teach client problem-solving skills (i.e., defining a problem specifically, generating possible solutions, evaluating the pros and cons of each solution, selecting and implementing a plan of action, evaluating the efficacy of the plan, accepting or revising the plan); role-play application of the problem-solving skill to a real life issue. Encourage in the client the development of a positive problem orientation in which problems and solving them are viewed as a natural part of life and not something to be feared, despaired, or avoided. 3. Develop healthy interpersonal relationships that lead to the alleviation and help prevent the relapse of depression. 4. Develop healthy thinking patterns and beliefs about self, others, and the world that lead to the alleviation and help prevent the relapse of depression. 5. Recognize, accept, and cope with feelings of depression. Diagnosis F33.1  Conditions For Discharge Achievement of treatment goals and objectives   Clint Bolder, LCSW

## 2022-02-13 ENCOUNTER — Ambulatory Visit (INDEPENDENT_AMBULATORY_CARE_PROVIDER_SITE_OTHER): Payer: BC Managed Care – PPO | Admitting: Psychology

## 2022-02-13 DIAGNOSIS — F331 Major depressive disorder, recurrent, moderate: Secondary | ICD-10-CM | POA: Diagnosis not present

## 2022-02-13 NOTE — Progress Notes (Signed)
[facility] Counselor/Therapist Progress Note  Patient ID: Kendra Allen, MRN: [id number],    Date: 02/13/2022  Time Spent: 5:00pm-5:45pm    45 minutes   Treatment Type: Individual Therapy  Reported Symptoms:  stress, decrease motivation.   Mental Status Exam: Appearance:  Casual     Behavior: Appropriate  Motor: Normal  Speech/Language:  Normal Rate  Affect: Appropriate  Mood: normal  Thought process: normal  Thought content:   WNL  Sensory/Perceptual disturbances:   WNL  Orientation: oriented to person, place, time/date, and situation  Attention: Good  Concentration: Good  Memory: WNL  Fund of knowledge:  Good  Insight:   Good  Judgment:  Good  Impulse Control: Good   Risk Assessment: Danger to Self:  No Self-injurious Behavior: No Danger to Others: No Duty to Warn:no Physical Aggression / Violence:No  Access to Firearms a concern: No  Gang Involvement:No   Subjective: Pt present for face-to-face individual therapy via video Webex.  Pt consents to telehealth video session due to COVID 19 pandemic. Location of pt: home Location of therapist: home office.  Pt talked about college.  Pt states the work feels manageable.   Pt talked about the job search.  She has stopped looking for a job now bc it was stressing her out to not get positive responses.   Pt states she has been hard on herself lately.   Addressed pt's negative self talk.   Pt is feeling "not good enough".  Addressed pt's self talk and worked on thought reframing.   Pt is still working on graduate school application to [employer].    Worked on self care strategies.   Worked on positive affirmations.   Provided supportive therapy.   Interventions: Cognitive Behavioral Therapy and Insight-Oriented  Diagnosis: F33.1  Plan:  Plan to continue to meet every two weeks.   Treatment Plan  (treatment plan target date: 06/28/2022). Client Abilities/Strengths  Pt is bright and  motivated for therapy. Client Treatment Preferences  Individual therapy.  Client Statement of Needs  Improve coping skills.  Symptoms  Depressed or irritable mood.  Diminished interest in or enjoyment of activities.  Problems Addressed  Unipolar Depression Goals 1. Alleviate depressive symptoms and return to previous level of effective functioning. 2. Appropriately grieve the loss in order to normalize mood and to return to previously adaptive level of functioning. Objective Learn and implement behavioral strategies to overcome depression. Target Date: 2022-06-28 Frequency: Biweekly  Progress: 10 Modality: individual  Related Interventions Engage the client in "behavioral activation," increasing his/her activity level and contact with sources of reward, while identifying processes that inhibit activation.  Use behavioral techniques such as instruction, rehearsal, role-playing, role reversal, as needed, to facilitate activity in the client's daily life; reinforce success. Assist the client in developing skills that increase the likelihood of deriving pleasure from behavioral activation (e.g., assertiveness skills, developing an exercise plan, less internal/more external focus, increased social involvement); reinforce success. Objective Identify important people in life, past and present, and describe the quality, good and poor, of those relationships. Target Date: 2022-06-28 Frequency: Biweekly  Progress: 10 Modality: individual  Related Interventions Conduct Interpersonal Therapy beginning with the assessment of the client's "interpersonal inventory" of important past and present relationships; develop a case formulation linking depression to grief, interpersonal role disputes, role transitions, and/or interpersonal deficits). Objective Learn and implement problem-solving and decision-making skills. Target Date: 2022-06-28 Frequency: Biweekly  Progress: 10 Modality: individual  Related  Interventions Conduct Problem-Solving Therapy using  techniques such as psychoeducation, modeling, and role-playing to teach client problem-solving skills (i.e., defining a problem specifically, generating possible solutions, evaluating the pros and cons of each solution, selecting and implementing a plan of action, evaluating the efficacy of the plan, accepting or revising the plan); role-play application of the problem-solving skill to a real life issue. Encourage in the client the development of a positive problem orientation in which problems and solving them are viewed as a natural part of life and not something to be feared, despaired, or avoided. 3. Develop healthy interpersonal relationships that lead to the alleviation and help prevent the relapse of depression. 4. Develop healthy thinking patterns and beliefs about self, others, and the world that lead to the alleviation and help prevent the relapse of depression. 5. Recognize, accept, and cope with feelings of depression. Diagnosis F33.1  Conditions For Discharge Achievement of treatment goals and objectives   Clint Bolder, LCSW

## 2022-03-02 ENCOUNTER — Ambulatory Visit (INDEPENDENT_AMBULATORY_CARE_PROVIDER_SITE_OTHER): Payer: BC Managed Care – PPO | Admitting: Psychology

## 2022-03-02 DIAGNOSIS — F331 Major depressive disorder, recurrent, moderate: Secondary | ICD-10-CM | POA: Diagnosis not present

## 2022-03-02 NOTE — Progress Notes (Signed)
[facility] Counselor/Therapist Progress Note  Patient ID: Kendra Allen, MRN: [id number],    Date: 03/02/2022  Time Spent: 5:00pm-5:45pm    45 minutes   Treatment Type: Individual Therapy  Reported Symptoms:  stress  Mental Status Exam: Appearance:  Casual     Behavior: Appropriate  Motor: Normal  Speech/Language:  Normal Rate  Affect: Appropriate  Mood: normal  Thought process: normal  Thought content:   WNL  Sensory/Perceptual disturbances:   WNL  Orientation: oriented to person, place, time/date, and situation  Attention: Good  Concentration: Good  Memory: WNL  Fund of knowledge:  Good  Insight:   Good  Judgment:  Good  Impulse Control: Good   Risk Assessment: Danger to Self:  No Self-injurious Behavior: No Danger to Others: No Duty to Warn:no Physical Aggression / Violence:No  Access to Firearms a concern: No  Gang Involvement:No   Subjective: Pt present for face-to-face individual therapy via video Webex.  Pt consents to telehealth video session due to COVID 19 pandemic. Location of pt: home Location of therapist: home office.  Pt talked about her fears about getting depressed this winter.  Addressed that there are many supports in place now that were not there last year when she got so depressed.   She will continue to access these supports such as medication, therapy, staying active with exercise and friends.  Pt has been applying for a job which is helping give her something to do. Pt is going to water aerobics 3 times a week.   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    Pt is working on taking action steps even when she does not feel like it.   Worked on self care strategies.   Worked on positive affirmations.   Encouraged pt to journal.  Provided supportive therapy.   Interventions: Cognitive Behavioral Therapy and Insight-Oriented  Diagnosis: F33.1  Plan:  Plan to continue to meet every two weeks.    Treatment Plan  (treatment plan target date: 06/28/2022). Client Abilities/Strengths  Pt is bright and motivated for therapy. Client Treatment Preferences  Individual therapy.  Client Statement of Needs  Improve coping skills.  Symptoms  Depressed or irritable mood.  Diminished interest in or enjoyment of activities.  Problems Addressed  Unipolar Depression Goals 1. Alleviate depressive symptoms and return to previous level of effective functioning. 2. Appropriately grieve the loss in order to normalize mood and to return to previously adaptive level of functioning. Objective Learn and implement behavioral strategies to overcome depression. Target Date: 2022-06-28 Frequency: Biweekly  Progress: 10 Modality: individual  Related Interventions Engage the client in "behavioral activation," increasing his/her activity level and contact with sources of reward, while identifying processes that inhibit activation.  Use behavioral techniques such as instruction, rehearsal, role-playing, role reversal, as needed, to facilitate activity in the client's daily life; reinforce success. Assist the client in developing skills that increase the likelihood of deriving pleasure from behavioral activation (e.g., assertiveness skills, developing an exercise plan, less internal/more external focus, increased social involvement); reinforce success. Objective Identify important people in life, past and present, and describe the quality, good and poor, of those relationships. Target Date: 2022-06-28 Frequency: Biweekly  Progress: 10 Modality: individual  Related Interventions Conduct Interpersonal Therapy beginning with the assessment of the client's "interpersonal inventory" of important past and present relationships; develop a case formulation linking depression to grief, interpersonal role disputes, role transitions, and/or interpersonal deficits). Objective Learn and implement problem-solving and  decision-making skills. Target Date: 2022-06-28 Frequency: Biweekly  Progress: 10 Modality: individual  Related Interventions Conduct Problem-Solving Therapy using techniques such as psychoeducation, modeling, and role-playing to teach client problem-solving skills (i.e., defining a problem specifically, generating possible solutions, evaluating the pros and cons of each solution, selecting and implementing a plan of action, evaluating the efficacy of the plan, accepting or revising the plan); role-play application of the problem-solving skill to a real life issue. Encourage in the client the development of a positive problem orientation in which problems and solving them are viewed as a natural part of life and not something to be feared, despaired, or avoided. 3. Develop healthy interpersonal relationships that lead to the alleviation and help prevent the relapse of depression. 4. Develop healthy thinking patterns and beliefs about self, others, and the world that lead to the alleviation and help prevent the relapse of depression. 5. Recognize, accept, and cope with feelings of depression. Diagnosis F33.1  Conditions For Discharge Achievement of treatment goals and objectives   Clint Bolder, LCSW

## 2022-03-16 ENCOUNTER — Ambulatory Visit: Payer: BC Managed Care – PPO | Admitting: Psychology

## 2022-03-27 ENCOUNTER — Ambulatory Visit (INDEPENDENT_AMBULATORY_CARE_PROVIDER_SITE_OTHER): Payer: BC Managed Care – PPO | Admitting: Psychology

## 2022-03-27 DIAGNOSIS — F331 Major depressive disorder, recurrent, moderate: Secondary | ICD-10-CM | POA: Diagnosis not present

## 2022-03-27 NOTE — Progress Notes (Signed)
[facility] Counselor/Therapist Progress Note  Patient ID: SEVANA GRANDINETTI, MRN: [id number],    Date: 03/27/2022  Time Spent: 5:00pm-5:45pm    45 minutes   Treatment Type: Individual Therapy  Reported Symptoms:  stress  Mental Status Exam: Appearance:  Casual     Behavior: Appropriate  Motor: Normal  Speech/Language:  Normal Rate  Affect: Appropriate  Mood: normal  Thought process: normal  Thought content:   WNL  Sensory/Perceptual disturbances:   WNL  Orientation: oriented to person, place, time/date, and situation  Attention: Good  Concentration: Good  Memory: WNL  Fund of knowledge:  Good  Insight:   Good  Judgment:  Good  Impulse Control: Good   Risk Assessment: Danger to Self:  No Self-injurious Behavior: No Danger to Others: No Duty to Warn:no Physical Aggression / Violence:No  Access to Firearms a concern: No  Gang Involvement:No   Subjective: Pt present for face-to-face individual therapy via video Webex.  Pt consents to telehealth video session due to COVID 19 pandemic. Location of pt: home Location of therapist: home office.  Pt talked about having an OT information meeting and she felt anxious bc she had to participate. Pt talked about feeling lonely at times.  She is trying to keep busy with exercise, working on her OT graduate school applications, and seeing friends.   Pt continues to have classes as well and is doing well.   Pt had an interview last week for a job at [employer].  She is hoping to get a second interview.    Pt talked about her mood.   She has had some moments where she has felt down but she works to not "go down the rabbit hole" with negative thoughts.   Worked on thought reframing.   Worked on self care strategies.   Worked on positive affirmations.   Encouraged pt to journal.  Provided supportive therapy.   Interventions: Cognitive Behavioral Therapy and Insight-Oriented  Diagnosis: F33.1  Plan:  Plan to continue  to meet every two weeks.   Treatment Plan  (treatment plan target date: 06/28/2022). Client Abilities/Strengths  Pt is bright and motivated for therapy. Client Treatment Preferences  Individual therapy.  Client Statement of Needs  Improve coping skills.  Symptoms  Depressed or irritable mood.  Diminished interest in or enjoyment of activities.  Problems Addressed  Unipolar Depression Goals 1. Alleviate depressive symptoms and return to previous level of effective functioning. 2. Appropriately grieve the loss in order to normalize mood and to return to previously adaptive level of functioning. Objective Learn and implement behavioral strategies to overcome depression. Target Date: 2022-06-28 Frequency: Biweekly  Progress: 10 Modality: individual  Related Interventions Engage the client in "behavioral activation," increasing his/her activity level and contact with sources of reward, while identifying processes that inhibit activation.  Use behavioral techniques such as instruction, rehearsal, role-playing, role reversal, as needed, to facilitate activity in the client's daily life; reinforce success. Assist the client in developing skills that increase the likelihood of deriving pleasure from behavioral activation (e.g., assertiveness skills, developing an exercise plan, less internal/more external focus, increased social involvement); reinforce success. Objective Identify important people in life, past and present, and describe the quality, good and poor, of those relationships. Target Date: 2022-06-28 Frequency: Biweekly  Progress: 10 Modality: individual  Related Interventions Conduct Interpersonal Therapy beginning with the assessment of the client's "interpersonal inventory" of important past and present relationships; develop a case formulation linking depression to grief, interpersonal role disputes, role transitions,  and/or interpersonal deficits). Objective Learn and implement  problem-solving and decision-making skills. Target Date: 2022-06-28 Frequency: Biweekly  Progress: 10 Modality: individual  Related Interventions Conduct Problem-Solving Therapy using techniques such as psychoeducation, modeling, and role-playing to teach client problem-solving skills (i.e., defining a problem specifically, generating possible solutions, evaluating the pros and cons of each solution, selecting and implementing a plan of action, evaluating the efficacy of the plan, accepting or revising the plan); role-play application of the problem-solving skill to a real life issue. Encourage in the client the development of a positive problem orientation in which problems and solving them are viewed as a natural part of life and not something to be feared, despaired, or avoided. 3. Develop healthy interpersonal relationships that lead to the alleviation and help prevent the relapse of depression. 4. Develop healthy thinking patterns and beliefs about self, others, and the world that lead to the alleviation and help prevent the relapse of depression. 5. Recognize, accept, and cope with feelings of depression. Diagnosis F33.1  Conditions For Discharge Achievement of treatment goals and objectives   Clint Bolder, LCSW

## 2022-04-09 ENCOUNTER — Other Ambulatory Visit: Payer: Self-pay | Admitting: Internal Medicine

## 2022-04-10 ENCOUNTER — Ambulatory Visit: Payer: BC Managed Care – PPO | Admitting: Psychology

## 2022-04-10 ENCOUNTER — Ambulatory Visit (INDEPENDENT_AMBULATORY_CARE_PROVIDER_SITE_OTHER): Payer: BC Managed Care – PPO | Admitting: Psychology

## 2022-04-10 DIAGNOSIS — F331 Major depressive disorder, recurrent, moderate: Secondary | ICD-10-CM

## 2022-04-10 NOTE — Progress Notes (Signed)
[facility] Counselor/Therapist Progress Note  Patient ID: Kendra Allen, MRN: [id number],    Date: 04/10/2022  Time Spent: 3:00pm-3:45pm    45 minutes   Treatment Type: Individual Therapy  Reported Symptoms:  stress  Mental Status Exam: Appearance:  Casual     Behavior: Appropriate  Motor: Normal  Speech/Language:  Normal Rate  Affect: Appropriate  Mood: normal  Thought process: normal  Thought content:   WNL  Sensory/Perceptual disturbances:   WNL  Orientation: oriented to person, place, time/date, and situation  Attention: Good  Concentration: Good  Memory: WNL  Fund of knowledge:  Good  Insight:   Good  Judgment:  Good  Impulse Control: Good   Risk Assessment: Danger to Self:  No Self-injurious Behavior: No Danger to Others: No Duty to Warn:no Physical Aggression / Violence:No  Access to Firearms a concern: No  Gang Involvement:No   Subjective: Pt present for face-to-face individual therapy via video Webex.  Pt consents to telehealth video session due to COVID 19 pandemic. Location of pt: home Location of therapist: home office.  Pt talked about interviewing for the job at [employer].  She has had 2 interviews and is hoping to get the position.  The position is 20 hours a week.  Pt is anxious about the unknown of the job since she has not worked in over a year.   Addressed pt's anxiety and worked on [employer].   Worked on thought reframing.   Pt talked about school.  She is doing well.  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 Worked on self care strategies.   Worked on positive affirmations.   Encouraged pt to journal.  Provided supportive therapy.   Interventions: Cognitive Behavioral Therapy and Insight-Oriented  Diagnosis: F33.1  Plan:  Plan to continue to meet every two weeks.   Treatment Plan  (treatment plan target  date: 06/28/2022). Client Abilities/Strengths  Pt is bright and motivated for therapy. Client Treatment Preferences  Individual therapy.  Client Statement of Needs  Improve coping skills.  Symptoms  Depressed or irritable mood.  Diminished interest in or enjoyment of activities.  Problems Addressed  Unipolar Depression Goals 1. Alleviate depressive symptoms and return to previous level of effective functioning. 2. Appropriately grieve the loss in order to normalize mood and to return to previously adaptive level of functioning. Objective Learn and implement behavioral strategies to overcome depression. Target Date: 2022-06-28 Frequency: Biweekly  Progress: 10 Modality: individual  Related Interventions Engage the client in "behavioral activation," increasing his/her activity level and contact with sources of reward, while identifying processes that inhibit activation.  Use behavioral techniques such as instruction, rehearsal, role-playing, role reversal, as needed, to facilitate activity in the client's daily life; reinforce success. Assist the client in developing skills that increase the likelihood of deriving pleasure from behavioral activation (e.g., assertiveness skills, developing an exercise plan, less internal/more external focus, increased social involvement); reinforce success. Objective Identify important people in life, past and present, and describe the quality, good and poor, of those relationships. Target Date: 2022-06-28 Frequency: Biweekly  Progress: 10 Modality: individual  Related Interventions Conduct Interpersonal Therapy beginning with the assessment of the client's "interpersonal inventory" of important past and present relationships; develop a case formulation linking depression to grief, interpersonal role disputes, role transitions, and/or interpersonal deficits). Objective Learn and implement problem-solving and decision-making skills. Target Date: 2022-06-28  Frequency: Biweekly  Progress: 10 Modality: individual  Related Interventions Conduct Problem-Solving Therapy using techniques such as psychoeducation, modeling, and role-playing to teach client problem-solving skills (i.e., defining a problem specifically, generating possible solutions, evaluating the pros and cons of each solution, selecting and implementing a plan of action, evaluating the efficacy of the plan, accepting or revising the plan); role-play application of the problem-solving skill to a real life issue. Encourage in the client the development of a positive problem orientation in which problems and solving them are viewed as a natural part of life and not something to be feared, despaired, or avoided. 3. Develop healthy interpersonal relationships that lead to the alleviation and help prevent the relapse of depression. 4. Develop healthy thinking patterns and beliefs about self, others, and the world that lead to the alleviation and help prevent the relapse of depression. 5. Recognize, accept, and cope with feelings of depression. Diagnosis F33.1  Conditions For Discharge Achievement of treatment goals and objectives   Clint Bolder, LCSW

## 2022-04-11 ENCOUNTER — Ambulatory Visit: Payer: BC Managed Care – PPO | Admitting: Psychology

## 2022-04-24 ENCOUNTER — Ambulatory Visit: Payer: BC Managed Care – PPO | Admitting: Psychology

## 2022-04-25 ENCOUNTER — Ambulatory Visit (INDEPENDENT_AMBULATORY_CARE_PROVIDER_SITE_OTHER): Payer: BC Managed Care – PPO | Admitting: Psychology

## 2022-04-25 DIAGNOSIS — F331 Major depressive disorder, recurrent, moderate: Secondary | ICD-10-CM

## 2022-04-25 NOTE — Progress Notes (Signed)
[facility] Counselor/Therapist Progress Note  Patient ID: GENOWEFA MORGA, MRN: [id number],    Date: 04/25/2022  Time Spent: 2:00pm-2:45pm    45 minutes   Treatment Type: Individual Therapy  Reported Symptoms:  stress  Mental Status Exam: Appearance:  Casual     Behavior: Appropriate  Motor: Normal  Speech/Language:  Normal Rate  Affect: Appropriate  Mood: normal  Thought process: normal  Thought content:   WNL  Sensory/Perceptual disturbances:   WNL  Orientation: oriented to person, place, time/date, and situation  Attention: Good  Concentration: Good  Memory: WNL  Fund of knowledge:  Good  Insight:   Good  Judgment:  Good  Impulse Control: Good   Risk Assessment: Danger to Self:  No Self-injurious Behavior: No Danger to Others: No Duty to Warn:no Physical Aggression / Violence:No  Access to Firearms a concern: No  Gang Involvement:No   Subjective: Pt present for face-to-face individual therapy via video Webex.  Pt consents to telehealth video session due to COVID 19 pandemic. Location of pt: home Location of therapist: home office.  Pt talked about the job she interviewed for at [employer].  Pt got the job but it has changed to only 6 hours a week.   Pt accepted the job and is applying for a full time job there.   Pt has felt depressed the past week.  Pt saw her psychiatrist who increased her medication last week.   Pt has been isolating, staying in bed and has stopped going to the gym.  Worked with pt on behavioral activation strategies.   She set the intention to start to go to the gym 3 days a week starting tomorrow.  Pt will also start to work on doing Christmas crafts, which she enjoys.    Worked on self care strategies.   Worked on positive affirmations.   Encouraged pt to journal.  Provided supportive therapy.   Interventions: Cognitive Behavioral Therapy and Insight-Oriented  Diagnosis: F33.1  Plan:  Plan to continue to meet every two  weeks.   Treatment Plan  (treatment plan target date: 06/28/2022). Client Abilities/Strengths  Pt is bright and motivated for therapy. Client Treatment Preferences  Individual therapy.  Client Statement of Needs  Improve coping skills.  Symptoms  Depressed or irritable mood.  Diminished interest in or enjoyment of activities.  Problems Addressed  Unipolar Depression Goals 1. Alleviate depressive symptoms and return to previous level of effective functioning. 2. Appropriately grieve the loss in order to normalize mood and to return to previously adaptive level of functioning. Objective Learn and implement behavioral strategies to overcome depression. Target Date: 2022-06-28 Frequency: Biweekly  Progress: 10 Modality: individual  Related Interventions Engage the client in "behavioral activation," increasing his/her activity level and contact with sources of reward, while identifying processes that inhibit activation.  Use behavioral techniques such as instruction, rehearsal, role-playing, role reversal, as needed, to facilitate activity in the client's daily life; reinforce success. Assist the client in developing skills that increase the likelihood of deriving pleasure from behavioral activation (e.g., assertiveness skills, developing an exercise plan, less internal/more external focus, increased social involvement); reinforce success. Objective Identify important people in life, past and present, and describe the quality, good and poor, of those relationships. Target Date: 2022-06-28 Frequency: Biweekly  Progress: 10 Modality: individual  Related Interventions Conduct Interpersonal Therapy beginning with the assessment of the client's "interpersonal inventory" of important past and present relationships; develop a case formulation linking depression to grief, interpersonal role disputes, role  transitions, and/or interpersonal deficits). Objective Learn and implement problem-solving and  decision-making skills. Target Date: 2022-06-28 Frequency: Biweekly  Progress: 10 Modality: individual  Related Interventions Conduct Problem-Solving Therapy using techniques such as psychoeducation, modeling, and role-playing to teach client problem-solving skills (i.e., defining a problem specifically, generating possible solutions, evaluating the pros and cons of each solution, selecting and implementing a plan of action, evaluating the efficacy of the plan, accepting or revising the plan); role-play application of the problem-solving skill to a real life issue. Encourage in the client the development of a positive problem orientation in which problems and solving them are viewed as a natural part of life and not something to be feared, despaired, or avoided. 3. Develop healthy interpersonal relationships that lead to the alleviation and help prevent the relapse of depression. 4. Develop healthy thinking patterns and beliefs about self, others, and the world that lead to the alleviation and help prevent the relapse of depression. 5. Recognize, accept, and cope with feelings of depression. Diagnosis F33.1  Conditions For Discharge Achievement of treatment goals and objectives   Clint Bolder, LCSW

## 2022-05-05 ENCOUNTER — Other Ambulatory Visit: Payer: Self-pay | Admitting: Internal Medicine

## 2022-05-09 ENCOUNTER — Ambulatory Visit (INDEPENDENT_AMBULATORY_CARE_PROVIDER_SITE_OTHER): Payer: BC Managed Care – PPO | Admitting: Psychology

## 2022-05-09 DIAGNOSIS — F331 Major depressive disorder, recurrent, moderate: Secondary | ICD-10-CM | POA: Diagnosis not present

## 2022-05-09 NOTE — Progress Notes (Signed)
[facility] Counselor/Therapist Progress Note  Patient ID: NAKOMA GOTWALT, MRN: [id number],    Date: 05/09/2022  Time Spent: 2:00pm-2:45pm    45 minutes   Treatment Type: Individual Therapy  Reported Symptoms:  stress  Mental Status Exam: Appearance:  Casual     Behavior: Appropriate  Motor: Normal  Speech/Language:  Normal Rate  Affect: Appropriate  Mood: normal  Thought process: normal  Thought content:   WNL  Sensory/Perceptual disturbances:   WNL  Orientation: oriented to person, place, time/date, and situation  Attention: Good  Concentration: Good  Memory: WNL  Fund of knowledge:  Good  Insight:   Good  Judgment:  Good  Impulse Control: Good   Risk Assessment: Danger to Self:  No Self-injurious Behavior: No Danger to Others: No Duty to Warn:no Physical Aggression / Violence:No  Access to Firearms a concern: No  Gang Involvement:No   Subjective: Pt present for face-to-face individual therapy via video Webex.  Pt consents to telehealth video session due to COVID 19 pandemic. Location of pt: home Location of therapist: home office.  Pt talked about the job she has gotten at [employer].   She starts her new part time job this week.   She feels some anticipatory anxiety about the job.   Worked on calming strategies and thought reframing.   Pt has been intentional about walking as exercise a few times a week.  She is also connecting with friends.   Pt talked about the holidays.   She is going to be with extended family of about 30 people.   Pt dreads it bc of being questioned about what she is doing regarding occupation.  The 30 people can be overwhelming for pt.    Worked on self care strategies.   Worked on positive affirmations.   Pt feels good that she is making steps in the right direction toward her goals.   Her mood has been stable.   Encouraged pt to journal.  Provided supportive therapy.   Interventions: Cognitive Behavioral Therapy and  Insight-Oriented  Diagnosis: F33.1  Plan:  Plan to continue to meet every two weeks.   Treatment Plan  (treatment plan target date: 06/28/2022). Client Abilities/Strengths  Pt is bright and motivated for therapy. Client Treatment Preferences  Individual therapy.  Client Statement of Needs  Improve coping skills.  Symptoms  Depressed or irritable mood.  Diminished interest in or enjoyment of activities.  Problems Addressed  Unipolar Depression Goals 1. Alleviate depressive symptoms and return to previous level of effective functioning. 2. Appropriately grieve the loss in order to normalize mood and to return to previously adaptive level of functioning. Objective Learn and implement behavioral strategies to overcome depression. Target Date: 2022-06-28 Frequency: Biweekly  Progress: 10 Modality: individual  Related Interventions Engage the client in "behavioral activation," increasing his/her activity level and contact with sources of reward, while identifying processes that inhibit activation.  Use behavioral techniques such as instruction, rehearsal, role-playing, role reversal, as needed, to facilitate activity in the client's daily life; reinforce success. Assist the client in developing skills that increase the likelihood of deriving pleasure from behavioral activation (e.g., assertiveness skills, developing an exercise plan, less internal/more external focus, increased social involvement); reinforce success. Objective Identify important people in life, past and present, and describe the quality, good and poor, of those relationships. Target Date: 2022-06-28 Frequency: Biweekly  Progress: 10 Modality: individual  Related Interventions Conduct Interpersonal Therapy beginning with the assessment of the client's "interpersonal inventory" of important past  and present relationships; develop a case formulation linking depression to grief, interpersonal role disputes, role transitions,  and/or interpersonal deficits). Objective Learn and implement problem-solving and decision-making skills. Target Date: 2022-06-28 Frequency: Biweekly  Progress: 10 Modality: individual  Related Interventions Conduct Problem-Solving Therapy using techniques such as psychoeducation, modeling, and role-playing to teach client problem-solving skills (i.e., defining a problem specifically, generating possible solutions, evaluating the pros and cons of each solution, selecting and implementing a plan of action, evaluating the efficacy of the plan, accepting or revising the plan); role-play application of the problem-solving skill to a real life issue. Encourage in the client the development of a positive problem orientation in which problems and solving them are viewed as a natural part of life and not something to be feared, despaired, or avoided. 3. Develop healthy interpersonal relationships that lead to the alleviation and help prevent the relapse of depression. 4. Develop healthy thinking patterns and beliefs about self, others, and the world that lead to the alleviation and help prevent the relapse of depression. 5. Recognize, accept, and cope with feelings of depression. Diagnosis F33.1  Conditions For Discharge Achievement of treatment goals and objectives   Clint Bolder, LCSW

## 2022-05-22 ENCOUNTER — Ambulatory Visit (INDEPENDENT_AMBULATORY_CARE_PROVIDER_SITE_OTHER): Payer: BC Managed Care – PPO | Admitting: Psychology

## 2022-05-22 DIAGNOSIS — F331 Major depressive disorder, recurrent, moderate: Secondary | ICD-10-CM | POA: Diagnosis not present

## 2022-05-22 NOTE — Progress Notes (Signed)
[facility] Counselor/Therapist Progress Note  Patient ID: Kendra Allen, MRN: [id number],    Date: 05/22/2022  Time Spent: 5:00pm-5:45pm    45 minutes   Treatment Type: Individual Therapy  Reported Symptoms:  stress  Mental Status Exam: Appearance:  Casual     Behavior: Appropriate  Motor: Normal  Speech/Language:  Normal Rate  Affect: Appropriate  Mood: normal  Thought process: normal  Thought content:   WNL  Sensory/Perceptual disturbances:   WNL  Orientation: oriented to person, place, time/date, and situation  Attention: Good  Concentration: Good  Memory: WNL  Fund of knowledge:  Good  Insight:   Good  Judgment:  Good  Impulse Control: Good   Risk Assessment: Danger to Self:  No Self-injurious Behavior: No Danger to Others: No Duty to Warn:no Physical Aggression / Violence:No  Access to Firearms a concern: No  Gang Involvement:No   Subjective: Pt present for face-to-face individual therapy via video Webex.  Pt consents to telehealth video session due to COVID 19 pandemic. Location of pt: home Location of therapist: home office.  Pt talked about  the Thanksgiving holiday.  Things went well. Pt talked about work.   She started her new job last week and it is going well so far. 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  She feels relieved to have that complete.   Pt states her mood has been good bc she has been busy and has not been focusing on negative thoughts.   Pt feels like she is accomplishing things.   Pt states she feels proud of herself for the action steps she has taken and the things she has been accomplishing. At times she still has negative thoughts regarding anxiety about her new job.  She had some anxiety about the unknown.  Worked on self care strategies.   Worked on positive affirmations.   Encouraged pt to journal.  Provided supportive therapy.   Interventions: Cognitive Behavioral Therapy and  Insight-Oriented  Diagnosis: F33.1  Plan:  Plan to continue to meet every two weeks.   Treatment Plan  (treatment plan target date: 06/28/2022). Client Abilities/Strengths  Pt is bright and motivated for therapy. Client Treatment Preferences  Individual therapy.  Client Statement of Needs  Improve coping skills.  Symptoms  Depressed or irritable mood.  Diminished interest in or enjoyment of activities.  Problems Addressed  Unipolar Depression Goals 1. Alleviate depressive symptoms and return to previous level of effective functioning. 2. Appropriately grieve the loss in order to normalize mood and to return to previously adaptive level of functioning. Objective Learn and implement behavioral strategies to overcome depression. Target Date: 2022-06-28 Frequency: Biweekly  Progress: 10 Modality: individual  Related Interventions Engage the client in "behavioral activation," increasing his/her activity level and contact with sources of reward, while identifying processes that inhibit activation.  Use behavioral techniques such as instruction, rehearsal, role-playing, role reversal, as needed, to facilitate activity in the client's daily life; reinforce success. Assist the client in developing skills that increase the likelihood of deriving pleasure from behavioral activation (e.g., assertiveness skills, developing an exercise plan, less internal/more external focus, increased social involvement); reinforce success. Objective Identify important people in life, past and present, and describe the quality, good and poor, of those relationships. Target Date: 2022-06-28 Frequency: Biweekly  Progress: 10 Modality: individual  Related Interventions Conduct Interpersonal Therapy beginning with the assessment of the client's "interpersonal inventory" of important past and present relationships; develop a case formulation linking depression to grief, interpersonal role  disputes, role transitions,  and/or interpersonal deficits). Objective Learn and implement problem-solving and decision-making skills. Target Date: 2022-06-28 Frequency: Biweekly  Progress: 10 Modality: individual  Related Interventions Conduct Problem-Solving Therapy using techniques such as psychoeducation, modeling, and role-playing to teach client problem-solving skills (i.e., defining a problem specifically, generating possible solutions, evaluating the pros and cons of each solution, selecting and implementing a plan of action, evaluating the efficacy of the plan, accepting or revising the plan); role-play application of the problem-solving skill to a real life issue. Encourage in the client the development of a positive problem orientation in which problems and solving them are viewed as a natural part of life and not something to be feared, despaired, or avoided. 3. Develop healthy interpersonal relationships that lead to the alleviation and help prevent the relapse of depression. 4. Develop healthy thinking patterns and beliefs about self, others, and the world that lead to the alleviation and help prevent the relapse of depression. 5. Recognize, accept, and cope with feelings of depression. Diagnosis F33.1  Conditions For Discharge Achievement of treatment goals and objectives   Kendra Bolder, LCSW

## 2022-05-23 ENCOUNTER — Ambulatory Visit: Payer: BC Managed Care – PPO | Admitting: Psychology

## 2022-06-01 ENCOUNTER — Ambulatory Visit: Payer: BC Managed Care – PPO | Admitting: Obstetrics and Gynecology

## 2022-06-05 ENCOUNTER — Ambulatory Visit: Payer: BC Managed Care – PPO | Admitting: Psychology

## 2022-06-08 ENCOUNTER — Ambulatory Visit (INDEPENDENT_AMBULATORY_CARE_PROVIDER_SITE_OTHER): Payer: BC Managed Care – PPO | Admitting: Psychology

## 2022-06-08 DIAGNOSIS — F331 Major depressive disorder, recurrent, moderate: Secondary | ICD-10-CM

## 2022-06-08 NOTE — Progress Notes (Signed)
[facility] Counselor/Therapist Progress Note  Patient ID: BOBIE CARIS, MRN: [id number],    Date: 06/08/2022  Time Spent: 5:15pm-6:00pm    45 minutes   Treatment Type: Individual Therapy  Reported Symptoms:  stress  Mental Status Exam: Appearance:  Casual     Behavior: Appropriate  Motor: Normal  Speech/Language:  Normal Rate  Affect: Appropriate  Mood: normal  Thought process: normal  Thought content:   WNL  Sensory/Perceptual disturbances:   WNL  Orientation: oriented to person, place, time/date, and situation  Attention: Good  Concentration: Good  Memory: WNL  Fund of knowledge:  Good  Insight:   Good  Judgment:  Good  Impulse Control: Good   Risk Assessment: Danger to Self:  No Self-injurious Behavior: No Danger to Others: No Duty to Warn:no Physical Aggression / Violence:No  Access to Firearms a concern: No  Gang Involvement:No   Subjective: Pt present for face-to-face individual therapy via video Webex.  Pt consents to telehealth video session due to COVID 19 pandemic. Location of pt: home Location of therapist: home office.  Pt talked about work.   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   After training she start working 2 days a week.   Pt states she has been doing well overall.  Pt states she has not felt down.   She is adjusting to a new schedule and trying to figure out what to do with her free time.  Helped pt brainstorm. Pt states she is nervous about the OT application bc she had an interview and was very nervous and is worried it showed.  Addressed pt's worries.   Worked on self care strategies.   Worked on positive affirmations.   Provided supportive therapy.   Interventions: Cognitive Behavioral Therapy and Insight-Oriented  Diagnosis: F33.1  Plan:  Plan to continue to meet every two weeks.   Treatment Plan  (treatment plan target date: 06/28/2022). Client Abilities/Strengths  Pt is bright and motivated for therapy. Client  Treatment Preferences  Individual therapy.  Client Statement of Needs  Improve coping skills.  Symptoms  Depressed or irritable mood.  Diminished interest in or enjoyment of activities.  Problems Addressed  Unipolar Depression Goals 1. Alleviate depressive symptoms and return to previous level of effective functioning. 2. Appropriately grieve the loss in order to normalize mood and to return to previously adaptive level of functioning. Objective Learn and implement behavioral strategies to overcome depression. Target Date: 2022-06-28 Frequency: Biweekly  Progress: 10 Modality: individual  Related Interventions Engage the client in "behavioral activation," increasing his/her activity level and contact with sources of reward, while identifying processes that inhibit activation.  Use behavioral techniques such as instruction, rehearsal, role-playing, role reversal, as needed, to facilitate activity in the client's daily life; reinforce success. Assist the client in developing skills that increase the likelihood of deriving pleasure from behavioral activation (e.g., assertiveness skills, developing an exercise plan, less internal/more external focus, increased social involvement); reinforce success. Objective Identify important people in life, past and present, and describe the quality, good and poor, of those relationships. Target Date: 2022-06-28 Frequency: Biweekly  Progress: 10 Modality: individual  Related Interventions Conduct Interpersonal Therapy beginning with the assessment of the client's "interpersonal inventory" of important past and present relationships; develop a case formulation linking depression to grief, interpersonal role disputes, role transitions, and/or interpersonal deficits). Objective Learn and implement problem-solving and decision-making skills. Target Date: 2022-06-28 Frequency: Biweekly  Progress: 10 Modality: individual  Related Interventions Conduct  Problem-Solving Therapy  using techniques such as psychoeducation, modeling, and role-playing to teach client problem-solving skills (i.e., defining a problem specifically, generating possible solutions, evaluating the pros and cons of each solution, selecting and implementing a plan of action, evaluating the efficacy of the plan, accepting or revising the plan); role-play application of the problem-solving skill to a real life issue. Encourage in the client the development of a positive problem orientation in which problems and solving them are viewed as a natural part of life and not something to be feared, despaired, or avoided. 3. Develop healthy interpersonal relationships that lead to the alleviation and help prevent the relapse of depression. 4. Develop healthy thinking patterns and beliefs about self, others, and the world that lead to the alleviation and help prevent the relapse of depression. 5. Recognize, accept, and cope with feelings of depression. Diagnosis F33.1  Conditions For Discharge Achievement of treatment goals and objectives   Clint Bolder, LCSW

## 2022-06-24 IMAGING — MR MR PELVIS WO/W CM
11 series · 44 of 48 positions shown · IV contrast (10 ML GADAVIST)
Comparison: None.

CLINICAL DATA: Right adnexal mass on recent ultrasound.
Postmenopausal female.

EXAM:
MRI PELVIS WITHOUT AND WITH CONTRAST
TECHNIQUE: Multiplanar multisequence MR imaging of the pelvis was performed
both before and after administration of intravenous contrast.
CONTRAST:  10mL GADAVIST GADOBUTROL 1 MMOL/ML IV SOLN

[Series 3: T2 · axial · 5.0mm · 0.51mm/px · z∈[-121,+77]mm · 3 of 34 slices shown]
[im 1/34]
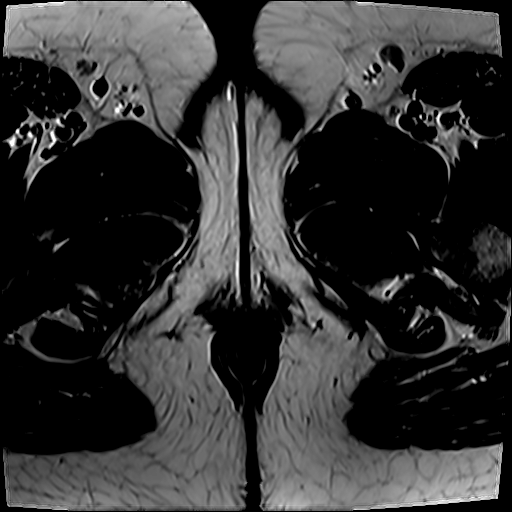
[im 17/34]
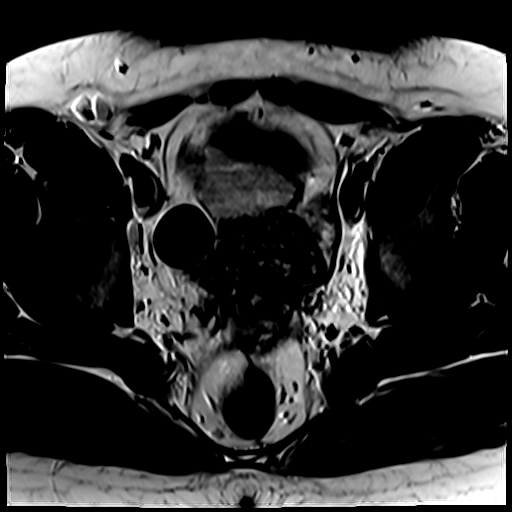
[im 34/34]
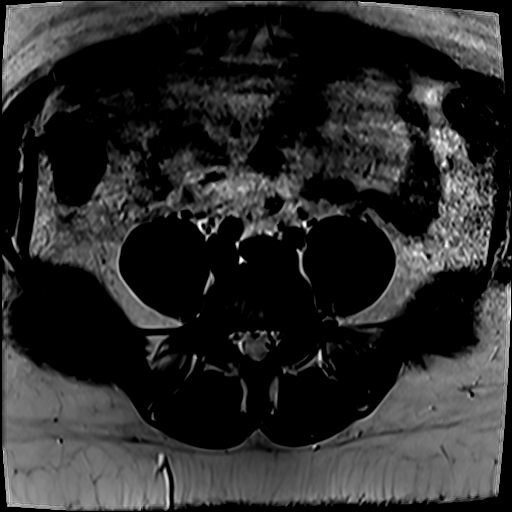

[Series 5: T2 fat-sat · axial · 5.0mm · 0.51mm/px · z∈[-121,+77]mm · 2 of 34 slices shown]
[im 1/34]
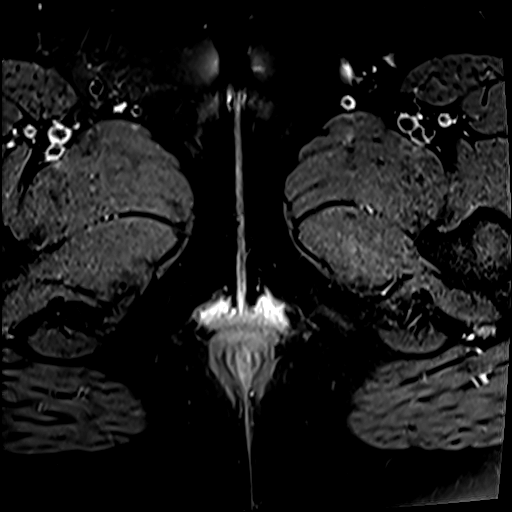
[im 34/34]
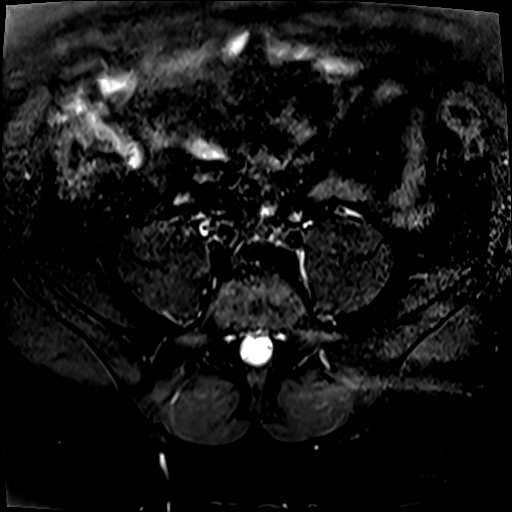

[Series 8: T1 · axial · 4.0mm · 0.84mm/px · z∈[-150,+134]mm · 4 of 72 slices shown]
[im 1/72]
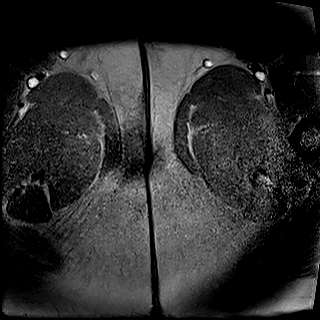
[im 24/72]
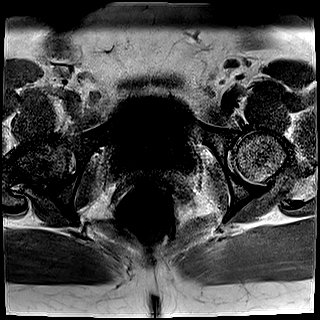
[im 48/72]
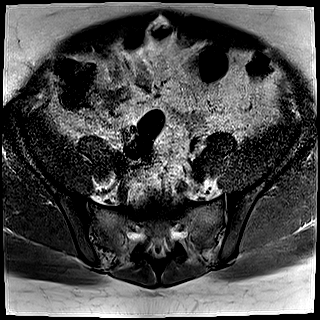
[im 72/72]
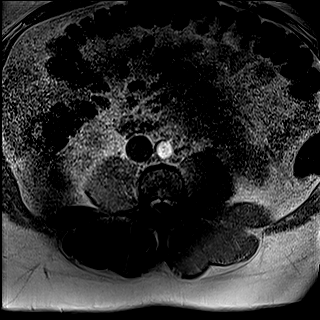

[Series 9: DWI · axial · 5.0mm · 2.80mm/px · z∈[-126,+69]mm · 7 of 116 slices shown]
[im 1/116]
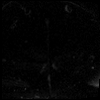
[im 20/116]
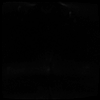
[im 39/116]
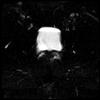
[im 58/116]
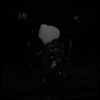
[im 77/116]
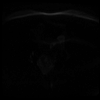
[im 96/116]
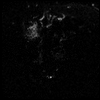
[im 116/116]
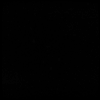

[Series 19: T1 dynamic · axial · 3.0mm · 0.84mm/px · z∈[-151,+86]mm · 5 of 80 slices shown (1 of 3)]
[im 1/80]
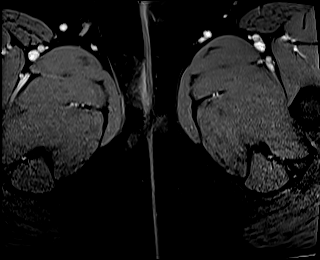
[im 20/80]
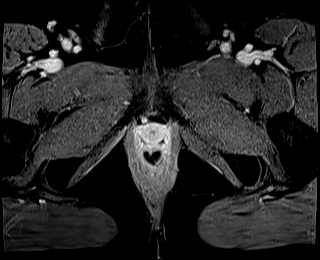
[im 40/80]
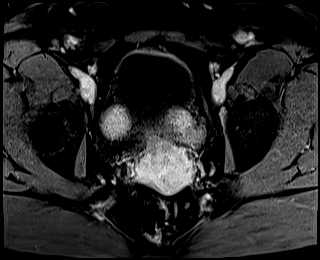
[im 60/80]
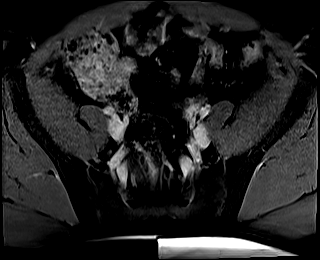
[im 80/80]
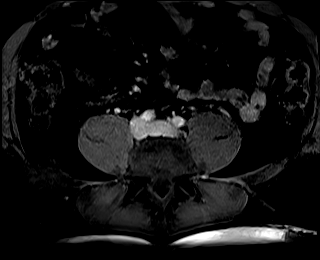

[Series 20: T1 dynamic · axial · 3.0mm · 0.84mm/px · z∈[-151,+86]mm · 4 of 72 slices shown (2 of 3)]
[im 1/72]
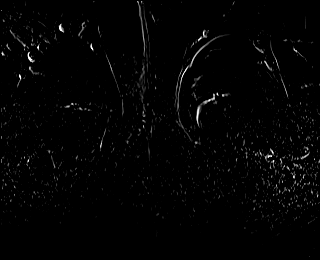
[im 24/72]
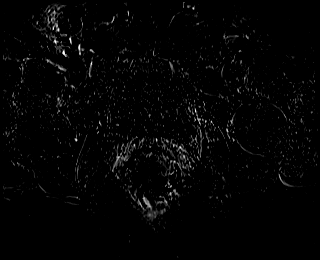
[im 48/72]
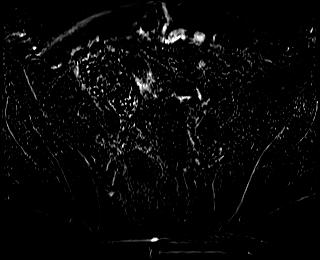
[im 72/72]
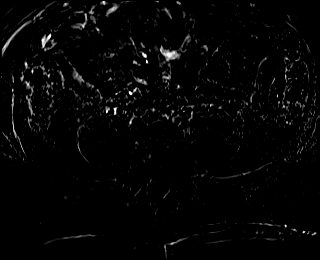

[Series 23: T1 dynamic · axial · 3.0mm · 0.84mm/px · z∈[-151,+86]mm · 5 of 80 slices shown (3 of 3)]
[im 1/80]
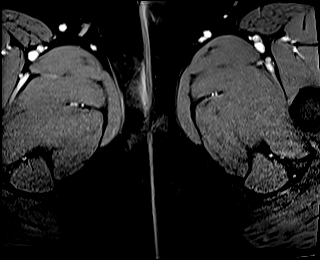
[im 20/80]
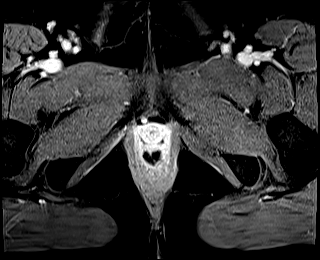
[im 40/80]
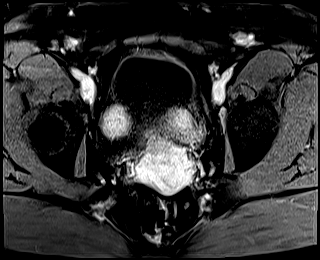
[im 60/80]
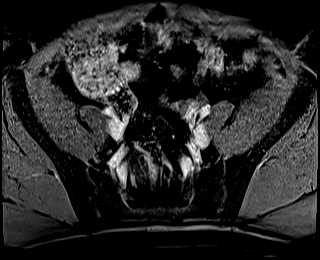
[im 80/80]
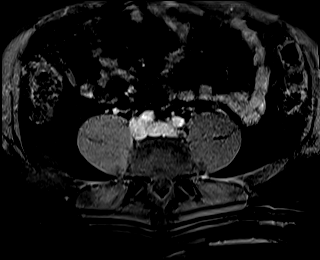

[Series 26: T1 dynamic post-contrast · axial · 3.0mm · 1.06mm/px · z∈[-150,+135]mm · 6 of 96 slices shown (1 of 2)]
[im 1/96]
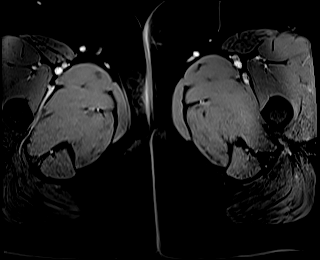
[im 20/96]
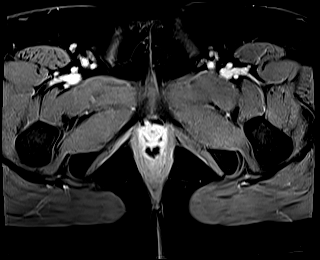
[im 39/96]
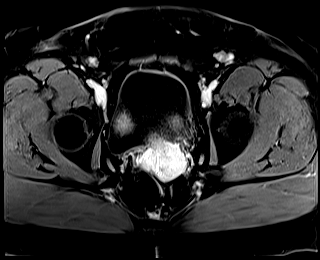
[im 58/96]
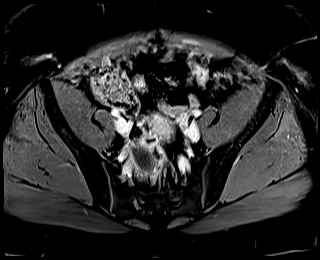
[im 77/96]
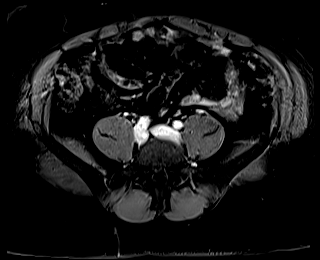
[im 96/96]
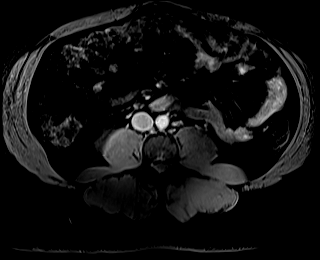

[Series 27: T2 post-contrast · sagittal · 5.0mm · 0.55mm/px · 2 of 40 slices shown]
[im 1/40]
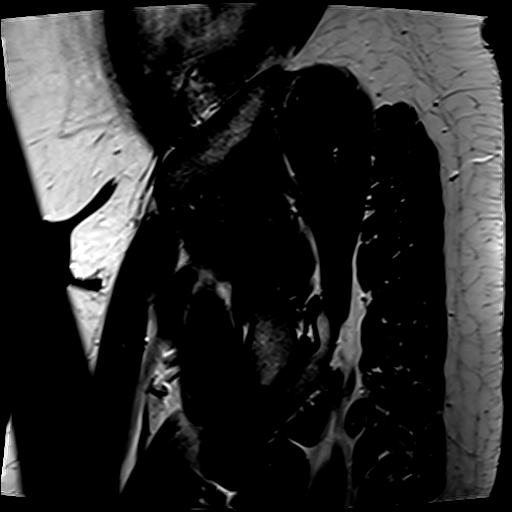
[im 40/40]
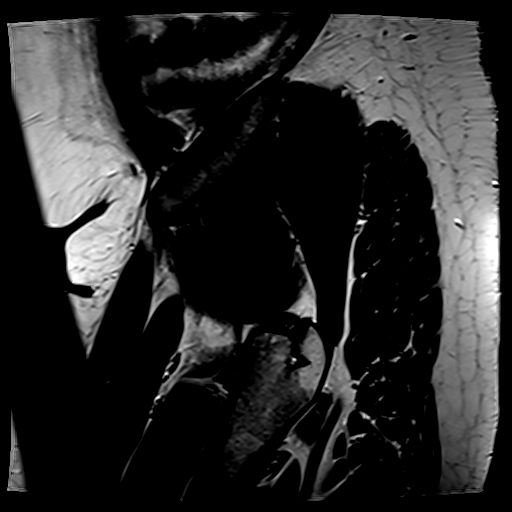

[Series 29: T1 dynamic post-contrast · sagittal · 3.0mm · 0.88mm/px · 5 of 80 slices shown (2 of 2)]
[im 1/80]
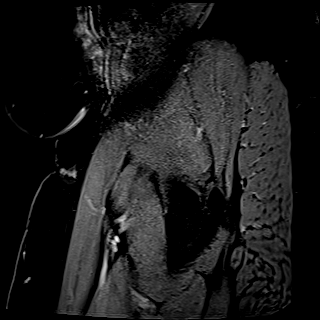
[im 20/80]
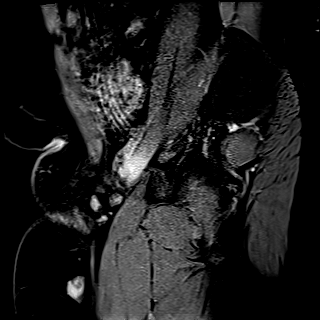
[im 40/80]
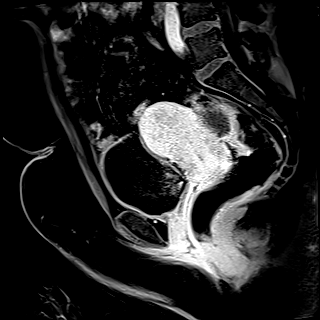
[im 60/80]
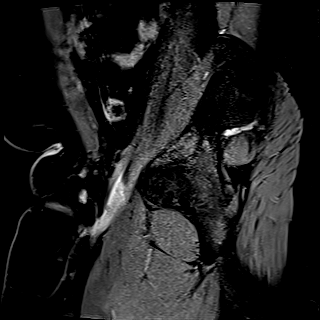
[im 80/80]
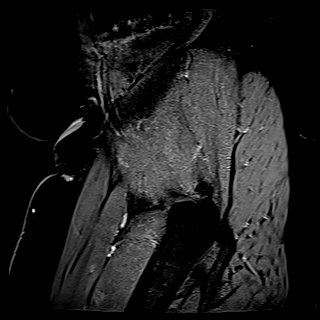

[Series 100: sub 1 min · axial · 3.0mm · 0.84mm/px · 1 of 80 slices shown]
[im 1/80]
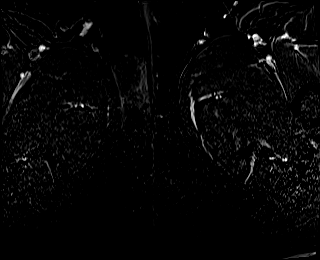

[44 of 48 positions shown; findings below may reference images not displayed]

FINDINGS: Lower Urinary Tract: No urinary bladder or urethral abnormality
identified.

Bowel: Unremarkable pelvic bowel loops.

Vascular/Lymphatic: Unremarkable. No pathologically enlarged pelvic
lymph nodes identified.

Reproductive:

-- Uterus: Measures 9.1 x 5.6 by 6.7 cm (volume = 180 cm^3). A
cm intramural fibroid is seen in the right lateral fundus. A 3.2 cm
pedunculated subserosal fibroid is also seen arising from the right
anterior corpus. Heterogeneous myometrial signal intensity with tiny
cystic foci are also seen, consistent with adenomyosis. Thin
endometrial stripe seen measuring 4 mm. Normal appearance of cervix
and vagina.

-- Right ovary: A complex cystic mass is seen in the right adnexa
which measures 5.7 x 4.0 x 3.4 cm. This contains both multiple
thickened internal septations as well as an enhancing mural nodule
measuring approximately 1.9 cm. This is consistent with a cystic
ovarian neoplasm, and malignancy cannot be excluded.

-- Left ovary: Appears normal. No ovarian or adnexal masses
identified.

Other: No peritoneal thickening or abnormal free fluid.

Musculoskeletal:  Unremarkable.
IMPRESSION: 5.7 cm complex cystic and solid mass in right adnexa, consistent
with cystic ovarian neoplasm. Malignancy cannot be excluded.
Surgical evaluation is recommended.

No evidence of pelvic metastatic disease.

Two small uterine fibroids and adenomyosis, as described above.

## 2022-06-26 ENCOUNTER — Ambulatory Visit (INDEPENDENT_AMBULATORY_CARE_PROVIDER_SITE_OTHER): Payer: BC Managed Care – PPO | Admitting: Psychology

## 2022-06-26 DIAGNOSIS — F331 Major depressive disorder, recurrent, moderate: Secondary | ICD-10-CM

## 2022-06-26 NOTE — Progress Notes (Signed)
[facility] Counselor/Therapist Progress Note  Patient ID: JONIECE SMOTHERMAN, MRN: [id number],    Date: 06/26/2022  Time Spent: 12:00pm-12:45pm    45 minutes   Treatment Type: Individual Therapy  Reported Symptoms:  stress  Mental Status Exam: Appearance:  Casual     Behavior: Appropriate  Motor: Normal  Speech/Language:  Normal Rate  Affect: Appropriate  Mood: normal  Thought process: normal  Thought content:   WNL  Sensory/Perceptual disturbances:   WNL  Orientation: oriented to person, place, time/date, and situation  Attention: Good  Concentration: Good  Memory: WNL  Fund of knowledge:  Good  Insight:   Good  Judgment:  Good  Impulse Control: Good   Risk Assessment: Danger to Self:  No Self-injurious Behavior: No Danger to Others: No Duty to Warn:no Physical Aggression / Violence:No  Access to Firearms a concern: No  Gang Involvement:No   Subjective: Pt present for face-to-face individual therapy via video Webex.  Pt consents to telehealth video session due to COVID 19 pandemic. Location of pt: home Location of therapist: home office.  Pt talked about feeling more stressed lately bc there are a lot of changes coming up.   She is having to get new insurance bc she ages out of being on her parents' insurance in February.    Pt is stressed that she does not get enough hours at work.  She likes her job but wants more hours.   Pt talked to her supervisor about needing more hours.    Pt talked about comparing herself to others.   Her friends are living independently and pt is still living in her childhood home with her parents.  Helped pt process her thoughts and feelings.   Worked on thought reframing.   Addressed how pt can add activities and structure in her days so she does not have too much idle time to ruminate.   Pt is planning to read more.  She will also go to the gym.  Identified that she can start baking which is an activity she use to enjoy.  Worked  on self care strategies.   Worked on positive affirmations.   Provided supportive therapy.   Interventions: Cognitive Behavioral Therapy and Insight-Oriented  Diagnosis: F33.1  Plan:  Plan to continue to meet every two weeks.   Treatment Plan  (treatment plan target date: 06/28/2022). Client Abilities/Strengths  Pt is bright and motivated for therapy. Client Treatment Preferences  Individual therapy.  Client Statement of Needs  Improve coping skills.  Symptoms  Depressed or irritable mood.  Diminished interest in or enjoyment of activities.  Problems Addressed  Unipolar Depression Goals 1. Alleviate depressive symptoms and return to previous level of effective functioning. 2. Appropriately grieve the loss in order to normalize mood and to return to previously adaptive level of functioning. Objective Learn and implement behavioral strategies to overcome depression. Target Date: 2022-06-28 Frequency: Biweekly  Progress: 10 Modality: individual  Related Interventions Engage the client in "behavioral activation," increasing his/her activity level and contact with sources of reward, while identifying processes that inhibit activation.  Use behavioral techniques such as instruction, rehearsal, role-playing, role reversal, as needed, to facilitate activity in the client's daily life; reinforce success. Assist the client in developing skills that increase the likelihood of deriving pleasure from behavioral activation (e.g., assertiveness skills, developing an exercise plan, less internal/more external focus, increased social involvement); reinforce success. Objective Identify important people in life, past and present, and describe the quality, good and poor, of  those relationships. Target Date: 2022-06-28 Frequency: Biweekly  Progress: 10 Modality: individual  Related Interventions Conduct Interpersonal Therapy beginning with the assessment of the client's "interpersonal inventory" of  important past and present relationships; develop a case formulation linking depression to grief, interpersonal role disputes, role transitions, and/or interpersonal deficits). Objective Learn and implement problem-solving and decision-making skills. Target Date: 2022-06-28 Frequency: Biweekly  Progress: 10 Modality: individual  Related Interventions Conduct Problem-Solving Therapy using techniques such as psychoeducation, modeling, and role-playing to teach client problem-solving skills (i.e., defining a problem specifically, generating possible solutions, evaluating the pros and cons of each solution, selecting and implementing a plan of action, evaluating the efficacy of the plan, accepting or revising the plan); role-play application of the problem-solving skill to a real life issue. Encourage in the client the development of a positive problem orientation in which problems and solving them are viewed as a natural part of life and not something to be feared, despaired, or avoided. 3. Develop healthy interpersonal relationships that lead to the alleviation and help prevent the relapse of depression. 4. Develop healthy thinking patterns and beliefs about self, others, and the world that lead to the alleviation and help prevent the relapse of depression. 5. Recognize, accept, and cope with feelings of depression. Diagnosis F33.1  Conditions For Discharge Achievement of treatment goals and objectives   Clint Bolder, LCSW

## 2022-06-27 ENCOUNTER — Ambulatory Visit: Payer: BC Managed Care – PPO | Admitting: Psychology

## 2022-06-30 NOTE — Progress Notes (Signed)
27 y.o. G0P0000 Single Black or African American Not Hispanic or Latino female here for annual exam.     In 7/22 she had a robotic assisted laparoscopic right salpingoophorectomy and myomectomy with Dr Berline Lopes. She had stage IV endometriosis. She was put on OCP's then nuvaring after surgery, unable to tolerate it.  She has a h/o endometriosis and is being seen at [facility] and is currently on aygestin 15 mg a day. No vaginal bleeding. No pelvic pain.   She has a h/o pelvic floor tension, s/p PT  She has a 1/2 sister with breast cancer at 56. She has an elevated risk of breast cancer of 20.3%. Negative genetic testing. The Genetic counselor recommended mammograms at 89 and breast MRI's   Not sexually active in years.   H/O vit d def.   No LMP recorded. (Menstrual status: Oral contraceptives).          Sexually active: No.  The current method of family planning is OCP (estrogen/progesterone).    Exercising: No.  The patient does not participate in regular exercise at present. Smoker:  no  Health Maintenance: Pap: 05/19/21 WNL; 11/13/18 negative  History of abnormal Pap:  no MR Breast 08/04/21 Bi-rads 1 neg  BMD:   n/a  Colonoscopy: none  TDaP:  12/04/11 patient aware they it is due.  Gardasil: complete      reports that she has never smoked. She has never used smokeless tobacco. She reports current alcohol use. She reports that she does not use drugs.  Past Medical History:  Diagnosis Date   Anemia    Anxiety    Chest pain    Constipation    Depression    Endometriosis of pelvis    Family history of breast cancer    Family history of colon cancer    Family history of ovarian cancer    Family history of prostate cancer    Hidradenitis suppurativa    PCOS (polycystic ovarian syndrome)    Prediabetes    Sleep apnea    Vitamin D deficiency     Past Surgical History:  Procedure Laterality Date   CERVICAL POLYPECTOMY     DILATATION & CURETTAGE/HYSTEROSCOPY WITH MYOSURE N/A  11/21/2016   Procedure: DILATATION & CURETTAGE/HYSTEROSCOPY WITH MYOSURE;  [name], MD;  Location: [facility];  Service: Gynecology;  [name];   ROBOTIC ASSISTED LAPAROSCOPIC OVARIAN CYSTECTOMY N/A 12/30/2020   Procedure: XI ROBOTIC ASSISTED LAPAROSCOPIC RIGHT SALPINGO-OOPHORECTOMY WITH PELVIC WASHINGS;  [name], MD;  [name];  Service: Gynecology;  [name];   WISDOM TOOTH EXTRACTION      Current Outpatient Medications  Medication Sig Dispense Refill   clindamycin (CLEOCIN T) 1 % external solution Apply 1 application topically as needed (hidradenitis suppurativa flare). 30 mL 1   hydrOXYzine (VISTARIL) 25 MG capsule Take 1 capsule (25 mg total) by mouth 2 (two) times daily as needed for anxiety. 180 capsule 1   norethindrone (AYGESTIN) 5 MG tablet Take 2 tablets by mouth daily.     TRINTELLIX 20 MG TABS tablet Take 20 mg by mouth daily.     VRAYLAR 1.5 MG capsule Take 1.5 mg by mouth.     No current facility-administered medications for this visit.    Family History  Problem Relation Age of Onset   Hypertension Mother    Depression Mother    Obesity Mother    Sleep apnea Father    Obesity Father    Colon cancer Maternal Uncle  23   Diabetes Paternal Aunt    Heart attack Maternal Grandmother    Prostate cancer Maternal Grandfather        dx 67s, metastatic   Stroke Paternal Grandfather    Breast cancer Half-Sister 79   Ovarian cancer Maternal Great-grandmother        dx ?, MGF's mother   Breast cancer Other        multiple of mother's paternal cousins   Ovarian cancer Other        multiple of mother's paternal cousins   Prostate cancer Other        multiple of mother's paternal cousins   Endometrial cancer Neg Hx    Pancreatic cancer Neg Hx     Review of Systems  All other systems reviewed and are negative.   Exam:   BP 110/80   Pulse 88   Ht '5\' 5"'$  (1.651 m)   Wt 288 lb (130.6 kg)   SpO2 100%   BMI 47.93  kg/m   Weight change: '@WEIGHTCHANGE'$ @ Height:   Height: '5\' 5"'$  (165.1 cm)  Ht Readings from Last 3 Encounters:  07/06/22 '5\' 5"'$  (1.651 m)  07/06/21 '5\' 6"'$  (1.676 m)  05/26/21 '5\' 6"'$  (1.676 m)    General appearance: alert, cooperative and appears stated age Head: Normocephalic, without obvious abnormality, atraumatic Neck: no adenopathy, supple, symmetrical, trachea midline and thyroid normal to inspection and palpation Lungs: clear to auscultation bilaterally Cardiovascular: regular rate and rhythm Breasts: normal appearance, no masses or tenderness Abdomen: soft, non-tender; non distended,  no masses,  no organomegaly Extremities: extremities normal, atraumatic, no cyanosis or edema Skin: Skin color, texture, turgor normal. No rashes or lesions Lymph nodes: Cervical, supraclavicular, and axillary nodes normal. No abnormal inguinal nodes palpated Neurologic: Grossly normal   Pelvic: External genitalia:  no lesions              Urethra:  normal appearing urethra with no masses, tenderness or lesions              Bartholins and Skenes: normal                 Vagina: normal appearing vagina with normal color and discharge, no lesions              Cervix: no lesions               Bimanual Exam:  Uterus:   anteverted, mobile, not tender, not appreciably enlarged              Adnexa: no mass, fullness, tenderness               Rectovaginal: Confirms               Anus:  normal sphincter tone, no lesions  Gae Dry, CMA chaperoned for the exam.  1. Well woman exam Discussed calcium and vit D intake No pap this year No risks for STD's  2. Vitamin D deficiency H/O severe deficiency, not on vit d - VITAMIN D 25 Hydroxy (Vit-D Deficiency, Fractures)  3. At high risk for breast cancer Due for a breast MRI next month, will schedule.  Discussed breast self exam  4. Endometriosis Controlled on oral aygestin

## 2022-07-06 ENCOUNTER — Encounter: Payer: Self-pay | Admitting: Obstetrics and Gynecology

## 2022-07-06 ENCOUNTER — Ambulatory Visit (INDEPENDENT_AMBULATORY_CARE_PROVIDER_SITE_OTHER): Payer: BC Managed Care – PPO | Admitting: Obstetrics and Gynecology

## 2022-07-06 VITALS — BP 110/80 | HR 88 | Ht 65.0 in | Wt 288.0 lb

## 2022-07-06 DIAGNOSIS — N809 Endometriosis, unspecified: Secondary | ICD-10-CM

## 2022-07-06 DIAGNOSIS — E559 Vitamin D deficiency, unspecified: Secondary | ICD-10-CM

## 2022-07-06 DIAGNOSIS — Z9189 Other specified personal risk factors, not elsewhere classified: Secondary | ICD-10-CM

## 2022-07-06 DIAGNOSIS — Z01419 Encounter for gynecological examination (general) (routine) without abnormal findings: Secondary | ICD-10-CM

## 2022-07-06 NOTE — Patient Instructions (Signed)

## 2022-07-07 LAB — VITAMIN D 25 HYDROXY (VIT D DEFICIENCY, FRACTURES): Vit D, 25-Hydroxy: 17 ng/mL — ABNORMAL LOW (ref 30–100)

## 2022-07-08 ENCOUNTER — Other Ambulatory Visit: Payer: Self-pay | Admitting: Internal Medicine

## 2022-07-11 ENCOUNTER — Other Ambulatory Visit: Payer: Self-pay

## 2022-07-11 ENCOUNTER — Ambulatory Visit (INDEPENDENT_AMBULATORY_CARE_PROVIDER_SITE_OTHER): Payer: BC Managed Care – PPO | Admitting: Psychology

## 2022-07-11 ENCOUNTER — Ambulatory Visit: Payer: BC Managed Care – PPO | Admitting: Psychology

## 2022-07-11 ENCOUNTER — Telehealth: Payer: Self-pay

## 2022-07-11 DIAGNOSIS — E559 Vitamin D deficiency, unspecified: Secondary | ICD-10-CM

## 2022-07-11 DIAGNOSIS — F331 Major depressive disorder, recurrent, moderate: Secondary | ICD-10-CM

## 2022-07-11 DIAGNOSIS — Z9189 Other specified personal risk factors, not elsewhere classified: Secondary | ICD-10-CM

## 2022-07-11 MED ORDER — VITAMIN D (ERGOCALCIFEROL) 1.25 MG (50000 UNIT) PO CAPS: 50000.0000 [IU] | ORAL_CAPSULE | ORAL | 0 refills | Status: DC

## 2022-07-11 NOTE — Telephone Encounter (Signed)
Kendra Dom, MD  P [facility] Triage Please schedule her for a breast MRI in mid to late 2/24. Increased risk of breast cancer.

## 2022-07-11 NOTE — Telephone Encounter (Signed)
Order placed in Epic.  Left message for patient to call. 

## 2022-07-11 NOTE — Progress Notes (Signed)
[facility] Counselor Initial Adult Exam  Name: Kendra Allen Date: 07/11/2022 MRN: [id number] DOB: [birth year] PCP: Cassandria Anger, MD  Time spent: 3:00pm-3:50pm   50 minutes  Guardian/Payee:  Crista Curb requested: No   Reason for Visit /Presenting Problem:  Pt present for face-to-face initial assessment update via video Webex.  Pt consents to telehealth video session due to COVID 19 pandemic. Location of pt: home Location of therapist: home office.  Pt continues to need therapy to work on coping skills and to deal with symptoms of anxiety and depression.   Pt tends to overthink things and talk herself out of things. Acknowledged the progress pt has made this past year.  Reviewed pt's treatment plan for annual update.   Updated pt's treatment plan and IA.  Pt participated in setting treatment goals.   Plan to meet every two weeks.    Mental Status Exam: Appearance:   Casual     Behavior:  Appropriate  Motor:  Normal  Speech/Language:   Normal Rate  Affect:  Appropriate  Mood:  normal  Thought process:  normal  Thought content:    WNL  Sensory/Perceptual disturbances:    WNL  Orientation:  oriented to person, place, time/date, and situation  Attention:  Good  Concentration:  Good  Memory:  WNL  Fund of knowledge:   Good  Insight:    Good  Judgment:   Good  Impulse Control:  Good   Reported Symptoms:  stress, worrying.  Risk Assessment: Danger to Self:  No Self-injurious Behavior: No Danger to Others: No Duty to Warn:no Physical Aggression / Violence:No  Access to Firearms a concern: No  Gang Involvement:No  Patient / guardian was educated about steps to take if suicide or homicide risk level increases between visits: no While future psychiatric events cannot be accurately predicted, the patient does not currently require acute inpatient psychiatric care and does not currently meet [facility] involuntary commitment  criteria.  Substance Abuse History: Current substance abuse: No     Past Psychiatric History:   Previous psychological history is significant for anxiety and depression Outpatient Providers:[facility] program History of Psych Hospitalization: No  Psychological Testing:  n/a    Abuse History:  Victim of: No.,  n/a    Report needed: No. Victim of Neglect:No. Perpetrator of  n/a   Witness / Exposure to Domestic Violence: No   Protective Services Involvement: No  Witness to [employer] Violence:  No   Family History:  Family History  Problem Relation Age of Onset   Hypertension Mother    Depression Mother    Obesity Mother    Sleep apnea Father    Obesity Father    Colon cancer Maternal Uncle 61   Diabetes Paternal Aunt    Heart attack Maternal Grandmother    Prostate cancer Maternal Grandfather        dx 36s, metastatic   Stroke Paternal Grandfather    Breast cancer Half-Sister 51   Ovarian cancer Maternal Great-grandmother        dx ?, MGF's mother   Breast cancer Other        multiple of mother's paternal cousins   Ovarian cancer Other        multiple of mother's paternal cousins   Prostate cancer Other        multiple of mother's paternal cousins   Endometrial cancer Neg Hx    Pancreatic cancer Neg Hx     Living situation: The  patient lives with her parents. Pt has an older brother and sister who live on their own.   Family history of mental health issues: none known.  No family history of substance abuse.   Sexual Orientation: Straight  Relationship Status: single  Name of spouse / other:n/a If a parent, number of children / ages:n/a  Support Systems: friends parents  Financial Stress:  No   Income/Employment/Disability: Supported by [employer] and [employer]. 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  She has been out of work for a couple of years and is planning to go to OT graduate school in fall of 2024.     Military Service: No   Educational  History: Education: Scientist, product/process development: Protestant  Any cultural differences that may affect / interfere with treatment:  not applicable   Recreation/Hobbies: Pt does not currently have any hobbies.    Stressors: Other: Pt states her main stressors are her health and being around people for a long period of time.     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  Pt states she has healthy relationships.  Barriers:  none noted.   Legal History: Pending legal issue / charges: The patient has no significant history of legal issues. History of legal issue / charges:  none  Medical History/Surgical History: reviewed Past Medical History:  Diagnosis Date   Anemia    Anxiety    Chest pain    Constipation    Depression    Endometriosis of pelvis    Family history of breast cancer    Family history of colon cancer    Family history of ovarian cancer    Family history of prostate cancer    Hidradenitis suppurativa    PCOS (polycystic ovarian syndrome)    Prediabetes    Sleep apnea    Vitamin D deficiency     Past Surgical History:  Procedure Laterality Date   CERVICAL POLYPECTOMY     DILATATION & CURETTAGE/HYSTEROSCOPY WITH MYOSURE N/A 11/21/2016   Procedure: DILATATION & CURETTAGE/HYSTEROSCOPY WITH MYOSURE;  [name], MD;  Location: [facility];  Service: Gynecology;  [name];   ROBOTIC ASSISTED LAPAROSCOPIC OVARIAN CYSTECTOMY N/A 12/30/2020   Procedure: XI ROBOTIC ASSISTED LAPAROSCOPIC RIGHT SALPINGO-OOPHORECTOMY WITH PELVIC WASHINGS;  [name], MD;  [name];  Service: Gynecology;  [name];   WISDOM TOOTH EXTRACTION      Medications: Current Outpatient Medications  Medication Sig Dispense Refill   clindamycin (CLEOCIN T) 1 % external solution Apply 1 application topically as needed (hidradenitis suppurativa flare). 30 mL 1    hydrOXYzine (VISTARIL) 25 MG capsule TAKE 1 CAPSULE BY MOUTH 2 TIMES DAILY AS NEEDED FOR ANXIETY. 180 capsule 1   norethindrone (AYGESTIN) 5 MG tablet Take 2 tablets by mouth daily.     TRINTELLIX 20 MG TABS tablet Take 20 mg by mouth daily.     Vitamin D, Ergocalciferol, (DRISDOL) 1.25 MG (50000 UNIT) CAPS capsule Take 1 capsule (50,000 Units total) by mouth every 7 (seven) days. 12 capsule 0   VRAYLAR 1.5 MG capsule Take 1.5 mg by mouth.     No current facility-administered medications for this visit.    Allergies  Allergen Reactions   Doxycycline     Upset stomach   Sulfamethoxazole-Trimethoprim     upset stomach    Diagnoses:  F33.1  Plan of Care: Recommend ongoing therapy.  Pt participated in  setting treatment goals.   She wants to improve coping skills.  Plan to meet every two weeks.    Treatment Plan  (treatment plan target date: 07/12/2023). Client Abilities/Strengths  Pt is bright and motivated for therapy. Client Treatment Preferences  Individual therapy.  Client Statement of Needs  Improve coping skills.  Symptoms  Depressed or irritable mood.  Diminished interest in or enjoyment of activities.  Problems Addressed  Unipolar Depression Goals 1. Alleviate depressive symptoms and return to previous level of effective functioning. 2. Appropriately grieve the loss in order to normalize mood and to return to previously adaptive level of functioning. Objective Learn and implement behavioral strategies to overcome depression. Target Date: 2023-07-12 Frequency: Biweekly  Progress: 40 Modality: individual  Related Interventions Engage the client in "behavioral activation," increasing his/her activity level and contact with sources of reward, while identifying processes that inhibit activation.  Use behavioral techniques such as instruction, rehearsal, role-playing, role reversal, as needed, to facilitate activity in the client's daily life; reinforce success. Assist the  client in developing skills that increase the likelihood of deriving pleasure from behavioral activation (e.g., assertiveness skills, developing an exercise plan, less internal/more external focus, increased social involvement); reinforce success. Objective Identify important people in life, past and present, and describe the quality, good and poor, of those relationships. Target Date: 2023-07-12 Frequency: Biweekly  Progress: 40 Modality: individual  Related Interventions Conduct Interpersonal Therapy beginning with the assessment of the client's "interpersonal inventory" of important past and present relationships; develop a case formulation linking depression to grief, interpersonal role disputes, role transitions, and/or interpersonal deficits). Objective Learn and implement problem-solving and decision-making skills. Target Date: 2023-07-12 Frequency: Biweekly  Progress: 40 Modality: individual  Related Interventions Conduct Problem-Solving Therapy using techniques such as psychoeducation, modeling, and role-playing to teach client problem-solving skills (i.e., defining a problem specifically, generating possible solutions, evaluating the pros and cons of each solution, selecting and implementing a plan of action, evaluating the efficacy of the plan, accepting or revising the plan); role-play application of the problem-solving skill to a real life issue. Encourage in the client the development of a positive problem orientation in which problems and solving them are viewed as a natural part of life and not something to be feared, despaired, or avoided. 3. Develop healthy interpersonal relationships that lead to the alleviation and help prevent the relapse of depression. 4. Develop healthy thinking patterns and beliefs about self, others, and the world that lead to the alleviation and help prevent the relapse of depression. 5. Recognize, accept, and cope with feelings of  depression. Diagnosis F33.1  Conditions For Discharge Achievement of treatment goals and objectives   Clint Bolder, LCSW

## 2022-07-11 NOTE — Telephone Encounter (Signed)
Patient said that Kendra Allen called her and cannot schedule Breast MRI for her until she has mammogram.  In her chart last mammo was at [facility] 01/31/2021.  Ok to have her schedule screening mammogram and what to recommend about Breast MRI?

## 2022-07-12 NOTE — Telephone Encounter (Signed)
I contacted [facility] who was the radiology group that said patient would need mammo before MRI to have something to compare to.  Scheduler is sending Dr. Dian Situ, [facility] Radiologist a message with Dr. Albertina Senegal info about genetic counselor recommendation to see what he advises.

## 2022-07-12 NOTE — Telephone Encounter (Signed)
Can you please let Kendra Allen know that the Genetic counselor recommended she have MRI's now and mammograms starting at 13.  Genetics recommendations:   Kendra Allen has been determined to be at high risk for breast cancer. Therefore, we recommend that she proceed with the following breast cancer screening guidelines (per NCCN Breast Cancer Screening and Guidelines Version 1.2021): Clinical encounter every 6-12 months - to begin when identified as being at increased risk, but not prior to age 16y Annual screening mammogram, consider tomosynthesis - to begin 10 years prior to when the youngest family member was diagnosed with breast cancer, not prior to age 60y, or age 28y (whichever comes first)* Annual breast MRI - to begin 10 years prior to when the youngest family member was diagnosed with breast cancer, not prior to age 55y, or age 90y (whichever comes first)* Consider risk reduction strategies *Except in the rare circumstance of a family history of very early-onset breast cancer before the age of 48y.  I've reached out to the genetic counselor for clarification, but I wasn't planning on having her do another mammogram until she was 43.

## 2022-07-25 NOTE — Telephone Encounter (Signed)
Spoke with Kerri Perches at [facility] and she said she did check with Dr. Dian Situ and okay for patient to proceed with MRI.  She is going to call her right now to schedule.

## 2022-07-31 ENCOUNTER — Ambulatory Visit: Payer: BC Managed Care – PPO | Admitting: Psychology

## 2022-08-07 ENCOUNTER — Encounter: Payer: Self-pay | Admitting: Obstetrics and Gynecology

## 2022-08-07 ENCOUNTER — Other Ambulatory Visit: Payer: Self-pay | Admitting: Internal Medicine

## 2022-08-10 ENCOUNTER — Encounter: Payer: Self-pay | Admitting: Obstetrics and Gynecology

## 2022-08-14 ENCOUNTER — Ambulatory Visit (INDEPENDENT_AMBULATORY_CARE_PROVIDER_SITE_OTHER): Payer: Medicaid Other | Admitting: Psychology

## 2022-08-14 DIAGNOSIS — F331 Major depressive disorder, recurrent, moderate: Secondary | ICD-10-CM | POA: Diagnosis not present

## 2022-08-14 NOTE — Progress Notes (Signed)
[facility] Counselor/Therapist Progress Note  Patient ID: JAMEESHA TRAUGHBER, MRN: [id number],    Date: 08/14/2022  Time Spent: 4:00pm-4:45pm   45 minutes    Treatment Type: Individual Therapy  Reported Symptoms: stress   Mental Status Exam: Appearance:  Casual     Behavior: Appropriate  Motor: Normal  Speech/Language:  Normal Rate  Affect: Appropriate  Mood: normal  Thought process: normal  Thought content:   WNL  Sensory/Perceptual disturbances:   WNL  Orientation: oriented to person, place, time/date, and situation  Attention: Good  Concentration: Good  Memory: WNL  Fund of knowledge:  Good  Insight:   Good  Judgment:  Good  Impulse Control: Good   Risk Assessment: Danger to Self:  No Self-injurious Behavior: No Danger to Others: No Duty to Warn:no Physical Aggression / Violence:No  Access to Firearms a concern: No  Gang Involvement:No   Subjective: Pt present for face-to-face individual therapy via video Webex.  Pt consents to telehealth video session due to COVID 19 pandemic. Location of pt: home Location of therapist: home office.  Pt talked about work.  She has been working more hours which is positive for her.  She is working about 20 hours a week.   Pt states work is going well overall but she gets anxious at time.   Addressed pt's anxiety and worked on Radiographer, therapeutic.   Pt has started to go walking again.  Pt is still waiting to hear from graduate school admissions.  She hopes to find out within a couple of weeks.   Encouraged pt to increase self care. Provided supportive therapy.    Interventions: Cognitive Behavioral Therapy and Insight-Oriented  Diagnosis: F33.1  Plan of Care: Recommend ongoing therapy.  Pt participated in setting treatment goals.   She wants to improve coping skills.  Plan to meet every two weeks.    Treatment Plan  (treatment plan target date: 07/12/2023). Client Abilities/Strengths  Pt is bright and motivated for  therapy. Client Treatment Preferences  Individual therapy.  Client Statement of Needs  Improve coping skills.  Symptoms  Depressed or irritable mood.  Diminished interest in or enjoyment of activities.  Problems Addressed  Unipolar Depression Goals 1. Alleviate depressive symptoms and return to previous level of effective functioning. 2. Appropriately grieve the loss in order to normalize mood and to return to previously adaptive level of functioning. Objective Learn and implement behavioral strategies to overcome depression. Target Date: 2023-07-12 Frequency: Biweekly  Progress: 40 Modality: individual  Related Interventions Engage the client in "behavioral activation," increasing his/her activity level and contact with sources of reward, while identifying processes that inhibit activation.  Use behavioral techniques such as instruction, rehearsal, role-playing, role reversal, as needed, to facilitate activity in the client's daily life; reinforce success. Assist the client in developing skills that increase the likelihood of deriving pleasure from behavioral activation (e.g., assertiveness skills, developing an exercise plan, less internal/more external focus, increased social involvement); reinforce success. Objective Identify important people in life, past and present, and describe the quality, good and poor, of those relationships. Target Date: 2023-07-12 Frequency: Biweekly  Progress: 40 Modality: individual  Related Interventions Conduct Interpersonal Therapy beginning with the assessment of the client's "interpersonal inventory" of important past and present relationships; develop a case formulation linking depression to grief, interpersonal role disputes, role transitions, and/or interpersonal deficits). Objective Learn and implement problem-solving and decision-making skills. Target Date: 2023-07-12 Frequency: Biweekly  Progress: 40 Modality: individual  Related  Interventions Conduct Problem-Solving Therapy using  techniques such as psychoeducation, modeling, and role-playing to teach client problem-solving skills (i.e., defining a problem specifically, generating possible solutions, evaluating the pros and cons of each solution, selecting and implementing a plan of action, evaluating the efficacy of the plan, accepting or revising the plan); role-play application of the problem-solving skill to a real life issue. Encourage in the client the development of a positive problem orientation in which problems and solving them are viewed as a natural part of life and not something to be feared, despaired, or avoided. 3. Develop healthy interpersonal relationships that lead to the alleviation and help prevent the relapse of depression. 4. Develop healthy thinking patterns and beliefs about self, others, and the world that lead to the alleviation and help prevent the relapse of depression. 5. Recognize, accept, and cope with feelings of depression. Diagnosis F33.1  Conditions For Discharge Achievement of treatment goals and objectives   Clint Bolder, LCSW

## 2022-08-28 ENCOUNTER — Ambulatory Visit: Payer: BC Managed Care – PPO | Admitting: Psychology

## 2022-08-31 NOTE — Telephone Encounter (Signed)
Yes, thank you for all of your efforts!

## 2022-08-31 NOTE — Telephone Encounter (Signed)
I have been checking to see MRI Breast scheduled but [facility] has left several messages and patient has not called back.  I called patient and she said she has decided she does not want to have the breast MRI.  Ok to remove order?

## 2022-09-11 ENCOUNTER — Ambulatory Visit: Payer: BC Managed Care – PPO | Admitting: Psychology

## 2022-09-25 ENCOUNTER — Ambulatory Visit (INDEPENDENT_AMBULATORY_CARE_PROVIDER_SITE_OTHER): Payer: Medicaid Other | Admitting: Psychology

## 2022-09-25 DIAGNOSIS — F331 Major depressive disorder, recurrent, moderate: Secondary | ICD-10-CM | POA: Diagnosis not present

## 2022-09-25 NOTE — Progress Notes (Signed)
[facility] Counselor/Therapist Progress Note  Patient ID: HEND LUEKER, MRN: [id number],    Date: 09/25/2022  Time Spent: 3:00pm-3:45pm   45 minutes    Treatment Type: Individual Therapy  Reported Symptoms: stress   Mental Status Exam: Appearance:  Casual     Behavior: Appropriate  Motor: Normal  Speech/Language:  Normal Rate  Affect: Appropriate  Mood: normal  Thought process: normal  Thought content:   WNL  Sensory/Perceptual disturbances:   WNL  Orientation: oriented to person, place, time/date, and situation  Attention: Good  Concentration: Good  Memory: WNL  Fund of knowledge:  Good  Insight:   Good  Judgment:  Good  Impulse Control: Good   Risk Assessment: Danger to Self:  No Self-injurious Behavior: No Danger to Others: No Duty to Warn:no Physical Aggression / Violence:No  Access to Firearms a concern: No  Gang Involvement:No   Subjective: Pt present for face-to-face individual therapy via video Webex.  Pt consents to telehealth video session due to COVID 19 pandemic. Location of pt: home Location of therapist: home office.  Pt talked about finding out that she did not get into graduate school.  She was very upset and cried about it.  Pt is not sure what her plan will be next.   Pt is working at [employer] and has been working full time as a Location manager person.   Pt states the job is ok but it is not what she wants to do long term.   She is glad she has more work hours.  Pt feels like all of her plans are up in the air now since she just found out last week that she did not get into graduate school.   Worked with pt on decision analysis and helped her process her disappointment about graduate school.   Pt talked about her mood.  She was down for a few days but otherwise has been ok.  Pt tries to stay connected with friends. Encouraged pt to increase self care. Provided supportive therapy.    Interventions: Cognitive Behavioral Therapy and  Insight-Oriented  Diagnosis: F33.1  Plan of Care: Recommend ongoing therapy.  Pt participated in setting treatment goals.   She wants to improve coping skills.  Plan to meet every two weeks.    Treatment Plan  (treatment plan target date: 07/12/2023). Client Abilities/Strengths  Pt is bright and motivated for therapy. Client Treatment Preferences  Individual therapy.  Client Statement of Needs  Improve coping skills.  Symptoms  Depressed or irritable mood.  Diminished interest in or enjoyment of activities.  Problems Addressed  Unipolar Depression Goals 1. Alleviate depressive symptoms and return to previous level of effective functioning. 2. Appropriately grieve the loss in order to normalize mood and to return to previously adaptive level of functioning. Objective Learn and implement behavioral strategies to overcome depression. Target Date: 2023-07-12 Frequency: Biweekly  Progress: 40 Modality: individual  Related Interventions Engage the client in "behavioral activation," increasing his/her activity level and contact with sources of reward, while identifying processes that inhibit activation.  Use behavioral techniques such as instruction, rehearsal, role-playing, role reversal, as needed, to facilitate activity in the client's daily life; reinforce success. Assist the client in developing skills that increase the likelihood of deriving pleasure from behavioral activation (e.g., assertiveness skills, developing an exercise plan, less internal/more external focus, increased social involvement); reinforce success. Objective Identify important people in life, past and present, and describe the quality, good and poor, of those relationships. Target Date:  2023-07-12 Frequency: Biweekly  Progress: 40 Modality: individual  Related Interventions Conduct Interpersonal Therapy beginning with the assessment of the client's "interpersonal inventory" of important past and present relationships;  develop a case formulation linking depression to grief, interpersonal role disputes, role transitions, and/or interpersonal deficits). Objective Learn and implement problem-solving and decision-making skills. Target Date: 2023-07-12 Frequency: Biweekly  Progress: 40 Modality: individual  Related Interventions Conduct Problem-Solving Therapy using techniques such as psychoeducation, modeling, and role-playing to teach client problem-solving skills (i.e., defining a problem specifically, generating possible solutions, evaluating the pros and cons of each solution, selecting and implementing a plan of action, evaluating the efficacy of the plan, accepting or revising the plan); role-play application of the problem-solving skill to a real life issue. Encourage in the client the development of a positive problem orientation in which problems and solving them are viewed as a natural part of life and not something to be feared, despaired, or avoided. 3. Develop healthy interpersonal relationships that lead to the alleviation and help prevent the relapse of depression. 4. Develop healthy thinking patterns and beliefs about self, others, and the world that lead to the alleviation and help prevent the relapse of depression. 5. Recognize, accept, and cope with feelings of depression. Diagnosis F33.1  Conditions For Discharge Achievement of treatment goals and objectives   Salomon Fick, LCSW

## 2022-10-09 ENCOUNTER — Ambulatory Visit: Payer: BC Managed Care – PPO | Admitting: Psychology

## 2022-10-10 ENCOUNTER — Other Ambulatory Visit: Payer: BC Managed Care – PPO

## 2022-10-13 ENCOUNTER — Ambulatory Visit (INDEPENDENT_AMBULATORY_CARE_PROVIDER_SITE_OTHER): Payer: Medicaid Other | Admitting: Psychology

## 2022-10-13 ENCOUNTER — Other Ambulatory Visit: Payer: Medicaid Other

## 2022-10-13 DIAGNOSIS — F331 Major depressive disorder, recurrent, moderate: Secondary | ICD-10-CM | POA: Diagnosis not present

## 2022-10-13 DIAGNOSIS — E559 Vitamin D deficiency, unspecified: Secondary | ICD-10-CM

## 2022-10-13 NOTE — Progress Notes (Signed)
[facility] Counselor/Therapist Progress Note  Patient ID: Kendra Allen, MRN: [id number],    Date: 10/13/2022  Time Spent: 3:00pm-3:45pm   45 minutes    Treatment Type: Individual Therapy  Reported Symptoms: stress   Mental Status Exam: Appearance:  Casual     Behavior: Appropriate  Motor: Normal  Speech/Language:  Normal Rate  Affect: Appropriate  Mood: normal  Thought process: normal  Thought content:   WNL  Sensory/Perceptual disturbances:   WNL  Orientation: oriented to person, place, time/date, and situation  Attention: Good  Concentration: Good  Memory: WNL  Fund of knowledge:  Good  Insight:   Good  Judgment:  Good  Impulse Control: Good   Risk Assessment: Danger to Self:  No Self-injurious Behavior: No Danger to Others: No Duty to Warn:no Physical Aggression / Violence:No  Access to Firearms a concern: No  Gang Involvement:No   Subjective: Pt present for face-to-face individual therapy via video.  Pt consents to telehealth video session due to COVID 19 pandemic. Location of pt: home Location of therapist: home office.  Pt talked about work.  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   Pt gets anxious about seeing the next patient when one has been mean to her.  Addressed how this impacts pt.  Worked on calming strategies.   Pt is looking for new jobs.    Pt is also trying to find a new church to go to.  She wants a congregation that has more people her age.  Pt has not been exercising as much as she would like.  Addressed how pt can increase her activity level.   Encouraged pt to increase self care. Provided supportive therapy.    Interventions: Cognitive Behavioral Therapy and Insight-Oriented  Diagnosis: F33.1  Plan of Care: Recommend ongoing therapy.  Pt participated in setting treatment goals.   She wants to improve coping skills.  Plan to meet every two weeks.    Treatment Plan  (treatment  plan target date: 07/12/2023). Client Abilities/Strengths  Pt is bright and motivated for therapy. Client Treatment Preferences  Individual therapy.  Client Statement of Needs  Improve coping skills.  Symptoms  Depressed or irritable mood.  Diminished interest in or enjoyment of activities.  Problems Addressed  Unipolar Depression Goals 1. Alleviate depressive symptoms and return to previous level of effective functioning. 2. Appropriately grieve the loss in order to normalize mood and to return to previously adaptive level of functioning. Objective Learn and implement behavioral strategies to overcome depression. Target Date: 2023-07-12 Frequency: Biweekly  Progress: 40 Modality: individual  Related Interventions Engage the client in "behavioral activation," increasing his/her activity level and contact with sources of reward, while identifying processes that inhibit activation.  Use behavioral techniques such as instruction, rehearsal, role-playing, role reversal, as needed, to facilitate activity in the client's daily life; reinforce success. Assist the client in developing skills that increase the likelihood of deriving pleasure from behavioral activation (e.g., assertiveness skills, developing an exercise plan, less internal/more external focus, increased social involvement); reinforce success. Objective Identify important people in life, past and present, and describe the quality, good and poor, of those relationships. Target Date: 2023-07-12 Frequency: Biweekly  Progress: 40 Modality: individual  Related Interventions Conduct Interpersonal Therapy beginning with the assessment of the client's "interpersonal inventory" of important past and present relationships; develop a case formulation linking depression to grief, interpersonal role disputes, role transitions, and/or interpersonal deficits). Objective Learn and  implement problem-solving and decision-making skills. Target Date:  2023-07-12 Frequency: Biweekly  Progress: 40 Modality: individual  Related Interventions Conduct Problem-Solving Therapy using techniques such as psychoeducation, modeling, and role-playing to teach client problem-solving skills (i.e., defining a problem specifically, generating possible solutions, evaluating the pros and cons of each solution, selecting and implementing a plan of action, evaluating the efficacy of the plan, accepting or revising the plan); role-play application of the problem-solving skill to a real life issue. Encourage in the client the development of a positive problem orientation in which problems and solving them are viewed as a natural part of life and not something to be feared, despaired, or avoided. 3. Develop healthy interpersonal relationships that lead to the alleviation and help prevent the relapse of depression. 4. Develop healthy thinking patterns and beliefs about self, others, and the world that lead to the alleviation and help prevent the relapse of depression. 5. Recognize, accept, and cope with feelings of depression. Diagnosis F33.1  Conditions For Discharge Achievement of treatment goals and objectives   Salomon Fick, LCSW

## 2022-10-14 LAB — VITAMIN D 25 HYDROXY (VIT D DEFICIENCY, FRACTURES): Vit D, 25-Hydroxy: 40 ng/mL (ref 30–100)

## 2022-10-23 ENCOUNTER — Ambulatory Visit: Payer: Medicaid Other | Admitting: Psychology

## 2022-11-06 ENCOUNTER — Encounter: Payer: Self-pay | Admitting: Internal Medicine

## 2022-11-06 ENCOUNTER — Ambulatory Visit: Payer: Medicaid Other | Admitting: Psychology

## 2022-11-06 ENCOUNTER — Ambulatory Visit: Payer: Medicaid Other | Admitting: Internal Medicine

## 2022-11-06 VITALS — BP 118/78 | HR 80 | Temp 98.6°F | Ht 65.0 in | Wt 278.0 lb

## 2022-11-06 DIAGNOSIS — R739 Hyperglycemia, unspecified: Secondary | ICD-10-CM

## 2022-11-06 DIAGNOSIS — F32A Depression, unspecified: Secondary | ICD-10-CM

## 2022-11-06 DIAGNOSIS — E669 Obesity, unspecified: Secondary | ICD-10-CM | POA: Diagnosis not present

## 2022-11-06 DIAGNOSIS — F419 Anxiety disorder, unspecified: Secondary | ICD-10-CM | POA: Diagnosis not present

## 2022-11-06 DIAGNOSIS — Z0000 Encounter for general adult medical examination without abnormal findings: Secondary | ICD-10-CM

## 2022-11-06 DIAGNOSIS — Z6839 Body mass index (BMI) 39.0-39.9, adult: Secondary | ICD-10-CM

## 2022-11-06 LAB — URINALYSIS
Bilirubin Urine: NEGATIVE
Hgb urine dipstick: NEGATIVE
Ketones, ur: NEGATIVE
Leukocytes,Ua: NEGATIVE
Nitrite: NEGATIVE
Specific Gravity, Urine: 1.015 (ref 1.000–1.030)
Total Protein, Urine: NEGATIVE
Urine Glucose: NEGATIVE
Urobilinogen, UA: 1 (ref 0.0–1.0)
pH: 7 (ref 5.0–8.0)

## 2022-11-06 LAB — CBC WITH DIFFERENTIAL/PLATELET
Basophils Absolute: 0.1 10*3/uL (ref 0.0–0.1)
Basophils Relative: 0.8 % (ref 0.0–3.0)
Eosinophils Absolute: 0.2 10*3/uL (ref 0.0–0.7)
Eosinophils Relative: 2.4 % (ref 0.0–5.0)
HCT: 44.1 % (ref 36.0–46.0)
Hemoglobin: 14.7 g/dL (ref 12.0–15.0)
Lymphocytes Relative: 30 % (ref 12.0–46.0)
Lymphs Abs: 2 10*3/uL (ref 0.7–4.0)
MCHC: 33.2 g/dL (ref 30.0–36.0)
MCV: 85.8 fl (ref 78.0–100.0)
Monocytes Absolute: 0.5 10*3/uL (ref 0.1–1.0)
Monocytes Relative: 7.2 % (ref 3.0–12.0)
Neutro Abs: 4 10*3/uL (ref 1.4–7.7)
Neutrophils Relative %: 59.6 % (ref 43.0–77.0)
Platelets: 375 10*3/uL (ref 150.0–400.0)
RBC: 5.15 Mil/uL — ABNORMAL HIGH (ref 3.87–5.11)
RDW: 14.5 % (ref 11.5–15.5)
WBC: 6.7 10*3/uL (ref 4.0–10.5)

## 2022-11-06 LAB — LIPID PANEL
Cholesterol: 161 mg/dL (ref 0–200)
HDL: 28.4 mg/dL — ABNORMAL LOW (ref >39.00)
LDL Cholesterol: 123 mg/dL — ABNORMAL HIGH (ref 0–99)
NonHDL: 132.62
Total CHOL/HDL Ratio: 6
Triglycerides: 47 mg/dL (ref 0.0–149.0)
VLDL: 9.4 mg/dL (ref 0.0–40.0)

## 2022-11-06 LAB — COMPREHENSIVE METABOLIC PANEL
ALT: 29 U/L (ref 0–35)
AST: 20 U/L (ref 0–37)
Albumin: 4 g/dL (ref 3.5–5.2)
Alkaline Phosphatase: 68 U/L (ref 39–117)
BUN: 5 mg/dL — ABNORMAL LOW (ref 6–23)
CO2: 25 mEq/L (ref 19–32)
Calcium: 9.2 mg/dL (ref 8.4–10.5)
Chloride: 103 mEq/L (ref 96–112)
Creatinine, Ser: 0.61 mg/dL (ref 0.40–1.20)
GFR: 123.51 mL/min (ref >60.00)
Glucose, Bld: 89 mg/dL (ref 70–99)
Potassium: 3.8 mEq/L (ref 3.5–5.1)
Sodium: 138 mEq/L (ref 135–145)
Total Bilirubin: 0.7 mg/dL (ref 0.2–1.2)
Total Protein: 7.5 g/dL (ref 6.0–8.3)

## 2022-11-06 LAB — HEMOGLOBIN A1C: Hgb A1c MFr Bld: 6.3 % (ref 4.6–6.5)

## 2022-11-06 MED ORDER — VRAYLAR 3 MG PO CAPS: 3.0000 mg | ORAL_CAPSULE | Freq: Every day | ORAL | 11 refills | Status: DC

## 2022-11-06 NOTE — Progress Notes (Signed)
Subjective:  Patient ID: Kendra Allen, female    DOB: 18-Oct-1995  Age: 27 y.o. MRN: [id number]  CC: Obesity (Discuss BP, Blood Sugars, Cholesterol and weight management)   HPI Brayden C Ramsburg presents for a well exam F/u on depression, obesity  Outpatient Medications Prior to Visit  Medication Sig Dispense Refill   hydrOXYzine (VISTARIL) 25 MG capsule TAKE 1 CAPSULE BY MOUTH 2 TIMES DAILY AS NEEDED FOR ANXIETY. 180 capsule 1   norethindrone (AYGESTIN) 5 MG tablet Take 2 tablets by mouth daily. Take three tabs daily     TRINTELLIX 20 MG TABS tablet Take 20 mg by mouth daily.     clindamycin (CLEOCIN T) 1 % external solution Apply 1 application topically as needed (hidradenitis suppurativa flare). 30 mL 1   Vitamin D, Ergocalciferol, (DRISDOL) 1.25 MG (50000 UNIT) CAPS capsule Take 1 capsule (50,000 Units total) by mouth every 7 (seven) days. 12 capsule 0   VRAYLAR 3 MG capsule Take 3 mg by mouth daily.     VRAYLAR 1.5 MG capsule Take 1.5 mg by mouth.     No facility-administered medications prior to visit.    ROS: Review of Systems  Constitutional:  Negative for activity change, appetite change, chills, fatigue and unexpected weight change.  HENT:  Negative for congestion, mouth sores and sinus pressure.   Eyes:  Negative for visual disturbance.  Respiratory:  Negative for cough and chest tightness.   Gastrointestinal:  Negative for abdominal pain and nausea.  Genitourinary:  Negative for difficulty urinating, frequency and vaginal pain.  Musculoskeletal:  Negative for back pain and gait problem.  Skin:  Negative for pallor and rash.  Neurological:  Negative for dizziness, tremors, weakness, numbness and headaches.  Psychiatric/Behavioral:  Negative for confusion, sleep disturbance and suicidal ideas. The patient is nervous/anxious.     Objective:  BP 118/78 (BP Location: Left Arm, Patient Position: Sitting, Cuff Size: Large)   Pulse 80   Temp 98.6 F (37 C) (Oral)   Ht  5\' 5"  (1.651 m)   Wt 278 lb (126.1 kg)   SpO2 97%   BMI 46.26 kg/m   BP Readings from Last 3 Encounters:  11/06/22 118/78  07/06/22 110/80  07/06/21 120/78    Wt Readings from Last 3 Encounters:  11/06/22 278 lb (126.1 kg)  07/06/22 288 lb (130.6 kg)  07/06/21 271 lb (122.9 kg)    Physical Exam Constitutional:      General: She is not in acute distress.    Appearance: She is well-developed. She is obese.  HENT:     Head: Normocephalic.     Right Ear: External ear normal.     Left Ear: External ear normal.     Nose: Nose normal.  Eyes:     General:        Right eye: No discharge.        Left eye: No discharge.     Conjunctiva/sclera: Conjunctivae normal.     Pupils: Pupils are equal, round, and reactive to light.  Neck:     Thyroid: No thyromegaly.     Vascular: No JVD.     Trachea: No tracheal deviation.  Cardiovascular:     Rate and Rhythm: Normal rate and regular rhythm.     Heart sounds: Normal heart sounds.  Pulmonary:     Effort: No respiratory distress.     Breath sounds: No stridor. No wheezing.  Abdominal:     General: Bowel sounds are normal. There is no  distension.     Palpations: Abdomen is soft. There is no mass.     Tenderness: There is no abdominal tenderness. There is no guarding or rebound.  Musculoskeletal:        General: No tenderness.     Cervical back: Normal range of motion and neck supple. No rigidity.  Lymphadenopathy:     Cervical: No cervical adenopathy.  Skin:    Findings: No erythema or rash.  Neurological:     Cranial Nerves: No cranial nerve deficit.     Motor: No abnormal muscle tone.     Coordination: Coordination normal.     Deep Tendon Reflexes: Reflexes normal.  Psychiatric:        Behavior: Behavior normal.        Thought Content: Thought content normal.        Judgment: Judgment normal.     Lab Results  Component Value Date   WBC 10.4 05/03/2021   HGB 11.5 (L) 05/03/2021   HCT 36.0 05/03/2021   PLT 316  05/03/2021   GLUCOSE 104 (H) 05/03/2021   CHOL 140 04/09/2020   TRIG 97.0 04/09/2020   HDL 39.30 04/09/2020   LDLCALC 81 04/09/2020   ALT 6 05/03/2021   AST 8 (L) 05/03/2021   NA 137 05/03/2021   K 3.2 (L) 05/03/2021   CL 103 05/03/2021   CREATININE 0.65 05/03/2021   BUN 6 05/03/2021   CO2 24 05/03/2021   TSH 2.69 04/09/2020   HGBA1C 5.9 04/09/2020    MR BREAST BILATERAL W WO CONTRAST INC CAD  Result Date: 08/04/2021 CLINICAL DATA:  High risk screening. Family history of breast carcinoma. EXAM: BILATERAL BREAST MRI WITH AND WITHOUT CONTRAST TECHNIQUE: Multiplanar, multisequence MR images of both breasts were obtained prior to and following the intravenous administration of 10 ml of Gadavist Three-dimensional MR images were rendered by post-processing of the original MR data on an independent workstation. The three-dimensional MR images were interpreted, and findings are reported in the following complete MRI report for this study. Three dimensional images were evaluated at the independent interpreting workstation using the DynaCAD thin client. COMPARISON:  Previous exam(s). FINDINGS: Breast composition: b. Scattered fibroglandular tissue. Background parenchymal enhancement: Mild Right breast: No mass or abnormal enhancement. Left breast: No mass or abnormal enhancement. Lymph nodes: No abnormal appearing lymph nodes. Ancillary findings:  None. IMPRESSION: Negative exam.  No MRI evidence of breast malignancy. RECOMMENDATION: 1. Annual screening mammography. Last screening study performed on 01/31/2021. 2. Supplemental high risk screening breast MRI should be considered in patients with a greater than 20% lifetime risk of breast carcinoma, based on the American Cancer Society guidelines. BI-RADS CATEGORY  1: Negative. Electronically Signed   By: Amie Portland M.D.   On: 08/04/2021 13:57   Assessment & Plan:   Problem List Items Addressed This Visit     Well adult exam - Primary (Chronic)      We discussed age appropriate health related issues, including available/recomended screening tests and vaccinations. Labs were ordered to be later reviewed . All questions were answered. We discussed one or more of the following - seat belt use, use of sunscreen/sun exposure exercise, fall risk reduction, second hand smoke exposure, firearm use and storage, seat belt use, a need for adhering to healthy diet and exercise. Labs were ordered.  All questions were answered.       Relevant Orders   CBC with Differential/Platelet   Comprehensive metabolic panel   Lipid panel   Hemoglobin A1c  TSH   Urinalysis   Obesity    Cont w/wt loss effort      Anxiety and depression    On Trintellix and Vraylar      Relevant Orders   CBC with Differential/Platelet   Comprehensive metabolic panel   Lipid panel   Hemoglobin A1c   TSH   Urinalysis   Hyperglycemia   Relevant Orders   Hemoglobin A1c      Meds ordered this encounter  Medications   VRAYLAR 3 MG capsule    Sig: Take 1 capsule (3 mg total) by mouth daily.    Dispense:  30 capsule    Refill:  11      Follow-up: Return in about 6 months (around 05/09/2023) for a follow-up visit.  Sonda Primes, MD

## 2022-11-06 NOTE — Assessment & Plan Note (Signed)

## 2022-11-06 NOTE — Assessment & Plan Note (Signed)
Cont w/wt loss effort 

## 2022-11-06 NOTE — Assessment & Plan Note (Addendum)
On Trintellix and Vraylar

## 2022-11-07 LAB — TSH: TSH: 2.3 u[IU]/mL (ref 0.35–5.50)

## 2022-11-10 ENCOUNTER — Ambulatory Visit (INDEPENDENT_AMBULATORY_CARE_PROVIDER_SITE_OTHER): Payer: Medicaid Other | Admitting: Psychology

## 2022-11-10 DIAGNOSIS — F331 Major depressive disorder, recurrent, moderate: Secondary | ICD-10-CM | POA: Diagnosis not present

## 2022-11-10 NOTE — Progress Notes (Signed)
[facility] Counselor/Therapist Progress Note  Patient ID: Kendra Allen, MRN: [id number],    Date: 11/10/2022  Time Spent: 3:00pm-3:45pm   45 minutes    Treatment Type: Individual Therapy  Reported Symptoms: stress   Mental Status Exam: Appearance:  Casual     Behavior: Appropriate  Motor: Normal  Speech/Language:  Normal Rate  Affect: Appropriate  Mood: normal  Thought process: normal  Thought content:   WNL  Sensory/Perceptual disturbances:   WNL  Orientation: oriented to person, place, time/date, and situation  Attention: Good  Concentration: Good  Memory: WNL  Fund of knowledge:  Good  Insight:   Good  Judgment:  Good  Impulse Control: Good   Risk Assessment: Danger to Self:  No Self-injurious Behavior: No Danger to Others: No Duty to Warn:no Physical Aggression / Violence:No  Access to Firearms a concern: No  Gang Involvement:No   Subjective: Pt present for face-to-face individual therapy via video.  Pt consents to telehealth video session due to COVID 19 pandemic. Location of pt: home Location of therapist: home office.  Pt talked about work.  Pt is applying to new jobs.  She is hoping to get full time job with benefits.    Pt states she went on a camping / glamping trip with friends last weekend and had a good time.     Pt talked about her health.  She saw her PCP and psychiatrist.   Her blood work was normal.   She is on the same psychiatric meds.   Pt has not been exercising as much as she would like.  Addressed how pt can increase her activity level.   Pt is trying to figure out her career goals.  Helped pt explore her thoughts and worked on [employer], multimedia.   Encouraged pt to increase self care. Provided supportive therapy.    Interventions: Cognitive Behavioral Therapy and Insight-Oriented  Diagnosis: F33.1  Plan of Care: Recommend ongoing therapy.  Pt participated in setting treatment goals.   She wants to improve coping skills.   Plan to meet every two weeks.    Treatment Plan  (treatment plan target date: 07/12/2023). Client Abilities/Strengths  Pt is bright and motivated for therapy. Client Treatment Preferences  Individual therapy.  Client Statement of Needs  Improve coping skills.  Symptoms  Depressed or irritable mood.  Diminished interest in or enjoyment of activities.  Problems Addressed  Unipolar Depression Goals 1. Alleviate depressive symptoms and return to previous level of effective functioning. 2. Appropriately grieve the loss in order to normalize mood and to return to previously adaptive level of functioning. Objective Learn and implement behavioral strategies to overcome depression. Target Date: 2023-07-12 Frequency: Biweekly  Progress: 40 Modality: individual  Related Interventions Engage the client in "behavioral activation," increasing his/her activity level and contact with sources of reward, while identifying processes that inhibit activation.  Use behavioral techniques such as instruction, rehearsal, role-playing, role reversal, as needed, to facilitate activity in the client's daily life; reinforce success. Assist the client in developing skills that increase the likelihood of deriving pleasure from behavioral activation (e.g., assertiveness skills, developing an exercise plan, less internal/more external focus, increased social involvement); reinforce success. Objective Identify important people in life, past and present, and describe the quality, good and poor, of those relationships. Target Date: 2023-07-12 Frequency: Biweekly  Progress: 40 Modality: individual  Related Interventions Conduct Interpersonal Therapy beginning with the assessment of the client's "interpersonal inventory" of important past and present relationships; develop a case formulation  linking depression to grief, interpersonal role disputes, role transitions, and/or interpersonal deficits). Objective Learn and  implement problem-solving and decision-making skills. Target Date: 2023-07-12 Frequency: Biweekly  Progress: 40 Modality: individual  Related Interventions Conduct Problem-Solving Therapy using techniques such as psychoeducation, modeling, and role-playing to teach client problem-solving skills (i.e., defining a problem specifically, generating possible solutions, evaluating the pros and cons of each solution, selecting and implementing a plan of action, evaluating the efficacy of the plan, accepting or revising the plan); role-play application of the problem-solving skill to a real life issue. Encourage in the client the development of a positive problem orientation in which problems and solving them are viewed as a natural part of life and not something to be feared, despaired, or avoided. 3. Develop healthy interpersonal relationships that lead to the alleviation and help prevent the relapse of depression. 4. Develop healthy thinking patterns and beliefs about self, others, and the world that lead to the alleviation and help prevent the relapse of depression. 5. Recognize, accept, and cope with feelings of depression. Diagnosis F33.1  Conditions For Discharge Achievement of treatment goals and objectives   Salomon Fick, LCSW

## 2022-11-24 ENCOUNTER — Ambulatory Visit (INDEPENDENT_AMBULATORY_CARE_PROVIDER_SITE_OTHER): Payer: Medicaid Other | Admitting: Psychology

## 2022-11-24 DIAGNOSIS — F331 Major depressive disorder, recurrent, moderate: Secondary | ICD-10-CM

## 2022-11-24 NOTE — Progress Notes (Signed)
[facility] Counselor/Therapist Progress Note  Patient ID: Kendra Allen, MRN: [id number],    Date: 11/24/2022  Time Spent: 3:00pm-3:45pm   45 minutes    Treatment Type: Individual Therapy  Reported Symptoms: stress   Mental Status Exam: Appearance:  Casual     Behavior: Appropriate  Motor: Normal  Speech/Language:  Normal Rate  Affect: Appropriate  Mood: normal  Thought process: normal  Thought content:   WNL  Sensory/Perceptual disturbances:   WNL  Orientation: oriented to person, place, time/date, and situation  Attention: Good  Concentration: Good  Memory: WNL  Fund of knowledge:  Good  Insight:   Good  Judgment:  Good  Impulse Control: Good   Risk Assessment: Danger to Self:  No Self-injurious Behavior: No Danger to Others: No Duty to Warn:no Physical Aggression / Violence:No  Access to Firearms a concern: No  Gang Involvement:No   Subjective: Pt present for face-to-face individual therapy via video.  Pt consents to telehealth video session due to COVID 19 pandemic. Location of pt: home Location of therapist: home office.  Pt talked about her job.   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  Pt feels positive about this job opportunity. Pt talked about having a family gathering last weekend.  It was stressful for pt bc some of the family started to argue.  Pt also found out that her brother and girlfriend will be moving.   Pt's family is not happy with that decision.   Pt feels the weight of other people's issues and problems.   Worked with pt on how she can compartmentalize and not take on other's problems.   Worked on worry management.     Encouraged pt to increase self care. Provided supportive therapy.    Interventions: Cognitive Behavioral Therapy and Insight-Oriented  Diagnosis: F33.1  Plan of Care: Recommend ongoing therapy.  Pt participated in setting treatment goals.   She  wants to improve coping skills.  Plan to meet every two weeks.    Treatment Plan  (treatment plan target date: 07/12/2023). Client Abilities/Strengths  Pt is bright and motivated for therapy. Client Treatment Preferences  Individual therapy.  Client Statement of Needs  Improve coping skills.  Symptoms  Depressed or irritable mood.  Diminished interest in or enjoyment of activities.  Problems Addressed  Unipolar Depression Goals 1. Alleviate depressive symptoms and return to previous level of effective functioning. 2. Appropriately grieve the loss in order to normalize mood and to return to previously adaptive level of functioning. Objective Learn and implement behavioral strategies to overcome depression. Target Date: 2023-07-12 Frequency: Biweekly  Progress: 40 Modality: individual  Related Interventions Engage the client in "behavioral activation," increasing his/her activity level and contact with sources of reward, while identifying processes that inhibit activation.  Use behavioral techniques such as instruction, rehearsal, role-playing, role reversal, as needed, to facilitate activity in the client's daily life; reinforce success. Assist the client in developing skills that increase the likelihood of deriving pleasure from behavioral activation (e.g., assertiveness skills, developing an exercise plan, less internal/more external focus, increased social involvement); reinforce success. Objective Identify important people in life, past and present, and describe the quality, good and poor, of those relationships. Target Date: 2023-07-12 Frequency: Biweekly  Progress: 40 Modality: individual  Related Interventions Conduct Interpersonal Therapy beginning with the assessment of the client's "interpersonal inventory" of important past and present relationships; develop a case formulation linking depression to  grief, interpersonal role disputes, role transitions, and/or interpersonal  deficits). Objective Learn and implement problem-solving and decision-making skills. Target Date: 2023-07-12 Frequency: Biweekly  Progress: 40 Modality: individual  Related Interventions Conduct Problem-Solving Therapy using techniques such as psychoeducation, modeling, and role-playing to teach client problem-solving skills (i.e., defining a problem specifically, generating possible solutions, evaluating the pros and cons of each solution, selecting and implementing a plan of action, evaluating the efficacy of the plan, accepting or revising the plan); role-play application of the problem-solving skill to a real life issue. Encourage in the client the development of a positive problem orientation in which problems and solving them are viewed as a natural part of life and not something to be feared, despaired, or avoided. 3. Develop healthy interpersonal relationships that lead to the alleviation and help prevent the relapse of depression. 4. Develop healthy thinking patterns and beliefs about self, others, and the world that lead to the alleviation and help prevent the relapse of depression. 5. Recognize, accept, and cope with feelings of depression. Diagnosis F33.1  Conditions For Discharge Achievement of treatment goals and objectives   Salomon Fick, LCSW

## 2022-12-15 ENCOUNTER — Ambulatory Visit (INDEPENDENT_AMBULATORY_CARE_PROVIDER_SITE_OTHER): Payer: Medicaid Other | Admitting: Psychology

## 2022-12-15 DIAGNOSIS — F331 Major depressive disorder, recurrent, moderate: Secondary | ICD-10-CM | POA: Diagnosis not present

## 2022-12-15 NOTE — Progress Notes (Signed)
[facility] Counselor/Therapist Progress Note  Patient ID: Kendra Allen, MRN: [id number],    Date: 12/15/2022  Time Spent: 2:00pm-2:45pm   45 minutes    Treatment Type: Individual Therapy  Reported Symptoms: stress   Mental Status Exam: Appearance:  Casual     Behavior: Appropriate  Motor: Normal  Speech/Language:  Normal Rate  Affect: Appropriate  Mood: normal  Thought process: normal  Thought content:   WNL  Sensory/Perceptual disturbances:   WNL  Orientation: oriented to person, place, time/date, and situation  Attention: Good  Concentration: Good  Memory: WNL  Fund of knowledge:  Good  Insight:   Good  Judgment:  Good  Impulse Control: Good   Risk Assessment: Danger to Self:  No Self-injurious Behavior: No Danger to Others: No Duty to Warn:no Physical Aggression / Violence:No  Access to Firearms a concern: No  Gang Involvement:No   Subjective: Pt present for face-to-face individual therapy via video.  Pt consents to telehealth video session and is aware of limitations of virtual sessions. Location of pt: home Location of therapist: home office.  Pt talked about her job [employer].  She had two interviews for a job she is interested in and is waiting to hear if she is hired.   Pt talked about feeling a little down lately.  She is not sure what triggered it.  Helped pt process her feelings and worked on coping strategies.  Identified that worrying about finding a new job and all of her "adulting" responsibilities could have contributed to her mood decline.  Worked on worry management and thought reframing since pt is worrying about the future.  Pt has met a guy on a dating app.  They have talked online and plan to meet in person next week.    She is a little nervous about meeting him but hopes that at least they can be friends.  Encouraged pt to increase self care. Provided supportive therapy.    Interventions: Cognitive Behavioral Therapy and  Insight-Oriented  Diagnosis: F33.1  Plan of Care: Recommend ongoing therapy.  Pt participated in setting treatment goals.   She wants to improve coping skills.  Plan to meet every two weeks.    Treatment Plan  (treatment plan target date: 07/12/2023). Client Abilities/Strengths  Pt is bright and motivated for therapy. Client Treatment Preferences  Individual therapy.  Client Statement of Needs  Improve coping skills.  Symptoms  Depressed or irritable mood.  Diminished interest in or enjoyment of activities.  Problems Addressed  Unipolar Depression Goals 1. Alleviate depressive symptoms and return to previous level of effective functioning. 2. Appropriately grieve the loss in order to normalize mood and to return to previously adaptive level of functioning. Objective Learn and implement behavioral strategies to overcome depression. Target Date: 2023-07-12 Frequency: Biweekly  Progress: 40 Modality: individual  Related Interventions Engage the client in "behavioral activation," increasing his/her activity level and contact with sources of reward, while identifying processes that inhibit activation.  Use behavioral techniques such as instruction, rehearsal, role-playing, role reversal, as needed, to facilitate activity in the client's daily life; reinforce success. Assist the client in developing skills that increase the likelihood of deriving pleasure from behavioral activation (e.g., assertiveness skills, developing an exercise plan, less internal/more external focus, increased social involvement); reinforce success. Objective Identify important people in life, past and present, and describe the quality, good and poor, of those relationships. Target Date: 2023-07-12 Frequency: Biweekly  Progress: 40 Modality: individual  Related Interventions Conduct Interpersonal Therapy beginning  with the assessment of the client's "interpersonal inventory" of important past and present relationships;  develop a case formulation linking depression to grief, interpersonal role disputes, role transitions, and/or interpersonal deficits). Objective Learn and implement problem-solving and decision-making skills. Target Date: 2023-07-12 Frequency: Biweekly  Progress: 40 Modality: individual  Related Interventions Conduct Problem-Solving Therapy using techniques such as psychoeducation, modeling, and role-playing to teach client problem-solving skills (i.e., defining a problem specifically, generating possible solutions, evaluating the pros and cons of each solution, selecting and implementing a plan of action, evaluating the efficacy of the plan, accepting or revising the plan); role-play application of the problem-solving skill to a real life issue. Encourage in the client the development of a positive problem orientation in which problems and solving them are viewed as a natural part of life and not something to be feared, despaired, or avoided. 3. Develop healthy interpersonal relationships that lead to the alleviation and help prevent the relapse of depression. 4. Develop healthy thinking patterns and beliefs about self, others, and the world that lead to the alleviation and help prevent the relapse of depression. 5. Recognize, accept, and cope with feelings of depression. Diagnosis F33.1  Conditions For Discharge Achievement of treatment goals and objectives   Salomon Fick, LCSW

## 2023-01-17 ENCOUNTER — Ambulatory Visit (INDEPENDENT_AMBULATORY_CARE_PROVIDER_SITE_OTHER): Payer: Medicaid Other | Admitting: Psychology

## 2023-01-17 DIAGNOSIS — F331 Major depressive disorder, recurrent, moderate: Secondary | ICD-10-CM

## 2023-01-17 NOTE — Progress Notes (Signed)
[facility] Counselor/Therapist Progress Note  Patient ID: EMRI CADD, MRN: [id number],    Date: 01/17/2023  Time Spent: 5:00pm-5:45pm   45 minutes    Treatment Type: Individual Therapy  Reported Symptoms: stress   Mental Status Exam: Appearance:  Casual     Behavior: Appropriate  Motor: Normal  Speech/Language:  Normal Rate  Affect: Appropriate  Mood: normal  Thought process: normal  Thought content:   WNL  Sensory/Perceptual disturbances:   WNL  Orientation: oriented to person, place, time/date, and situation  Attention: Good  Concentration: Good  Memory: WNL  Fund of knowledge:  Good  Insight:   Good  Judgment:  Good  Impulse Control: Good   Risk Assessment: Danger to Self:  No Self-injurious Behavior: No Danger to Others: No Duty to Warn:no Physical Aggression / Violence:No  Access to Firearms a concern: No  Gang Involvement:No   Subjective: Pt present for face-to-face individual therapy via video.  Pt consents to telehealth video session and is aware of limitations of virtual sessions. Location of pt: home Location of therapist: home office.  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   This is pt's first week there and she likes it so far.   Before starting the job pt was anxious bc of the unknown.  Addressed how she managed her anxiety. Pt is also adjusting to working full time.   She is planning to work on getting her own place next summer. Pt talked about dating.  She facetimed with a guy and planned to meet him in person but he did not show up.  This was disappointing for pt.  Helped pt not personalize the experience. Pt talked about her extended family.   She is working on boundary setting with some family members.   Pt talked about her mood.  She has been ok for the most part but feels down at times.  She copes by trying to take walks and engage in positive self talk.  Pt is also  relying on her faith.  Encouraged pt to increase self care. Provided supportive therapy.    Interventions: Cognitive Behavioral Therapy and Insight-Oriented  Diagnosis: F33.1  Plan of Care: Recommend ongoing therapy.  Pt participated in setting treatment goals.   She wants to improve coping skills.  Plan to meet every two weeks.    Treatment Plan  (treatment plan target date: 07/12/2023). Client Abilities/Strengths  Pt is bright and motivated for therapy. Client Treatment Preferences  Individual therapy.  Client Statement of Needs  Improve coping skills.  Symptoms  Depressed or irritable mood.  Diminished interest in or enjoyment of activities.  Problems Addressed  Unipolar Depression Goals 1. Alleviate depressive symptoms and return to previous level of effective functioning. 2. Appropriately grieve the loss in order to normalize mood and to return to previously adaptive level of functioning. Objective Learn and implement behavioral strategies to overcome depression. Target Date: 2023-07-12 Frequency: Biweekly  Progress: 40 Modality: individual  Related Interventions Engage the client in "behavioral activation," increasing his/her activity level and contact with sources of reward, while identifying processes that inhibit activation.  Use behavioral techniques such as instruction, rehearsal, role-playing, role reversal, as needed, to facilitate activity in the client's daily life; reinforce success. Assist the client in developing skills that increase the likelihood of deriving pleasure from behavioral activation (e.g., assertiveness skills, developing an exercise plan, less internal/more external focus, increased social involvement); reinforce success. Objective Identify  important people in life, past and present, and describe the quality, good and poor, of those relationships. Target Date: 2023-07-12 Frequency: Biweekly  Progress: 40 Modality: individual  Related  Interventions Conduct Interpersonal Therapy beginning with the assessment of the client's "interpersonal inventory" of important past and present relationships; develop a case formulation linking depression to grief, interpersonal role disputes, role transitions, and/or interpersonal deficits). Objective Learn and implement problem-solving and decision-making skills. Target Date: 2023-07-12 Frequency: Biweekly  Progress: 40 Modality: individual  Related Interventions Conduct Problem-Solving Therapy using techniques such as psychoeducation, modeling, and role-playing to teach client problem-solving skills (i.e., defining a problem specifically, generating possible solutions, evaluating the pros and cons of each solution, selecting and implementing a plan of action, evaluating the efficacy of the plan, accepting or revising the plan); role-play application of the problem-solving skill to a real life issue. Encourage in the client the development of a positive problem orientation in which problems and solving them are viewed as a natural part of life and not something to be feared, despaired, or avoided. 3. Develop healthy interpersonal relationships that lead to the alleviation and help prevent the relapse of depression. 4. Develop healthy thinking patterns and beliefs about self, others, and the world that lead to the alleviation and help prevent the relapse of depression. 5. Recognize, accept, and cope with feelings of depression. Diagnosis F33.1  Conditions For Discharge Achievement of treatment goals and objectives   Salomon Fick, LCSW

## 2023-01-26 NOTE — Addendum Note (Signed)
Addended by: Leda Min on: 01/26/2023 03:17 PM   Modules accepted: Orders

## 2023-02-01 ENCOUNTER — Ambulatory Visit (INDEPENDENT_AMBULATORY_CARE_PROVIDER_SITE_OTHER): Payer: 59 | Admitting: Psychology

## 2023-02-01 DIAGNOSIS — F331 Major depressive disorder, recurrent, moderate: Secondary | ICD-10-CM | POA: Diagnosis not present

## 2023-02-01 NOTE — Progress Notes (Signed)
[facility] Counselor/Therapist Progress Note  Patient ID: Kendra Allen, MRN: [id number],    Date: 02/01/2023  Time Spent: 5:00pm-5:45pm   45 minutes    Treatment Type: Individual Therapy  Reported Symptoms: stress   Mental Status Exam: Appearance:  Casual     Behavior: Appropriate  Motor: Normal  Speech/Language:  Normal Rate  Affect: Appropriate  Mood: normal  Thought process: normal  Thought content:   WNL  Sensory/Perceptual disturbances:   WNL  Orientation: oriented to person, place, time/date, and situation  Attention: Good  Concentration: Good  Memory: WNL  Fund of knowledge:  Good  Insight:   Good  Judgment:  Good  Impulse Control: Good   Risk Assessment: Danger to Self:  No Self-injurious Behavior: No Danger to Others: No Duty to Warn:no Physical Aggression / Violence:No  Access to Firearms a concern: No  Gang Involvement:No   Subjective: Pt present for face-to-face individual therapy via video.  Pt consents to telehealth video session and is aware of limitations of virtual sessions. Location of pt: home Location of therapist: home office.  Pt talked about work.  It is her 3rd week in her new job and things continue to go well.   She is trying to figure out how to fit in exercise now that she is working full time.   Pt is still adjusting to full time hours.  She has been tired on the weekends but still forces herself to be active and involved with friends.  Pt is working on keeping her self talk positive.   Her mood has been stable the past couple of weeks.   Encouraged pt to increase self care. Provided supportive therapy.    Interventions: Cognitive Behavioral Therapy and Insight-Oriented  Diagnosis: F33.1  Plan of Care: Recommend ongoing therapy.  Pt participated in setting treatment goals.   She wants to improve coping skills.  Plan to meet every two weeks.    Treatment Plan  (treatment plan target date: 07/12/2023). Client  Abilities/Strengths  Pt is bright and motivated for therapy. Client Treatment Preferences  Individual therapy.  Client Statement of Needs  Improve coping skills.  Symptoms  Depressed or irritable mood.  Diminished interest in or enjoyment of activities.  Problems Addressed  Unipolar Depression Goals 1. Alleviate depressive symptoms and return to previous level of effective functioning. 2. Appropriately grieve the loss in order to normalize mood and to return to previously adaptive level of functioning. Objective Learn and implement behavioral strategies to overcome depression. Target Date: 2023-07-12 Frequency: Biweekly  Progress: 40 Modality: individual  Related Interventions Engage the client in "behavioral activation," increasing his/her activity level and contact with sources of reward, while identifying processes that inhibit activation.  Use behavioral techniques such as instruction, rehearsal, role-playing, role reversal, as needed, to facilitate activity in the client's daily life; reinforce success. Assist the client in developing skills that increase the likelihood of deriving pleasure from behavioral activation (e.g., assertiveness skills, developing an exercise plan, less internal/more external focus, increased social involvement); reinforce success. Objective Identify important people in life, past and present, and describe the quality, good and poor, of those relationships. Target Date: 2023-07-12 Frequency: Biweekly  Progress: 40 Modality: individual  Related Interventions Conduct Interpersonal Therapy beginning with the assessment of the client's "interpersonal inventory" of important past and present relationships; develop a case formulation linking depression to grief, interpersonal role disputes, role transitions, and/or interpersonal deficits). Objective Learn and implement problem-solving and decision-making skills. Target Date: 2023-07-12 Frequency: Biweekly  Progress: 40 Modality: individual  Related Interventions Conduct Problem-Solving Therapy using techniques such as psychoeducation, modeling, and role-playing to teach client problem-solving skills (i.e., defining a problem specifically, generating possible solutions, evaluating the pros and cons of each solution, selecting and implementing a plan of action, evaluating the efficacy of the plan, accepting or revising the plan); role-play application of the problem-solving skill to a real life issue. Encourage in the client the development of a positive problem orientation in which problems and solving them are viewed as a natural part of life and not something to be feared, despaired, or avoided. 3. Develop healthy interpersonal relationships that lead to the alleviation and help prevent the relapse of depression. 4. Develop healthy thinking patterns and beliefs about self, others, and the world that lead to the alleviation and help prevent the relapse of depression. 5. Recognize, accept, and cope with feelings of depression. Diagnosis F33.1  Conditions For Discharge Achievement of treatment goals and objectives   Salomon Fick, LCSW

## 2023-02-07 DIAGNOSIS — F331 Major depressive disorder, recurrent, moderate: Secondary | ICD-10-CM | POA: Diagnosis not present

## 2023-02-07 DIAGNOSIS — Z1331 Encounter for screening for depression: Secondary | ICD-10-CM | POA: Diagnosis not present

## 2023-02-07 DIAGNOSIS — G4730 Sleep apnea, unspecified: Secondary | ICD-10-CM | POA: Diagnosis not present

## 2023-03-13 ENCOUNTER — Ambulatory Visit (INDEPENDENT_AMBULATORY_CARE_PROVIDER_SITE_OTHER): Payer: 59 | Admitting: Psychology

## 2023-03-13 DIAGNOSIS — F331 Major depressive disorder, recurrent, moderate: Secondary | ICD-10-CM | POA: Diagnosis not present

## 2023-03-13 NOTE — Progress Notes (Signed)
[facility] Counselor/Therapist Progress Note  Patient ID: Kendra Allen, MRN: [id number],    Date: 03/13/2023  Time Spent: 5:00pm-5:45pm   45 minutes    Treatment Type: Individual Therapy  Reported Symptoms: stress   Mental Status Exam: Appearance:  Casual     Behavior: Appropriate  Motor: Normal  Speech/Language:  Normal Rate  Affect: Appropriate  Mood: normal  Thought process: normal  Thought content:   WNL  Sensory/Perceptual disturbances:   WNL  Orientation: oriented to person, place, time/date, and situation  Attention: Good  Concentration: Good  Memory: WNL  Fund of knowledge:  Good  Insight:   Good  Judgment:  Good  Impulse Control: Good   Risk Assessment: Danger to Self:  No Self-injurious Behavior: No Danger to Others: No Duty to Warn:no Physical Aggression / Violence:No  Access to Firearms a concern: No  Gang Involvement:No   Subjective: Pt present for face-to-face individual therapy via video.  Pt consents to telehealth video session and is aware of limitations and benefits of virtual sessions. Location of pt: home Location of therapist: home office.  Pt talked about work.  She is working full time and states things are going well overall.  They are short staffed at work so that can create some stress.  Pt plans to stay at the job for at least a year but does not know what her career path will be.  She feels like she is not making enough money.  At times pt feels down about having to go to work but she adjusts by using thought reframing and tells herself she "gets to go to work".   Pt talked about seeing her psychiatrist recently.  She was prescribed a medication to help with drowsiness and focusing.   Pt states it is helping her.   Pt states her mood has been ok.   She is doing things with friends and is planning a trip to [place] in February so she will have something to look forward to.   Pt has not made progress toward her goal  to exercise.   Addressed how she can fit exercise into her work schedule.   Encouraged pt to increase self care. Provided supportive therapy.    Interventions: Cognitive Behavioral Therapy and Insight-Oriented  Diagnosis: F33.1  Plan of Care: Recommend ongoing therapy.  Pt participated in setting treatment goals.   She wants to improve coping skills.  Plan to meet every two weeks.    Treatment Plan  (treatment plan target date: 07/12/2023). Client Abilities/Strengths  Pt is bright and motivated for therapy. Client Treatment Preferences  Individual therapy.  Client Statement of Needs  Improve coping skills.  Symptoms  Depressed or irritable mood.  Diminished interest in or enjoyment of activities.  Problems Addressed  Unipolar Depression Goals 1. Alleviate depressive symptoms and return to previous level of effective functioning. 2. Appropriately grieve the loss in order to normalize mood and to return to previously adaptive level of functioning. Objective Learn and implement behavioral strategies to overcome depression. Target Date: 2023-07-12 Frequency: Biweekly  Progress: 40 Modality: individual  Related Interventions Engage the client in "behavioral activation," increasing his/her activity level and contact with sources of reward, while identifying processes that inhibit activation.  Use behavioral techniques such as instruction, rehearsal, role-playing, role reversal, as needed, to facilitate activity in the client's daily life; reinforce success. Assist the client in developing skills that increase the likelihood of deriving pleasure from behavioral activation (e.g., assertiveness skills, developing an  exercise plan, less internal/more external focus, increased social involvement); reinforce success. Objective Identify important people in life, past and present, and describe the quality, good and poor, of those relationships. Target Date: 2023-07-12 Frequency: Biweekly   Progress: 40 Modality: individual  Related Interventions Conduct Interpersonal Therapy beginning with the assessment of the client's "interpersonal inventory" of important past and present relationships; develop a case formulation linking depression to grief, interpersonal role disputes, role transitions, and/or interpersonal deficits). Objective Learn and implement problem-solving and decision-making skills. Target Date: 2023-07-12 Frequency: Biweekly  Progress: 40 Modality: individual  Related Interventions Conduct Problem-Solving Therapy using techniques such as psychoeducation, modeling, and role-playing to teach client problem-solving skills (i.e., defining a problem specifically, generating possible solutions, evaluating the pros and cons of each solution, selecting and implementing a plan of action, evaluating the efficacy of the plan, accepting or revising the plan); role-play application of the problem-solving skill to a real life issue. Encourage in the client the development of a positive problem orientation in which problems and solving them are viewed as a natural part of life and not something to be feared, despaired, or avoided. 3. Develop healthy interpersonal relationships that lead to the alleviation and help prevent the relapse of depression. 4. Develop healthy thinking patterns and beliefs about self, others, and the world that lead to the alleviation and help prevent the relapse of depression. 5. Recognize, accept, and cope with feelings of depression. Diagnosis F33.1  Conditions For Discharge Achievement of treatment goals and objectives   Kendra Fick, LCSW

## 2023-03-14 DIAGNOSIS — N809 Endometriosis, unspecified: Secondary | ICD-10-CM | POA: Diagnosis not present

## 2023-03-23 DIAGNOSIS — Z6841 Body Mass Index (BMI) 40.0 and over, adult: Secondary | ICD-10-CM | POA: Diagnosis not present

## 2023-03-23 DIAGNOSIS — H66003 Acute suppurative otitis media without spontaneous rupture of ear drum, bilateral: Secondary | ICD-10-CM | POA: Diagnosis not present

## 2023-03-23 DIAGNOSIS — J0100 Acute maxillary sinusitis, unspecified: Secondary | ICD-10-CM | POA: Diagnosis not present

## 2023-03-24 DIAGNOSIS — H52223 Regular astigmatism, bilateral: Secondary | ICD-10-CM | POA: Diagnosis not present

## 2023-03-30 DIAGNOSIS — N921 Excessive and frequent menstruation with irregular cycle: Secondary | ICD-10-CM | POA: Diagnosis not present

## 2023-03-30 DIAGNOSIS — N809 Endometriosis, unspecified: Secondary | ICD-10-CM | POA: Diagnosis not present

## 2023-04-02 ENCOUNTER — Other Ambulatory Visit (HOSPITAL_COMMUNITY): Payer: Self-pay

## 2023-04-02 MED ORDER — HYDROXYZINE PAMOATE 25 MG PO CAPS
25.0000 mg | ORAL_CAPSULE | Freq: Every evening | ORAL | 2 refills | Status: DC
  Filled 2023-04-02 – 2023-04-27 (×2): qty 30, 30d supply, fill #0
  Filled 2023-05-27: qty 30, 30d supply, fill #1

## 2023-04-02 MED ORDER — VORTIOXETINE HBR 20 MG PO TABS
20.0000 mg | ORAL_TABLET | Freq: Every day | ORAL | 2 refills | Status: DC
  Filled 2023-04-02: qty 30, 30d supply, fill #0

## 2023-04-02 MED ORDER — CARIPRAZINE HCL 3 MG PO CAPS
3.0000 mg | ORAL_CAPSULE | Freq: Every day | ORAL | 2 refills | Status: DC
  Filled 2023-04-02: qty 30, 30d supply, fill #0

## 2023-04-03 ENCOUNTER — Other Ambulatory Visit (HOSPITAL_COMMUNITY): Payer: Self-pay

## 2023-04-03 ENCOUNTER — Ambulatory Visit (INDEPENDENT_AMBULATORY_CARE_PROVIDER_SITE_OTHER): Payer: 59 | Admitting: Psychology

## 2023-04-03 DIAGNOSIS — F419 Anxiety disorder, unspecified: Secondary | ICD-10-CM | POA: Diagnosis not present

## 2023-04-03 DIAGNOSIS — F331 Major depressive disorder, recurrent, moderate: Secondary | ICD-10-CM

## 2023-04-03 DIAGNOSIS — G4730 Sleep apnea, unspecified: Secondary | ICD-10-CM | POA: Diagnosis not present

## 2023-04-03 MED ORDER — VRAYLAR 3 MG PO CAPS
3.0000 mg | ORAL_CAPSULE | Freq: Every day | ORAL | 2 refills | Status: DC
  Filled 2023-04-03 – 2023-04-27 (×2): qty 30, 30d supply, fill #0
  Filled 2023-05-27: qty 30, 30d supply, fill #1
  Filled 2023-07-02: qty 30, 30d supply, fill #2

## 2023-04-03 MED ORDER — TRINTELLIX 20 MG PO TABS
20.0000 mg | ORAL_TABLET | Freq: Every day | ORAL | 2 refills | Status: DC
  Filled 2023-04-03 – 2023-04-27 (×2): qty 30, 30d supply, fill #0
  Filled 2023-05-27: qty 30, 30d supply, fill #1

## 2023-04-03 MED ORDER — HYDROXYZINE PAMOATE 25 MG PO CAPS
25.0000 mg | ORAL_CAPSULE | Freq: Every evening | ORAL | 2 refills | Status: DC
  Filled 2023-04-03: qty 30, 30d supply, fill #0

## 2023-04-03 MED ORDER — MODAFINIL 200 MG PO TABS
200.0000 mg | ORAL_TABLET | Freq: Every morning | ORAL | 2 refills | Status: DC
Start: 2023-04-03 — End: 2023-08-27
  Filled 2023-04-03 – 2023-04-28 (×2): qty 30, 30d supply, fill #0
  Filled 2023-06-08: qty 30, 30d supply, fill #1
  Filled 2023-07-21: qty 30, 30d supply, fill #2

## 2023-04-03 NOTE — Progress Notes (Signed)
[facility] Counselor/Therapist Progress Note  Patient ID: Kendra Allen, MRN: [id number],    Date: 04/03/2023  Time Spent: 5:00pm-5:45pm   45 minutes    Treatment Type: Individual Therapy  Reported Symptoms: stress   Mental Status Exam: Appearance:  Casual     Behavior: Appropriate  Motor: Normal  Speech/Language:  Normal Rate  Affect: Appropriate  Mood: normal  Thought process: normal  Thought content:   WNL  Sensory/Perceptual disturbances:   WNL  Orientation: oriented to person, place, time/date, and situation  Attention: Good  Concentration: Good  Memory: WNL  Fund of knowledge:  Good  Insight:   Good  Judgment:  Good  Impulse Control: Good   Risk Assessment: Danger to Self:  No Self-injurious Behavior: No Danger to Others: No Duty to Warn:no Physical Aggression / Violence:No  Access to Firearms a concern: No  Gang Involvement:No   Subjective: Pt present for face-to-face individual therapy via video.  Pt consents to telehealth video session and is aware of limitations and benefits of virtual sessions. Location of pt: home Location of therapist: home office.  Pt talked about feeling like she has not made much progress recently and has a lot on her mind.   Pt does not know what she wants to do regarding career.   Pt's current job is not a long term career option she is interested in.   Pt states she still has not figured out how to incorporate exercise into her weekly routine.   Pt states she feels unmotivated and "blah".  Pt feels "stuck" bc things in life aren't moving in the direction she would like.  Pt compares herself to others and then feels badly about herself.   Worked on issues regarding comparing and self esteem.  Pt talked about work.   Pt is getting to work every day and working full time.   Encouraged pt to increase self care. Provided supportive therapy.    Interventions: Cognitive Behavioral Therapy and  Insight-Oriented  Diagnosis: F33.1  Plan of Care: Recommend ongoing therapy.  Pt participated in setting treatment goals.   She wants to improve coping skills.  Plan to meet every two weeks.    Treatment Plan  (treatment plan target date: 07/12/2023). Client Abilities/Strengths  Pt is bright and motivated for therapy. Client Treatment Preferences  Individual therapy.  Client Statement of Needs  Improve coping skills.  Symptoms  Depressed or irritable mood.  Diminished interest in or enjoyment of activities.  Problems Addressed  Unipolar Depression Goals 1. Alleviate depressive symptoms and return to previous level of effective functioning. 2. Appropriately grieve the loss in order to normalize mood and to return to previously adaptive level of functioning. Objective Learn and implement behavioral strategies to overcome depression. Target Date: 2023-07-12 Frequency: Biweekly  Progress: 40 Modality: individual  Related Interventions Engage the client in "behavioral activation," increasing his/her activity level and contact with sources of reward, while identifying processes that inhibit activation.  Use behavioral techniques such as instruction, rehearsal, role-playing, role reversal, as needed, to facilitate activity in the client's daily life; reinforce success. Assist the client in developing skills that increase the likelihood of deriving pleasure from behavioral activation (e.g., assertiveness skills, developing an exercise plan, less internal/more external focus, increased social involvement); reinforce success. Objective Identify important people in life, past and present, and describe the quality, good and poor, of those relationships. Target Date: 2023-07-12 Frequency: Biweekly  Progress: 40 Modality: individual  Related Interventions Conduct Interpersonal Therapy beginning with the  assessment of the client's "interpersonal inventory" of important past and present relationships;  develop a case formulation linking depression to grief, interpersonal role disputes, role transitions, and/or interpersonal deficits). Objective Learn and implement problem-solving and decision-making skills. Target Date: 2023-07-12 Frequency: Biweekly  Progress: 40 Modality: individual  Related Interventions Conduct Problem-Solving Therapy using techniques such as psychoeducation, modeling, and role-playing to teach client problem-solving skills (i.e., defining a problem specifically, generating possible solutions, evaluating the pros and cons of each solution, selecting and implementing a plan of action, evaluating the efficacy of the plan, accepting or revising the plan); role-play application of the problem-solving skill to a real life issue. Encourage in the client the development of a positive problem orientation in which problems and solving them are viewed as a natural part of life and not something to be feared, despaired, or avoided. 3. Develop healthy interpersonal relationships that lead to the alleviation and help prevent the relapse of depression. 4. Develop healthy thinking patterns and beliefs about self, others, and the world that lead to the alleviation and help prevent the relapse of depression. 5. Recognize, accept, and cope with feelings of depression. Diagnosis F33.1  Conditions For Discharge Achievement of treatment goals and objectives   Salomon Fick, LCSW

## 2023-04-16 ENCOUNTER — Other Ambulatory Visit (HOSPITAL_COMMUNITY): Payer: Self-pay

## 2023-04-16 DIAGNOSIS — D259 Leiomyoma of uterus, unspecified: Secondary | ICD-10-CM | POA: Diagnosis not present

## 2023-04-16 DIAGNOSIS — N83202 Unspecified ovarian cyst, left side: Secondary | ICD-10-CM | POA: Diagnosis not present

## 2023-04-16 DIAGNOSIS — L810 Postinflammatory hyperpigmentation: Secondary | ICD-10-CM | POA: Diagnosis not present

## 2023-04-16 DIAGNOSIS — N809 Endometriosis, unspecified: Secondary | ICD-10-CM | POA: Diagnosis not present

## 2023-04-16 DIAGNOSIS — L732 Hidradenitis suppurativa: Secondary | ICD-10-CM | POA: Diagnosis not present

## 2023-04-16 DIAGNOSIS — D251 Intramural leiomyoma of uterus: Secondary | ICD-10-CM | POA: Diagnosis not present

## 2023-04-16 DIAGNOSIS — Z90710 Acquired absence of both cervix and uterus: Secondary | ICD-10-CM | POA: Diagnosis not present

## 2023-04-16 DIAGNOSIS — L700 Acne vulgaris: Secondary | ICD-10-CM | POA: Diagnosis not present

## 2023-04-16 MED ORDER — TRETINOIN 0.025 % EX CREA
1.0000 | TOPICAL_CREAM | Freq: Every evening | CUTANEOUS | 2 refills | Status: DC
  Filled 2023-04-16: qty 45, 30d supply, fill #0

## 2023-04-16 MED ORDER — CLINDAMYCIN PHOSPHATE 1 % EX SOLN
1.0000 | Freq: Two times a day (BID) | CUTANEOUS | 5 refills | Status: DC
  Filled 2023-04-16: qty 30, 30d supply, fill #0

## 2023-04-19 ENCOUNTER — Ambulatory Visit: Payer: 59 | Admitting: Psychology

## 2023-04-19 DIAGNOSIS — F331 Major depressive disorder, recurrent, moderate: Secondary | ICD-10-CM | POA: Diagnosis not present

## 2023-04-19 NOTE — Progress Notes (Signed)
[facility] Counselor/Therapist Progress Note  Patient ID: HALLEE MOTA, MRN: [id number],    Date: 04/19/2023  Time Spent: 5:00pm-5:45pm   45 minutes    Treatment Type: Individual Therapy  Reported Symptoms: stress   Mental Status Exam: Appearance:  Casual     Behavior: Appropriate  Motor: Normal  Speech/Language:  Normal Rate  Affect: Appropriate  Mood: normal  Thought process: normal  Thought content:   WNL  Sensory/Perceptual disturbances:   WNL  Orientation: oriented to person, place, time/date, and situation  Attention: Good  Concentration: Good  Memory: WNL  Fund of knowledge:  Good  Insight:   Good  Judgment:  Good  Impulse Control: Good   Risk Assessment: Danger to Self:  No Self-injurious Behavior: No Danger to Others: No Duty to Warn:no Physical Aggression / Violence:No  Access to Firearms a concern: No  Gang Involvement:No   Subjective: Pt present for face-to-face individual therapy via video.  Pt consents to telehealth video session and is aware of limitations and benefits of virtual sessions. Location of pt: home Location of therapist: home office.  Pt talked about work.  It has been a hectic day today.  Addressed the stress at work.   Worked on [employer], [employer].   Pt talked about her health.   She has a cyst on her ovary and a fibroid.   She does not need surgery which is reassuring to pt.  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   Pt is interested in doing play therapy.   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 Pt went to the gym before work to exercise and going to try going after work later this week and decide what schedule works best for her.  Encouraged pt to increase self care. Provided supportive therapy.    Interventions: Cognitive Behavioral Therapy and Insight-Oriented  Diagnosis: F33.1  Plan of Care: Recommend ongoing therapy.   Pt participated in setting treatment goals.   She wants to improve coping skills.  Plan to meet every two weeks.    Treatment Plan  (treatment plan target date: 07/12/2023). Client Abilities/Strengths  Pt is bright and motivated for therapy. Client Treatment Preferences  Individual therapy.  Client Statement of Needs  Improve coping skills.  Symptoms  Depressed or irritable mood.  Diminished interest in or enjoyment of activities.  Problems Addressed  Unipolar Depression Goals 1. Alleviate depressive symptoms and return to previous level of effective functioning. 2. Appropriately grieve the loss in order to normalize mood and to return to previously adaptive level of functioning. Objective Learn and implement behavioral strategies to overcome depression. Target Date: 2023-07-12 Frequency: Biweekly  Progress: 40 Modality: individual  Related Interventions Engage the client in "behavioral activation," increasing his/her activity level and contact with sources of reward, while identifying processes that inhibit activation.  Use behavioral techniques such as instruction, rehearsal, role-playing, role reversal, as needed, to facilitate activity in the client's daily life; reinforce success. Assist the client in developing skills that increase the likelihood of deriving pleasure from behavioral activation (e.g., assertiveness skills, developing an exercise plan, less internal/more external focus, increased social involvement); reinforce success. Objective Identify important people in life, past and present, and describe the quality, good and poor, of those relationships. Target Date: 2023-07-12 Frequency: Biweekly  Progress: 40 Modality: individual  Related Interventions Conduct Interpersonal Therapy beginning with the assessment of the client's "interpersonal inventory" of  important past and present relationships; develop a case formulation linking depression to grief, interpersonal role  disputes, role transitions, and/or interpersonal deficits). Objective Learn and implement problem-solving and decision-making skills. Target Date: 2023-07-12 Frequency: Biweekly  Progress: 40 Modality: individual  Related Interventions Conduct Problem-Solving Therapy using techniques such as psychoeducation, modeling, and role-playing to teach client problem-solving skills (i.e., defining a problem specifically, generating possible solutions, evaluating the pros and cons of each solution, selecting and implementing a plan of action, evaluating the efficacy of the plan, accepting or revising the plan); role-play application of the problem-solving skill to a real life issue. Encourage in the client the development of a positive problem orientation in which problems and solving them are viewed as a natural part of life and not something to be feared, despaired, or avoided. 3. Develop healthy interpersonal relationships that lead to the alleviation and help prevent the relapse of depression. 4. Develop healthy thinking patterns and beliefs about self, others, and the world that lead to the alleviation and help prevent the relapse of depression. 5. Recognize, accept, and cope with feelings of depression. Diagnosis F33.1  Conditions For Discharge Achievement of treatment goals and objectives   Salomon Fick, LCSW

## 2023-04-27 ENCOUNTER — Other Ambulatory Visit (HOSPITAL_COMMUNITY): Payer: Self-pay

## 2023-04-28 ENCOUNTER — Other Ambulatory Visit (HOSPITAL_COMMUNITY): Payer: Self-pay

## 2023-04-30 ENCOUNTER — Other Ambulatory Visit (HOSPITAL_COMMUNITY): Payer: Self-pay

## 2023-05-07 ENCOUNTER — Ambulatory Visit (INDEPENDENT_AMBULATORY_CARE_PROVIDER_SITE_OTHER): Payer: 59 | Admitting: Psychology

## 2023-05-07 DIAGNOSIS — F331 Major depressive disorder, recurrent, moderate: Secondary | ICD-10-CM | POA: Diagnosis not present

## 2023-05-07 NOTE — Progress Notes (Signed)
[facility] Counselor/Therapist Progress Note  Patient ID: Kendra Allen, MRN: [id number],    Date: 05/07/2023  Time Spent: 5:00pm-5:45pm   45 minutes    Treatment Type: Individual Therapy  Reported Symptoms: stress   Mental Status Exam: Appearance:  Casual     Behavior: Appropriate  Motor: Normal  Speech/Language:  Normal Rate  Affect: Appropriate  Mood: normal  Thought process: normal  Thought content:   WNL  Sensory/Perceptual disturbances:   WNL  Orientation: oriented to person, place, time/date, and situation  Attention: Good  Concentration: Good  Memory: WNL  Fund of knowledge:  Good  Insight:   Good  Judgment:  Good  Impulse Control: Good   Risk Assessment: Danger to Self:  No Self-injurious Behavior: No Danger to Others: No Duty to Warn:no Physical Aggression / Violence:No  Access to Firearms a concern: No  Gang Involvement:No   Subjective: Pt present for face-to-face individual therapy via video.  Pt consents to telehealth video session and is aware of limitations and benefits of virtual sessions. Location of pt: home Location of therapist: home office.  Pt talked about work.  It has been a hectic day today.  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  Addressed the stress at work.   Worked on [employer], dispensing.   Pt talked about her health.  She has been more tired lately.   She is going to have to see a surgeon for a consult about the cyst on her ovary.  Addressed pt's health concerns. Pt is going to apply to a [employer] program in psychology at [facility] [employer] [facility].   She is working on her application.  She feels stress about the application deadline and worries about if she will get in.   Pt talked about the upcoming holidays.  She will be with family for Thanksgiving.   Encouraged pt to increase self care. Provided supportive therapy.    Interventions: Cognitive Behavioral Therapy and Insight-Oriented  Diagnosis: F33.1  Plan of Care: Recommend  ongoing therapy.  Pt participated in setting treatment goals.   She wants to improve coping skills.  Plan to meet every two weeks.    Treatment Plan  (treatment plan target date: 07/12/2023). Client Abilities/Strengths  Pt is bright and motivated for therapy. Client Treatment Preferences  Individual therapy.  Client Statement of Needs  Improve coping skills.  Symptoms  Depressed or irritable mood.  Diminished interest in or enjoyment of activities.  Problems Addressed  Unipolar Depression Goals 1. Alleviate depressive symptoms and return to previous level of effective functioning. 2. Appropriately grieve the loss in order to normalize mood and to return to previously adaptive level of functioning. Objective Learn and implement behavioral strategies to overcome depression. Target Date: 2023-07-12 Frequency: Biweekly  Progress: 40 Modality: individual  Related Interventions Engage the client in "behavioral activation," increasing his/her activity level and contact with sources of reward, while identifying processes that inhibit activation.  Use behavioral techniques such as instruction, rehearsal, role-playing, role reversal, as needed, to facilitate activity in the client's daily life; reinforce success. Assist the client in developing skills that increase the likelihood of deriving pleasure from behavioral activation (e.g., assertiveness skills, developing an exercise plan, less internal/more external focus, increased social involvement); reinforce success. Objective Identify important people in life, past and present, and describe the quality, good and poor, of those relationships. Target Date: 2023-07-12 Frequency: Biweekly  Progress: 40 Modality: individual  Related Interventions Conduct Interpersonal Therapy beginning with the assessment of the client's "interpersonal  inventory" of important past and present relationships; develop a case formulation linking depression to grief,  interpersonal role disputes, role transitions, and/or interpersonal deficits). Objective Learn and implement problem-solving and decision-making skills. Target Date: 2023-07-12 Frequency: Biweekly  Progress: 40 Modality: individual  Related Interventions Conduct Problem-Solving Therapy using techniques such as psychoeducation, modeling, and role-playing to teach client problem-solving skills (i.e., defining a problem specifically, generating possible solutions, evaluating the pros and cons of each solution, selecting and implementing a plan of action, evaluating the efficacy of the plan, accepting or revising the plan); role-play application of the problem-solving skill to a real life issue. Encourage in the client the development of a positive problem orientation in which problems and solving them are viewed as a natural part of life and not something to be feared, despaired, or avoided. 3. Develop healthy interpersonal relationships that lead to the alleviation and help prevent the relapse of depression. 4. Develop healthy thinking patterns and beliefs about self, others, and the world that lead to the alleviation and help prevent the relapse of depression. 5. Recognize, accept, and cope with feelings of depression. Diagnosis F33.1  Conditions For Discharge Achievement of treatment goals and objectives   Salomon Fick, LCSW

## 2023-05-08 ENCOUNTER — Other Ambulatory Visit (HOSPITAL_COMMUNITY): Payer: Self-pay

## 2023-05-08 MED ORDER — KETOROLAC TROMETHAMINE 10 MG PO TABS
10.0000 mg | ORAL_TABLET | Freq: Four times a day (QID) | ORAL | 0 refills | Status: DC | PRN
  Filled 2023-05-08: qty 20, 5d supply, fill #0

## 2023-05-26 ENCOUNTER — Encounter: Payer: Self-pay | Admitting: Obstetrics and Gynecology

## 2023-05-26 ENCOUNTER — Encounter: Payer: Self-pay | Admitting: Internal Medicine

## 2023-05-28 NOTE — Telephone Encounter (Signed)
Spoke with patient. Confirmed OV for 05/31/23 at 1430, not 12/9. Patient states she confirmed appt on e-check in and ok with 12/12 at 1430.   Patient appreciative of call.   Routing to provider for final review. Patient is agreeable to disposition. Will close encounter.

## 2023-05-31 ENCOUNTER — Encounter: Payer: Self-pay | Admitting: Obstetrics and Gynecology

## 2023-05-31 ENCOUNTER — Ambulatory Visit: Payer: 59 | Admitting: Obstetrics and Gynecology

## 2023-05-31 VITALS — BP 108/78 | HR 99 | Ht 66.0 in | Wt 279.0 lb

## 2023-05-31 DIAGNOSIS — Z9189 Other specified personal risk factors, not elsewhere classified: Secondary | ICD-10-CM | POA: Diagnosis not present

## 2023-05-31 DIAGNOSIS — N6311 Unspecified lump in the right breast, upper outer quadrant: Secondary | ICD-10-CM | POA: Diagnosis not present

## 2023-05-31 NOTE — Assessment & Plan Note (Signed)
She has a 1/2 sister with breast cancer at 28.  High risk of breast cancer of 20.3%.  Negative genetic testing in 2022.  The Genetic counselor recommended mammograms at 31 and breast MRI's at 26. Patient has not had a MRI this year, ordered in setting of right breast mass. Right breast US also ordered. Do not recommend MMG at this time.

## 2023-05-31 NOTE — Progress Notes (Signed)
27 y.o. G0P0000 female with PCOS, HS, stage IV endometriosis (followed at [facility]), high risk of breast cancer (20%, s/p genetic counseling 2022, negative BRCA testing) here for right breast lump.  Pt states she has a small (pea sized) lump in R breast, noticed it 4 days ago. Denies pain or soreness to touch.   She has a 1/2 sister with breast cancer at 28. She has an elevated risk of breast cancer of 20.3%. Negative genetic testing. The Genetic counselor recommended mammograms at 31 and breast MRI's at 26.  Patient's last menstrual period was 05/21/2023 (approximate).   Birth control: none, poorly tolerated, on aygestin Last mammogram: 08/10/22 Benign  GYN HISTORY: PCOS HS Stage IV endometriosis (followed at [facility]) High risk of breast cancer (20%, s/p genetic counseling 2022, negative BRCA testing)  OB History  Gravida Para Term Preterm AB Living  0 0 0 0 0 0  SAB IAB Ectopic Multiple Live Births  0 0 0 0 0    Past Medical History:  Diagnosis Date   Anemia    Anxiety    Chest pain    Constipation    Depression    Endometriosis of pelvis    Family history of breast cancer    Family history of colon cancer    Family history of ovarian cancer    Family history of prostate cancer    Hidradenitis suppurativa    PCOS (polycystic ovarian syndrome)    Prediabetes    Sleep apnea    Vitamin D deficiency     Past Surgical History:  Procedure Laterality Date   CERVICAL POLYPECTOMY     DILATATION & CURETTAGE/HYSTEROSCOPY WITH MYOSURE N/A 11/21/2016   Procedure: DILATATION & CURETTAGE/HYSTEROSCOPY WITH MYOSURE;  Surgeon: Maxie Better, MD;  Location: [facility];  Service: Gynecology;  [name];   ROBOTIC ASSISTED LAPAROSCOPIC OVARIAN CYSTECTOMY N/A 12/30/2020   Procedure: XI ROBOTIC ASSISTED LAPAROSCOPIC RIGHT SALPINGO-OOPHORECTOMY WITH PELVIC WASHINGS;  Surgeon: Carver Fila, MD;  [name];  Service: Gynecology;  [name];   WISDOM TOOTH  EXTRACTION      Current Outpatient Medications on File Prior to Visit  Medication Sig Dispense Refill   cariprazine (VRAYLAR) 3 MG capsule Take 1 capsule (3 mg total) by mouth daily. 30 capsule 2   clindamycin (CLEOCIN T) 1 % external solution Apply 1 Application topically 1-2 times daily. 30 mL 5   hydrOXYzine (VISTARIL) 25 MG capsule TAKE 1 CAPSULE BY MOUTH 2 TIMES DAILY AS NEEDED FOR ANXIETY. 180 capsule 1   modafinil (PROVIGIL) 200 MG tablet Take 1 tablet (200 mg total) by mouth in the morning. 30 tablet 2   norethindrone (AYGESTIN) 5 MG tablet Take 2 tablets by mouth daily. Take three tabs daily     tretinoin (RETIN-A) 0.025 % cream Apply a pea sized amount to face topically every evening. 45 g 2   TRINTELLIX 20 MG TABS tablet Take 20 mg by mouth daily.     VITAMIN D PO Take by mouth.     No current facility-administered medications on file prior to visit.    Allergies  Allergen Reactions   Doxycycline     Upset stomach   Sulfamethoxazole-Trimethoprim     upset stomach      PE Today's Vitals   05/31/23 1416  BP: 108/78  Pulse: 99  SpO2: 98%  Weight: 279 lb (126.6 kg)  Height: 5\' 6"  (1.676 m)   Body mass index is 45.03 kg/m.  Physical Exam  Vitals reviewed.  Constitutional:      General: She is not in acute distress.    Appearance: Normal appearance.  HENT:     Head: Normocephalic and atraumatic.     Nose: Nose normal.  Eyes:     Extraocular Movements: Extraocular movements intact.     Conjunctiva/sclera: Conjunctivae normal.  Pulmonary:     Effort: Pulmonary effort is normal.  Chest:  Breasts:    Right: Normal. No mass, nipple discharge, skin change or tenderness.     Left: Normal. No mass, nipple discharge, skin change or tenderness.       Comments:  right breast mass @ 11:00, 7cm from nipple, 5mm size, round Musculoskeletal:        General: Normal range of motion.     Cervical back: Normal range of motion.  Lymphadenopathy:     Upper Body:      Right upper body: No axillary adenopathy.     Left upper body: No axillary adenopathy.  Neurological:     General: No focal deficit present.     Mental Status: She is alert.  Psychiatric:        Mood and Affect: Mood normal.        Behavior: Behavior normal.       Assessment and Plan:        Mass of upper outer quadrant of right breast -     MR BREAST BILATERAL W WO CONTRAST INC CAD; Future -     [place] LIMITED ULTRASOUND INCLUDING AXILLA RIGHT BREAST; Future  At high risk for breast cancer Assessment & Plan: She has a 1/2 sister with breast cancer at 30.  High risk of breast cancer of 20.3%.  Negative genetic testing in 2022.  The Genetic counselor recommended mammograms at 31 and breast MRI's at 26. Patient has not had a MRI this year, ordered in setting of right breast mass. Right breast US also ordered. Do not recommend MMG at this time.  Orders: -     MR BREAST BILATERAL W WO CONTRAST INC CAD; Future    All questions answered. RTO for yearly in Jan 2025. Rosalyn Gess, MD

## 2023-06-01 NOTE — Progress Notes (Signed)
Order placed in EMR for breast MRI to be done @ [facility]-they will contact her to schedule.  Paper order filled out for [facility] and placed on provider's desk for authorization.   Per conversation with Dr. Kennith Center; "Pt should continue to have all imaging within the same location."  Order placed for Breast US to be performed @ [facility].   Pt notified and voiced understanding. Already scheduled for MRI on 07/08/2023-pt inquired as to why so far out? Pt notified that [facility] services a very wide area so they can book up fast (pt encouraged to call on a daily basis to check for cancellations since they do not have a cancellation list).  Pt reported that she will call and schedule [place] at her earliest convenience, she was provided w/ [facility] contact info and options to reach dx breast imaging.   She voiced appreciation for call and information.

## 2023-06-01 NOTE — Addendum Note (Signed)
Addended by: Jodelle Red D on: 06/01/2023 10:04 AM   Modules accepted: Orders

## 2023-06-05 NOTE — Progress Notes (Signed)
Pt scheduled for breast MRI @ [facility] on 07/08/2023-msg sent to [facility] for PA.  Per appt notes for breast US: "06/01/2023 1ST ATTEMPT/ PT HAS IMAGES FROM [facility], AWARE OF ROI-AC"  Pt not yet scheduled for Breast US w/ [facility].

## 2023-06-08 ENCOUNTER — Other Ambulatory Visit: Payer: Self-pay

## 2023-06-14 NOTE — Progress Notes (Signed)
I followed up with the pt to see if she needed any assistance with getting her prior images from [facility] sent to [facility] so she can get scheduled for her breast US and she reported that this should be in progress now but if she does need any assistance she will let us know and voiced appreciation for the call. Will leave encounter opened for f/u.

## 2023-06-18 ENCOUNTER — Ambulatory Visit (INDEPENDENT_AMBULATORY_CARE_PROVIDER_SITE_OTHER): Payer: 59 | Admitting: Psychology

## 2023-06-18 DIAGNOSIS — F331 Major depressive disorder, recurrent, moderate: Secondary | ICD-10-CM

## 2023-06-18 NOTE — Progress Notes (Signed)
[facility] Counselor/Therapist Progress Note  Patient ID: BLONNIE CONSTANTINIDES, MRN: [id number],    Date: 06/18/2023  Time Spent: 5:00pm-5:45pm   45 minutes    Treatment Type: Individual Therapy  Reported Symptoms: stress   Mental Status Exam: Appearance:  Casual     Behavior: Appropriate  Motor: Normal  Speech/Language:  Normal Rate  Affect: Appropriate  Mood: normal  Thought process: normal  Thought content:   WNL  Sensory/Perceptual disturbances:   WNL  Orientation: oriented to person, place, time/date, and situation  Attention: Good  Concentration: Good  Memory: WNL  Fund of knowledge:  Good  Insight:   Good  Judgment:  Good  Impulse Control: Good   Risk Assessment: Danger to Self:  No Self-injurious Behavior: No Danger to Others: No Duty to Warn:no Physical Aggression / Violence:No  Access to Firearms a concern: No  Gang Involvement:No   Subjective: Pt present for face-to-face individual therapy via video.  Pt consents to telehealth video session and is aware of limitations and benefits of virtual sessions. Location of pt: home Location of therapist: home office.  Pt talked about her health.   She has a lump on her breast that is concerning.   Her sister has had breast cancer so pt is checked regularly.  Pt will have on MRI on Jan. 19th.    Pt also has a fibroid that may need to be removed.  Addressed pt's health worries.   She is trying to not think about worst case scenarios.   Worked on present moment mindfulness. Pt talked about work.   It has been very busy.   She got in her graduate school application and is waiting to hear about acceptance.  She hopes to hear in April.   Pt is planning time with friends. Encouraged pt to increase self care. Provided supportive therapy.    Interventions: Cognitive Behavioral Therapy and Insight-Oriented  Diagnosis: F33.1  Plan of Care: Recommend ongoing therapy.  Pt participated in setting treatment goals.    She wants to improve coping skills.  Plan to meet every two weeks.    Treatment Plan  (treatment plan target date: 07/12/2023). Client Abilities/Strengths  Pt is bright and motivated for therapy. Client Treatment Preferences  Individual therapy.  Client Statement of Needs  Improve coping skills.  Symptoms  Depressed or irritable mood.  Diminished interest in or enjoyment of activities.  Problems Addressed  Unipolar Depression Goals 1. Alleviate depressive symptoms and return to previous level of effective functioning. 2. Appropriately grieve the loss in order to normalize mood and to return to previously adaptive level of functioning. Objective Learn and implement behavioral strategies to overcome depression. Target Date: 2023-07-12 Frequency: Biweekly  Progress: 40 Modality: individual  Related Interventions Engage the client in "behavioral activation," increasing his/her activity level and contact with sources of reward, while identifying processes that inhibit activation.  Use behavioral techniques such as instruction, rehearsal, role-playing, role reversal, as needed, to facilitate activity in the client's daily life; reinforce success. Assist the client in developing skills that increase the likelihood of deriving pleasure from behavioral activation (e.g., assertiveness skills, developing an exercise plan, less internal/more external focus, increased social involvement); reinforce success. Objective Identify important people in life, past and present, and describe the quality, good and poor, of those relationships. Target Date: 2023-07-12 Frequency: Biweekly  Progress: 40 Modality: individual  Related Interventions Conduct Interpersonal Therapy beginning with the assessment of the client's "interpersonal inventory" of important past and present relationships; develop a  case formulation linking depression to grief, interpersonal role disputes, role transitions, and/or interpersonal  deficits). Objective Learn and implement problem-solving and decision-making skills. Target Date: 2023-07-12 Frequency: Biweekly  Progress: 40 Modality: individual  Related Interventions Conduct Problem-Solving Therapy using techniques such as psychoeducation, modeling, and role-playing to teach client problem-solving skills (i.e., defining a problem specifically, generating possible solutions, evaluating the pros and cons of each solution, selecting and implementing a plan of action, evaluating the efficacy of the plan, accepting or revising the plan); role-play application of the problem-solving skill to a real life issue. Encourage in the client the development of a positive problem orientation in which problems and solving them are viewed as a natural part of life and not something to be feared, despaired, or avoided. 3. Develop healthy interpersonal relationships that lead to the alleviation and help prevent the relapse of depression. 4. Develop healthy thinking patterns and beliefs about self, others, and the world that lead to the alleviation and help prevent the relapse of depression. 5. Recognize, accept, and cope with feelings of depression. Diagnosis F33.1  Conditions For Discharge Achievement of treatment goals and objectives   Salomon Fick, LCSW

## 2023-06-19 ENCOUNTER — Encounter: Payer: Self-pay | Admitting: Obstetrics and Gynecology

## 2023-06-19 DIAGNOSIS — D219 Benign neoplasm of connective and other soft tissue, unspecified: Secondary | ICD-10-CM | POA: Diagnosis not present

## 2023-06-27 ENCOUNTER — Other Ambulatory Visit (HOSPITAL_COMMUNITY): Payer: Self-pay

## 2023-06-28 ENCOUNTER — Other Ambulatory Visit (HOSPITAL_COMMUNITY): Payer: Self-pay

## 2023-06-29 ENCOUNTER — Other Ambulatory Visit (HOSPITAL_COMMUNITY): Payer: Self-pay

## 2023-06-29 MED ORDER — NORETHINDRONE ACETATE 5 MG PO TABS
15.0000 mg | ORAL_TABLET | Freq: Every day | ORAL | 1 refills | Status: DC
  Filled 2023-06-29 – 2023-07-04 (×3): qty 270, 90d supply, fill #0

## 2023-07-03 ENCOUNTER — Other Ambulatory Visit (HOSPITAL_COMMUNITY): Payer: Self-pay

## 2023-07-03 ENCOUNTER — Ambulatory Visit: Payer: 59 | Admitting: Psychology

## 2023-07-03 DIAGNOSIS — L732 Hidradenitis suppurativa: Secondary | ICD-10-CM | POA: Diagnosis not present

## 2023-07-03 DIAGNOSIS — F331 Major depressive disorder, recurrent, moderate: Secondary | ICD-10-CM | POA: Diagnosis not present

## 2023-07-03 DIAGNOSIS — L700 Acne vulgaris: Secondary | ICD-10-CM | POA: Diagnosis not present

## 2023-07-03 DIAGNOSIS — L810 Postinflammatory hyperpigmentation: Secondary | ICD-10-CM | POA: Diagnosis not present

## 2023-07-03 DIAGNOSIS — F419 Anxiety disorder, unspecified: Secondary | ICD-10-CM | POA: Diagnosis not present

## 2023-07-03 MED ORDER — VRAYLAR 3 MG PO CAPS
3.0000 mg | ORAL_CAPSULE | Freq: Every day | ORAL | 2 refills | Status: DC
  Filled 2023-07-03: qty 30, 30d supply, fill #0

## 2023-07-03 MED ORDER — TRINTELLIX 20 MG PO TABS
20.0000 mg | ORAL_TABLET | Freq: Every day | ORAL | 2 refills | Status: DC
  Filled 2023-07-03: qty 30, 30d supply, fill #0
  Filled 2023-09-25: qty 30, 30d supply, fill #1

## 2023-07-03 MED ORDER — SPIRONOLACTONE 100 MG PO TABS
100.0000 mg | ORAL_TABLET | Freq: Every evening | ORAL | 2 refills | Status: DC
  Filled 2023-07-03: qty 30, 30d supply, fill #0
  Filled 2023-08-01: qty 30, 30d supply, fill #1
  Filled 2023-09-02: qty 30, 30d supply, fill #2

## 2023-07-03 MED ORDER — DOXYCYCLINE HYCLATE 100 MG PO TABS
100.0000 mg | ORAL_TABLET | Freq: Two times a day (BID) | ORAL | 0 refills | Status: AC
  Filled 2023-07-03: qty 28, 14d supply, fill #0

## 2023-07-03 MED ORDER — MODAFINIL 200 MG PO TABS: 200.0000 mg | ORAL_TABLET | Freq: Every morning | ORAL | 2 refills | Status: DC

## 2023-07-03 NOTE — Progress Notes (Signed)
 [facility] Counselor/Therapist Progress Note  Patient ID: Kendra Allen, MRN: [id number],    Date: 07/03/2023  Time Spent: 5:00pm-5:45pm   45 minutes    Treatment Type: Individual Therapy  Reported Symptoms: stress   Mental Status Exam: Appearance:  Casual     Behavior: Appropriate  Motor: Normal  Speech/Language:  Normal Rate  Affect: Appropriate  Mood: normal  Thought process: normal  Thought content:   WNL  Sensory/Perceptual disturbances:   WNL  Orientation: oriented to person, place, time/date, and situation  Attention: Good  Concentration: Good  Memory: WNL  Fund of knowledge:  Good  Insight:   Good  Judgment:  Good  Impulse Control: Good   Risk Assessment: Danger to Self:  No Self-injurious Behavior: No Danger to Others: No Duty to Warn:no Physical Aggression / Violence:No  Access to Firearms a concern: No  Gang Involvement:No   Subjective: Pt present for face-to-face individual therapy via video.  Pt consents to telehealth video session and is aware of limitations and benefits of virtual sessions. Location of pt: home Location of therapist: home office.  Pt talked about having her psychiatry appointment today.   She is on the same medications that she has been on for 2 years and they are working well for her.  Pt's mood has been stable overall.   Pt states she has been doing ok but the past two days she has felt more irritated.  Addressed what has triggered that.   Being on the phones at work has been frustrating for her.   Pt is trying to be patient at work but it can be difficult.   She also felt irritated about having to be inside this past weekend bc of the snow.  Worked on coping strategies.   Addressed how pt can plan some things to look forward to.   Pt talked about her health.   Pt does not have to have the fibroid removed.   She just has to have an ultrasound every 6 months.  She has a lump on her breast that is concerning. Pt will have  on MRI on Jan. 19th.   Addressed pt's health worries.   She is trying to not think about worst case scenarios.   Worked on present moment mindfulness. Pt talked about her vocational plans.   She wants to go to graduate school but her mother is not supportive of it so that is impacting her.   Pt does not feel like she has room to dream.  She is thinking about just being practical and finding a job that is enough money but not looking for happiness.   Addressed pt's thoughts and feelings.   Pt states she feels stuck lately bc her life is not where she wants it to be.   Addressed the issues and helped pt process her thoughts and feelings.   Encouraged pt to increase self care. Provided supportive therapy.    Interventions: Cognitive Behavioral Therapy and Insight-Oriented  Diagnosis: F33.1  Plan of Care: Recommend ongoing therapy.  Pt participated in setting treatment goals.   She wants to improve coping skills.  Plan to meet every two weeks.    Treatment Plan  (treatment plan target date: 07/12/2023). Client Abilities/Strengths  Pt is bright and motivated for therapy. Client Treatment Preferences  Individual therapy.  Client Statement of Needs  Improve coping skills.  Symptoms  Depressed or irritable mood.  Diminished interest in or enjoyment of activities.  Problems Addressed  Unipolar  Depression Goals 1. Alleviate depressive symptoms and return to previous level of effective functioning. 2. Appropriately grieve the loss in order to normalize mood and to return to previously adaptive level of functioning. Objective Learn and implement behavioral strategies to overcome depression. Target Date: 2023-07-12 Frequency: Biweekly  Progress: 40 Modality: individual  Related Interventions Engage the client in behavioral activation, increasing his/her activity level and contact with sources of reward, while identifying processes that inhibit activation.  Use behavioral techniques such as  instruction, rehearsal, role-playing, role reversal, as needed, to facilitate activity in the client's daily life; reinforce success. Assist the client in developing skills that increase the likelihood of deriving pleasure from behavioral activation (e.g., assertiveness skills, developing an exercise plan, less internal/more external focus, increased social involvement); reinforce success. Objective Identify important people in life, past and present, and describe the quality, good and poor, of those relationships. Target Date: 2023-07-12 Frequency: Biweekly  Progress: 40 Modality: individual  Related Interventions Conduct Interpersonal Therapy beginning with the assessment of the client's interpersonal inventory of important past and present relationships; develop a case formulation linking depression to grief, interpersonal role disputes, role transitions, and/or interpersonal deficits). Objective Learn and implement problem-solving and decision-making skills. Target Date: 2023-07-12 Frequency: Biweekly  Progress: 40 Modality: individual  Related Interventions Conduct Problem-Solving Therapy using techniques such as psychoeducation, modeling, and role-playing to teach client problem-solving skills (i.e., defining a problem specifically, generating possible solutions, evaluating the pros and cons of each solution, selecting and implementing a plan of action, evaluating the efficacy of the plan, accepting or revising the plan); role-play application of the problem-solving skill to a real life issue. Encourage in the client the development of a positive problem orientation in which problems and solving them are viewed as a natural part of life and not something to be feared, despaired, or avoided. 3. Develop healthy interpersonal relationships that lead to the alleviation and help prevent the relapse of depression. 4. Develop healthy thinking patterns and beliefs about self, others, and the world  that lead to the alleviation and help prevent the relapse of depression. 5. Recognize, accept, and cope with feelings of depression. Diagnosis F33.1  Conditions For Discharge Achievement of treatment goals and objectives   Veva Alma, LCSW

## 2023-07-04 ENCOUNTER — Other Ambulatory Visit (HOSPITAL_COMMUNITY): Payer: Self-pay

## 2023-07-04 ENCOUNTER — Other Ambulatory Visit: Payer: Self-pay

## 2023-07-06 NOTE — Progress Notes (Signed)
Pt now scheduled for b/l breast MRI on 07/08/2023 and breast US on 07/16/2023. Routing to the provider for final review.

## 2023-07-08 ENCOUNTER — Ambulatory Visit
Admission: RE | Admit: 2023-07-08 | Discharge: 2023-07-08 | Disposition: A | Discharge status: Home / Self Care | Payer: 59 | Source: Ambulatory Visit | Attending: Obstetrics and Gynecology | Admitting: Obstetrics and Gynecology

## 2023-07-08 DIAGNOSIS — N6311 Unspecified lump in the right breast, upper outer quadrant: Secondary | ICD-10-CM

## 2023-07-08 DIAGNOSIS — Z1239 Encounter for other screening for malignant neoplasm of breast: Secondary | ICD-10-CM | POA: Diagnosis not present

## 2023-07-08 DIAGNOSIS — Z9189 Other specified personal risk factors, not elsewhere classified: Secondary | ICD-10-CM

## 2023-07-08 MED ORDER — GADOPICLENOL 0.5 MMOL/ML IV SOLN
10.0000 mL | Freq: Once | INTRAVENOUS | Status: AC | PRN
  Administered 2023-07-08: 10 mL via INTRAVENOUS

## 2023-07-09 ENCOUNTER — Other Ambulatory Visit (HOSPITAL_COMMUNITY)
Admission: RE | Admit: 2023-07-09 | Discharge: 2023-07-09 | Disposition: A | Discharge status: Home / Self Care | Payer: 59 | Source: Ambulatory Visit | Attending: Obstetrics and Gynecology | Admitting: Obstetrics and Gynecology

## 2023-07-09 ENCOUNTER — Encounter: Payer: Self-pay | Admitting: Obstetrics and Gynecology

## 2023-07-09 ENCOUNTER — Ambulatory Visit (INDEPENDENT_AMBULATORY_CARE_PROVIDER_SITE_OTHER): Payer: 59 | Admitting: Obstetrics and Gynecology

## 2023-07-09 VITALS — BP 112/76 | HR 87 | Ht 66.0 in | Wt 279.0 lb

## 2023-07-09 DIAGNOSIS — N809 Endometriosis, unspecified: Secondary | ICD-10-CM

## 2023-07-09 DIAGNOSIS — Z1151 Encounter for screening for human papillomavirus (HPV): Secondary | ICD-10-CM | POA: Insufficient documentation

## 2023-07-09 DIAGNOSIS — E66813 Obesity, class 3: Secondary | ICD-10-CM

## 2023-07-09 DIAGNOSIS — Z01419 Encounter for gynecological examination (general) (routine) without abnormal findings: Secondary | ICD-10-CM | POA: Diagnosis not present

## 2023-07-09 DIAGNOSIS — Z9189 Other specified personal risk factors, not elsewhere classified: Secondary | ICD-10-CM

## 2023-07-09 DIAGNOSIS — Z124 Encounter for screening for malignant neoplasm of cervix: Secondary | ICD-10-CM | POA: Diagnosis present

## 2023-07-09 DIAGNOSIS — L732 Hidradenitis suppurativa: Secondary | ICD-10-CM

## 2023-07-09 DIAGNOSIS — N6311 Unspecified lump in the right breast, upper outer quadrant: Secondary | ICD-10-CM

## 2023-07-09 DIAGNOSIS — D252 Subserosal leiomyoma of uterus: Secondary | ICD-10-CM

## 2023-07-09 DIAGNOSIS — N970 Female infertility associated with anovulation: Secondary | ICD-10-CM | POA: Diagnosis not present

## 2023-07-09 DIAGNOSIS — Z1331 Encounter for screening for depression: Secondary | ICD-10-CM | POA: Diagnosis not present

## 2023-07-09 DIAGNOSIS — R7303 Prediabetes: Secondary | ICD-10-CM | POA: Diagnosis not present

## 2023-07-09 DIAGNOSIS — E282 Polycystic ovarian syndrome: Secondary | ICD-10-CM

## 2023-07-09 DIAGNOSIS — Z6841 Body Mass Index (BMI) 40.0 and over, adult: Secondary | ICD-10-CM

## 2023-07-09 NOTE — Assessment & Plan Note (Signed)
Continue management with [facility]

## 2023-07-09 NOTE — Patient Instructions (Signed)

## 2023-07-09 NOTE — Assessment & Plan Note (Addendum)
She has a 1/2 sister with breast cancer at 32.  High risk of breast cancer of 20.3%.  Negative genetic testing in 2022.  The Genetic counselor recommended mammograms at 31 and breast MRI's at 26. MRI results pending. Unable to palpate right right breast mass on exam today

## 2023-07-09 NOTE — Assessment & Plan Note (Signed)
A1c 6.3 and lipids abnormal with PCP this year Normal blood pressure today Encouraged healthy diet and exercise. RD referral placed with patient agreement

## 2023-07-09 NOTE — Progress Notes (Signed)
28 y.o. G0P0000 female with PCOS, HS (followed by Derm, Dr. Chrissie Noa), stage IV endometriosis (followed at [facility] ([facility]Rh), s/p RA-RSO, myomectomy with Gyn ONC in 2022), AUB-F (on Aygestin), high risk of breast cancer (20%, s/p genetic counseling 2022, negative BRCA testing, recommended mammograms at 31 and breast MRI's at 26), morbid obesity here for annual exam. Single.  Works Psychologist, sport and exercise at [employer].   No LMP recorded. (Menstrual status: Oral contraceptives).   Menarche at 11 years.   Pt states she had a MRI on her breast yesterday.  On doxycycline for HS flare.  At 05/31/2023 appointment, she noted: "Pt states she has a small (pea sized) lump in R breast, noticed it 4 days ago. Denies pain or soreness to touch.    She has a 1/2 sister with breast cancer at 69. She has an elevated risk of breast cancer of 20.3%. Negative genetic testing. The Genetic counselor recommended mammograms at 31 and breast MRI's at 26."  AUB, fibroids, and endometriosis on Aygestin 15 g daily managed by [facility], last appointment 06/19/2023.  04/16/2023 ultrasound revealed: Left anterior subserosal fibroid measuring up to 5.0 cm.  Notes report: has not tolerated combined OCPs well in the past. Previously did not tolerate OCPs and NuvaRing due to mood side effects.   Abnormal bleeding: None, doing well on Aygestin Pelvic discharge or pain: None, completed pelvic floor physical therapy after robotic procedure in 2022 Breast mass, nipple discharge or skin changes : Intermittently notices pea-sized area in right breast, unable to palpate today, nontender, no nipple discharge. Birth control: None Last PAP:     Component Value Date/Time   DIAGPAP  05/19/2021 1432    - Negative for intraepithelial lesion or malignancy (NILM)   ADEQPAP  05/19/2021 1432    Satisfactory for evaluation; transformation zone component PRESENT.   Gardasil: completed Sexually active: No Exercising: No Smoker: No PHQ-9: 3  GYN  HISTORY: PCOS HS 12/2020: Stage IV endometriosis (Ra-RSO and RSO and myomectomy with Dr. Pricilla Holm (Gyn Onc))  High risk of breast cancer (20%, s/p genetic counseling 2022, negative BRCA testing)  OB History  Gravida Para Term Preterm AB Living  0 0 0 0 0 0  SAB IAB Ectopic Multiple Live Births  0 0 0 0 0    Past Medical History:  Diagnosis Date   Anemia    Anxiety    Chest pain    Constipation    Cyst of right ovary 12/14/2020   Depression    Endometriosis of pelvis    Family history of breast cancer    Family history of colon cancer    Family history of ovarian cancer    Family history of prostate cancer    Hidradenitis suppurativa    PCOS (polycystic ovarian syndrome)    Prediabetes    Sleep apnea    Vitamin D deficiency     Past Surgical History:  Procedure Laterality Date   CERVICAL POLYPECTOMY     DILATATION & CURETTAGE/HYSTEROSCOPY WITH MYOSURE N/A 11/21/2016   Procedure: DILATATION & CURETTAGE/HYSTEROSCOPY WITH MYOSURE;  Surgeon: Maxie Better, MD;  Location: [facility];  Service: Gynecology;  [name];   ROBOTIC ASSISTED LAPAROSCOPIC OVARIAN CYSTECTOMY N/A 12/30/2020   Procedure: XI ROBOTIC ASSISTED LAPAROSCOPIC RIGHT SALPINGO-OOPHORECTOMY WITH PELVIC WASHINGS;  Surgeon: Carver Fila, MD;  [name];  Service: Gynecology;  [name];   WISDOM TOOTH EXTRACTION      Current Outpatient Medications on File Prior to Visit  Medication Sig Dispense Refill  cariprazine (VRAYLAR) 3 MG capsule Take 1 capsule (3 mg total) by mouth daily. 30 capsule 2   clindamycin (CLEOCIN T) 1 % external solution Apply 1 Application topically 1-2 times daily. 30 mL 5   doxycycline (VIBRA-TABS) 100 MG tablet Take 1 tablet (100 mg total) by mouth 2 (two) times daily with food for 14 days. 28 tablet 0   hydrOXYzine (VISTARIL) 25 MG capsule TAKE 1 CAPSULE BY MOUTH 2 TIMES DAILY AS NEEDED FOR ANXIETY. 180 capsule 1   modafinil (PROVIGIL) 200 MG tablet Take 1 tablet  (200 mg total) by mouth in the morning. 30 tablet 2   norethindrone (AYGESTIN) 5 MG tablet Take 2 tablets by mouth daily. Take three tabs daily     spironolactone (ALDACTONE) 100 MG tablet Take 1 tablet (100 mg total) by mouth at bedtime. 30 tablet 2   tretinoin (RETIN-A) 0.025 % cream Apply a pea sized amount to face topically every evening. 45 g 2   TRINTELLIX 20 MG TABS tablet Take 20 mg by mouth daily.     VITAMIN D PO Take by mouth.     vortioxetine HBr (TRINTELLIX) 20 MG TABS tablet Take 1 tablet (20 mg total) by mouth daily. (Patient not taking: Reported on 07/09/2023) 30 tablet 2   No current facility-administered medications on file prior to visit.    Social History   Socioeconomic History   Marital status: Single    Spouse name: Not on file   Number of children: 0   Years of education: Not on file   Highest education level: Bachelor's degree (e.g., BA, AB, BS)  Occupational History   Occupation: Engineer, site  Tobacco Use   Smoking status: Never   Smokeless tobacco: Never  Vaping Use   Vaping status: Never Used  Substance and Sexual Activity   Alcohol use: Yes    Comment: social   Drug use: Never   Sexual activity: Not Currently  Other Topics Concern   Not on file  Social History Narrative   Not on file   Social Drivers of Health   Financial Resource Strain: Low Risk  (11/02/2022)   Overall Financial Resource Strain (CARDIA)    Difficulty of Paying Living Expenses: Not hard at all  Food Insecurity: No Food Insecurity (11/02/2022)   Hunger Vital Sign    Worried About Running Out of Food in the Last Year: Never true    Ran Out of Food in the Last Year: Never true  Transportation Needs: No Transportation Needs (11/02/2022)   PRAPARE - Administrator, Civil Service (Medical): No    Lack of Transportation (Non-Medical): No  Physical Activity: Insufficiently Active (11/02/2022)   Exercise Vital Sign    Days of Exercise per Week: 1 day    Minutes of  Exercise per Session: 30 min  Stress: No Stress Concern Present (11/02/2022)   [employer] of Occupational Health - Occupational Stress Questionnaire    Feeling of Stress : Only a little  Social Connections: Moderately Isolated (11/02/2022)   Social Connection and Isolation Panel [NHANES]    Frequency of Communication with Friends and Family: More than three times a week    Frequency of Social Gatherings with Friends and Family: Twice a week    Attends Religious Services: More than 4 times per year    Active Member of [employer] or Organizations: No    Attends Engineer, structural: Not on file    Marital Status: Never married  Intimate Partner Violence:  Not on file    Family History  Problem Relation Age of Onset   Hypertension Mother    Depression Mother    Obesity Mother    Sleep apnea Father    Obesity Father    Colon cancer Maternal Uncle 20   Diabetes Paternal Aunt    Heart attack Maternal Grandmother    Prostate cancer Maternal Grandfather        dx 2s, metastatic   Stroke Paternal Grandfather    Breast cancer Half-Sister 43   Ovarian cancer Maternal Great-grandmother        dx ?, MGF's mother   Breast cancer Other        multiple of mother's paternal cousins   Ovarian cancer Other        multiple of mother's paternal cousins   Prostate cancer Other        multiple of mother's paternal cousins   Endometrial cancer Neg Hx    Pancreatic cancer Neg Hx     Allergies  Allergen Reactions   Doxycycline     Upset stomach   Sulfamethoxazole-Trimethoprim     upset stomach      PE Today's Vitals   07/09/23 1453  BP: 112/76  Pulse: 87  SpO2: 98%  Weight: 279 lb (126.6 kg)  Height: 5\' 6"  (1.676 m)   Body mass index is 45.03 kg/m.  Physical Exam Vitals reviewed. Exam conducted with a chaperone present.  Constitutional:      General: She is not in acute distress.    Appearance: Normal appearance.  HENT:     Head: Normocephalic and atraumatic.      Nose: Nose normal.  Eyes:     Extraocular Movements: Extraocular movements intact.     Conjunctiva/sclera: Conjunctivae normal.  Neck:     Thyroid: No thyroid mass, thyromegaly or thyroid tenderness.  Pulmonary:     Effort: Pulmonary effort is normal.  Chest:     Chest wall: No mass or tenderness.  Breasts:    Right: Normal. No swelling, mass, nipple discharge, skin change or tenderness.     Left: Normal. No swelling, mass, nipple discharge, skin change or tenderness.  Abdominal:     General: There is no distension.     Palpations: Abdomen is soft.     Tenderness: There is no abdominal tenderness.  Genitourinary:    General: Normal vulva.     Exam position: Lithotomy position.     Urethra: No prolapse.     Vagina: Normal. No vaginal discharge or bleeding.     Cervix: Normal. No lesion.     Uterus: Normal. Not enlarged and not tender.      Adnexa: Right adnexa normal and left adnexa normal.  Musculoskeletal:        General: Normal range of motion.     Cervical back: Normal range of motion.  Lymphadenopathy:     Upper Body:     Right upper body: No axillary adenopathy.     Left upper body: No axillary adenopathy.     Lower Body: No right inguinal adenopathy. No left inguinal adenopathy.  Skin:    General: Skin is warm and dry.     Findings: Rash (HS flare of left axilla and upper mons) present.  Neurological:     General: No focal deficit present.     Mental Status: She is alert.  Psychiatric:        Mood and Affect: Mood normal.        Behavior:  Behavior normal.       Assessment and Plan:        Well woman exam with routine gynecological exam Assessment & Plan: Cervical cancer screening performed according to ASCCP guidelines. Encouraged annual breast MR screening at age 32, MMG at age 80 Labs and immunizations with her primary Encouraged safe sexual practices as indicated Encouraged healthy lifestyle practices with diet and exercise For patients under 50yo, I  recommend 1000mg  calcium daily and 600IU of vitamin D daily.    Cervical cancer screening -     Cytology - PAP  PCOS (polycystic ovarian syndrome) Assessment & Plan: A1c 6.3 and lipids abnormal with PCP this year Normal blood pressure today Encouraged healthy diet and exercise. RD referral placed with patient agreement   Prediabetes -     Amb Referral to Nutrition and Diabetic Education  Class 3 severe obesity with serious comorbidity and body mass index (BMI) of 45.0 to 49.9 in adult, unspecified obesity type [facility]) Assessment & Plan: Encouraged healthy diet and exercise. RD referral placed with patient agreement   Orders: -     Amb Referral to Nutrition and Diabetic Education  At high risk for breast cancer Mass of upper outer quadrant of right breast Assessment & Plan: She has a 1/2 sister with breast cancer at 53.  High risk of breast cancer of 20.3%.  Negative genetic testing in 2022.  The Genetic counselor recommended mammograms at 31 and breast MRI's at 26. MRI results pending. Unable to palpate right right breast mass on exam today Clinical breast exam in 6 months, will determine need for right breast ultrasound based off of MRI results.  Endometriosis Assessment & Plan: Continue management with [facility] Next follow-up June 2025   Subserous leiomyoma of uterus Assessment & Plan: Continue management with [facility] Next follow-up June 2025  Anovulatory (dysfunctional uterine) bleeding Assessment & Plan: Continue management with [facility] Next follow-up June 2025  Hidradenitis On doxycycline for HS flare, managed by Derm. Also using Hibiclens and acne body wash   Rosalyn Gess, MD

## 2023-07-09 NOTE — Assessment & Plan Note (Signed)
Cervical cancer screening performed according to ASCCP guidelines. Encouraged annual breast MR screening at age 28, MMG at age 59 Labs and immunizations with her primary Encouraged safe sexual practices as indicated Encouraged healthy lifestyle practices with diet and exercise For patients under 50yo, I recommend 1000mg  calcium daily and 600IU of vitamin D daily.

## 2023-07-09 NOTE — Assessment & Plan Note (Signed)
Encouraged healthy diet and exercise. RD referral placed with patient agreement

## 2023-07-10 ENCOUNTER — Other Ambulatory Visit (HOSPITAL_COMMUNITY): Payer: Self-pay

## 2023-07-12 ENCOUNTER — Other Ambulatory Visit: Payer: Self-pay | Admitting: Obstetrics and Gynecology

## 2023-07-12 ENCOUNTER — Encounter: Payer: Self-pay | Admitting: Obstetrics and Gynecology

## 2023-07-12 DIAGNOSIS — N6310 Unspecified lump in the right breast, unspecified quadrant: Secondary | ICD-10-CM

## 2023-07-12 LAB — CYTOLOGY - PAP
Comment: NEGATIVE
Diagnosis: NEGATIVE
High risk HPV: NEGATIVE

## 2023-07-13 ENCOUNTER — Other Ambulatory Visit (HOSPITAL_COMMUNITY): Payer: Self-pay

## 2023-07-13 ENCOUNTER — Other Ambulatory Visit: Payer: Self-pay | Admitting: Obstetrics and Gynecology

## 2023-07-13 ENCOUNTER — Ambulatory Visit
Admission: RE | Admit: 2023-07-13 | Discharge: 2023-07-13 | Disposition: A | Discharge status: Home / Self Care | Payer: 59 | Source: Ambulatory Visit | Attending: Obstetrics and Gynecology | Admitting: Obstetrics and Gynecology

## 2023-07-13 ENCOUNTER — Ambulatory Visit
Admission: RE | Admit: 2023-07-13 | Discharge: 2023-07-13 | Disposition: A | Discharge status: Home / Self Care | Payer: 59 | Source: Ambulatory Visit | Attending: Obstetrics and Gynecology

## 2023-07-13 DIAGNOSIS — N6310 Unspecified lump in the right breast, unspecified quadrant: Secondary | ICD-10-CM

## 2023-07-13 DIAGNOSIS — N6311 Unspecified lump in the right breast, upper outer quadrant: Secondary | ICD-10-CM | POA: Diagnosis not present

## 2023-07-13 DIAGNOSIS — Z803 Family history of malignant neoplasm of breast: Secondary | ICD-10-CM | POA: Diagnosis not present

## 2023-07-13 DIAGNOSIS — N641 Fat necrosis of breast: Secondary | ICD-10-CM

## 2023-07-16 ENCOUNTER — Other Ambulatory Visit: Payer: 59

## 2023-07-17 ENCOUNTER — Encounter: Payer: Self-pay | Admitting: Obstetrics and Gynecology

## 2023-07-23 ENCOUNTER — Other Ambulatory Visit: Payer: Self-pay

## 2023-07-24 ENCOUNTER — Ambulatory Visit: Payer: 59 | Admitting: Psychology

## 2023-07-24 DIAGNOSIS — F331 Major depressive disorder, recurrent, moderate: Secondary | ICD-10-CM

## 2023-07-24 NOTE — Progress Notes (Signed)
 [facility] Counselor Initial Adult Exam  Name: Kendra Allen Date: 07/24/2023 MRN: [id number] DOB: [birth year] PCP: Garald Karlynn [name], MD  Time spent: 5:00pm-5:50pm   50 minutes  Guardian/Payee:  debara Six requested: No   Reason for Visit /Presenting Problem:  Pt present for face-to-face initial assessment update via video.  Pt consents to telehealth video session and is aware of limitations and benefits of virtual sessions.   Location of pt: home Location of therapist: home office.  Pt continues to need therapy to work on coping skills and to deal with symptoms of anxiety and depression.   Pt tends to overthink things.   She is working on trying to figure out her future career plans.   Acknowledged the progress pt has made this past year.  Pt is working a full time job and getting together with friends more.   This is a positive change from her former tendency to withdraw due to depression.   Reviewed pt's treatment plan for annual update.   Updated pt's treatment plan and IA.  Pt participated in setting treatment goals.   Plan to meet every two weeks.    Mental Status Exam: Appearance:   Casual     Behavior:  Appropriate  Motor:  Normal  Speech/Language:   Normal Rate  Affect:  Appropriate  Mood:  normal  Thought process:  normal  Thought content:    WNL  Sensory/Perceptual disturbances:    WNL  Orientation:  oriented to person, place, time/date, and situation  Attention:  Good  Concentration:  Good  Memory:  WNL  Fund of knowledge:   Good  Insight:    Good  Judgment:   Good  Impulse Control:  Good   Reported Symptoms:  stress, worrying.  Risk Assessment: Danger to Self:  No Self-injurious Behavior: No Danger to Others: No Duty to Warn:no Physical Aggression / Violence:No  Access to Firearms a concern: No  Gang Involvement:No  Patient / guardian was educated about steps to take if suicide or homicide risk level increases between visits:  no While future psychiatric events cannot be accurately predicted, the patient does not currently require acute inpatient psychiatric care and does not currently meet [facility]  involuntary commitment criteria.  Substance Abuse History: Current substance abuse: No     Past Psychiatric History:   Previous psychological history is significant for anxiety and depression Outpatient Providers:[facility] program History of Psych Hospitalization: No  Psychological Testing:  n/a    Abuse History:  Victim of: No.,  n/a    Report needed: No. Victim of Neglect:No. Perpetrator of  n/a   Witness / Exposure to Domestic Violence: No   Protective Services Involvement: No  Witness to [employer] Violence:  No   Family History:  Family History  Problem Relation Age of Onset   Hypertension Mother    Depression Mother    Obesity Mother    Sleep apnea Father    Obesity Father    Colon cancer Maternal Uncle 6   Diabetes Paternal Aunt    Heart attack Maternal Grandmother    Prostate cancer Maternal Grandfather        dx 31s, metastatic   Stroke Paternal Grandfather    Breast cancer Half-Sister 43   Ovarian cancer Maternal Great-grandmother        dx ?, MGF's mother   Breast cancer Other        multiple of mother's paternal cousins   Ovarian cancer Other  multiple of mother's paternal cousins   Prostate cancer Other        multiple of mother's paternal cousins   Endometrial cancer Neg Hx    Pancreatic cancer Neg Hx     Living situation: The patient lives with her parents. Pt has an older brother and sister who live on their own.   Family history of mental health issues: none known.  No family history of substance abuse.   Sexual Orientation: Straight  Relationship Status: single  Name of spouse / other:n/a If a parent, number of children / ages:n/a  Support Systems: friends parents  Financial Stress:  No   Income/Employment/Disability: Supported by [employer] and [employer]. Pt  works at [employer] as scientist, physiological.    Financial Planner: No   Educational History: Education: Risk Manager: Protestant  Any cultural differences that may affect / interfere with treatment:  not applicable   Recreation/Hobbies: Pt does not currently have any hobbies.    Stressors: Other: Pt states her main stressors are her health and being around people for a long period of time.     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  Pt states she has healthy relationships.  Barriers:  none noted.   Legal History: Pending legal issue / charges: The patient has no significant history of legal issues. History of legal issue / charges:  none  Medical History/Surgical History: reviewed Past Medical History:  Diagnosis Date   Anemia    Anxiety    Chest pain    Constipation    Cyst of right ovary 12/14/2020   Depression    Endometriosis of pelvis    Family history of breast cancer    Family history of colon cancer    Family history of ovarian cancer    Family history of prostate cancer    Hidradenitis suppurativa    PCOS (polycystic ovarian syndrome)    Prediabetes    Sleep apnea    Vitamin D  deficiency     Past Surgical History:  Procedure Laterality Date   CERVICAL POLYPECTOMY     DILATATION & CURETTAGE/HYSTEROSCOPY WITH MYOSURE N/A 11/21/2016   Procedure: DILATATION & CURETTAGE/HYSTEROSCOPY WITH MYOSURE;  Surgeon: Rutherford Gain, MD;  Location: [facility];  Service: Gynecology;  [name];   ROBOTIC ASSISTED LAPAROSCOPIC OVARIAN CYSTECTOMY N/A 12/30/2020   Procedure: XI ROBOTIC ASSISTED LAPAROSCOPIC RIGHT SALPINGO-OOPHORECTOMY WITH PELVIC WASHINGS;  Surgeon: Viktoria Comer SAUNDERS, MD;  [name];  Service: Gynecology;  [name];   WISDOM TOOTH EXTRACTION      Medications: Current Outpatient Medications  Medication Sig Dispense Refill   cariprazine  (VRAYLAR )  3 MG capsule Take 1 capsule (3 mg total) by mouth daily. 30 capsule 2   clindamycin  (CLEOCIN  T) 1 % external solution Apply 1 Application topically 1-2 times daily. 30 mL 5   hydrOXYzine  (VISTARIL ) 25 MG capsule TAKE 1 CAPSULE BY MOUTH 2 TIMES DAILY AS NEEDED FOR ANXIETY. 180 capsule 1   modafinil  (PROVIGIL ) 200 MG tablet Take 1 tablet (200 mg total) by mouth in the morning. 30 tablet 2   norethindrone  (AYGESTIN ) 5 MG tablet Take 2 tablets by mouth daily. Take three tabs daily     spironolactone  (ALDACTONE ) 100 MG tablet Take 1 tablet (100 mg total) by mouth at bedtime. 30 tablet 2   tretinoin  (RETIN-A ) 0.025 % cream Apply a pea sized amount to face topically every evening. 45 g 2   TRINTELLIX  20 MG  TABS tablet Take 20 mg by mouth daily.     VITAMIN D  PO Take by mouth.     vortioxetine  HBr (TRINTELLIX ) 20 MG TABS tablet Take 1 tablet (20 mg total) by mouth daily. (Patient not taking: Reported on 07/09/2023) 30 tablet 2   No current facility-administered medications for this visit.    Allergies  Allergen Reactions   Doxycycline      Upset stomach   Sulfamethoxazole-Trimethoprim     upset stomach    Diagnoses:  F33.1  Plan of Care: Recommend ongoing therapy.  Pt participated in setting treatment goals.   She wants to improve coping skills.  Plan to meet every two weeks.  Pt agrees with treatment plan.   Treatment Plan  (treatment plan target date: 07/23/2024). Client Abilities/Strengths  Pt is bright and motivated for therapy. Client Treatment Preferences  Individual therapy.  Client Statement of Needs  Improve coping skills.  Symptoms  Depressed or irritable mood.  Diminished interest in or enjoyment of activities.  Problems Addressed  Unipolar Depression Goals 1. Alleviate depressive symptoms and return to previous level of effective functioning. 2. Appropriately grieve the loss in order to normalize mood and to return to previously adaptive level of  functioning. Objective Learn and implement behavioral strategies to overcome depression. Target Date: 2024-07-23 Frequency: Biweekly  Progress: 50 Modality: individual  Related Interventions Engage the client in behavioral activation, increasing his/her activity level and contact with sources of reward, while identifying processes that inhibit activation.  Use behavioral techniques such as instruction, rehearsal, role-playing, role reversal, as needed, to facilitate activity in the client's daily life; reinforce success. Assist the client in developing skills that increase the likelihood of deriving pleasure from behavioral activation (e.g., assertiveness skills, developing an exercise plan, less internal/more external focus, increased social involvement); reinforce success. Objective Identify important people in life, past and present, and describe the quality, good and poor, of those relationships. Target Date: 2024-07-23 Frequency: Biweekly  Progress: 50 Modality: individual  Related Interventions Conduct Interpersonal Therapy beginning with the assessment of the client's interpersonal inventory of important past and present relationships; develop a case formulation linking depression to grief, interpersonal role disputes, role transitions, and/or interpersonal deficits). Objective Learn and implement problem-solving and decision-making skills. Target Date: 2024-07-23 Frequency: Biweekly  Progress: 50 Modality: individual  Related Interventions Conduct Problem-Solving Therapy using techniques such as psychoeducation, modeling, and role-playing to teach client problem-solving skills (i.e., defining a problem specifically, generating possible solutions, evaluating the pros and cons of each solution, selecting and implementing a plan of action, evaluating the efficacy of the plan, accepting or revising the plan); role-play application of the problem-solving skill to a real life  issue. Encourage in the client the development of a positive problem orientation in which problems and solving them are viewed as a natural part of life and not something to be feared, despaired, or avoided. 3. Develop healthy interpersonal relationships that lead to the alleviation and help prevent the relapse of depression. 4. Develop healthy thinking patterns and beliefs about self, others, and the world that lead to the alleviation and help prevent the relapse of depression. 5. Recognize, accept, and cope with feelings of depression. Diagnosis F33.1  Conditions For Discharge Achievement of treatment goals and objectives   Veva Alma, LCSW

## 2023-07-31 ENCOUNTER — Encounter: Payer: Self-pay | Admitting: Obstetrics and Gynecology

## 2023-08-01 ENCOUNTER — Other Ambulatory Visit (HOSPITAL_COMMUNITY): Payer: Self-pay

## 2023-08-01 ENCOUNTER — Encounter: Payer: Self-pay | Admitting: Obstetrics and Gynecology

## 2023-08-02 ENCOUNTER — Other Ambulatory Visit (HOSPITAL_COMMUNITY): Payer: Self-pay

## 2023-08-02 ENCOUNTER — Encounter (HOSPITAL_COMMUNITY): Payer: Self-pay

## 2023-08-02 ENCOUNTER — Other Ambulatory Visit: Payer: Self-pay

## 2023-08-03 ENCOUNTER — Other Ambulatory Visit (HOSPITAL_COMMUNITY): Payer: Self-pay

## 2023-08-03 MED ORDER — VRAYLAR 3 MG PO CAPS
3.0000 mg | ORAL_CAPSULE | Freq: Every day | ORAL | 1 refills | Status: DC
  Filled 2023-08-03: qty 30, 30d supply, fill #0
  Filled 2023-09-02: qty 30, 30d supply, fill #1

## 2023-08-13 ENCOUNTER — Ambulatory Visit (INDEPENDENT_AMBULATORY_CARE_PROVIDER_SITE_OTHER): Payer: 59 | Admitting: Psychology

## 2023-08-13 DIAGNOSIS — F331 Major depressive disorder, recurrent, moderate: Secondary | ICD-10-CM

## 2023-08-13 NOTE — Progress Notes (Signed)
 [facility] Counselor/Therapist Progress Note  Patient ID: Kendra Allen, MRN: [id number],    Date: 08/13/2023  Time Spent: 5:00pm - 5:45pm   45 minutes   Treatment Type: Individual Therapy  Reported Symptoms: stress  Mental Status Exam: Appearance:  Casual     Behavior: Appropriate  Motor: Normal  Speech/Language:  Normal Rate  Affect: Appropriate  Mood: normal  Thought process: normal  Thought content:   WNL  Sensory/Perceptual disturbances:   WNL  Orientation: oriented to person, place, time/date, and situation  Attention: Good  Concentration: Good  Memory: WNL  Fund of knowledge:  Good  Insight:   Good  Judgment:  Good  Impulse Control: Good   Risk Assessment: Danger to Self:  No Self-injurious Behavior: No Danger to Others: No Duty to Warn:no Physical Aggression / Violence:No  Access to Firearms a concern: No  Gang Involvement:No   Subjective: Pt present for face-to-face individual therapy via video.  Pt consents to telehealth video session and is aware of limitations and benefits of virtual sessions.  Location of pt: home Location of therapist: home office.   Pt talked about work.   She had a busy and stressful day.   Worked on [employer]. Pt is planning to apply for new jobs. Pt talked about her trip to [place]. She had a good time but the travel was stressful.  Pt has a lot of plans with friends.   Pt states she likes to be with friends and family but needs time to herself to recharge.  Her parents do not understand when pt needs time to herself.  Addressed how pt can communicate effectively with her parents about her needs.  Pt states that overall her mood has been stable.  Worked on self care strategies. Provided supportive therapy.  Interventions: Cognitive Behavioral Therapy and Insight-Oriented  Diagnosis: F33.1  Plan of Care: Recommend ongoing therapy.  Pt participated in setting treatment goals.   She wants to improve  coping skills.  Plan to meet every two weeks.  Pt agrees with treatment plan.   Treatment Plan  (treatment plan target date: 07/23/2024). Client Abilities/Strengths  Pt is bright and motivated for therapy. Client Treatment Preferences  Individual therapy.  Client Statement of Needs  Improve coping skills.  Symptoms  Depressed or irritable mood.  Diminished interest in or enjoyment of activities.  Problems Addressed  Unipolar Depression Goals 1. Alleviate depressive symptoms and return to previous level of effective functioning. 2. Appropriately grieve the loss in order to normalize mood and to return to previously adaptive level of functioning. Objective Learn and implement behavioral strategies to overcome depression. Target Date: 2024-07-23 Frequency: Biweekly  Progress: 50 Modality: individual  Related Interventions Engage the client in "behavioral activation," increasing his/her activity level and contact with sources of reward, while identifying processes that inhibit activation.  Use behavioral techniques such as instruction, rehearsal, role-playing, role reversal, as needed, to facilitate activity in the client's daily life; reinforce success. Assist the client in developing skills that increase the likelihood of deriving pleasure from behavioral activation (e.g., assertiveness skills, developing an exercise plan, less internal/more external focus, increased social involvement); reinforce success. Objective Identify important people in life, past and present, and describe the quality, good and poor, of those relationships. Target Date: 2024-07-23 Frequency: Biweekly  Progress: 50 Modality: individual  Related Interventions Conduct Interpersonal Therapy beginning with the assessment of the client's "interpersonal inventory" of important past and present relationships; develop a case formulation linking depression to grief, interpersonal  role disputes, role transitions, and/or  interpersonal deficits). Objective Learn and implement problem-solving and decision-making skills. Target Date: 2024-07-23 Frequency: Biweekly  Progress: 50 Modality: individual  Related Interventions Conduct Problem-Solving Therapy using techniques such as psychoeducation, modeling, and role-playing to teach client problem-solving skills (i.e., defining a problem specifically, generating possible solutions, evaluating the pros and cons of each solution, selecting and implementing a plan of action, evaluating the efficacy of the plan, accepting or revising the plan); role-play application of the problem-solving skill to a real life issue. Encourage in the client the development of a positive problem orientation in which problems and solving them are viewed as a natural part of life and not something to be feared, despaired, or avoided. 3. Develop healthy interpersonal relationships that lead to the alleviation and help prevent the relapse of depression. 4. Develop healthy thinking patterns and beliefs about self, others, and the world that lead to the alleviation and help prevent the relapse of depression. 5. Recognize, accept, and cope with feelings of depression. Diagnosis F33.1  Conditions For Discharge Achievement of treatment goals and objectives   Salomon Fick, LCSW

## 2023-08-14 ENCOUNTER — Encounter: Payer: 59 | Attending: Obstetrics and Gynecology | Admitting: Skilled Nursing Facility1

## 2023-08-14 ENCOUNTER — Encounter: Payer: Self-pay | Admitting: Skilled Nursing Facility1

## 2023-08-14 DIAGNOSIS — E631 Imbalance of constituents of food intake: Secondary | ICD-10-CM | POA: Insufficient documentation

## 2023-08-14 NOTE — Progress Notes (Signed)
 Medical Nutrition Therapy  Appointment Start time:  7:23  Appointment End time:  8:30  Primary concerns today: eating healthy overall   Referral diagnosis: [employer] Employee    NUTRITION ASSESSMENT   Clinical Medical Hx: depression, OSA, anemia, anxiety Medications: see list Labs: A1C 6.3, HDL 28.40, LDL cholesterol 123 Notable Signs/Symptoms: none reported  Lifestyle & Dietary Hx  Pt states she works with a therapist stating it is going well.  Pt states she spends the weekends hanging with her friends.   Estimated daily fluid intake:  oz Supplements: vitamin D Sleep: 8-9 hours wakes feeling rested sometimes  Stress / self-care: 5 on a scale of 1-10; pt states she uses sleep or eating to cope  Current average weekly physical activity: ADL's  24-Hr Dietary Recall: eating out on the weekends; snacking more on the weekends First Meal: skipped Snack:  Second Meal: drug rep meals Snack:  Third Meal: 2 beef tacos + lettuce + sour cream + cheese Snack: sweets  Beverages: water, sweet tea, sprite  NUTRITION DIAGNOSIS  NI-1.5 Excessive energy intake As related to Failure to adjust for lifestyle changes.  As evidenced by pt report of emotional eating and lack of social engagement    NUTRITION INTERVENTION  Nutrition education (E-1) on the following topics:  Creation of balanced and diverse meals to increase the intake of nutrient-rich foods that provide essential vitamins, minerals, fiber, and phytonutrients  Variety of Fruits and Vegetables:  Aim for a colorful array of fruits and vegetables to ensure a wide range of nutrients. Include a mix of leafy greens, berries, citrus fruits, cruciferous vegetables, and more. Whole Grains: Choose whole grains over refined grains. Examples include brown rice, quinoa, oats, whole wheat, and barley. Lean Proteins: Include lean sources of protein, such as poultry, fish, tofu, legumes, beans, lentils, and low-fat dairy products. Limit red and  processed meats. Healthy Fats: Incorporate sources of healthy fats, including avocados, nuts, seeds, and olive oil. Limit saturated and trans fats found in fried and processed foods. Dairy or Dairy Alternatives: Choose low-fat or fat-free dairy products, or plant-based alternatives like almond or soy milk. Portion Control: Be mindful of portion sizes to avoid overeating. Pay attention to hunger and satisfaction cues. Limit Added Sugars: Minimize the consumption of sugary beverages, snacks, and desserts. Check food labels for added sugars and opt for natural sources of sweetness such as whole fruits. Hydration: Drink plenty of water throughout the day. Limit sugary drinks and excessive caffeine intake. Moderate Sodium Intake: Reduce the consumption of high-sodium foods. Use herbs and spices for flavor instead of excessive salt. Meal Planning and Preparation: Plan and prepare meals ahead of time to make healthier choices more convenient. Include a mix of food groups in each meal. Limit Processed Foods: Minimize the intake of highly processed and packaged foods that are often high in added sugars, salt, and unhealthy fats. Regular Physical Activity: Combine a healthy diet with regular physical activity for overall well-being. Aim for at least 150 minutes of moderate-intensity aerobic exercise per week, along with strength training. Moderation and Balance: Enjoy treats and indulgent foods in moderation, emphasizing balance rather than strict restriction.  Handouts Provided Include  Detailed MyPlate  Learning Style & Readiness for Change Teaching method utilized: Visual & Auditory  Demonstrated degree of understanding via: Teach Back  Barriers to learning/adherence to lifestyle change: none identified   Goals Established by Pt I [facility] I will walk 2 days a  week for 30 minutes  I will drink more water I will eat breakfast: such as  yogurt or peanut butter toast    MONITORING & EVALUATION Dietary intake, weekly physical activity  Next Steps  Patient is to follow up in 6 weeks.

## 2023-08-21 ENCOUNTER — Other Ambulatory Visit: Payer: Self-pay | Admitting: Internal Medicine

## 2023-08-21 ENCOUNTER — Other Ambulatory Visit (HOSPITAL_COMMUNITY): Payer: Self-pay

## 2023-08-24 ENCOUNTER — Other Ambulatory Visit (HOSPITAL_COMMUNITY): Payer: Self-pay

## 2023-08-24 ENCOUNTER — Other Ambulatory Visit: Payer: Self-pay

## 2023-08-24 MED ORDER — HYDROXYZINE PAMOATE 25 MG PO CAPS
25.0000 mg | ORAL_CAPSULE | Freq: Two times a day (BID) | ORAL | 1 refills | Status: DC | PRN
  Filled 2023-08-24: qty 180, 90d supply, fill #0

## 2023-08-25 ENCOUNTER — Other Ambulatory Visit: Payer: Self-pay

## 2023-08-25 ENCOUNTER — Other Ambulatory Visit (HOSPITAL_COMMUNITY): Payer: Self-pay

## 2023-08-25 ENCOUNTER — Telehealth: Admitting: Physician Assistant

## 2023-08-25 DIAGNOSIS — J069 Acute upper respiratory infection, unspecified: Secondary | ICD-10-CM

## 2023-08-25 MED ORDER — FLUTICASONE PROPIONATE 50 MCG/ACT NA SUSP
2.0000 | Freq: Every day | NASAL | 6 refills | Status: DC
  Filled 2023-08-25: qty 16, 30d supply, fill #0

## 2023-08-25 MED ORDER — CETIRIZINE HCL 10 MG PO TABS
10.0000 mg | ORAL_TABLET | Freq: Every day | ORAL | 0 refills | Status: DC
  Filled 2023-08-25: qty 14, 14d supply, fill #0

## 2023-08-25 MED ORDER — BENZONATATE 100 MG PO CAPS
100.0000 mg | ORAL_CAPSULE | Freq: Two times a day (BID) | ORAL | 0 refills | Status: DC | PRN
  Filled 2023-08-25: qty 20, 10d supply, fill #0

## 2023-08-25 NOTE — Progress Notes (Signed)
 Virtual Visit Consent   Kendra Allen, you are scheduled for a virtual visit with a [facility] provider today. Just as with appointments in the office, your consent must be obtained to participate. Your consent will be active for this visit and any virtual visit you may have with one of our providers in the next 365 days. If you have a MyChart account, a copy of this consent can be sent to you electronically.  As this is a virtual visit, video technology does not allow for your provider to perform a traditional examination. This may limit your provider's ability to fully assess your condition. If your provider identifies any concerns that need to be evaluated in person or the need to arrange testing (such as labs, EKG, etc.), we will make arrangements to do so. Although advances in technology are sophisticated, we cannot ensure that it will always work on either your end or our end. If the connection with a video visit is poor, the visit may have to be switched to a telephone visit. With either a video or telephone visit, we are not always able to ensure that we have a secure connection.  By engaging in this virtual visit, you consent to the provision of healthcare and authorize for your insurance to be billed (if applicable) for the services provided during this visit. Depending on your insurance coverage, you may receive a charge related to this service.  I need to obtain your verbal consent now. Are you willing to proceed with your visit today? Kendra Allen has provided verbal consent on 08/25/2023 for a virtual visit (video or telephone). Laure Kidney, [place]  Date: 08/25/2023 11:37 AM   Virtual Visit via Video Note   I, Laure Kidney, connected with  Kendra Allen  ([id number], [birth year]) on 08/25/23 at 11:30 AM EST by a video-enabled telemedicine application and verified that I am speaking with the correct person using two identifiers.  Location: Patient: Virtual Visit Location Patient:  Home Provider: Virtual Visit Location Provider: Home Office   I discussed the limitations of evaluation and management by telemedicine and the availability of in person appointments. The patient expressed understanding and agreed to proceed.    History of Present Illness: Kendra Allen is a 28 y.o. who identifies as a female who was assigned female at birth, and is being seen today for sore throat.  HPI: Sore Throat  This is a new problem. The current episode started yesterday. The problem has been unchanged. There has been no fever. The pain is at a severity of 6/10. The pain is moderate. Associated symptoms include ear pain. The treatment provided mild relief.    Problems:  Patient Active Problem List   Diagnosis Date Noted   Well woman exam with routine gynecological exam 07/09/2023   At high risk for breast cancer 05/31/2023   Ingrown toenail 07/25/2021   Endometriosis    Subserous leiomyoma of uterus    Anovulatory (dysfunctional uterine) bleeding 12/14/2020   Cough 09/28/2020   Genetic testing 07/02/2020   Family history of breast cancer    Family history of colon cancer    Family history of prostate cancer    Family history of ovarian cancer    Prediabetes    Well adult exam 04/12/2020   Snorings 04/12/2020   PCOS (polycystic ovarian syndrome) 12/04/2019   Vitamin D deficiency 03/31/2019   Hyperglycemia 03/31/2019   Anemia 01/21/2018   Midsternal chest pain 11/26/2015   Pain in joint, ankle and  foot 03/30/2015   Apathy 03/26/2014   Headache 03/26/2014   PMS (premenstrual syndrome) 01/26/2014   Anxiety and depression 01/26/2014   Fever and chills 09/10/2013   Fatigue 09/10/2013   Anxiety attack 06/09/2013   Constipation 04/02/2013   Diarrhea 04/02/2013   Allergic rhinitis 02/01/2012   Otitis media 12/12/2011   Obesity 03/30/2010   Hidradenitis 12/02/2009    Allergies:  Allergies  Allergen Reactions   Doxycycline     Upset stomach    Sulfamethoxazole-Trimethoprim     upset stomach   Medications:  Current Outpatient Medications:    cariprazine (VRAYLAR) 3 MG capsule, Take 1 capsule (3 mg total) by mouth daily., Disp: 30 capsule, Rfl: 1   clindamycin (CLEOCIN T) 1 % external solution, Apply 1 Application topically 1-2 times daily., Disp: 30 mL, Rfl: 5   hydrOXYzine (VISTARIL) 25 MG capsule, Take 1 capsule (25 mg total) by mouth 2 (two) times daily as needed for anxiety., Disp: 180 capsule, Rfl: 1   modafinil (PROVIGIL) 200 MG tablet, Take 1 tablet (200 mg total) by mouth in the morning., Disp: 30 tablet, Rfl: 2   norethindrone (AYGESTIN) 5 MG tablet, Take 2 tablets by mouth daily. Take three tabs daily, Disp: , Rfl:    spironolactone (ALDACTONE) 100 MG tablet, Take 1 tablet (100 mg total) by mouth at bedtime., Disp: 30 tablet, Rfl: 2   tretinoin (RETIN-A) 0.025 % cream, Apply a pea sized amount to face topically every evening., Disp: 45 g, Rfl: 2   TRINTELLIX 20 MG TABS tablet, Take 20 mg by mouth daily., Disp: , Rfl:    VITAMIN D PO, Take by mouth., Disp: , Rfl:    vortioxetine HBr (TRINTELLIX) 20 MG TABS tablet, Take 1 tablet (20 mg total) by mouth daily. (Patient not taking: Reported on 07/09/2023), Disp: 30 tablet, Rfl: 2  Observations/Objective: Patient is well-developed, well-nourished in no acute distress.  Resting comfortably  at home.  Head is normocephalic, atraumatic.  No labored breathing.  Speech is clear and coherent with logical content.  Patient is alert and oriented at baseline.    Assessment and Plan: 1. Viral URI (Primary)   Patient presents with symptoms suspicious for likely viral uri . Differentials include bacterial pneumonia, sinusitis, allergic rhinitis. Do not suspect underlying cardiopulmonary process. I considered, but think unlikely, dangerous causes of this patient's symptoms to include ACS, CHF or COPD exacerbations, pneumonia, pneumothorax. Patient is nontoxic appearing and not in need of  emergent medical intervention.  Plan: reassurance, reassessment, over the counter medications, discharge with PCP follow-up  Follow Up Instructions: I discussed the assessment and treatment plan with the patient. The patient was provided an opportunity to ask questions and all were answered. The patient agreed with the plan and demonstrated an understanding of the instructions.  A copy of instructions were sent to the patient via MyChart unless otherwise noted below.     The patient was advised to call back or seek an in-person evaluation if the symptoms worsen or if the condition fails to improve as anticipated.    Laure Kidney, PA-C

## 2023-08-27 ENCOUNTER — Encounter (HOSPITAL_COMMUNITY): Payer: Self-pay

## 2023-08-27 ENCOUNTER — Other Ambulatory Visit (HOSPITAL_COMMUNITY): Payer: Self-pay

## 2023-08-27 MED ORDER — MODAFINIL 200 MG PO TABS
200.0000 mg | ORAL_TABLET | Freq: Every morning | ORAL | 1 refills | Status: DC
  Filled 2023-08-27: qty 30, 30d supply, fill #0
  Filled 2023-09-30: qty 30, 30d supply, fill #1

## 2023-08-28 ENCOUNTER — Other Ambulatory Visit: Payer: Self-pay

## 2023-09-03 ENCOUNTER — Ambulatory Visit (INDEPENDENT_AMBULATORY_CARE_PROVIDER_SITE_OTHER): Payer: 59 | Admitting: Psychology

## 2023-09-03 DIAGNOSIS — F331 Major depressive disorder, recurrent, moderate: Secondary | ICD-10-CM

## 2023-09-03 NOTE — Progress Notes (Signed)
 [facility] Counselor/Therapist Progress Note  Patient ID: Kendra Allen, MRN: [id number],    Date: 09/03/2023  Time Spent: 5:00pm - 5:45pm   45 minutes   Treatment Type: Individual Therapy  Reported Symptoms: stress  Mental Status Exam: Appearance:  Casual     Behavior: Appropriate  Motor: Normal  Speech/Language:  Normal Rate  Affect: Appropriate  Mood: normal  Thought process: normal  Thought content:   WNL  Sensory/Perceptual disturbances:   WNL  Orientation: oriented to person, place, time/date, and situation  Attention: Good  Concentration: Good  Memory: WNL  Fund of knowledge:  Good  Insight:   Good  Judgment:  Good  Impulse Control: Good   Risk Assessment: Danger to Self:  No Self-injurious Behavior: No Danger to Others: No Duty to Warn:no Physical Aggression / Violence:No  Access to Firearms a concern: No  Gang Involvement:No   Subjective: Pt present for face-to-face individual therapy via video.  Pt consents to telehealth video session and is aware of limitations and benefits of virtual sessions.  Location of pt: home Location of therapist: home office.   Pt talked about work.  Work has been busy but she is trying to have an attitude of gratitude for having a job.   It is not a good time in the economy to find a new job so pt is focusing on being ok where she is.   Pt talked about seeing a nutritionist to address her pre diabetes.   Pt is finding working with a nutritionist helpful and has been off sugar for 3 weeks.   Pt is also nudging herself to take walks even when she does not feel like it.  Affirmed pt for her efforts.   Pt is continuing to engage socially on the weekends and is trying to schedule things she can look forward to.   Pt states that overall her mood has been stable.  Worked on self care strategies. Provided supportive therapy.  Interventions: Cognitive Behavioral Therapy and Insight-Oriented  Diagnosis:  F33.1  Plan of Care: Recommend ongoing therapy.  Pt participated in setting treatment goals.   She wants to improve coping skills.  Plan to meet every two weeks.  Pt agrees with treatment plan.   Treatment Plan  (treatment plan target date: 07/23/2024). Client Abilities/Strengths  Pt is bright and motivated for therapy. Client Treatment Preferences  Individual therapy.  Client Statement of Needs  Improve coping skills.  Symptoms  Depressed or irritable mood.  Diminished interest in or enjoyment of activities.  Problems Addressed  Unipolar Depression Goals 1. Alleviate depressive symptoms and return to previous level of effective functioning. 2. Appropriately grieve the loss in order to normalize mood and to return to previously adaptive level of functioning. Objective Learn and implement behavioral strategies to overcome depression. Target Date: 2024-07-23 Frequency: Biweekly  Progress: 50 Modality: individual  Related Interventions Engage the client in "behavioral activation," increasing his/her activity level and contact with sources of reward, while identifying processes that inhibit activation.  Use behavioral techniques such as instruction, rehearsal, role-playing, role reversal, as needed, to facilitate activity in the client's daily life; reinforce success. Assist the client in developing skills that increase the likelihood of deriving pleasure from behavioral activation (e.g., assertiveness skills, developing an exercise plan, less internal/more external focus, increased social involvement); reinforce success. Objective Identify important people in life, past and present, and describe the quality, good and poor, of those relationships. Target Date: 2024-07-23 Frequency:  Biweekly  Progress: 50 Modality: individual  Related Interventions Conduct Interpersonal Therapy beginning with the assessment of the client's "interpersonal inventory" of important past and present  relationships; develop a case formulation linking depression to grief, interpersonal role disputes, role transitions, and/or interpersonal deficits). Objective Learn and implement problem-solving and decision-making skills. Target Date: 2024-07-23 Frequency: Biweekly  Progress: 50 Modality: individual  Related Interventions Conduct Problem-Solving Therapy using techniques such as psychoeducation, modeling, and role-playing to teach client problem-solving skills (i.e., defining a problem specifically, generating possible solutions, evaluating the pros and cons of each solution, selecting and implementing a plan of action, evaluating the efficacy of the plan, accepting or revising the plan); role-play application of the problem-solving skill to a real life issue. Encourage in the client the development of a positive problem orientation in which problems and solving them are viewed as a natural part of life and not something to be feared, despaired, or avoided. 3. Develop healthy interpersonal relationships that lead to the alleviation and help prevent the relapse of depression. 4. Develop healthy thinking patterns and beliefs about self, others, and the world that lead to the alleviation and help prevent the relapse of depression. 5. Recognize, accept, and cope with feelings of depression. Diagnosis F33.1  Conditions For Discharge Achievement of treatment goals and objectives   Salomon Fick, LCSW

## 2023-09-20 ENCOUNTER — Ambulatory Visit: Payer: 59 | Admitting: Psychology

## 2023-09-20 DIAGNOSIS — F331 Major depressive disorder, recurrent, moderate: Secondary | ICD-10-CM

## 2023-09-20 NOTE — Progress Notes (Signed)
 [facility] Counselor/Therapist Progress Note  Patient ID: Kendra Allen, MRN: [id number],    Date: 09/20/2023  Time Spent: 5:00pm - 5:45pm   45 minutes   Treatment Type: Individual Therapy  Reported Symptoms: stress  Mental Status Exam: Appearance:  Casual     Behavior: Appropriate  Motor: Normal  Speech/Language:  Normal Rate  Affect: Appropriate  Mood: normal  Thought process: normal  Thought content:   WNL  Sensory/Perceptual disturbances:   WNL  Orientation: oriented to person, place, time/date, and situation  Attention: Good  Concentration: Good  Memory: WNL  Fund of knowledge:  Good  Insight:   Good  Judgment:  Good  Impulse Control: Good   Risk Assessment: Danger to Self:  No Self-injurious Behavior: No Danger to Others: No Duty to Warn:no Physical Aggression / Violence:No  Access to Firearms a concern: No  Gang Involvement:No   Subjective: Pt present for face-to-face individual therapy via video.  Pt consents to telehealth video session and is aware of limitations and benefits of virtual sessions.  Location of pt: home Location of therapist: home office.   Pt talked about work.  Work has been busy and she has been given some extra responsibilities.  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  Addressed how pt can find out more about the profession.   Pt talked about doing well on her nutrition plan.  She has lost some weight.  She is doing a good job making healthy eating choices.   Pt is continuing to engage socially on the weekends and is trying to schedule things she can look forward to.   Addressed how pt can add exercise to her schedule.  Pt states that overall her mood has been stable.  Worked on self care strategies. Provided supportive therapy.  Interventions: Cognitive Behavioral Therapy and Insight-Oriented  Diagnosis: F33.1  Plan of Care: Recommend ongoing therapy.  Pt participated in setting  treatment goals.   She wants to improve coping skills.  Plan to meet every two weeks.  Pt agrees with treatment plan.   Treatment Plan  (treatment plan target date: 07/23/2024). Client Abilities/Strengths  Pt is bright and motivated for therapy. Client Treatment Preferences  Individual therapy.  Client Statement of Needs  Improve coping skills.  Symptoms  Depressed or irritable mood.  Diminished interest in or enjoyment of activities.  Problems Addressed  Unipolar Depression Goals 1. Alleviate depressive symptoms and return to previous level of effective functioning. 2. Appropriately grieve the loss in order to normalize mood and to return to previously adaptive level of functioning. Objective Learn and implement behavioral strategies to overcome depression. Target Date: 2024-07-23 Frequency: Biweekly  Progress: 50 Modality: individual  Related Interventions Engage the client in "behavioral activation," increasing his/her activity level and contact with sources of reward, while identifying processes that inhibit activation.  Use behavioral techniques such as instruction, rehearsal, role-playing, role reversal, as needed, to facilitate activity in the client's daily life; reinforce success. Assist the client in developing skills that increase the likelihood of deriving pleasure from behavioral activation (e.g., assertiveness skills, developing an exercise plan, less internal/more external focus, increased social involvement); reinforce success. Objective Identify important people in life, past and present, and describe the quality, good and poor, of those relationships. Target Date: 2024-07-23 Frequency: Biweekly  Progress: 50 Modality: individual  Related Interventions Conduct Interpersonal Therapy beginning with the assessment of the client's "interpersonal inventory" of important past and present  relationships; develop a case formulation linking depression to grief, interpersonal role  disputes, role transitions, and/or interpersonal deficits). Objective Learn and implement problem-solving and decision-making skills. Target Date: 2024-07-23 Frequency: Biweekly  Progress: 50 Modality: individual  Related Interventions Conduct Problem-Solving Therapy using techniques such as psychoeducation, modeling, and role-playing to teach client problem-solving skills (i.e., defining a problem specifically, generating possible solutions, evaluating the pros and cons of each solution, selecting and implementing a plan of action, evaluating the efficacy of the plan, accepting or revising the plan); role-play application of the problem-solving skill to a real life issue. Encourage in the client the development of a positive problem orientation in which problems and solving them are viewed as a natural part of life and not something to be feared, despaired, or avoided. 3. Develop healthy interpersonal relationships that lead to the alleviation and help prevent the relapse of depression. 4. Develop healthy thinking patterns and beliefs about self, others, and the world that lead to the alleviation and help prevent the relapse of depression. 5. Recognize, accept, and cope with feelings of depression. Diagnosis F33.1  Conditions For Discharge Achievement of treatment goals and objectives   Salomon Fick, LCSW

## 2023-09-30 ENCOUNTER — Other Ambulatory Visit (HOSPITAL_COMMUNITY): Payer: Self-pay

## 2023-10-01 ENCOUNTER — Other Ambulatory Visit (HOSPITAL_COMMUNITY): Payer: Self-pay

## 2023-10-01 ENCOUNTER — Other Ambulatory Visit: Payer: Self-pay

## 2023-10-01 MED ORDER — CARIPRAZINE HCL 3 MG PO CAPS
3.0000 mg | ORAL_CAPSULE | Freq: Every day | ORAL | 0 refills | Status: DC
  Filled 2023-10-01: qty 30, 30d supply, fill #0

## 2023-10-01 MED ORDER — SPIRONOLACTONE 100 MG PO TABS
100.0000 mg | ORAL_TABLET | Freq: Every day | ORAL | 0 refills | Status: DC
  Filled 2023-10-01: qty 30, 30d supply, fill #0

## 2023-10-04 ENCOUNTER — Other Ambulatory Visit (HOSPITAL_COMMUNITY): Payer: Self-pay

## 2023-10-04 DIAGNOSIS — L700 Acne vulgaris: Secondary | ICD-10-CM | POA: Diagnosis not present

## 2023-10-04 DIAGNOSIS — L810 Postinflammatory hyperpigmentation: Secondary | ICD-10-CM | POA: Diagnosis not present

## 2023-10-04 DIAGNOSIS — F419 Anxiety disorder, unspecified: Secondary | ICD-10-CM | POA: Diagnosis not present

## 2023-10-04 DIAGNOSIS — F331 Major depressive disorder, recurrent, moderate: Secondary | ICD-10-CM | POA: Diagnosis not present

## 2023-10-04 DIAGNOSIS — L732 Hidradenitis suppurativa: Secondary | ICD-10-CM | POA: Diagnosis not present

## 2023-10-04 MED ORDER — TRINTELLIX 20 MG PO TABS
20.0000 mg | ORAL_TABLET | Freq: Every day | ORAL | 2 refills | Status: DC
  Filled 2023-10-04 – 2023-11-27 (×2): qty 30, 30d supply, fill #0

## 2023-10-04 MED ORDER — SPIRONOLACTONE 100 MG PO TABS
100.0000 mg | ORAL_TABLET | Freq: Every day | ORAL | 1 refills | Status: DC
Start: 2023-10-04 — End: 2024-01-07
  Filled 2023-10-04: qty 30, 30d supply, fill #0

## 2023-10-04 MED ORDER — HYDROXYZINE PAMOATE 25 MG PO CAPS
25.0000 mg | ORAL_CAPSULE | Freq: Every evening | ORAL | 2 refills | Status: DC
  Filled 2023-10-04 – 2024-03-11 (×2): qty 30, 30d supply, fill #0
  Filled 2024-04-08: qty 30, 30d supply, fill #1

## 2023-10-04 MED ORDER — MODAFINIL 200 MG PO TABS
200.0000 mg | ORAL_TABLET | Freq: Every morning | ORAL | 1 refills | Status: DC
  Filled 2023-10-04 – 2023-11-10 (×2): qty 30, 30d supply, fill #0
  Filled 2023-12-18: qty 30, 30d supply, fill #1

## 2023-10-04 MED ORDER — CLINDAMYCIN PHOSPHATE 1 % EX SOLN
1.0000 | Freq: Two times a day (BID) | CUTANEOUS | 5 refills | Status: AC
  Filled 2023-10-04 – 2023-12-18 (×2): qty 30, 15d supply, fill #0

## 2023-10-04 MED ORDER — VRAYLAR 3 MG PO CAPS
3.0000 mg | ORAL_CAPSULE | Freq: Every day | ORAL | 2 refills | Status: DC
Start: 2023-10-04 — End: 2024-01-07
  Filled 2023-11-02: qty 30, 30d supply, fill #0
  Filled 2023-11-27: qty 30, 30d supply, fill #1
  Filled 2024-01-02: qty 30, 30d supply, fill #2

## 2023-10-11 ENCOUNTER — Ambulatory Visit (INDEPENDENT_AMBULATORY_CARE_PROVIDER_SITE_OTHER): Payer: 59 | Admitting: Psychology

## 2023-10-11 ENCOUNTER — Encounter: Payer: Self-pay | Admitting: Internal Medicine

## 2023-10-11 ENCOUNTER — Ambulatory Visit: Payer: 59 | Admitting: Internal Medicine

## 2023-10-11 VITALS — BP 114/78 | HR 77 | Temp 98.0°F | Ht 66.0 in | Wt 270.6 lb

## 2023-10-11 DIAGNOSIS — F331 Major depressive disorder, recurrent, moderate: Secondary | ICD-10-CM | POA: Diagnosis not present

## 2023-10-11 DIAGNOSIS — Z0000 Encounter for general adult medical examination without abnormal findings: Secondary | ICD-10-CM

## 2023-10-11 DIAGNOSIS — F410 Panic disorder [episodic paroxysmal anxiety] without agoraphobia: Secondary | ICD-10-CM | POA: Diagnosis not present

## 2023-10-11 DIAGNOSIS — Z13220 Encounter for screening for lipoid disorders: Secondary | ICD-10-CM | POA: Diagnosis not present

## 2023-10-11 DIAGNOSIS — E538 Deficiency of other specified B group vitamins: Secondary | ICD-10-CM

## 2023-10-11 DIAGNOSIS — R5383 Other fatigue: Secondary | ICD-10-CM | POA: Diagnosis not present

## 2023-10-11 NOTE — Assessment & Plan Note (Signed)

## 2023-10-11 NOTE — Progress Notes (Signed)
 Subjective:  Patient ID: Kendra Allen, female    DOB: [birth year]  Age: 28 y.o. MRN: [id number]  CC: Medical Management of Chronic Issues (Requesting labs, A1C. Well adult exam if possible)   HPI [name] presents for a well exam  Outpatient Medications Prior to Visit  Medication Sig Dispense Refill  . cariprazine  (VRAYLAR ) 3 MG capsule Take 1 capsule (3 mg total) by mouth daily. 30 capsule 2  . clindamycin  (CLEOCIN  T) 1 % external solution 1 application Externally 1-2 times a day 30 days 30 mL 5  . fluticasone  (FLONASE ) 50 MCG/ACT nasal spray Place 2 sprays into both nostrils daily. 16 g 6  . hydrOXYzine  (VISTARIL ) 25 MG capsule Take 1 capsule (25 mg total) by mouth 2 (two) times daily as needed for anxiety. 180 capsule 1  . hydrOXYzine  (VISTARIL ) 25 MG capsule Take 1 capsule (25 mg total) by mouth every evening. 30 capsule 2  . modafinil  (PROVIGIL ) 200 MG tablet Take 1 tablet (200 mg total) by mouth in the morning. 30 tablet 1  . norethindrone  (AYGESTIN ) 5 MG tablet Take 2 tablets by mouth daily. Take three tabs daily    . tretinoin  (RETIN-A ) 0.025 % cream Apply a pea sized amount to face topically every evening. 45 g 2  . VITAMIN D  PO Take by mouth.    . vortioxetine  HBr (TRINTELLIX ) 20 MG TABS tablet Take 1 tablet (20 mg total) by mouth daily. 30 tablet 2  . benzonatate  (TESSALON ) 100 MG capsule Take 1 capsule (100 mg total) by mouth 2 (two) times daily as needed for cough. 20 capsule 0  . clindamycin  (CLEOCIN  T) 1 % external solution Apply 1 Application topically 1-2 times daily. 30 mL 5  . TRINTELLIX  20 MG TABS tablet Take 20 mg by mouth daily.    . cetirizine  (ZYRTEC  ALLERGY) 10 MG tablet Take 1 tablet (10 mg total) by mouth daily for 14 days. 14 tablet 0  . spironolactone  (ALDACTONE ) 100 MG tablet Take 1 tablet (100 mg total) by mouth at bedtime. (Patient not taking: Reported on 10/11/2023) 30 tablet 1  . vortioxetine  HBr (TRINTELLIX ) 20 MG TABS tablet Take 1 tablet (20  mg total) by mouth daily. (Patient not taking: Reported on 10/11/2023) 30 tablet 2   No facility-administered medications prior to visit.    ROS: Review of Systems  Constitutional:  Negative for activity change, appetite change, chills, fatigue and unexpected weight change.  HENT:  Negative for congestion, mouth sores and sinus pressure.   Eyes:  Negative for visual disturbance.  Respiratory:  Negative for cough and chest tightness.   Gastrointestinal:  Negative for abdominal pain and nausea.  Genitourinary:  Negative for difficulty urinating, frequency and vaginal pain.  Musculoskeletal:  Negative for back pain and gait problem.  Skin:  Negative for pallor and rash.  Neurological:  Negative for dizziness, tremors, weakness, numbness and headaches.  Psychiatric/Behavioral:  Negative for confusion and sleep disturbance.     Objective:  BP 114/78   Pulse 77   Temp 98 F (36.7 C)   Ht 5\' 6"  (1.676 m)   Wt 270 lb 9.6 oz (122.7 kg)   SpO2 98%   BMI 43.68 kg/m   BP Readings from Last 3 Encounters:  10/11/23 114/78  07/09/23 112/76  05/31/23 108/78    Wt Readings from Last 3 Encounters:  10/11/23 270 lb 9.6 oz (122.7 kg)  07/09/23 279 lb (126.6 kg)  05/31/23 279 lb (126.6 kg)    Physical  Exam Constitutional:      General: She is not in acute distress.    Appearance: Normal appearance. She is well-developed.  HENT:     Head: Normocephalic.     Right Ear: External ear normal.     Left Ear: External ear normal.     Nose: Nose normal.  Eyes:     General:        Right eye: No discharge.        Left eye: No discharge.     Conjunctiva/sclera: Conjunctivae normal.     Pupils: Pupils are equal, round, and reactive to light.  Neck:     Thyroid : No thyromegaly.     Vascular: No JVD.     Trachea: No tracheal deviation.  Cardiovascular:     Rate and Rhythm: Normal rate and regular rhythm.     Heart sounds: Normal heart sounds.  Pulmonary:     Effort: No respiratory  distress.     Breath sounds: No stridor. No wheezing.  Abdominal:     General: Bowel sounds are normal. There is no distension.     Palpations: Abdomen is soft. There is no mass.     Tenderness: There is no abdominal tenderness. There is no guarding or rebound.  Musculoskeletal:        General: No tenderness.     Cervical back: Normal range of motion and neck supple. No rigidity.  Lymphadenopathy:     Cervical: No cervical adenopathy.  Skin:    Findings: No erythema or rash.  Neurological:     Mental Status: She is oriented to person, place, and time.     Cranial Nerves: No cranial nerve deficit.     Motor: No abnormal muscle tone.     Coordination: Coordination normal.     Deep Tendon Reflexes: Reflexes normal.  Psychiatric:        Behavior: Behavior normal.        Thought Content: Thought content normal.        Judgment: Judgment normal.    Lab Results  Component Value Date   WBC 6.7 11/06/2022   HGB 14.7 11/06/2022   HCT 44.1 11/06/2022   PLT 375.0 11/06/2022   GLUCOSE 89 11/06/2022   CHOL 161 11/06/2022   TRIG 47.0 11/06/2022   HDL 28.40 (L) 11/06/2022   LDLCALC 123 (H) 11/06/2022   ALT 29 11/06/2022   AST 20 11/06/2022   NA 138 11/06/2022   K 3.8 11/06/2022   CL 103 11/06/2022   CREATININE 0.61 11/06/2022   BUN 5 (L) 11/06/2022   CO2 25 11/06/2022   TSH 2.30 11/06/2022   HGBA1C 6.3 11/06/2022    MM 3D DIAGNOSTIC MAMMOGRAM UNILATERAL RIGHT BREAST Result Date: 07/13/2023 CLINICAL DATA:  Patient reports a small palpable lump the right breast laterally near 10 o'clock. Family history of breast carcinoma. EXAM: DIGITAL DIAGNOSTIC UNILATERAL RIGHT MAMMOGRAM WITH TOMOSYNTHESIS AND CAD; ULTRASOUND RIGHT BREAST LIMITED TECHNIQUE: Right digital diagnostic mammography and breast tomosynthesis was performed. The images were evaluated with computer-aided detection. ; Targeted ultrasound examination of the right breast was performed COMPARISON:  Previous exam(s). ACR Breast  Density Category b: There are scattered areas of fibroglandular density. FINDINGS: There are no masses, areas of architectural distortion areas of significant asymmetry or suspicious calcifications. No mammographic change. On physical exam, subtle firm superficial nodule palpated right breast just peripheral to the areola margin. Targeted ultrasound is performed, showing a small mixed echogenicity lesion just below the skin right breast at 10  o'clock, 6 cm the nipple, ill-defined margins, measuring 1.0 x 0.6 x 0.8 cm. Appearance is consistent with an area of fat necrosis. IMPRESSION: 1. Probably benign small focal lesion, consistent with fat necrosis, in the right breast at 10 o'clock, 6 cm the nipple. Short-term follow-up recommend RECOMMENDATION: Right breast ultrasound in 3 months. I have discussed the findings and recommendations with the patient. If applicable, a reminder letter will be sent to the patient regarding the next appointment. BI-RADS CATEGORY  3: Probably benign. Electronically Signed   By: Amanda Jungling M.D.   On: 07/13/2023 13:49   US  LIMITED ULTRASOUND INCLUDING AXILLA RIGHT BREAST Result Date: 07/13/2023 CLINICAL DATA:  Patient reports a small palpable lump the right breast laterally near 10 o'clock. Family history of breast carcinoma. EXAM: DIGITAL DIAGNOSTIC UNILATERAL RIGHT MAMMOGRAM WITH TOMOSYNTHESIS AND CAD; ULTRASOUND RIGHT BREAST LIMITED TECHNIQUE: Right digital diagnostic mammography and breast tomosynthesis was performed. The images were evaluated with computer-aided detection. ; Targeted ultrasound examination of the right breast was performed COMPARISON:  Previous exam(s). ACR Breast Density Category b: There are scattered areas of fibroglandular density. FINDINGS: There are no masses, areas of architectural distortion areas of significant asymmetry or suspicious calcifications. No mammographic change. On physical exam, subtle firm superficial nodule palpated right breast just  peripheral to the areola margin. Targeted ultrasound is performed, showing a small mixed echogenicity lesion just below the skin right breast at 10 o'clock, 6 cm the nipple, ill-defined margins, measuring 1.0 x 0.6 x 0.8 cm. Appearance is consistent with an area of fat necrosis. IMPRESSION: 1. Probably benign small focal lesion, consistent with fat necrosis, in the right breast at 10 o'clock, 6 cm the nipple. Short-term follow-up recommend RECOMMENDATION: Right breast ultrasound in 3 months. I have discussed the findings and recommendations with the patient. If applicable, a reminder letter will be sent to the patient regarding the next appointment. BI-RADS CATEGORY  3: Probably benign. Electronically Signed   By: Amanda Jungling M.D.   On: 07/13/2023 13:49    Assessment & Plan:   Problem List Items Addressed This Visit     Well adult exam - Primary (Chronic)    We discussed age appropriate health related issues, including available/recomended screening tests and vaccinations. Labs were ordered to be later reviewed . All questions were answered. We discussed one or more of the following - seat belt use, use of sunscreen/sun exposure exercise, fall risk reduction, second hand smoke exposure, firearm use and storage, seat belt use, a need for adhering to healthy diet and exercise. Labs were ordered.  All questions were answered.       Relevant Orders   TSH   Urinalysis   CBC with Differential/Platelet   Lipid panel   Comprehensive metabolic panel with GFR   Vitamin B12   VITAMIN D  25 Hydroxy (Vit-D Deficiency, Fractures)   Anxiety attack   Relevant Orders   Vitamin B12   VITAMIN D  25 Hydroxy (Vit-D Deficiency, Fractures)   Fatigue   Relevant Orders   Vitamin B12   VITAMIN D  25 Hydroxy (Vit-D Deficiency, Fractures)      No orders of the defined types were placed in this encounter.     Follow-up: Return in about 6 months (around 04/11/2024) for a follow-up visit.  Anitra Barn,  MD

## 2023-10-11 NOTE — Progress Notes (Signed)
 [facility] Counselor/Therapist Progress Note  Patient ID: Kendra Allen, MRN: [id number],    Date: 10/11/2023  Time Spent: 5:00pm - 5:45pm   45 minutes   Treatment Type: Individual Therapy  Reported Symptoms: stress  Mental Status Exam: Appearance:  Casual     Behavior: Appropriate  Motor: Normal  Speech/Language:  Normal Rate  Affect: Appropriate  Mood: normal  Thought process: normal  Thought content:   WNL  Sensory/Perceptual disturbances:   WNL  Orientation: oriented to person, place, time/date, and situation  Attention: Good  Concentration: Good  Memory: WNL  Fund of knowledge:  Good  Insight:   Good  Judgment:  Good  Impulse Control: Good   Risk Assessment: Danger to Self:  No Self-injurious Behavior: No Danger to Others: No Duty to Warn:no Physical Aggression / Violence:No  Access to Firearms a concern: No  Gang Involvement:No   Subjective: Pt present for face-to-face individual therapy via video.  Pt consents to telehealth video session and is aware of limitations and benefits of virtual sessions.  Location of pt: home Location of therapist: home office.   Pt talked about work.  Work has been busy and she has been given some extra responsibilities.  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  She has to take a test to qualify for the program.   Pt is studying and preparing for the exam.  She is worried about the science portion of the test.   Pt talked about doing well on her nutrition plan.  She has lost some weight.  She is doing a good job making healthy eating choices. There have been some times when it has been challenging to resist sweets but she has stayed the course.   Pt is motivated by not wanting to have diabetes and she wants to get her A1C down.   Pt is continuing to engage socially on the weekends and is trying to schedule things she can look forward to.   Pt has added exercise to her schedule by taking  walks on Fridays and Saturdays.   Pt states that overall her mood has been stable.  Pt feels like she is in a good place compared to how she felt a year ago.  At times she gets irritable when tired but she using coping strategies to redirect her thoughts.   Worked on self care strategies. Provided supportive therapy.  Interventions: Cognitive Behavioral Therapy and Insight-Oriented  Diagnosis: F33.1  Plan of Care: Recommend ongoing therapy.  Pt participated in setting treatment goals.   She wants to improve coping skills.  Plan to meet every two weeks.  Pt agrees with treatment plan.   Treatment Plan  (treatment plan target date: 07/23/2024). Client Abilities/Strengths  Pt is bright and motivated for therapy. Client Treatment Preferences  Individual therapy.  Client Statement of Needs  Improve coping skills.  Symptoms  Depressed or irritable mood.  Diminished interest in or enjoyment of activities.  Problems Addressed  Unipolar Depression Goals 1. Alleviate depressive symptoms and return to previous level of effective functioning. 2. Appropriately grieve the loss in order to normalize mood and to return to previously adaptive level of functioning. Objective Learn and implement behavioral strategies to overcome depression. Target Date: 2024-07-23 Frequency: Biweekly  Progress: 50 Modality: individual  Related Interventions Engage the client in "behavioral activation," increasing his/her activity level and contact with sources of reward, while identifying processes that inhibit activation.  Use  behavioral techniques such as instruction, rehearsal, role-playing, role reversal, as needed, to facilitate activity in the client's daily life; reinforce success. Assist the client in developing skills that increase the likelihood of deriving pleasure from behavioral activation (e.g., assertiveness skills, developing an exercise plan, less internal/more external focus, increased social  involvement); reinforce success. Objective Identify important people in life, past and present, and describe the quality, good and poor, of those relationships. Target Date: 2024-07-23 Frequency: Biweekly  Progress: 50 Modality: individual  Related Interventions Conduct Interpersonal Therapy beginning with the assessment of the client's "interpersonal inventory" of important past and present relationships; develop a case formulation linking depression to grief, interpersonal role disputes, role transitions, and/or interpersonal deficits). Objective Learn and implement problem-solving and decision-making skills. Target Date: 2024-07-23 Frequency: Biweekly  Progress: 50 Modality: individual  Related Interventions Conduct Problem-Solving Therapy using techniques such as psychoeducation, modeling, and role-playing to teach client problem-solving skills (i.e., defining a problem specifically, generating possible solutions, evaluating the pros and cons of each solution, selecting and implementing a plan of action, evaluating the efficacy of the plan, accepting or revising the plan); role-play application of the problem-solving skill to a real life issue. Encourage in the client the development of a positive problem orientation in which problems and solving them are viewed as a natural part of life and not something to be feared, despaired, or avoided. 3. Develop healthy interpersonal relationships that lead to the alleviation and help prevent the relapse of depression. 4. Develop healthy thinking patterns and beliefs about self, others, and the world that lead to the alleviation and help prevent the relapse of depression. 5. Recognize, accept, and cope with feelings of depression. Diagnosis F33.1  Conditions For Discharge Achievement of treatment goals and objectives   Willey Harrier, LCSW

## 2023-10-12 LAB — URINALYSIS
Bilirubin Urine: NEGATIVE
Hgb urine dipstick: NEGATIVE
Ketones, ur: NEGATIVE
Leukocytes,Ua: NEGATIVE
Nitrite: NEGATIVE
Specific Gravity, Urine: 1.01 (ref 1.000–1.030)
Total Protein, Urine: NEGATIVE
Urine Glucose: NEGATIVE
Urobilinogen, UA: 0.2 (ref 0.0–1.0)
pH: 7.5 (ref 5.0–8.0)

## 2023-10-12 LAB — COMPREHENSIVE METABOLIC PANEL WITH GFR
ALT: 24 U/L (ref 0–35)
AST: 16 U/L (ref 0–37)
Albumin: 4.1 g/dL (ref 3.5–5.2)
Alkaline Phosphatase: 59 U/L (ref 39–117)
BUN: 8 mg/dL (ref 6–23)
CO2: 26 meq/L (ref 19–32)
Calcium: 9.2 mg/dL (ref 8.4–10.5)
Chloride: 106 meq/L (ref 96–112)
Creatinine, Ser: 0.59 mg/dL (ref 0.40–1.20)
GFR: 123.7 mL/min (ref >60.00)
Glucose, Bld: 97 mg/dL (ref 70–99)
Potassium: 3.7 meq/L (ref 3.5–5.1)
Sodium: 139 meq/L (ref 135–145)
Total Bilirubin: 0.4 mg/dL (ref 0.2–1.2)
Total Protein: 7.4 g/dL (ref 6.0–8.3)

## 2023-10-12 LAB — CBC WITH DIFFERENTIAL/PLATELET
Basophils Absolute: 0.1 10*3/uL (ref 0.0–0.1)
Basophils Relative: 1.2 % (ref 0.0–3.0)
Eosinophils Absolute: 0.3 10*3/uL (ref 0.0–0.7)
Eosinophils Relative: 3 % (ref 0.0–5.0)
HCT: 38.7 % (ref 36.0–46.0)
Hemoglobin: 13.1 g/dL (ref 12.0–15.0)
Lymphocytes Relative: 26.4 % (ref 12.0–46.0)
Lymphs Abs: 2.5 10*3/uL (ref 0.7–4.0)
MCHC: 33.8 g/dL (ref 30.0–36.0)
MCV: 87.9 fl (ref 78.0–100.0)
Monocytes Absolute: 0.7 10*3/uL (ref 0.1–1.0)
Monocytes Relative: 8 % (ref 3.0–12.0)
Neutro Abs: 5.7 10*3/uL (ref 1.4–7.7)
Neutrophils Relative %: 61.4 % (ref 43.0–77.0)
Platelets: 363 10*3/uL (ref 150.0–400.0)
RBC: 4.41 Mil/uL (ref 3.87–5.11)
RDW: 13.3 % (ref 11.5–15.5)
WBC: 9.3 10*3/uL (ref 4.0–10.5)

## 2023-10-12 LAB — LIPID PANEL
Cholesterol: 124 mg/dL (ref 0–200)
HDL: 30.2 mg/dL — ABNORMAL LOW (ref >39.00)
LDL Cholesterol: 80 mg/dL (ref 0–99)
NonHDL: 94.21
Total CHOL/HDL Ratio: 4
Triglycerides: 69 mg/dL (ref 0.0–149.0)
VLDL: 13.8 mg/dL (ref 0.0–40.0)

## 2023-10-12 LAB — VITAMIN D 25 HYDROXY (VIT D DEFICIENCY, FRACTURES): VITD: 36.73 ng/mL (ref 30.00–100.00)

## 2023-10-12 LAB — TSH: TSH: 2.34 u[IU]/mL (ref 0.35–5.50)

## 2023-10-12 LAB — VITAMIN B12: Vitamin B-12: 185 pg/mL — ABNORMAL LOW (ref 211–911)

## 2023-10-13 ENCOUNTER — Encounter: Payer: Self-pay | Admitting: Internal Medicine

## 2023-10-14 ENCOUNTER — Encounter: Payer: Self-pay | Admitting: Internal Medicine

## 2023-10-14 ENCOUNTER — Other Ambulatory Visit: Payer: Self-pay | Admitting: Internal Medicine

## 2023-10-14 DIAGNOSIS — E538 Deficiency of other specified B group vitamins: Secondary | ICD-10-CM

## 2023-10-14 MED ORDER — CYANOCOBALAMIN 1000 MCG/ML IJ SOLN: INTRAMUSCULAR | 6 refills | Status: DC

## 2023-10-16 ENCOUNTER — Other Ambulatory Visit (HOSPITAL_COMMUNITY): Payer: Self-pay

## 2023-10-18 ENCOUNTER — Telehealth: Payer: Self-pay

## 2023-10-18 DIAGNOSIS — E66812 Obesity, class 2: Secondary | ICD-10-CM

## 2023-10-18 DIAGNOSIS — R739 Hyperglycemia, unspecified: Secondary | ICD-10-CM

## 2023-10-18 NOTE — Telephone Encounter (Signed)
 Patient called to adjust B12 injection appointment for tomorrow. During call she mentioned needing A1C lab that was not ordered during last visit. Orders placed to get during nurse visit tomorrow and patient will stop by lab on way out. Aaron Aas

## 2023-10-19 ENCOUNTER — Telehealth: Payer: Self-pay

## 2023-10-19 ENCOUNTER — Other Ambulatory Visit (INDEPENDENT_AMBULATORY_CARE_PROVIDER_SITE_OTHER)

## 2023-10-19 ENCOUNTER — Other Ambulatory Visit

## 2023-10-19 ENCOUNTER — Ambulatory Visit

## 2023-10-19 DIAGNOSIS — R739 Hyperglycemia, unspecified: Secondary | ICD-10-CM

## 2023-10-19 DIAGNOSIS — E538 Deficiency of other specified B group vitamins: Secondary | ICD-10-CM

## 2023-10-19 DIAGNOSIS — E66812 Obesity, class 2: Secondary | ICD-10-CM

## 2023-10-19 LAB — HEMOGLOBIN A1C: Hgb A1c MFr Bld: 5.9 % (ref 4.6–6.5)

## 2023-10-19 LAB — VITAMIN B12: Vitamin B-12: >1537 pg/mL — ABNORMAL HIGH (ref 211–911)

## 2023-10-19 MED ORDER — CYANOCOBALAMIN 1000 MCG/ML IJ SOLN
1000.0000 ug | Freq: Once | INTRAMUSCULAR | Status: AC
Start: 2023-10-19 — End: 2023-10-19
  Administered 2023-10-19: 1000 ug via INTRAMUSCULAR

## 2023-10-19 NOTE — Telephone Encounter (Signed)
 Please see instructions in the vitamin B12 prescription.  Thanks

## 2023-10-19 NOTE — Progress Notes (Signed)
 Patient visits today for their first b-12 injection. Patient informed of what they had received and tolerated injection well. Patient notified to reach out to office if needed.

## 2023-10-19 NOTE — Telephone Encounter (Signed)
 When patient visited today for their b-12 injection they were inquiring on how often they should be receiving this injection. Please advise

## 2023-10-23 ENCOUNTER — Encounter: Payer: Self-pay | Admitting: Internal Medicine

## 2023-10-23 ENCOUNTER — Encounter: Payer: Self-pay | Admitting: Skilled Nursing Facility1

## 2023-10-23 ENCOUNTER — Encounter: Payer: 59 | Attending: Obstetrics and Gynecology | Admitting: Skilled Nursing Facility1

## 2023-10-23 DIAGNOSIS — E631 Imbalance of constituents of food intake: Secondary | ICD-10-CM | POA: Insufficient documentation

## 2023-10-23 NOTE — Telephone Encounter (Signed)
 Attempted to call, left voice mail for patient to call back

## 2023-10-23 NOTE — Progress Notes (Signed)
 Medical Nutrition Therapy  Appointment Start time:  7:23  Appointment End time:  8: 10  Primary concerns today: eating healthy overall   Referral diagnosis: [employer] Employee    NUTRITION ASSESSMENT   Clinical Medical Hx: depression, OSA, anemia, anxiety Medications: see list Labs: A1C 5.9, HDL 30.20 Notable Signs/Symptoms: none reported  Lifestyle & Dietary Hx  Pt states she works with a therapist stating it is going well.  Pt states she spends the weekends hanging with her friends.   Pt arrives stating she has lost 9 pounds.  Pt states she got a B12 injection last Friday.  Pt states her A1C is down form 6.3 to 5.9.  Pt states she recognizes she needs to stretch more.  Pt state she has cut out soda and sweet tea.  Pt states she has been more mindful with lunches.   Estimated daily fluid intake:  oz Supplements: vitamin D  Sleep: 8-9 hours wakes feeling rested sometimes  Stress / self-care: 5 on a scale of 1-10; pt states she uses sleep or eating to cope  Current average weekly physical activity: ADL's  24-Hr Dietary Recall: eating out on the weekends; snacking more on the weekends First Meal: yogurt parfait or fruit or eggs  Snack:  Second Meal: drug rep meals and salad or cava bowl Snack:  Third Meal: veggie noodle pasta + chicken or [place]  Snack: sweets  Beverages: water   NUTRITION DIAGNOSIS  NI-1.5 Excessive energy intake As related to Failure to adjust for lifestyle changes.  As evidenced by pt report of emotional eating and lack of social engagement    NUTRITION INTERVENTION  Nutrition education (E-1) on the following topics:  Creation of balanced and diverse meals to increase the intake of nutrient-rich foods that provide essential vitamins, minerals, fiber, and phytonutrients Variety of Fruits and Vegetables: Aim for a colorful array of fruits and vegetables to ensure a wide range of nutrients. Include a mix of leafy greens, berries, citrus fruits, cruciferous  vegetables, and more. Whole Grains: Choose whole grains over refined grains. Examples include brown rice, quinoa, oats, whole wheat, and barley. Lean Proteins: Include lean sources of protein, such as poultry, fish, tofu, legumes, beans, lentils, and low-fat dairy products. Limit red and processed meats. Healthy Fats: Incorporate sources of healthy fats, including avocados, nuts, seeds, and olive oil. Limit saturated and trans fats found in fried and processed foods. Dairy or Dairy Alternatives: Choose low-fat or fat-free dairy products, or plant-based alternatives like almond or soy milk. Portion Control: Be mindful of portion sizes to avoid overeating. Pay attention to hunger and satisfaction cues. Limit Added Sugars: Minimize the consumption of sugary beverages, snacks, and desserts. Check food labels for added sugars and opt for natural sources of sweetness such as whole fruits. Hydration: Drink plenty of water  throughout the day. Limit sugary drinks and excessive caffeine intake. Moderate Sodium Intake: Reduce the consumption of high-sodium foods. Use herbs and spices for flavor instead of excessive salt. Meal Planning and Preparation: Plan and prepare meals ahead of time to make healthier choices more convenient. Include a mix of food groups in each meal. Limit Processed Foods: Minimize the intake of highly processed and packaged foods that are often high in added sugars, salt, and unhealthy fats. Regular Physical Activity: Combine a healthy diet with regular physical activity for overall well-being. Aim for at least 150 minutes of moderate-intensity aerobic exercise per week, along with strength training. Moderation and Balance: Enjoy treats and indulgent foods in moderation, emphasizing balance rather  than strict restriction.  Handouts Previously Provided Include  Detailed MyPlate  Learning Style & Readiness for Change Teaching method utilized: Visual & Auditory   Demonstrated degree of understanding via: Teach Back  Barriers to learning/adherence to lifestyle change: none identified   Goals Established by Pt [facility] states she forgot to check that out  I will walk 2 days a week for 30 minutes: started walking once a week  I will drink more water : accomplished  I will eat breakfast: such as yogurt or peanut butter toast: accomplished I will enhance my mindfulness with my meals  I will add one more walk in the week   MONITORING & EVALUATION Dietary intake, weekly physical activity  Next Steps  Patient is to follow up in 6 weeks.

## 2023-10-24 ENCOUNTER — Encounter: Payer: Self-pay | Admitting: Internal Medicine

## 2023-10-24 NOTE — Assessment & Plan Note (Signed)
 New.  We we will start on vitamin B12 injections

## 2023-10-25 NOTE — Telephone Encounter (Signed)
 Tried reaching pt to inform her of the providers respone "Please see instructions in the vitamin B12 prescription.  Thanks...  Give 1000 mcg of Vitamin b12 sq daily x 1 week, then weekly for 1 month, then once every 2 weeks"

## 2023-10-30 ENCOUNTER — Ambulatory Visit (INDEPENDENT_AMBULATORY_CARE_PROVIDER_SITE_OTHER): Admitting: Psychology

## 2023-10-30 DIAGNOSIS — F331 Major depressive disorder, recurrent, moderate: Secondary | ICD-10-CM

## 2023-10-30 NOTE — Progress Notes (Signed)
 [facility] Counselor/Therapist Progress Note  Patient ID: ALAIRA BOSTROM, MRN: [id number],    Date: 10/30/2023  Time Spent: 5:00pm - 5:45pm   45 minutes   Treatment Type: Individual Therapy  Reported Symptoms: stress  Mental Status Exam: Appearance:  Casual     Behavior: Appropriate  Motor: Normal  Speech/Language:  Normal Rate  Affect: Appropriate  Mood: normal  Thought process: normal  Thought content:   WNL  Sensory/Perceptual disturbances:   WNL  Orientation: oriented to person, place, time/date, and situation  Attention: Good  Concentration: Good  Memory: WNL  Fund of knowledge:  Good  Insight:   Good  Judgment:  Good  Impulse Control: Good   Risk Assessment: Danger to Self:  No Self-injurious Behavior: No Danger to Others: No Duty to Warn:no Physical Aggression / Violence:No  Access to Firearms a concern: No  Gang Involvement:No   Subjective: Pt present for face-to-face individual therapy via video.  Pt consents to telehealth video session and is aware of limitations and benefits of virtual sessions.  Location of pt: home Location of therapist: home office.   Pt talked about work.  Work has been busy and she has been given some extra responsibilities.  Pt has a lot of contact with patients which can be challenging at times.   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  She took a test to qualify for the program.  Pt got a 74 on the test.  She has applied for the program and will find out if she gets in the program in June. Pt talked about her family.  Pt's half sister does not treat pt's parents very well which upsets pt.  Helped pt process her feelings and family dynamics.   Pt is continuing to engage socially on the weekends and is trying to schedule things she can look forward to.  She helps her friend with her kids and pt is close to the family.   Pt talked about meeting with her nutritionist.  Pt's A1C was lowered which  is positive.  Pt is working on mindful eating.   Pt states that overall her mood has been stable.    Worked on self care strategies. Provided supportive therapy.  Interventions: Cognitive Behavioral Therapy and Insight-Oriented  Diagnosis: F33.1  Plan of Care: Recommend ongoing therapy.  Pt participated in setting treatment goals.   She wants to improve coping skills.  Plan to meet every two weeks.  Pt agrees with treatment plan.   Treatment Plan  (treatment plan target date: 07/23/2024). Client Abilities/Strengths  Pt is bright and motivated for therapy. Client Treatment Preferences  Individual therapy.  Client Statement of Needs  Improve coping skills.  Symptoms  Depressed or irritable mood.  Diminished interest in or enjoyment of activities.  Problems Addressed  Unipolar Depression Goals 1. Alleviate depressive symptoms and return to previous level of effective functioning. 2. Appropriately grieve the loss in order to normalize mood and to return to previously adaptive level of functioning. Objective Learn and implement behavioral strategies to overcome depression. Target Date: 2024-07-23 Frequency: Biweekly  Progress: 50 Modality: individual  Related Interventions Engage the client in "behavioral activation," increasing his/her activity level and contact with sources of reward, while identifying processes that inhibit activation.  Use behavioral techniques such as instruction, rehearsal, role-playing, role reversal, as needed, to facilitate activity in the client's daily life; reinforce success. Assist the client in developing skills that increase the  likelihood of deriving pleasure from behavioral activation (e.g., assertiveness skills, developing an exercise plan, less internal/more external focus, increased social involvement); reinforce success. Objective Identify important people in life, past and present, and describe the quality, good and poor, of those  relationships. Target Date: 2024-07-23 Frequency: Biweekly  Progress: 50 Modality: individual  Related Interventions Conduct Interpersonal Therapy beginning with the assessment of the client's "interpersonal inventory" of important past and present relationships; develop a case formulation linking depression to grief, interpersonal role disputes, role transitions, and/or interpersonal deficits). Objective Learn and implement problem-solving and decision-making skills. Target Date: 2024-07-23 Frequency: Biweekly  Progress: 50 Modality: individual  Related Interventions Conduct Problem-Solving Therapy using techniques such as psychoeducation, modeling, and role-playing to teach client problem-solving skills (i.e., defining a problem specifically, generating possible solutions, evaluating the pros and cons of each solution, selecting and implementing a plan of action, evaluating the efficacy of the plan, accepting or revising the plan); role-play application of the problem-solving skill to a real life issue. Encourage in the client the development of a positive problem orientation in which problems and solving them are viewed as a natural part of life and not something to be feared, despaired, or avoided. 3. Develop healthy interpersonal relationships that lead to the alleviation and help prevent the relapse of depression. 4. Develop healthy thinking patterns and beliefs about self, others, and the world that lead to the alleviation and help prevent the relapse of depression. 5. Recognize, accept, and cope with feelings of depression. Diagnosis F33.1  Conditions For Discharge Achievement of treatment goals and objectives   Willey Harrier, LCSW

## 2023-10-31 ENCOUNTER — Other Ambulatory Visit (HOSPITAL_COMMUNITY): Payer: Self-pay

## 2023-11-02 ENCOUNTER — Other Ambulatory Visit (HOSPITAL_COMMUNITY): Payer: Self-pay

## 2023-11-02 ENCOUNTER — Other Ambulatory Visit: Payer: Self-pay

## 2023-11-02 ENCOUNTER — Ambulatory Visit: Payer: Self-pay

## 2023-11-02 DIAGNOSIS — E538 Deficiency of other specified B group vitamins: Secondary | ICD-10-CM

## 2023-11-02 MED ORDER — CYANOCOBALAMIN 1000 MCG/ML IJ SOLN
INTRAMUSCULAR | 6 refills | Status: AC
  Filled 2023-11-02: qty 10, 28d supply, fill #0
  Filled 2023-11-27: qty 10, 35d supply, fill #1

## 2023-11-02 NOTE — Telephone Encounter (Signed)
Rx has been sent into pt's pharmacy.  

## 2023-11-02 NOTE — Telephone Encounter (Signed)
  No triage:  Patient is calling in regarding her cyanocobalamin  (VITAMIN B12) 1000 MCG/ML injection. Pharmacy is stating that they never received the script for this medication and that it needs to be resent. Please call patient back with the soonest update.       Copied from CRM [id number]. Topic: Clinical - Prescription Issue >> Nov 02, 2023 12:33 PM Kendra Allen wrote: Reason for CRM: Patient is calling in regarding her cyanocobalamin  (VITAMIN B12) 1000 MCG/ML injection. Pharmacy is stating that they never received the script for this medication and that it needs to be resent. Please call patient back with the soonest update. Reason for Disposition  [1] Caller requesting NON-URGENT health information AND [2] PCP's office is the best resource  Answer Assessment - Initial Assessment Questions 1. REASON FOR CALL or QUESTION: "What is your reason for calling today?" or "How can I best help you?" or "What question do you have that I can help answer?"     Patient is calling in regarding her cyanocobalamin  (VITAMIN B12) 1000 MCG/ML injection. Pharmacy is stating that they never received the script for this medication and that it needs to be resent. Please call patient back with the soonest update.  Protocols used: Information Only Call - No Triage-A-AH

## 2023-11-03 ENCOUNTER — Other Ambulatory Visit (HOSPITAL_COMMUNITY): Payer: Self-pay

## 2023-11-03 ENCOUNTER — Other Ambulatory Visit: Payer: Self-pay

## 2023-11-05 ENCOUNTER — Other Ambulatory Visit (HOSPITAL_COMMUNITY): Payer: Self-pay

## 2023-11-05 MED ORDER — NORETHINDRONE ACETATE 5 MG PO TABS
15.0000 mg | ORAL_TABLET | Freq: Every day | ORAL | 1 refills | Status: DC
  Filled 2023-11-05: qty 270, 90d supply, fill #0

## 2023-11-10 ENCOUNTER — Other Ambulatory Visit (HOSPITAL_BASED_OUTPATIENT_CLINIC_OR_DEPARTMENT_OTHER): Payer: Self-pay

## 2023-11-10 ENCOUNTER — Other Ambulatory Visit (HOSPITAL_COMMUNITY): Payer: Self-pay

## 2023-11-22 ENCOUNTER — Encounter: Payer: Self-pay | Admitting: Skilled Nursing Facility1

## 2023-11-22 ENCOUNTER — Encounter: Attending: Internal Medicine | Admitting: Skilled Nursing Facility1

## 2023-11-22 DIAGNOSIS — E631 Imbalance of constituents of food intake: Secondary | ICD-10-CM | POA: Insufficient documentation

## 2023-11-22 NOTE — Progress Notes (Signed)
 Medical Nutrition Therapy   Primary concerns today: eating healthy overall   Referral diagnosis: [employer] Employee    NUTRITION ASSESSMENT   Clinical Medical Hx: depression, OSA, anemia, anxiety Medications: see list Labs: A1C 5.9, HDL 30.20 Notable Signs/Symptoms: none reported  Lifestyle & Dietary Hx   Pt states she continues to make mindful choices and feels she just needs to refocus on increased physical activity.   Estimated daily fluid intake:  oz Supplements: vitamin D  Sleep: 8-9 hours wakes feeling rested sometimes  Stress / self-care: 5 on a scale of 1-10; pt states she uses sleep or eating to cope  Current average weekly physical activity: ADL's  24-Hr Dietary Recall: eating out on the weekends; snacking more on the weekends First Meal: yogurt parfait or fruit or eggs or cold cereal Snack:  Second Meal: drug rep meals and salad or cava bowl Snack:  Third Meal: veggie noodle pasta + chicken or [place]  Snack: sweets  Beverages: water   NUTRITION DIAGNOSIS  NI-1.5 Excessive energy intake As related to Failure to adjust for lifestyle changes.  As evidenced by pt report of emotional eating and lack of social engagement    NUTRITION INTERVENTION  Nutrition education (E-1) on the following topics:  Creation of balanced and diverse meals to increase the intake of nutrient-rich foods that provide essential vitamins, minerals, fiber, and phytonutrients Variety of Fruits and Vegetables: Aim for a colorful array of fruits and vegetables to ensure a wide range of nutrients. Include a mix of leafy greens, berries, citrus fruits, cruciferous vegetables, and more. Whole Grains: Choose whole grains over refined grains. Examples include brown rice, quinoa, oats, whole wheat, and barley. Lean Proteins: Include lean sources of protein, such as poultry, fish, tofu, legumes, beans, lentils, and low-fat dairy products. Limit red and processed meats. Healthy Fats: Incorporate sources of  healthy fats, including avocados, nuts, seeds, and olive oil. Limit saturated and trans fats found in fried and processed foods. Dairy or Dairy Alternatives: Choose low-fat or fat-free dairy products, or plant-based alternatives like almond or soy milk. Portion Control: Be mindful of portion sizes to avoid overeating. Pay attention to hunger and satisfaction cues. Limit Added Sugars: Minimize the consumption of sugary beverages, snacks, and desserts. Check food labels for added sugars and opt for natural sources of sweetness such as whole fruits. Hydration: Drink plenty of water  throughout the day. Limit sugary drinks and excessive caffeine intake. Moderate Sodium Intake: Reduce the consumption of high-sodium foods. Use herbs and spices for flavor instead of excessive salt. Meal Planning and Preparation: Plan and prepare meals ahead of time to make healthier choices more convenient. Include a mix of food groups in each meal. Limit Processed Foods: Minimize the intake of highly processed and packaged foods that are often high in added sugars, salt, and unhealthy fats. Regular Physical Activity: Combine a healthy diet with regular physical activity for overall well-being. Aim for at least 150 minutes of moderate-intensity aerobic exercise per week, along with strength training. Moderation and Balance: Enjoy treats and indulgent foods in moderation, emphasizing balance rather than strict restriction.  Handouts Previously Provided Include  Detailed MyPlate  Learning Style & Readiness for Change Teaching method utilized: Visual & Auditory  Demonstrated degree of understanding via: Teach Back  Barriers to learning/adherence to lifestyle change: none identified   Goals Established by Pt [facility] states she forgot to check that out  I will walk 2 days a week for  30 minutes: started walking once a week  I will drink more water : accomplished   I will eat breakfast: such as yogurt or peanut butter toast: accomplished I will enhance my mindfulness with my meals  I will add one more walk in the week: reengaged    MONITORING & EVALUATION Dietary intake, weekly physical activity  Next Steps  Patient is to follow up as desired

## 2023-11-26 ENCOUNTER — Ambulatory Visit (INDEPENDENT_AMBULATORY_CARE_PROVIDER_SITE_OTHER): Admitting: Psychology

## 2023-11-26 DIAGNOSIS — F331 Major depressive disorder, recurrent, moderate: Secondary | ICD-10-CM

## 2023-11-26 NOTE — Progress Notes (Signed)
 [facility] Counselor/Therapist Progress Note  Patient ID: AVAYAH RAFFETY, MRN: [id number],    Date: 11/26/2023  Time Spent: 5:00pm - 5:45pm   45 minutes   Treatment Type: Individual Therapy  Reported Symptoms: stress  Mental Status Exam: Appearance:  Casual     Behavior: Appropriate  Motor: Normal  Speech/Language:  Normal Rate  Affect: Appropriate  Mood: normal  Thought process: normal  Thought content:   WNL  Sensory/Perceptual disturbances:   WNL  Orientation: oriented to person, place, time/date, and situation  Attention: Good  Concentration: Good  Memory: WNL  Fund of knowledge:  Good  Insight:   Good  Judgment:  Good  Impulse Control: Good   Risk Assessment: Danger to Self:  No Self-injurious Behavior: No Danger to Others: No Duty to Warn:no Physical Aggression / Violence:No  Access to Firearms a concern: No  Gang Involvement:No   Subjective: Pt present for face-to-face individual therapy via video.  Pt consents to telehealth video session and is aware of limitations and benefits of virtual sessions.  Location of pt: home Location of therapist: home office.   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  She should hear if she is accepted into the program in June.   It is hard for pt to wait to hear but she is trying to be patient.  Pt talked about her health.   She has low B12 so has to give herself daily injections.    Pt continues to focus on healthy nutrition and is working on lowering her A1C.  Pt is working on mindful eating.  She is still having difficulty incorporating exercise into her schedule.  Worked with pt on how she can add in exercise in small steps.   Pt has been very busy socially on the weekends.   She has been intentional about staying busy and spending time with friends.  Pt states that overall her mood has been stable.    Worked on self care strategies. Provided  supportive therapy.  Interventions: Cognitive Behavioral Therapy and Insight-Oriented  Diagnosis: F33.1  Plan of Care: Recommend ongoing therapy.  Pt participated in setting treatment goals.   She wants to improve coping skills.  Plan to meet every two weeks.  Pt agrees with treatment plan.   Treatment Plan  (treatment plan target date: 07/23/2024). Client Abilities/Strengths  Pt is bright and motivated for therapy. Client Treatment Preferences  Individual therapy.  Client Statement of Needs  Improve coping skills.  Symptoms  Depressed or irritable mood.  Diminished interest in or enjoyment of activities.  Problems Addressed  Unipolar Depression Goals 1. Alleviate depressive symptoms and return to previous level of effective functioning. 2. Appropriately grieve the loss in order to normalize mood and to return to previously adaptive level of functioning. Objective Learn and implement behavioral strategies to overcome depression. Target Date: 2024-07-23 Frequency: Biweekly  Progress: 50 Modality: individual  Related Interventions Engage the client in "behavioral activation," increasing his/her activity level and contact with sources of reward, while identifying processes that inhibit activation.  Use behavioral techniques such as instruction, rehearsal, role-playing, role reversal, as needed, to facilitate activity in the client's daily life; reinforce success. Assist the client in developing skills that increase the likelihood of deriving pleasure from behavioral activation (e.g., assertiveness skills, developing an exercise plan, less internal/more external focus, increased social involvement); reinforce success. Objective Identify important people in life, past and  present, and describe the quality, good and poor, of those relationships. Target Date: 2024-07-23 Frequency: Biweekly  Progress: 50 Modality: individual  Related Interventions Conduct Interpersonal Therapy beginning  with the assessment of the client's "interpersonal inventory" of important past and present relationships; develop a case formulation linking depression to grief, interpersonal role disputes, role transitions, and/or interpersonal deficits). Objective Learn and implement problem-solving and decision-making skills. Target Date: 2024-07-23 Frequency: Biweekly  Progress: 50 Modality: individual  Related Interventions Conduct Problem-Solving Therapy using techniques such as psychoeducation, modeling, and role-playing to teach client problem-solving skills (i.e., defining a problem specifically, generating possible solutions, evaluating the pros and cons of each solution, selecting and implementing a plan of action, evaluating the efficacy of the plan, accepting or revising the plan); role-play application of the problem-solving skill to a real life issue. Encourage in the client the development of a positive problem orientation in which problems and solving them are viewed as a natural part of life and not something to be feared, despaired, or avoided. 3. Develop healthy interpersonal relationships that lead to the alleviation and help prevent the relapse of depression. 4. Develop healthy thinking patterns and beliefs about self, others, and the world that lead to the alleviation and help prevent the relapse of depression. 5. Recognize, accept, and cope with feelings of depression. Diagnosis F33.1  Conditions For Discharge Achievement of treatment goals and objectives   Willey Harrier, LCSW

## 2023-11-28 ENCOUNTER — Other Ambulatory Visit (HOSPITAL_COMMUNITY): Payer: Self-pay

## 2023-12-18 ENCOUNTER — Other Ambulatory Visit (HOSPITAL_COMMUNITY): Payer: Self-pay

## 2023-12-18 ENCOUNTER — Ambulatory Visit (INDEPENDENT_AMBULATORY_CARE_PROVIDER_SITE_OTHER): Admitting: Psychology

## 2023-12-18 ENCOUNTER — Other Ambulatory Visit: Payer: Self-pay

## 2023-12-18 DIAGNOSIS — F331 Major depressive disorder, recurrent, moderate: Secondary | ICD-10-CM | POA: Diagnosis not present

## 2023-12-18 NOTE — Progress Notes (Signed)
 [facility] Counselor/Therapist Progress Note  Patient ID: Kendra Allen, MRN: [id number],    Date: 12/18/2023  Time Spent: 5:00pm - 5:45pm   45 minutes   Treatment Type: Individual Therapy  Reported Symptoms: stress  Mental Status Exam: Appearance:  Casual     Behavior: Appropriate  Motor: Normal  Speech/Language:  Normal Rate  Affect: Appropriate  Mood: normal  Thought process: normal  Thought content:   WNL  Sensory/Perceptual disturbances:   WNL  Orientation: oriented to person, place, time/date, and situation  Attention: Good  Concentration: Good  Memory: WNL  Fund of knowledge:  Good  Insight:   Good  Judgment:  Good  Impulse Control: Good   Risk Assessment: Danger to Self:  No Self-injurious Behavior: No Danger to Others: No Duty to Warn:no Physical Aggression / Violence:No  Access to Firearms a concern: No  Gang Involvement:No   Subjective: Pt present for face-to-face individual therapy via video.  Pt consents to telehealth video session and is aware of limitations and benefits of virtual sessions.  Location of pt: home Location of therapist: home office.   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  She should hear if she is accepted into the program next week.   It is hard for pt to wait to hear but she is trying to be patient.  Next Friday is orientation so the turn around time is quick.  This is stressful for pt and she is feeling anxious.   Addressed pt's stress and worked on calming strategies.   Pt talked about her health.   Pt continues to focus on healthy nutrition and is working on lowering her A1C.  Pt is working on mindful eating.  She is incorporating exercise into her schedule.  She has been walking and going to the gym.   Pt talked about work.  It has been very busy and stressful at times.   Worked on Pharmacologist.  Pt is going to volunteer at church at a young adult  conference.   Pt has plans to go to [place] with her parents for the 4th of July weekend.   Pt states that overall her mood has been stable.    Worked on self care strategies. Provided supportive therapy.  Interventions: Cognitive Behavioral Therapy and Insight-Oriented  Diagnosis: F33.1  Plan of Care: Recommend ongoing therapy.  Pt participated in setting treatment goals.   She wants to improve coping skills.  Plan to meet every two weeks.  Pt agrees with treatment plan.   Treatment Plan  (treatment plan target date: 07/23/2024). Client Abilities/Strengths  Pt is bright and motivated for therapy. Client Treatment Preferences  Individual therapy.  Client Statement of Needs  Improve coping skills.  Symptoms  Depressed or irritable mood.  Diminished interest in or enjoyment of activities.  Problems Addressed  Unipolar Depression Goals 1. Alleviate depressive symptoms and return to previous level of effective functioning. 2. Appropriately grieve the loss in order to normalize mood and to return to previously adaptive level of functioning. Objective Learn and implement behavioral strategies to overcome depression. Target Date: 2024-07-23 Frequency: Biweekly  Progress: 50 Modality: individual  Related Interventions Engage the client in behavioral activation, increasing his/her activity level and contact with sources of reward, while identifying processes that inhibit activation.  Use behavioral techniques such as instruction, rehearsal, role-playing, role reversal, as needed, to facilitate activity in the client's daily  life; reinforce success. Assist the client in developing skills that increase the likelihood of deriving pleasure from behavioral activation (e.g., assertiveness skills, developing an exercise plan, less internal/more external focus, increased social involvement); reinforce success. Objective Identify important people in life, past and present, and describe the  quality, good and poor, of those relationships. Target Date: 2024-07-23 Frequency: Biweekly  Progress: 50 Modality: individual  Related Interventions Conduct Interpersonal Therapy beginning with the assessment of the client's interpersonal inventory of important past and present relationships; develop a case formulation linking depression to grief, interpersonal role disputes, role transitions, and/or interpersonal deficits). Objective Learn and implement problem-solving and decision-making skills. Target Date: 2024-07-23 Frequency: Biweekly  Progress: 50 Modality: individual  Related Interventions Conduct Problem-Solving Therapy using techniques such as psychoeducation, modeling, and role-playing to teach client problem-solving skills (i.e., defining a problem specifically, generating possible solutions, evaluating the pros and cons of each solution, selecting and implementing a plan of action, evaluating the efficacy of the plan, accepting or revising the plan); role-play application of the problem-solving skill to a real life issue. Encourage in the client the development of a positive problem orientation in which problems and solving them are viewed as a natural part of life and not something to be feared, despaired, or avoided. 3. Develop healthy interpersonal relationships that lead to the alleviation and help prevent the relapse of depression. 4. Develop healthy thinking patterns and beliefs about self, others, and the world that lead to the alleviation and help prevent the relapse of depression. 5. Recognize, accept, and cope with feelings of depression. Diagnosis F33.1  Conditions For Discharge Achievement of treatment goals and objectives   Veva Alma, LCSW

## 2023-12-26 DIAGNOSIS — D219 Benign neoplasm of connective and other soft tissue, unspecified: Secondary | ICD-10-CM | POA: Diagnosis not present

## 2023-12-26 DIAGNOSIS — D252 Subserosal leiomyoma of uterus: Secondary | ICD-10-CM | POA: Diagnosis not present

## 2023-12-28 ENCOUNTER — Encounter: Payer: Self-pay | Admitting: Internal Medicine

## 2024-01-07 ENCOUNTER — Ambulatory Visit: Payer: 59 | Admitting: Obstetrics and Gynecology

## 2024-01-07 ENCOUNTER — Other Ambulatory Visit (HOSPITAL_COMMUNITY): Payer: Self-pay

## 2024-01-07 ENCOUNTER — Encounter: Payer: Self-pay | Admitting: Obstetrics and Gynecology

## 2024-01-07 VITALS — BP 110/70 | HR 100 | Temp 97.7°F | Wt 276.8 lb

## 2024-01-07 DIAGNOSIS — Z9189 Other specified personal risk factors, not elsewhere classified: Secondary | ICD-10-CM | POA: Diagnosis not present

## 2024-01-07 DIAGNOSIS — Z6841 Body Mass Index (BMI) 40.0 and over, adult: Secondary | ICD-10-CM

## 2024-01-07 DIAGNOSIS — R7303 Prediabetes: Secondary | ICD-10-CM

## 2024-01-07 DIAGNOSIS — E66813 Obesity, class 3: Secondary | ICD-10-CM

## 2024-01-07 DIAGNOSIS — F331 Major depressive disorder, recurrent, moderate: Secondary | ICD-10-CM | POA: Diagnosis not present

## 2024-01-07 DIAGNOSIS — F419 Anxiety disorder, unspecified: Secondary | ICD-10-CM | POA: Diagnosis not present

## 2024-01-07 DIAGNOSIS — E282 Polycystic ovarian syndrome: Secondary | ICD-10-CM | POA: Diagnosis not present

## 2024-01-07 DIAGNOSIS — N6311 Unspecified lump in the right breast, upper outer quadrant: Secondary | ICD-10-CM

## 2024-01-07 MED ORDER — VRAYLAR 3 MG PO CAPS
3.0000 mg | ORAL_CAPSULE | Freq: Every day | ORAL | 2 refills | Status: DC
  Filled 2024-02-11: qty 30, 30d supply, fill #0
  Filled 2024-03-20: qty 30, 30d supply, fill #1

## 2024-01-07 MED ORDER — TRINTELLIX 20 MG PO TABS
20.0000 mg | ORAL_TABLET | Freq: Every day | ORAL | 2 refills | Status: DC
  Filled 2024-01-07 – 2024-01-28 (×2): qty 30, 30d supply, fill #0
  Filled 2024-03-26: qty 30, 30d supply, fill #1

## 2024-01-07 MED ORDER — MODAFINIL 200 MG PO TABS
200.0000 mg | ORAL_TABLET | Freq: Every morning | ORAL | 2 refills | Status: DC
  Filled 2024-01-07 – 2024-01-28 (×2): qty 30, 30d supply, fill #0
  Filled 2024-03-11: qty 30, 30d supply, fill #1
  Filled 2024-04-13: qty 30, 30d supply, fill #2

## 2024-01-07 MED ORDER — HYDROXYZINE PAMOATE 25 MG PO CAPS
25.0000 mg | ORAL_CAPSULE | Freq: Every evening | ORAL | 2 refills | Status: DC
  Filled 2024-01-07: qty 30, 30d supply, fill #0

## 2024-01-07 NOTE — Progress Notes (Signed)
 28 y.o. G0P0000 female with PCOS, HS (followed by Derm, Dr. Constantino), stage IV endometriosis (followed at [facility], s/p RA-RSO, myomectomy with Gyn ONC in 2022), AUB-F (on Aygestin ), high risk of breast cancer (20%, s/p genetic counseling 2022, negative BRCA testing, recommended mammograms at 31 and breast MRI's at 26), morbid obesity, prediabetes here for breast follow-up. Single.  Works Psychologist, sport and exercise at [employer].   No LMP recorded. (Menstrual status: Oral contraceptives).   Menarche at 11 years.  She wants to discuss weight loss management.  Working with RD.  Improved A1c from 6.5 >5.9! Had recent follow-up with [facility].  Stable transvaginal ultrasound.  Continue on Aygestin  at this time.  07/08/23 Breast MRI: BIRADS 1, density a 07/13/23: BI-RADS CATEGORY 3: Probably benign. F/u in 3 months.  GYN HISTORY: PCOS HS 12/2020: Stage IV endometriosis (Ra-RSO and RSO and myomectomy with Dr. Viktoria (Gyn Onc))  High risk of breast cancer (20%, s/p genetic counseling 2022, negative BRCA testing)  OB History  Gravida Para Term Preterm AB Living  0 0 0 0 0 0  SAB IAB Ectopic Multiple Live Births  0 0 0 0 0    Past Medical History:  Diagnosis Date   Anemia    Anxiety    Chest pain    Constipation    Cyst of right ovary 12/14/2020   Depression    Endometriosis of pelvis    Family history of breast cancer    Family history of colon cancer    Family history of ovarian cancer    Family history of prostate cancer    Hidradenitis suppurativa    PCOS (polycystic ovarian syndrome)    Prediabetes    Sleep apnea    Vitamin D  deficiency     Past Surgical History:  Procedure Laterality Date   CERVICAL POLYPECTOMY     DILATATION & CURETTAGE/HYSTEROSCOPY WITH MYOSURE N/A 11/21/2016   Procedure: DILATATION & CURETTAGE/HYSTEROSCOPY WITH MYOSURE;  Surgeon: Rutherford Gain, MD;  Location: [facility];  Service: Gynecology;  [name];   ROBOTIC ASSISTED LAPAROSCOPIC OVARIAN  CYSTECTOMY N/A 12/30/2020   Procedure: XI ROBOTIC ASSISTED LAPAROSCOPIC RIGHT SALPINGO-OOPHORECTOMY WITH PELVIC WASHINGS;  Surgeon: Viktoria Comer SAUNDERS, MD;  [name];  Service: Gynecology;  [name];   WISDOM TOOTH EXTRACTION      Current Outpatient Medications on File Prior to Visit  Medication Sig Dispense Refill   cariprazine  (VRAYLAR ) 3 MG capsule Take 1 capsule (3 mg total) by mouth daily. 30 capsule 2   clindamycin  (CLEOCIN  T) 1 % external solution Apply 1 Application topically 1 (one) - 2 (two) times daily. 30 mL 5   cyanocobalamin  (VITAMIN B12) 1000 MCG/ML injection Inject 1 mL (1,000 mcg total) into the skin daily for 7 days, THEN 1 mL (1,000 mcg total) once a week for 28 days, THEN 1 mL (1,000 mcg total) every 14 (fourteen) days for 28 days. 10 mL 6   hydrOXYzine  (VISTARIL ) 25 MG capsule Take 1 capsule (25 mg total) by mouth every evening. 30 capsule 2   modafinil  (PROVIGIL ) 200 MG tablet Take 1 tablet (200 mg total) by mouth in the morning. 30 tablet 1   norethindrone  (AYGESTIN ) 5 MG tablet Take 3 tablets (15 mg total) by mouth daily. 270 tablet 1   Secukinumab (COSENTYX UNOREADY) 300 MG/2ML SOAJ as directed Subcutaneous one under skin every 4 weeks; Duration: 30 days     VITAMIN D  PO Take by mouth.     vortioxetine  HBr (TRINTELLIX ) 20  MG TABS tablet Take 1 tablet (20 mg total) by mouth daily. 30 tablet 2   No current facility-administered medications on file prior to visit.    Allergies  Allergen Reactions   Doxycycline      Upset stomach   Sulfamethoxazole-Trimethoprim     upset stomach    PE Today's Vitals   01/07/24 1450  BP: 110/70  Pulse: 100  Temp: 97.7 F (36.5 C)  TempSrc: Oral  SpO2: 97%  Weight: 276 lb 12.8 oz (125.6 kg)   Body mass index is 44.68 kg/m.  Physical Exam Vitals reviewed.  Constitutional:      General: She is not in acute distress.    Appearance: Normal appearance.  HENT:     Head: Normocephalic and atraumatic.      Nose: Nose normal.  Eyes:     Extraocular Movements: Extraocular movements intact.     Conjunctiva/sclera: Conjunctivae normal.  Pulmonary:     Effort: Pulmonary effort is normal.  Chest:  Breasts:    Right: Normal. No mass, nipple discharge, skin change or tenderness.     Left: Normal. No mass, nipple discharge, skin change or tenderness.  Musculoskeletal:        General: Normal range of motion.     Cervical back: Normal range of motion.  Lymphadenopathy:     Upper Body:     Right upper body: No axillary adenopathy.     Left upper body: No axillary adenopathy.  Neurological:     General: No focal deficit present.     Mental Status: She is alert.  Psychiatric:        Mood and Affect: Mood normal.        Behavior: Behavior normal.      Assessment and Plan:        PCOS (polycystic ovarian syndrome) Prediabetes Class 3 severe obesity with serious comorbidity and body mass index (BMI) of 45.0 to 49.9 in adult, unspecified obesity type (HCC) Continue with working with RD to improve A1c  follow-up with PCP for weight loss meds.  At high risk for breast cancer Mass of upper outer quadrant of right breast Assessment & Plan: Normal exam today Due for interval ultrasound, number for breast imaging center provided   Vera LULLA Pa, MD

## 2024-01-08 ENCOUNTER — Other Ambulatory Visit: Payer: Self-pay

## 2024-01-14 ENCOUNTER — Ambulatory Visit (INDEPENDENT_AMBULATORY_CARE_PROVIDER_SITE_OTHER): Admitting: Psychology

## 2024-01-14 DIAGNOSIS — F331 Major depressive disorder, recurrent, moderate: Secondary | ICD-10-CM

## 2024-01-14 NOTE — Progress Notes (Signed)
 [facility] Counselor/Therapist Progress Note  Patient ID: SARETTA DAHLEM, MRN: [id number],    Date: 01/14/2024  Time Spent: 5:00pm - 5:45pm   45 minutes   Treatment Type: Individual Therapy  Reported Symptoms: stress  Mental Status Exam: Appearance:  Casual     Behavior: Appropriate  Motor: Normal  Speech/Language:  Normal Rate  Affect: Appropriate  Mood: normal  Thought process: normal  Thought content:   WNL  Sensory/Perceptual disturbances:   WNL  Orientation: oriented to person, place, time/date, and situation  Attention: Good  Concentration: Good  Memory: WNL  Fund of knowledge:  Good  Insight:   Good  Judgment:  Good  Impulse Control: Good   Risk Assessment: Danger to Self:  No Self-injurious Behavior: No Danger to Others: No Duty to Warn:no Physical Aggression / Violence:No  Access to Firearms a concern: No  Gang Involvement:No   Subjective: Pt present for face-to-face individual therapy via video.  Pt consents to telehealth video session and is aware of limitations and benefits of virtual sessions.  Location of pt: home Location of therapist: home office.   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   Pt is very disappointed but plans to try again next year to get into the program.  Pt plans to take some classes to increase her chances of getting into the program next year.   Addressed pt's disappointment.  Helped pt process her feelings.  Pt talked about her health.   Pt continues to focus on healthy nutrition and is working on lowering her A1C.  Pt is working on mindful eating.  She is incorporating exercise into her schedule.  She has been walking and going to the gym.   Pt talked about work.  It has been very busy and stressful at times.   Worked on Pharmacologist.  Pt states that overall her mood has been stable.   Pt is keeping busy with social activities with friends.   Worked on self  care strategies. Provided supportive therapy.  Interventions: Cognitive Behavioral Therapy and Insight-Oriented  Diagnosis: F33.1  Plan of Care: Recommend ongoing therapy.  Pt participated in setting treatment goals.   She wants to improve coping skills.  Plan to meet every two weeks.  Pt agrees with treatment plan.   Treatment Plan  (treatment plan target date: 07/23/2024). Client Abilities/Strengths  Pt is bright and motivated for therapy. Client Treatment Preferences  Individual therapy.  Client Statement of Needs  Improve coping skills.  Symptoms  Depressed or irritable mood.  Diminished interest in or enjoyment of activities.  Problems Addressed  Unipolar Depression Goals 1. Alleviate depressive symptoms and return to previous level of effective functioning. 2. Appropriately grieve the loss in order to normalize mood and to return to previously adaptive level of functioning. Objective Learn and implement behavioral strategies to overcome depression. Target Date: 2024-07-23 Frequency: Biweekly  Progress: 50 Modality: individual  Related Interventions Engage the client in behavioral activation, increasing his/her activity level and contact with sources of reward, while identifying processes that inhibit activation.  Use behavioral techniques such as instruction, rehearsal, role-playing, role reversal, as needed, to facilitate activity in the client's daily life; reinforce success. Assist the client in developing skills that increase the likelihood of deriving pleasure from behavioral activation (e.g., assertiveness skills, developing an exercise plan, less internal/more external focus, increased social involvement); reinforce success. Objective Identify important people in life, past  and present, and describe the quality, good and poor, of those relationships. Target Date: 2024-07-23 Frequency: Biweekly  Progress: 50 Modality: individual  Related Interventions Conduct  Interpersonal Therapy beginning with the assessment of the client's interpersonal inventory of important past and present relationships; develop a case formulation linking depression to grief, interpersonal role disputes, role transitions, and/or interpersonal deficits). Objective Learn and implement problem-solving and decision-making skills. Target Date: 2024-07-23 Frequency: Biweekly  Progress: 50 Modality: individual  Related Interventions Conduct Problem-Solving Therapy using techniques such as psychoeducation, modeling, and role-playing to teach client problem-solving skills (i.e., defining a problem specifically, generating possible solutions, evaluating the pros and cons of each solution, selecting and implementing a plan of action, evaluating the efficacy of the plan, accepting or revising the plan); role-play application of the problem-solving skill to a real life issue. Encourage in the client the development of a positive problem orientation in which problems and solving them are viewed as a natural part of life and not something to be feared, despaired, or avoided. 3. Develop healthy interpersonal relationships that lead to the alleviation and help prevent the relapse of depression. 4. Develop healthy thinking patterns and beliefs about self, others, and the world that lead to the alleviation and help prevent the relapse of depression. 5. Recognize, accept, and cope with feelings of depression. Diagnosis F33.1  Conditions For Discharge Achievement of treatment goals and objectives   Veva Alma, LCSW

## 2024-01-17 ENCOUNTER — Other Ambulatory Visit (HOSPITAL_COMMUNITY): Payer: Self-pay

## 2024-01-18 ENCOUNTER — Ambulatory Visit
Admission: RE | Admit: 2024-01-18 | Discharge: 2024-01-18 | Disposition: A | Discharge status: Home / Self Care | Source: Ambulatory Visit | Attending: Obstetrics and Gynecology | Admitting: Obstetrics and Gynecology

## 2024-01-18 DIAGNOSIS — N6489 Other specified disorders of breast: Secondary | ICD-10-CM | POA: Diagnosis not present

## 2024-01-18 DIAGNOSIS — N641 Fat necrosis of breast: Secondary | ICD-10-CM

## 2024-01-21 ENCOUNTER — Ambulatory Visit: Payer: Self-pay | Admitting: Obstetrics and Gynecology

## 2024-01-21 DIAGNOSIS — Z9189 Other specified personal risk factors, not elsewhere classified: Secondary | ICD-10-CM

## 2024-01-22 ENCOUNTER — Other Ambulatory Visit: Payer: Self-pay

## 2024-01-22 ENCOUNTER — Other Ambulatory Visit (HOSPITAL_COMMUNITY): Payer: Self-pay

## 2024-01-22 MED ORDER — COSENTYX UNOREADY 300 MG/2ML ~~LOC~~ SOAJ
300.0000 mg | SUBCUTANEOUS | 3 refills | Status: DC
  Filled 2024-01-22: qty 2, 28d supply, fill #0

## 2024-01-22 NOTE — Progress Notes (Signed)
 Patient to be enrolled with [facility]. Routed to Tiffany.

## 2024-01-23 ENCOUNTER — Ambulatory Visit: Attending: Internal Medicine | Admitting: Pharmacist

## 2024-01-23 ENCOUNTER — Other Ambulatory Visit: Payer: Self-pay | Admitting: Pharmacist

## 2024-01-23 ENCOUNTER — Encounter: Payer: Self-pay | Admitting: Pharmacist

## 2024-01-23 ENCOUNTER — Other Ambulatory Visit: Payer: Self-pay

## 2024-01-23 ENCOUNTER — Other Ambulatory Visit (HOSPITAL_COMMUNITY): Payer: Self-pay

## 2024-01-23 DIAGNOSIS — Z7189 Other specified counseling: Secondary | ICD-10-CM

## 2024-01-23 MED ORDER — COSENTYX UNOREADY 300 MG/2ML ~~LOC~~ SOAJ
300.0000 mg | SUBCUTANEOUS | 3 refills | Status: DC
  Filled 2024-01-23 – 2024-01-29 (×2): qty 2, 28d supply, fill #0
  Filled 2024-02-21: qty 2, 28d supply, fill #1
  Filled 2024-03-24: qty 2, 28d supply, fill #2
  Filled 2024-04-16: qty 2, 28d supply, fill #3

## 2024-01-23 NOTE — Progress Notes (Signed)
   S: Patient presents today for review of their specialty medication.   Patient is about to start Cosentyx  (secukinumab ) for psoriasis. Patient is managed by Dr. Tricia for this.   Dosing: Psoriasis: SubQ: 300 mg once weekly at weeks 0, 1, 2, 3, and 4 followed by 300 mg every 4 weeks. Some patients may only require 150 mg per dose.  Adherence: has not started   Efficacy: has not started   Current adverse effects: S/sx of infection: none  GI upset: none  Headache: none  S/sx of hypersensitivity: none     O:     Lab Results  Component Value Date   WBC 9.3 10/11/2023   HGB 13.1 10/11/2023   HCT 38.7 10/11/2023   MCV 87.9 10/11/2023   PLT 363.0 10/11/2023      Chemistry      Component Value Date/Time   NA 139 10/11/2023 1632   NA 142 12/11/2018 0939   K 3.7 10/11/2023 1632   CL 106 10/11/2023 1632   CO2 26 10/11/2023 1632   BUN 8 10/11/2023 1632   BUN 8 12/11/2018 0939   CREATININE 0.59 10/11/2023 1632      Component Value Date/Time   CALCIUM 9.2 10/11/2023 1632   ALKPHOS 59 10/11/2023 1632   AST 16 10/11/2023 1632   ALT 24 10/11/2023 1632   BILITOT 0.4 10/11/2023 1632   BILITOT 0.4 12/11/2018 0939       A/P: 1. Medication review: Patient is currently on Cosentyx  for psoriasis. Reviewed the medication with the patient, including the following: Cosentyx  is a monoclonal antibody used in the treatment of ankylosing spondylitis, psoriasis, and psoriatic arthritis. The injection is subq and the medication should be allowed to reach room temp prior to injecting. Injection sites should be rotated. Possible adverse effects include headaches, GI upset, increased risk of infection and hypersensitivity reactions. No recommendations for any changes at this time.  Herlene Fleeta Morris, PharmD, JAQUELINE, CPP Clinical Pharmacist [facility] [phone]

## 2024-01-23 NOTE — Progress Notes (Signed)
 Pharmacy Patient Advocate Encounter  Insurance verification completed.   The patient is insured through [facility]   Ran test claim for Cosentyx . Co-pay is $0. Patient has copay card.  Pharmacy has to call [phone] to receive one-time credit card for remainder of copay every time.   This test claim was processed through [facility]- copay amounts may vary at other pharmacies due to pharmacy/plan contracts, or as the patient moves through the different stages of their insurance plan.

## 2024-01-24 ENCOUNTER — Other Ambulatory Visit: Payer: Self-pay

## 2024-01-25 ENCOUNTER — Other Ambulatory Visit: Payer: Self-pay

## 2024-01-28 ENCOUNTER — Other Ambulatory Visit (HOSPITAL_COMMUNITY): Payer: Self-pay

## 2024-01-28 ENCOUNTER — Other Ambulatory Visit: Payer: Self-pay

## 2024-01-29 ENCOUNTER — Other Ambulatory Visit: Payer: Self-pay

## 2024-01-29 ENCOUNTER — Other Ambulatory Visit (HOSPITAL_COMMUNITY): Payer: Self-pay

## 2024-01-29 NOTE — Progress Notes (Signed)
 Specialty Pharmacy Initial Fill Coordination Note  Kendra Allen is a 28 y.o. female contacted today regarding initial fill of specialty medication(s) Secukinumab  (Cosentyx  UnoReady)   Patient requested Delivery   Delivery date: 01/31/24   Verified address: [address]   Medication will be filled on 8/13.   Patient is aware of $0 copayment.

## 2024-01-30 ENCOUNTER — Other Ambulatory Visit (HOSPITAL_COMMUNITY): Payer: Self-pay

## 2024-01-30 ENCOUNTER — Other Ambulatory Visit: Payer: Self-pay

## 2024-01-31 ENCOUNTER — Encounter: Payer: Self-pay | Admitting: Internal Medicine

## 2024-01-31 ENCOUNTER — Other Ambulatory Visit (HOSPITAL_COMMUNITY): Payer: Self-pay

## 2024-01-31 ENCOUNTER — Ambulatory Visit: Admitting: Internal Medicine

## 2024-01-31 ENCOUNTER — Encounter (HOSPITAL_COMMUNITY): Payer: Self-pay

## 2024-01-31 VITALS — BP 120/79 | HR 89 | Temp 98.0°F | Ht 66.0 in | Wt 277.0 lb

## 2024-01-31 DIAGNOSIS — L730 Acne keloid: Secondary | ICD-10-CM | POA: Insufficient documentation

## 2024-01-31 DIAGNOSIS — F419 Anxiety disorder, unspecified: Secondary | ICD-10-CM

## 2024-01-31 DIAGNOSIS — N809 Endometriosis, unspecified: Secondary | ICD-10-CM | POA: Diagnosis not present

## 2024-01-31 DIAGNOSIS — F32A Depression, unspecified: Secondary | ICD-10-CM

## 2024-01-31 DIAGNOSIS — D219 Benign neoplasm of connective and other soft tissue, unspecified: Secondary | ICD-10-CM | POA: Diagnosis not present

## 2024-01-31 DIAGNOSIS — E538 Deficiency of other specified B group vitamins: Secondary | ICD-10-CM

## 2024-01-31 DIAGNOSIS — L810 Postinflammatory hyperpigmentation: Secondary | ICD-10-CM | POA: Insufficient documentation

## 2024-01-31 DIAGNOSIS — L700 Acne vulgaris: Secondary | ICD-10-CM | POA: Insufficient documentation

## 2024-01-31 DIAGNOSIS — E559 Vitamin D deficiency, unspecified: Secondary | ICD-10-CM | POA: Diagnosis not present

## 2024-01-31 MED ORDER — NORETHINDRONE ACETATE 5 MG PO TABS
15.0000 mg | ORAL_TABLET | Freq: Every day | ORAL | 3 refills | Status: AC
  Filled 2024-01-31: qty 270, 90d supply, fill #0
  Filled 2024-04-21: qty 270, 90d supply, fill #1

## 2024-01-31 MED ORDER — ZEPBOUND 2.5 MG/0.5ML ~~LOC~~ SOAJ
2.5000 mg | SUBCUTANEOUS | 3 refills | Status: DC
  Filled 2024-01-31: qty 2, 28d supply, fill #0

## 2024-01-31 MED ORDER — TIRZEPATIDE 2.5 MG/0.5ML ~~LOC~~ SOAJ
2.5000 mg | SUBCUTANEOUS | 3 refills | Status: DC
  Filled 2024-01-31: qty 2, 28d supply, fill #0

## 2024-01-31 MED ORDER — CYANOCOBALAMIN 1000 MCG/ML IJ SOLN
1000.0000 ug | INTRAMUSCULAR | 3 refills | Status: AC
Start: 2024-01-31 — End: ?
  Filled 2024-01-31: qty 3, 90d supply, fill #0

## 2024-01-31 NOTE — Progress Notes (Signed)
 Subjective:  Patient ID: Kendra Allen, female    DOB: December 03, 1995  Age: 28 y.o. MRN: [id number]  CC: Medical Management of Chronic Issues (Discuss and compare best weight loss medication)   HPI Kendra Allen presents for obesity, depression, B12 def  Outpatient Medications Prior to Visit  Medication Sig Dispense Refill   cariprazine  (VRAYLAR ) 3 MG capsule Take 1 capsule (3 mg total) by mouth daily. 30 capsule 2   clindamycin  (CLEOCIN  T) 1 % external solution Apply 1 Application topically 1 (one) - 2 (two) times daily. 30 mL 5   hydrOXYzine  (VISTARIL ) 25 MG capsule Take 1 capsule (25 mg total) by mouth every evening. 30 capsule 2   hydrOXYzine  (VISTARIL ) 25 MG capsule Take 1 capsule (25 mg total) by mouth every evening. 30 capsule 2   modafinil  (PROVIGIL ) 200 MG tablet Take 1 tablet (200 mg total) by mouth every morning. 30 tablet 2   norethindrone  (AYGESTIN ) 5 MG tablet Take 3 tablets (15 mg total) by mouth daily. 270 tablet 1   Secukinumab  (COSENTYX  UNOREADY) 300 MG/2ML SOAJ as directed Subcutaneous one under skin every 4 weeks; Duration: 30 days     Secukinumab  (COSENTYX  UNOREADY) 300 MG/2ML SOAJ Inject 300 mg (2 ml) into the skin every 28 (twenty-eight) days. 2 mL 3   spironolactone  (ALDACTONE ) 100 MG tablet Take 100 mg by mouth once.     VITAMIN D  PO Take by mouth.     vortioxetine  HBr (TRINTELLIX ) 20 MG TABS tablet Take 1 tablet (20 mg total) by mouth daily. 30 tablet 2   vortioxetine  HBr (TRINTELLIX ) 20 MG TABS tablet Take 1 tablet (20 mg total) by mouth daily. 30 tablet 2   No facility-administered medications prior to visit.    ROS: Review of Systems  Constitutional:  Negative for activity change, appetite change, chills, fatigue and unexpected weight change.  HENT:  Negative for congestion, mouth sores and sinus pressure.   Eyes:  Negative for visual disturbance.  Respiratory:  Negative for cough and chest tightness.   Gastrointestinal:  Negative for abdominal pain  and nausea.  Genitourinary:  Negative for difficulty urinating, frequency and vaginal pain.  Musculoskeletal:  Negative for back pain and gait problem.  Skin:  Negative for pallor and rash.  Neurological:  Negative for dizziness, tremors, weakness, numbness and headaches.  Psychiatric/Behavioral:  Negative for confusion, sleep disturbance and suicidal ideas.     Objective:  BP 120/79   Pulse 89   Temp 98 F (36.7 C) (Oral)   Ht 5' 6 (1.676 m)   Wt 277 lb (125.6 kg)   SpO2 98%   BMI 44.71 kg/m   BP Readings from Last 3 Encounters:  01/31/24 120/79  01/07/24 110/70  10/11/23 114/78    Wt Readings from Last 3 Encounters:  01/31/24 277 lb (125.6 kg)  01/07/24 276 lb 12.8 oz (125.6 kg)  10/11/23 270 lb 9.6 oz (122.7 kg)    Physical Exam Constitutional:      General: She is not in acute distress.    Appearance: She is well-developed. She is obese.  HENT:     Head: Normocephalic.     Right Ear: External ear normal.     Left Ear: External ear normal.     Nose: Nose normal.  Eyes:     General:        Right eye: No discharge.        Left eye: No discharge.     Conjunctiva/sclera: Conjunctivae normal.  Pupils: Pupils are equal, round, and reactive to light.  Neck:     Thyroid : No thyromegaly.     Vascular: No JVD.     Trachea: No tracheal deviation.  Cardiovascular:     Rate and Rhythm: Normal rate and regular rhythm.     Heart sounds: Normal heart sounds.  Pulmonary:     Effort: No respiratory distress.     Breath sounds: No stridor. No wheezing.  Abdominal:     General: Bowel sounds are normal. There is no distension.     Palpations: Abdomen is soft. There is no mass.     Tenderness: There is no abdominal tenderness. There is no guarding or rebound.  Musculoskeletal:        General: No tenderness.     Cervical back: Normal range of motion and neck supple. No rigidity.  Lymphadenopathy:     Cervical: No cervical adenopathy.  Skin:    Findings: No erythema  or rash.  Neurological:     Mental Status: She is oriented to person, place, and time.     Cranial Nerves: No cranial nerve deficit.     Motor: No abnormal muscle tone.     Coordination: Coordination normal.     Deep Tendon Reflexes: Reflexes normal.  Psychiatric:        Behavior: Behavior normal.        Thought Content: Thought content normal.        Judgment: Judgment normal.     Lab Results  Component Value Date   WBC 9.3 10/11/2023   HGB 13.1 10/11/2023   HCT 38.7 10/11/2023   PLT 363.0 10/11/2023   GLUCOSE 97 10/11/2023   CHOL 124 10/11/2023   TRIG 69.0 10/11/2023   HDL 30.20 (L) 10/11/2023   LDLCALC 80 10/11/2023   ALT 24 10/11/2023   AST 16 10/11/2023   NA 139 10/11/2023   K 3.7 10/11/2023   CL 106 10/11/2023   CREATININE 0.59 10/11/2023   BUN 8 10/11/2023   CO2 26 10/11/2023   TSH 2.34 10/11/2023   HGBA1C 5.9 10/19/2023    US  LIMITED ULTRASOUND INCLUDING AXILLA RIGHT BREAST Result Date: 01/18/2024 CLINICAL DATA:  Delayed short-term follow-up of probably benign area of fat necrosis seen in the right breast at 10 o'clock on prior July 13, 2023 ultrasound. Patient's last MRI was July 08, 2023 and was negative. EXAM: ULTRASOUND OF THE right BREAST COMPARISON:  MRI dated July 08, 2023 and mammogram and ultrasound dated July 13, 2023 FINDINGS: On physical exam, no palpable masses appreciated in the upper outer right breast. The right nipple appears normal. Skin appears normal without rashes or thickening. Targeted ultrasound is performed in the upper outer right breast and demonstrates an 8 x 6 x 6 mm hyperechoic area just beneath the skin in the right breast 10 o'clock 7 cm from the nipple. This is stable to slightly decreased in size compared to prior ultrasound 7 months ago and is most consistent with benign fat necrosis. IMPRESSION: Stable focal hyperechoic area in the right breast 10 o'clock most consistent with benign fat necrosis. RECOMMENDATION: Patient will  be due for high risk screening MRI in January 2026. I have discussed the findings and recommendations with the patient. If applicable, a reminder letter will be sent to the patient regarding the next appointment. BI-RADS CATEGORY  2: Benign. Electronically Signed   By: Rosina Gelineau M.D.   On: 01/18/2024 14:21    Assessment & Plan:   Problem List Items  Addressed This Visit     Anxiety and depression - Primary   On Trintellix  and Vraylar       B12 deficiency   On vitamin B12 injections - q 30 d      Obesity, morbid, BMI 40.0-49.9 (HCC)   BMI 44 Start Zepbound  inj On diet      Relevant Medications   tirzepatide  (ZEPBOUND ) 2.5 MG/0.5ML Pen   Vitamin D  deficiency   On vit D         Meds ordered this encounter  Medications   DISCONTD: tirzepatide  (MOUNJARO ) 2.5 MG/0.5ML Pen    Sig: Inject 2.5 mg into the skin once a week.    Dispense:  2 mL    Refill:  3   cyanocobalamin  (VITAMIN B12) 1000 MCG/ML injection    Sig: Inject 1 mL (1,000 mcg total) into the muscle every 30 (thirty) days.    Dispense:  3 mL    Refill:  3   DISCONTD: tirzepatide  (MOUNJARO ) 2.5 MG/0.5ML Pen    Sig: Inject 2.5 mg into the skin once a week.    Dispense:  2 mL    Refill:  3   tirzepatide  (ZEPBOUND ) 2.5 MG/0.5ML Pen    Sig: Inject 2.5 mg into the skin once a week.    Dispense:  2 mL    Refill:  3    BMI 44: E66.01      Follow-up: Return in about 3 months (around 05/02/2024) for a follow-up visit.  Marolyn Noel, MD

## 2024-01-31 NOTE — Assessment & Plan Note (Signed)
 On vitamin B12 injections - q 30 d

## 2024-01-31 NOTE — Assessment & Plan Note (Signed)
On Trintellix and Vraylar

## 2024-01-31 NOTE — Assessment & Plan Note (Signed)
 On vit D

## 2024-01-31 NOTE — Assessment & Plan Note (Signed)
 BMI 44 Start Zepbound  inj On diet

## 2024-02-07 ENCOUNTER — Ambulatory Visit (INDEPENDENT_AMBULATORY_CARE_PROVIDER_SITE_OTHER): Admitting: Psychology

## 2024-02-07 DIAGNOSIS — F331 Major depressive disorder, recurrent, moderate: Secondary | ICD-10-CM | POA: Diagnosis not present

## 2024-02-07 NOTE — Progress Notes (Signed)
 [facility] Counselor/Therapist Progress Note  Patient ID: Kendra Allen, MRN: [id number],    Date: 02/07/2024  Time Spent: 5:00pm - 5:45pm   45 minutes   Treatment Type: Individual Therapy  Reported Symptoms: stress  Mental Status Exam: Appearance:  Casual     Behavior: Appropriate  Motor: Normal  Speech/Language:  Normal Rate  Affect: Appropriate  Mood: normal  Thought process: normal  Thought content:   WNL  Sensory/Perceptual disturbances:   WNL  Orientation: oriented to person, place, time/date, and situation  Attention: Good  Concentration: Good  Memory: WNL  Fund of knowledge:  Good  Insight:   Good  Judgment:  Good  Impulse Control: Good   Risk Assessment: Danger to Self:  No Self-injurious Behavior: No Danger to Others: No Duty to Warn:no Physical Aggression / Violence:No  Access to Firearms a concern: No  Gang Involvement:No   Subjective: Pt present for face-to-face individual therapy via video.  Pt consents to telehealth video session and is aware of limitations and benefits of virtual sessions.  Location of pt: home Location of therapist: home office.   Pt talked about starting to take a public speaking class this semester.    Pt states the past couple of weeks have been stressful bc her cousin stayed with pt and parents for a couple of weeks.  Her cousin was not thoughtful during the visit which was frustrating for pt.   Pt talked about her health.   Pt continues to focus on healthy nutrition and is working on lowering her A1C.  Pt is working on mindful eating.  She is incorporating exercise into her schedule.   Pt talked about work.  It has been very busy and stressful at times.   Worked on Pharmacologist.   Pt is applying for full time jobs in the [employer] system.  Pt states that overall her mood has been stable.   Pt is keeping busy with social activities with friends.   Worked on self care strategies. Provided supportive  therapy.  Interventions: Cognitive Behavioral Therapy and Insight-Oriented  Diagnosis: F33.1  Plan of Care: Recommend ongoing therapy.  Pt participated in setting treatment goals.   She wants to improve coping skills.  Plan to meet every two weeks.  Pt agrees with treatment plan.   Treatment Plan  (treatment plan target date: 07/23/2024). Client Abilities/Strengths  Pt is bright and motivated for therapy. Client Treatment Preferences  Individual therapy.  Client Statement of Needs  Improve coping skills.  Symptoms  Depressed or irritable mood.  Diminished interest in or enjoyment of activities.  Problems Addressed  Unipolar Depression Goals 1. Alleviate depressive symptoms and return to previous level of effective functioning. 2. Appropriately grieve the loss in order to normalize mood and to return to previously adaptive level of functioning. Objective Learn and implement behavioral strategies to overcome depression. Target Date: 2024-07-23 Frequency: Biweekly  Progress: 50 Modality: individual  Related Interventions Engage the client in behavioral activation, increasing his/her activity level and contact with sources of reward, while identifying processes that inhibit activation.  Use behavioral techniques such as instruction, rehearsal, role-playing, role reversal, as needed, to facilitate activity in the client's daily life; reinforce success. Assist the client in developing skills that increase the likelihood of deriving pleasure from behavioral activation (e.g., assertiveness skills, developing an exercise plan, less internal/more external focus, increased social involvement); reinforce success. Objective Identify important people in life, past and present, and describe the quality, good and  poor, of those relationships. Target Date: 2024-07-23 Frequency: Biweekly  Progress: 50 Modality: individual  Related Interventions Conduct Interpersonal Therapy beginning with the  assessment of the client's interpersonal inventory of important past and present relationships; develop a case formulation linking depression to grief, interpersonal role disputes, role transitions, and/or interpersonal deficits). Objective Learn and implement problem-solving and decision-making skills. Target Date: 2024-07-23 Frequency: Biweekly  Progress: 50 Modality: individual  Related Interventions Conduct Problem-Solving Therapy using techniques such as psychoeducation, modeling, and role-playing to teach client problem-solving skills (i.e., defining a problem specifically, generating possible solutions, evaluating the pros and cons of each solution, selecting and implementing a plan of action, evaluating the efficacy of the plan, accepting or revising the plan); role-play application of the problem-solving skill to a real life issue. Encourage in the client the development of a positive problem orientation in which problems and solving them are viewed as a natural part of life and not something to be feared, despaired, or avoided. 3. Develop healthy interpersonal relationships that lead to the alleviation and help prevent the relapse of depression. 4. Develop healthy thinking patterns and beliefs about self, others, and the world that lead to the alleviation and help prevent the relapse of depression. 5. Recognize, accept, and cope with feelings of depression. Diagnosis F33.1  Conditions For Discharge Achievement of treatment goals and objectives   Veva Alma, LCSW

## 2024-02-07 NOTE — Telephone Encounter (Signed)
 Copied from CRM [id number]. Topic: Clinical - Medication Question >> Feb 07, 2024 10:34 AM Revonda D wrote: Reason for CRM: Pt's mother Kyra stated that the wight loss medication is not being covered by the insurance and stated they will just pay out of pocket. Kyra would like to know which medication DR.Plotnikov recommended and would like for the prescription to be ordered and sent to the pharmacy so they can pick it up.

## 2024-02-08 ENCOUNTER — Other Ambulatory Visit (HOSPITAL_COMMUNITY): Payer: Self-pay

## 2024-02-08 ENCOUNTER — Other Ambulatory Visit: Payer: Self-pay

## 2024-02-08 MED ORDER — ZEPBOUND 2.5 MG/0.5ML ~~LOC~~ SOAJ
2.5000 mg | SUBCUTANEOUS | 3 refills | Status: DC
  Filled 2024-02-08: qty 2, 28d supply, fill #0

## 2024-02-11 ENCOUNTER — Other Ambulatory Visit (HOSPITAL_COMMUNITY): Payer: Self-pay

## 2024-02-11 NOTE — Telephone Encounter (Signed)
 Copied from CRM #[id number]. Topic: Clinical - Medication Question >> Feb 08, 2024  2:36 PM Jasmin G wrote: Reason for CRM: Pt called regarding the status of her Zepbound  not being covered by [employer], please refer to recent Encounters and call her back at [phone] to give her a status. >> Feb 08, 2024  2:38 PM CMA Lila C wrote: Wrong office

## 2024-02-20 ENCOUNTER — Encounter (INDEPENDENT_AMBULATORY_CARE_PROVIDER_SITE_OTHER): Payer: Self-pay

## 2024-02-20 ENCOUNTER — Other Ambulatory Visit (HOSPITAL_COMMUNITY): Payer: Self-pay

## 2024-02-21 ENCOUNTER — Other Ambulatory Visit: Payer: Self-pay

## 2024-02-21 ENCOUNTER — Other Ambulatory Visit: Payer: Self-pay | Admitting: Pharmacy Technician

## 2024-02-21 NOTE — Progress Notes (Signed)
 Specialty Pharmacy Refill Coordination Note  Kendra Allen is a 28 y.o. female contacted today regarding refills of specialty medication(s) Secukinumab  (Cosentyx  UnoReady)   Patient requested (Patient-Rptd) Delivery   Delivery date: 03/04/24 Verified address: (Patient-Rptd) [address] [name] [address]   Medication will be filled on 03/03/24.

## 2024-02-28 ENCOUNTER — Other Ambulatory Visit: Payer: Self-pay

## 2024-03-03 ENCOUNTER — Ambulatory Visit: Admitting: Psychology

## 2024-03-03 ENCOUNTER — Other Ambulatory Visit: Payer: Self-pay

## 2024-03-03 DIAGNOSIS — F331 Major depressive disorder, recurrent, moderate: Secondary | ICD-10-CM | POA: Diagnosis not present

## 2024-03-03 NOTE — Progress Notes (Signed)
 [facility] Counselor/Therapist Progress Note  Patient ID: Kendra Allen, MRN: [id number],    Date: 03/03/2024  Time Spent: 5:00pm - 5:45pm   45 minutes   Treatment Type: Individual Therapy  Reported Symptoms: stress  Mental Status Exam: Appearance:  Casual     Behavior: Appropriate  Motor: Normal  Speech/Language:  Normal Rate  Affect: Appropriate  Mood: normal  Thought process: normal  Thought content:   WNL  Sensory/Perceptual disturbances:   WNL  Orientation: oriented to person, place, time/date, and situation  Attention: Good  Concentration: Good  Memory: WNL  Fund of knowledge:  Good  Insight:   Good  Judgment:  Good  Impulse Control: Good   Risk Assessment: Danger to Self:  No Self-injurious Behavior: No Danger to Others: No Duty to Warn:no Physical Aggression / Violence:No  Access to Firearms a concern: No  Gang Involvement:No   Subjective: Pt present for face-to-face individual therapy via video.  Pt consents to telehealth video session and is aware of limitations and benefits of virtual sessions.  Location of pt: home Location of therapist: home office.   Pt talked about school.  She is taking a public speaking class and gets nervous at times about speaking in front of others.   Pt talked about her health.   Pt continues to focus on healthy nutrition and is working on lowering her A1C.  Pt is working on mindful eating.  She is incorporating exercise into her schedule.  Pt has started taking Zepbound  which is helping to decrease her appetite.  Pt talked about work.  It has been very busy and stressful at times.   Worked on Pharmacologist.   Pt is applying for full time jobs in the [employer] system.  Pt states that overall her mood has been stable.   Pt is keeping busy with social activities with friends.   Pt will be going on vacation to [place] in October.  She is looking forward to the trip and being out of work for a week.  Worked on  self care strategies. Provided supportive therapy.  Interventions: Cognitive Behavioral Therapy and Insight-Oriented  Diagnosis: F33.1  Plan of Care: Recommend ongoing therapy.  Pt participated in setting treatment goals.   She wants to improve coping skills.  Plan to meet every two weeks.  Pt agrees with treatment plan.   Treatment Plan  (treatment plan target date: 07/23/2024). Client Abilities/Strengths  Pt is bright and motivated for therapy. Client Treatment Preferences  Individual therapy.  Client Statement of Needs  Improve coping skills.  Symptoms  Depressed or irritable mood.  Diminished interest in or enjoyment of activities.  Problems Addressed  Unipolar Depression Goals 1. Alleviate depressive symptoms and return to previous level of effective functioning. 2. Appropriately grieve the loss in order to normalize mood and to return to previously adaptive level of functioning. Objective Learn and implement behavioral strategies to overcome depression. Target Date: 2024-07-23 Frequency: Biweekly  Progress: 50 Modality: individual  Related Interventions Engage the client in behavioral activation, increasing his/her activity level and contact with sources of reward, while identifying processes that inhibit activation.  Use behavioral techniques such as instruction, rehearsal, role-playing, role reversal, as needed, to facilitate activity in the client's daily life; reinforce success. Assist the client in developing skills that increase the likelihood of deriving pleasure from behavioral activation (e.g., assertiveness skills, developing an exercise plan, less internal/more external focus, increased social involvement); reinforce success. Objective Identify important people in  life, past and present, and describe the quality, good and poor, of those relationships. Target Date: 2024-07-23 Frequency: Biweekly  Progress: 50 Modality: individual  Related Interventions Conduct  Interpersonal Therapy beginning with the assessment of the client's interpersonal inventory of important past and present relationships; develop a case formulation linking depression to grief, interpersonal role disputes, role transitions, and/or interpersonal deficits). Objective Learn and implement problem-solving and decision-making skills. Target Date: 2024-07-23 Frequency: Biweekly  Progress: 50 Modality: individual  Related Interventions Conduct Problem-Solving Therapy using techniques such as psychoeducation, modeling, and role-playing to teach client problem-solving skills (i.e., defining a problem specifically, generating possible solutions, evaluating the pros and cons of each solution, selecting and implementing a plan of action, evaluating the efficacy of the plan, accepting or revising the plan); role-play application of the problem-solving skill to a real life issue. Encourage in the client the development of a positive problem orientation in which problems and solving them are viewed as a natural part of life and not something to be feared, despaired, or avoided. 3. Develop healthy interpersonal relationships that lead to the alleviation and help prevent the relapse of depression. 4. Develop healthy thinking patterns and beliefs about self, others, and the world that lead to the alleviation and help prevent the relapse of depression. 5. Recognize, accept, and cope with feelings of depression. Diagnosis F33.1  Conditions For Discharge Achievement of treatment goals and objectives   Veva Alma, LCSW

## 2024-03-05 ENCOUNTER — Encounter: Payer: Self-pay | Admitting: Internal Medicine

## 2024-03-11 ENCOUNTER — Other Ambulatory Visit: Payer: Self-pay

## 2024-03-11 ENCOUNTER — Other Ambulatory Visit (HOSPITAL_COMMUNITY): Payer: Self-pay

## 2024-03-11 ENCOUNTER — Other Ambulatory Visit: Payer: Self-pay | Admitting: Internal Medicine

## 2024-03-11 MED ORDER — TIRZEPATIDE-WEIGHT MANAGEMENT 5 MG/0.5ML ~~LOC~~ SOAJ
5.0000 mg | SUBCUTANEOUS | 5 refills | Status: DC
  Filled 2024-03-11: qty 2, 28d supply, fill #0
  Filled 2024-04-08: qty 2, 28d supply, fill #1
  Filled 2024-05-04: qty 2, 28d supply, fill #2

## 2024-03-21 ENCOUNTER — Other Ambulatory Visit (HOSPITAL_COMMUNITY): Payer: Self-pay

## 2024-03-22 DIAGNOSIS — H52223 Regular astigmatism, bilateral: Secondary | ICD-10-CM | POA: Diagnosis not present

## 2024-03-24 ENCOUNTER — Other Ambulatory Visit: Payer: Self-pay

## 2024-03-24 ENCOUNTER — Other Ambulatory Visit (HOSPITAL_COMMUNITY): Payer: Self-pay

## 2024-03-24 ENCOUNTER — Encounter (INDEPENDENT_AMBULATORY_CARE_PROVIDER_SITE_OTHER): Payer: Self-pay

## 2024-03-24 NOTE — Progress Notes (Signed)
 Specialty Pharmacy Refill Coordination Note  MyChart Questionnaire Submission  Kendra Allen is a 28 y.o. female contacted today regarding refills of specialty medication(s) Cosentyx   Doses on hand: (Patient-Rptd) 0   Injection date: (Patient-Rptd) 04/03/24  Patient requested: (Patient-Rptd) Delivery   Delivery date: 03/26/24  Verified address: [address] [place] [address]  Medication will be filled on 03/25/24.

## 2024-03-25 ENCOUNTER — Other Ambulatory Visit: Payer: Self-pay

## 2024-03-25 ENCOUNTER — Other Ambulatory Visit (HOSPITAL_COMMUNITY): Payer: Self-pay

## 2024-04-08 ENCOUNTER — Ambulatory Visit: Admitting: Psychology

## 2024-04-08 DIAGNOSIS — F331 Major depressive disorder, recurrent, moderate: Secondary | ICD-10-CM | POA: Diagnosis not present

## 2024-04-08 NOTE — Progress Notes (Unsigned)
 [facility] Counselor/Therapist Progress Note  Patient ID: Kendra Allen, MRN: [id number],    Date: 04/08/2024  Time Spent: 5:00pm - 5:45pm   45 minutes   Treatment Type: Individual Therapy  Reported Symptoms: stress  Mental Status Exam: Appearance:  Casual     Behavior: Appropriate  Motor: Normal  Speech/Language:  Normal Rate  Affect: Appropriate  Mood: normal  Thought process: normal  Thought content:   WNL  Sensory/Perceptual disturbances:   WNL  Orientation: oriented to person, place, time/date, and situation  Attention: Good  Concentration: Good  Memory: WNL  Fund of knowledge:  Good  Insight:   Good  Judgment:  Good  Impulse Control: Good   Risk Assessment: Danger to Self:  No Self-injurious Behavior: No Danger to Others: No Duty to Warn:no Physical Aggression / Violence:No  Access to Firearms a concern: No  Gang Involvement:No   Subjective: Pt present for face-to-face individual therapy via video.  Pt consents to telehealth video session and is aware of limitations and benefits of virtual sessions.  Location of pt: home Location of therapist: home office.   Pt talked about work.  She states she does not like her job bc of office dynamics.   Addressed the dynamics and how they impact pt.  Helped pt process her feelings.  Pt is also frustrated with her pay.  Pt is feeling burned out at work.  Worked on [employer].  Pt talked about going on vacation to [place] last week.  She had a very good vacation but it is hard being back at work now.  Pt talked about feeling badly when she compares herself to others who are getting married and having kids, etc.  Pt feels like she is behind in life regarding career and personally.  Addressed how comparison does not serve her well and worked on acknowledging the progress pt has made in her life. Pt states overall her mood has been stable.  Pt is working on self growth and working on being more  resilient.   Pt is still taking Zepbound  which is helping to reduce her appetite.   Worked on self care strategies. Provided supportive therapy.  Interventions: Cognitive Behavioral Therapy and Insight-Oriented  Diagnosis: F33.1  Plan of Care: Recommend ongoing therapy.  Pt participated in setting treatment goals.   She wants to improve coping skills.  Plan to meet every two weeks.  Pt agrees with treatment plan.   Treatment Plan  (treatment plan target date: 07/23/2024). Client Abilities/Strengths  Pt is bright and motivated for therapy. Client Treatment Preferences  Individual therapy.  Client Statement of Needs  Improve coping skills.  Symptoms  Depressed or irritable mood.  Diminished interest in or enjoyment of activities.  Problems Addressed  Unipolar Depression Goals 1. Alleviate depressive symptoms and return to previous level of effective functioning. 2. Appropriately grieve the loss in order to normalize mood and to return to previously adaptive level of functioning. Objective Learn and implement behavioral strategies to overcome depression. Target Date: 2024-07-23 Frequency: Biweekly  Progress: 50 Modality: individual  Related Interventions Engage the client in behavioral activation, increasing his/her activity level and contact with sources of reward, while identifying processes that inhibit activation.  Use behavioral techniques such as instruction, rehearsal, role-playing, role reversal, as needed, to facilitate activity in the client's daily life; reinforce success. Assist the client in developing skills that increase the likelihood of deriving pleasure from behavioral activation (e.g., assertiveness skills, developing an exercise plan, less  internal/more external focus, increased social involvement); reinforce success. Objective Identify important people in life, past and present, and describe the quality, good and poor, of those relationships. Target Date:  2024-07-23 Frequency: Biweekly  Progress: 50 Modality: individual  Related Interventions Conduct Interpersonal Therapy beginning with the assessment of the client's interpersonal inventory of important past and present relationships; develop a case formulation linking depression to grief, interpersonal role disputes, role transitions, and/or interpersonal deficits). Objective Learn and implement problem-solving and decision-making skills. Target Date: 2024-07-23 Frequency: Biweekly  Progress: 50 Modality: individual  Related Interventions Conduct Problem-Solving Therapy using techniques such as psychoeducation, modeling, and role-playing to teach client problem-solving skills (i.e., defining a problem specifically, generating possible solutions, evaluating the pros and cons of each solution, selecting and implementing a plan of action, evaluating the efficacy of the plan, accepting or revising the plan); role-play application of the problem-solving skill to a real life issue. Encourage in the client the development of a positive problem orientation in which problems and solving them are viewed as a natural part of life and not something to be feared, despaired, or avoided. 3. Develop healthy interpersonal relationships that lead to the alleviation and help prevent the relapse of depression. 4. Develop healthy thinking patterns and beliefs about self, others, and the world that lead to the alleviation and help prevent the relapse of depression. 5. Recognize, accept, and cope with feelings of depression. Diagnosis F33.1  Conditions For Discharge Achievement of treatment goals and objectives   Kendra Alma, LCSW

## 2024-04-09 ENCOUNTER — Other Ambulatory Visit (HOSPITAL_COMMUNITY): Payer: Self-pay

## 2024-04-14 ENCOUNTER — Other Ambulatory Visit: Payer: Self-pay

## 2024-04-16 ENCOUNTER — Other Ambulatory Visit: Payer: Self-pay

## 2024-04-16 NOTE — Progress Notes (Signed)
 Specialty Pharmacy Refill Coordination Note  Kendra Allen is a 28 y.o. female contacted today regarding refills of specialty medication(s) Secukinumab  (Cosentyx  UnoReady)   Patient requested Delivery   Delivery date: 04/30/24   Verified address: [address] [name] [address]   Medication will be filled on: 04/29/24  Sent patient mychart message with updated delivery date.

## 2024-04-18 ENCOUNTER — Other Ambulatory Visit: Payer: Self-pay

## 2024-04-18 ENCOUNTER — Other Ambulatory Visit (HOSPITAL_COMMUNITY): Payer: Self-pay

## 2024-04-18 DIAGNOSIS — F419 Anxiety disorder, unspecified: Secondary | ICD-10-CM | POA: Diagnosis not present

## 2024-04-18 DIAGNOSIS — F331 Major depressive disorder, recurrent, moderate: Secondary | ICD-10-CM | POA: Diagnosis not present

## 2024-04-18 MED ORDER — TRINTELLIX 20 MG PO TABS
20.0000 mg | ORAL_TABLET | Freq: Every day | ORAL | 2 refills | Status: AC
  Filled 2024-07-10: qty 30, 30d supply, fill #0

## 2024-04-18 MED ORDER — MODAFINIL 200 MG PO TABS
200.0000 mg | ORAL_TABLET | Freq: Every morning | ORAL | 2 refills | Status: AC
  Filled 2024-05-22: qty 30, 30d supply, fill #0
  Filled 2024-06-22: qty 30, 30d supply, fill #1

## 2024-04-18 MED ORDER — HYDROXYZINE PAMOATE 25 MG PO CAPS
25.0000 mg | ORAL_CAPSULE | Freq: Every evening | ORAL | 2 refills | Status: AC
  Filled 2024-06-06: qty 30, 30d supply, fill #0

## 2024-04-18 MED ORDER — VRAYLAR 3 MG PO CAPS
3.0000 mg | ORAL_CAPSULE | Freq: Every day | ORAL | 2 refills | Status: AC
  Filled 2024-04-18: qty 30, 30d supply, fill #0
  Filled 2024-06-22: qty 30, 30d supply, fill #1

## 2024-04-21 ENCOUNTER — Other Ambulatory Visit (HOSPITAL_COMMUNITY): Payer: Self-pay

## 2024-04-21 ENCOUNTER — Other Ambulatory Visit: Payer: Self-pay

## 2024-04-22 ENCOUNTER — Other Ambulatory Visit (HOSPITAL_COMMUNITY): Payer: Self-pay

## 2024-04-28 ENCOUNTER — Ambulatory Visit: Admitting: Psychology

## 2024-04-28 DIAGNOSIS — F331 Major depressive disorder, recurrent, moderate: Secondary | ICD-10-CM

## 2024-04-28 NOTE — Progress Notes (Signed)
 [facility] Counselor/Therapist Progress Note  Patient ID: Kendra Allen, MRN: [id number],    Date: 04/28/2024  Time Spent: 5:00pm - 5:45pm   45 minutes   Treatment Type: Individual Therapy  Reported Symptoms: stress  Mental Status Exam: Appearance:  Casual     Behavior: Appropriate  Motor: Normal  Speech/Language:  Normal Rate  Affect: Appropriate  Mood: normal  Thought process: normal  Thought content:   WNL  Sensory/Perceptual disturbances:   WNL  Orientation: oriented to person, place, time/date, and situation  Attention: Good  Concentration: Good  Memory: WNL  Fund of knowledge:  Good  Insight:   Good  Judgment:  Good  Impulse Control: Good   Risk Assessment: Danger to Self:  No Self-injurious Behavior: No Danger to Others: No Duty to Warn:no Physical Aggression / Violence:No  Access to Firearms a concern: No  Gang Involvement:No   Subjective: Pt present for face-to-face individual therapy via video.  Pt consents to telehealth video session and is aware of limitations and benefits of virtual sessions.  Location of pt: home Location of therapist: home office.   Pt talked about work.  It was a very busy Monday.   Pt goes to work consistently but does not like the job.  Addressed how pt can make the best of her work days.  Pt states she is working on not being frustrated about not getting into the academic program she wanted to get in.  She does not like that there is a delay in her goals.   Pt is also taking classes that are time consuming.   Pt has been being sure she stays connected with friends which she is proud of herself about.  She does not like winter time bc of the cold and shorter days, but she is trying to not let it affect her mood.   Pt states overall her mood has been stable.  Pt is working on self growth and working on being more resilient.   Pt is still taking Zepbound  which is helping to reduce her appetite.  Pt wants to start  exercising more.  Helped pt problem solve how to fit exercise into her schedule.   Worked on self care strategies. Provided supportive therapy.  Interventions: Cognitive Behavioral Therapy and Insight-Oriented  Diagnosis: F33.1  Plan of Care: Recommend ongoing therapy.  Pt participated in setting treatment goals.   She wants to improve coping skills.  Plan to meet every two weeks.  Pt agrees with treatment plan.   Treatment Plan  (treatment plan target date: 07/23/2024). Client Abilities/Strengths  Pt is bright and motivated for therapy. Client Treatment Preferences  Individual therapy.  Client Statement of Needs  Improve coping skills.  Symptoms  Depressed or irritable mood.  Diminished interest in or enjoyment of activities.  Problems Addressed  Unipolar Depression Goals 1. Alleviate depressive symptoms and return to previous level of effective functioning. 2. Appropriately grieve the loss in order to normalize mood and to return to previously adaptive level of functioning. Objective Learn and implement behavioral strategies to overcome depression. Target Date: 2024-07-23 Frequency: Biweekly  Progress: 50 Modality: individual  Related Interventions Engage the client in behavioral activation, increasing his/her activity level and contact with sources of reward, while identifying processes that inhibit activation.  Use behavioral techniques such as instruction, rehearsal, role-playing, role reversal, as needed, to facilitate activity in the client's daily life; reinforce success. Assist the client in developing skills that increase the likelihood of deriving  pleasure from behavioral activation (e.g., assertiveness skills, developing an exercise plan, less internal/more external focus, increased social involvement); reinforce success. Objective Identify important people in life, past and present, and describe the quality, good and poor, of those relationships. Target Date:  2024-07-23 Frequency: Biweekly  Progress: 50 Modality: individual  Related Interventions Conduct Interpersonal Therapy beginning with the assessment of the client's interpersonal inventory of important past and present relationships; develop a case formulation linking depression to grief, interpersonal role disputes, role transitions, and/or interpersonal deficits). Objective Learn and implement problem-solving and decision-making skills. Target Date: 2024-07-23 Frequency: Biweekly  Progress: 50 Modality: individual  Related Interventions Conduct Problem-Solving Therapy using techniques such as psychoeducation, modeling, and role-playing to teach client problem-solving skills (i.e., defining a problem specifically, generating possible solutions, evaluating the pros and cons of each solution, selecting and implementing a plan of action, evaluating the efficacy of the plan, accepting or revising the plan); role-play application of the problem-solving skill to a real life issue. Encourage in the client the development of a positive problem orientation in which problems and solving them are viewed as a natural part of life and not something to be feared, despaired, or avoided. 3. Develop healthy interpersonal relationships that lead to the alleviation and help prevent the relapse of depression. 4. Develop healthy thinking patterns and beliefs about self, others, and the world that lead to the alleviation and help prevent the relapse of depression. 5. Recognize, accept, and cope with feelings of depression. Diagnosis F33.1  Conditions For Discharge Achievement of treatment goals and objectives   Veva Alma, LCSW

## 2024-04-29 ENCOUNTER — Other Ambulatory Visit: Payer: Self-pay

## 2024-04-29 ENCOUNTER — Other Ambulatory Visit (HOSPITAL_COMMUNITY): Payer: Self-pay

## 2024-05-05 ENCOUNTER — Other Ambulatory Visit (HOSPITAL_COMMUNITY): Payer: Self-pay

## 2024-05-06 ENCOUNTER — Other Ambulatory Visit (HOSPITAL_COMMUNITY): Payer: Self-pay

## 2024-05-06 ENCOUNTER — Ambulatory Visit
Admission: RE | Admit: 2024-05-06 | Discharge: 2024-05-06 | Disposition: A | Discharge status: Home / Self Care | Source: Ambulatory Visit | Attending: Urgent Care | Admitting: Urgent Care

## 2024-05-06 VITALS — BP 116/76 | HR 87 | Temp 99.2°F | Resp 16

## 2024-05-06 DIAGNOSIS — J309 Allergic rhinitis, unspecified: Secondary | ICD-10-CM

## 2024-05-06 DIAGNOSIS — H6993 Unspecified Eustachian tube disorder, bilateral: Secondary | ICD-10-CM

## 2024-05-06 MED ORDER — CETIRIZINE HCL 10 MG PO TABS
10.0000 mg | ORAL_TABLET | Freq: Every day | ORAL | 0 refills | Status: DC
  Filled 2024-05-06: qty 30, 30d supply, fill #0

## 2024-05-06 MED ORDER — PREDNISONE 20 MG PO TABS
40.0000 mg | ORAL_TABLET | Freq: Every day | ORAL | 0 refills | Status: AC
  Filled 2024-05-06: qty 10, 5d supply, fill #0

## 2024-05-06 NOTE — Discharge Instructions (Signed)
 If you continue to have right ear pain come Friday-Sun, just call this clinic for me to send in a script for an antibiotic to address secondary middle ear infection.

## 2024-05-06 NOTE — ED Provider Notes (Signed)
 [facility]  Note:  This document was prepared using Conservation officer, historic buildings and may include unintentional dictation errors.  MRN: [id number] DOB: 1995/10/17  Subjective:   Kendra Allen is a 28 y.o. female presenting for 2-day history of recurrent right ear pain.  Patient is concerned that she has a recurrent right ear infection.  Reports that she had a similar episode same time this past year.  Has not tried any medications for relief.  She does have some drainage.  No overt runny and stuffy nose.  Has a history of allergic rhinitis but does not take anything consistently for this.  No fever, dizziness, tinnitus, ear drainage, ear trauma.  No current facility-administered medications for this encounter.  Current Outpatient Medications:    cariprazine  (VRAYLAR ) 3 MG capsule, Take 1 capsule (3 mg total) by mouth daily., Disp: 30 capsule, Rfl: 2   cariprazine  (VRAYLAR ) 3 MG capsule, Take 1 capsule (3 mg total) by mouth daily., Disp: 30 capsule, Rfl: 2   clindamycin  (CLEOCIN  T) 1 % external solution, Apply 1 Application topically 1 (one) - 2 (two) times daily., Disp: 30 mL, Rfl: 5   cyanocobalamin  (VITAMIN B12) 1000 MCG/ML injection, Inject 1 mL (1,000 mcg total) into the muscle every 30 (thirty) days., Disp: 3 mL, Rfl: 3   hydrOXYzine  (VISTARIL ) 25 MG capsule, Take 1 capsule (25 mg total) by mouth every evening., Disp: 30 capsule, Rfl: 2   hydrOXYzine  (VISTARIL ) 25 MG capsule, Take 1 capsule (25 mg total) by mouth every evening., Disp: 30 capsule, Rfl: 2   hydrOXYzine  (VISTARIL ) 25 MG capsule, Take 1 capsule (25 mg total) by mouth every evening., Disp: 30 capsule, Rfl: 2   modafinil  (PROVIGIL ) 200 MG tablet, Take 1 tablet (200 mg total) by mouth in the morning., Disp: 30 tablet, Rfl: 2   norethindrone  (AYGESTIN ) 5 MG tablet, Take 3 tablets (15 mg total) by mouth daily., Disp: 270 tablet, Rfl: 1   norethindrone  (AYGESTIN ) 5 MG tablet, Take 3 tablets (15 mg  total) by mouth daily., Disp: 270 tablet, Rfl: 3   Secukinumab  (COSENTYX  UNOREADY) 300 MG/2ML SOAJ, Inject 300 mg (2 ml) into the skin every 28 (twenty-eight) days., Disp: 2 mL, Rfl: 3   spironolactone  (ALDACTONE ) 100 MG tablet, Take 100 mg by mouth once., Disp: , Rfl:    tirzepatide  (ZEPBOUND ) 5 MG/0.5ML Pen, Inject 5 mg into the skin once a week., Disp: 2 mL, Rfl: 5   VITAMIN D  PO, Take by mouth., Disp: , Rfl:    vortioxetine  HBr (TRINTELLIX ) 20 MG TABS tablet, Take 1 tablet (20 mg total) by mouth daily., Disp: 30 tablet, Rfl: 2   vortioxetine  HBr (TRINTELLIX ) 20 MG TABS tablet, Take 1 tablet (20 mg total) by mouth daily., Disp: 30 tablet, Rfl: 2   Allergies  Allergen Reactions   Doxycycline      Upset stomach   Sulfamethoxazole-Trimethoprim     upset stomach    Past Medical History:  Diagnosis Date   Anemia    Anxiety    Chest pain    Constipation    Cyst of right ovary 12/14/2020   Depression    Endometriosis of pelvis    Family history of breast cancer    Family history of colon cancer    Family history of ovarian cancer    Family history of prostate cancer    Hidradenitis suppurativa    PCOS (polycystic ovarian syndrome)    Prediabetes    Sleep apnea  Vitamin D  deficiency      Past Surgical History:  Procedure Laterality Date   CERVICAL POLYPECTOMY     DILATATION & CURETTAGE/HYSTEROSCOPY WITH MYOSURE N/A 11/21/2016   Procedure: DILATATION & CURETTAGE/HYSTEROSCOPY WITH MYOSURE;  Surgeon: Rutherford Gain, MD;  Location: [facility];  Service: Gynecology;  [name];   ROBOTIC ASSISTED LAPAROSCOPIC OVARIAN CYSTECTOMY N/A 12/30/2020   Procedure: XI ROBOTIC ASSISTED LAPAROSCOPIC RIGHT SALPINGO-OOPHORECTOMY WITH PELVIC WASHINGS;  Surgeon: Viktoria Comer SAUNDERS, MD;  [name];  Service: Gynecology;  [name];   WISDOM TOOTH EXTRACTION      Family History  Problem Relation Age of Onset   Hypertension Mother    Depression Mother    Obesity Mother     Sleep apnea Father    Obesity Father    Colon cancer Maternal Uncle 35   Diabetes Paternal Aunt    Heart attack Maternal Grandmother    Prostate cancer Maternal Grandfather        dx 20s, metastatic   Stroke Paternal Grandfather    Breast cancer Half-Sister 42   Ovarian cancer Maternal Great-grandmother        dx ?, MGF's mother   Breast cancer Other        multiple of mother's paternal cousins   Ovarian cancer Other        multiple of mother's paternal cousins   Prostate cancer Other        multiple of mother's paternal cousins   Endometrial cancer Neg Hx    Pancreatic cancer Neg Hx     Social History   Tobacco Use   Smoking status: Never   Smokeless tobacco: Never  Vaping Use   Vaping status: Never Used  Substance Use Topics   Alcohol use: Yes    Comment: social   Drug use: Never    ROS   Objective:   Vitals: BP 116/76 (BP Location: Left Arm)   Pulse 87   Temp 99.2 F (37.3 C) (Oral)   Resp 16   SpO2 95%   Physical Exam Constitutional:      General: She is not in acute distress.    Appearance: Normal appearance. She is well-developed. She is not ill-appearing, toxic-appearing or diaphoretic.  HENT:     Head: Normocephalic and atraumatic.     Right Ear: Ear canal and external ear normal. No tenderness. There is no impacted cerumen. Tympanic membrane is not injected, perforated, erythematous or bulging.     Left Ear: Ear canal and external ear normal. No tenderness. There is no impacted cerumen. Tympanic membrane is not injected, perforated, erythematous or bulging.     Ears:     Comments: Trace bilateral ear effusion but without erythema, drainage, perforations.    Nose: Nose normal.     Mouth/Throat:     Mouth: Mucous membranes are moist.  Eyes:     General: No scleral icterus.       Right eye: No discharge.        Left eye: No discharge.     Extraocular Movements: Extraocular movements intact.  Cardiovascular:     Rate and Rhythm: Normal rate.   Pulmonary:     Effort: Pulmonary effort is normal.  Skin:    General: Skin is warm and dry.  Neurological:     General: No focal deficit present.     Mental Status: She is alert and oriented to person, place, and time.  Psychiatric:        Mood and Affect: Mood  normal.        Behavior: Behavior normal.     Assessment and Plan :   PDMP not reviewed this encounter.  1. Eustachian tube dysfunction, bilateral   2. Allergic rhinitis, unspecified seasonality, unspecified trigger    Recommended managing for eustachian tube dysfunction with daily Zyrtec , 5-day course of prednisone .  Should patient remain symptomatic or worsen when I return this weekend, I am not opposed to trying an oral antibiotic course for secondary otitis media.  Counseled patient on potential for adverse effects with medications prescribed/recommended today, ER and return-to-clinic precautions discussed, patient verbalized understanding.    Christopher Savannah, PA-C 05/06/24 1751

## 2024-05-06 NOTE — ED Triage Notes (Signed)
 Pt reports pain in the right ear x 2 days. Pt has not taken any meds for complaint.

## 2024-05-08 ENCOUNTER — Ambulatory Visit: Admitting: Internal Medicine

## 2024-05-13 ENCOUNTER — Ambulatory Visit: Admitting: Internal Medicine

## 2024-05-13 ENCOUNTER — Encounter: Payer: Self-pay | Admitting: Internal Medicine

## 2024-05-13 VITALS — BP 126/82 | HR 84 | Ht 66.0 in | Wt 271.8 lb

## 2024-05-13 DIAGNOSIS — R7303 Prediabetes: Secondary | ICD-10-CM

## 2024-05-13 DIAGNOSIS — E559 Vitamin D deficiency, unspecified: Secondary | ICD-10-CM

## 2024-05-13 DIAGNOSIS — E538 Deficiency of other specified B group vitamins: Secondary | ICD-10-CM

## 2024-05-13 DIAGNOSIS — Z6841 Body Mass Index (BMI) 40.0 and over, adult: Secondary | ICD-10-CM | POA: Diagnosis not present

## 2024-05-13 MED ORDER — TIRZEPATIDE-WEIGHT MANAGEMENT 7.5 MG/0.5ML ~~LOC~~ SOLN: 7.5000 mg | SUBCUTANEOUS | 5 refills | Status: AC

## 2024-05-13 NOTE — Assessment & Plan Note (Signed)
 On vit D

## 2024-05-13 NOTE — Assessment & Plan Note (Signed)
 On vitamin B12 injections - q 30 d

## 2024-05-13 NOTE — Progress Notes (Signed)
 Subjective:  Patient ID: Kendra Allen, female    DOB: [birth year]  Age: 28 y.o. MRN: [id number]  CC: Medical Management of Chronic Issues (3 Month follow up. [employer]epbound  pricing)   HPI Kendra Allen presents for obesity, depression, vitamin deficiencies  Outpatient Medications Prior to Visit  Medication Sig Dispense Refill   cariprazine  (VRAYLAR ) 3 MG capsule Take 1 capsule (3 mg total) by mouth daily. 30 capsule 2   cariprazine  (VRAYLAR ) 3 MG capsule Take 1 capsule (3 mg total) by mouth daily. 30 capsule 2   clindamycin  (CLEOCIN  T) 1 % external solution Apply 1 Application topically 1 (one) - 2 (two) times daily. 30 mL 5   cyanocobalamin  (VITAMIN B12) 1000 MCG/ML injection Inject 1 mL (1,000 mcg total) into the muscle every 30 (thirty) days. 3 mL 3   hydrOXYzine  (VISTARIL ) 25 MG capsule Take 1 capsule (25 mg total) by mouth every evening. 30 capsule 2   hydrOXYzine  (VISTARIL ) 25 MG capsule Take 1 capsule (25 mg total) by mouth every evening. 30 capsule 2   hydrOXYzine  (VISTARIL ) 25 MG capsule Take 1 capsule (25 mg total) by mouth every evening. 30 capsule 2   modafinil  (PROVIGIL ) 200 MG tablet Take 1 tablet (200 mg total) by mouth in the morning. 30 tablet 2   norethindrone  (AYGESTIN ) 5 MG tablet Take 3 tablets (15 mg total) by mouth daily. 270 tablet 1   norethindrone  (AYGESTIN ) 5 MG tablet Take 3 tablets (15 mg total) by mouth daily. 270 tablet 3   Secukinumab  (COSENTYX  UNOREADY) 300 MG/2ML SOAJ Inject 300 mg (2 ml) into the skin every 28 (twenty-eight) days. 2 mL 3   VITAMIN D  PO Take by mouth.     vortioxetine  HBr (TRINTELLIX ) 20 MG TABS tablet Take 1 tablet (20 mg total) by mouth daily. 30 tablet 2   vortioxetine  HBr (TRINTELLIX ) 20 MG TABS tablet Take 1 tablet (20 mg total) by mouth daily. 30 tablet 2   tirzepatide  (ZEPBOUND ) 5 MG/0.5ML Pen Inject 5 mg into the skin once a week. 2 mL 5   cetirizine  (ZYRTEC  ALLERGY) 10 MG tablet Take 1 tablet (10 mg total) by mouth daily. 30  tablet 0   spironolactone  (ALDACTONE ) 100 MG tablet Take 100 mg by mouth once.     No facility-administered medications prior to visit.    ROS: Review of Systems  Constitutional:  Negative for activity change, appetite change, chills, fatigue and unexpected weight change.  HENT:  Negative for congestion, mouth sores and sinus pressure.   Eyes:  Negative for visual disturbance.  Respiratory:  Negative for cough and chest tightness.   Gastrointestinal:  Negative for abdominal pain and nausea.  Genitourinary:  Negative for difficulty urinating, frequency and vaginal pain.  Musculoskeletal:  Negative for back pain and gait problem.  Skin:  Negative for pallor and rash.  Neurological:  Negative for dizziness, tremors, weakness, numbness and headaches.  Psychiatric/Behavioral:  Negative for confusion, sleep disturbance and suicidal ideas.     Objective:  BP 126/82   Pulse 84   Ht 5' 6 (1.676 m)   Wt 271 lb 12.8 oz (123.3 kg)   SpO2 98%   BMI 43.87 kg/m   BP Readings from Last 3 Encounters:  05/13/24 126/82  05/06/24 116/76  01/31/24 120/79    Wt Readings from Last 3 Encounters:  05/13/24 271 lb 12.8 oz (123.3 kg)  01/31/24 277 lb (125.6 kg)  01/07/24 276 lb 12.8 oz (125.6 kg)    Physical Exam Constitutional:  General: She is not in acute distress.    Appearance: She is well-developed. She is obese.  HENT:     Head: Normocephalic.     Right Ear: External ear normal.     Left Ear: External ear normal.     Nose: Nose normal.  Eyes:     General:        Right eye: No discharge.        Left eye: No discharge.     Conjunctiva/sclera: Conjunctivae normal.     Pupils: Pupils are equal, round, and reactive to light.  Neck:     Thyroid : No thyromegaly.     Vascular: No JVD.     Trachea: No tracheal deviation.  Cardiovascular:     Rate and Rhythm: Normal rate and regular rhythm.     Heart sounds: Normal heart sounds.  Pulmonary:     Effort: No respiratory distress.      Breath sounds: No stridor. No wheezing.  Abdominal:     General: Bowel sounds are normal. There is no distension.     Palpations: Abdomen is soft. There is no mass.     Tenderness: There is no abdominal tenderness. There is no guarding or rebound.  Musculoskeletal:        General: No tenderness.     Cervical back: Normal range of motion and neck supple. No rigidity.  Lymphadenopathy:     Cervical: No cervical adenopathy.  Skin:    Findings: No erythema or rash.  Neurological:     Mental Status: She is oriented to person, place, and time.     Cranial Nerves: No cranial nerve deficit.     Motor: No abnormal muscle tone.     Coordination: Coordination normal.     Deep Tendon Reflexes: Reflexes normal.  Psychiatric:        Behavior: Behavior normal.        Thought Content: Thought content normal.        Judgment: Judgment normal.     Lab Results  Component Value Date   WBC 9.3 10/11/2023   HGB 13.1 10/11/2023   HCT 38.7 10/11/2023   PLT 363.0 10/11/2023   GLUCOSE 97 10/11/2023   CHOL 124 10/11/2023   TRIG 69.0 10/11/2023   HDL 30.20 (L) 10/11/2023   LDLCALC 80 10/11/2023   ALT 24 10/11/2023   AST 16 10/11/2023   NA 139 10/11/2023   K 3.7 10/11/2023   CL 106 10/11/2023   CREATININE 0.59 10/11/2023   BUN 8 10/11/2023   CO2 26 10/11/2023   TSH 2.34 10/11/2023   HGBA1C 5.9 10/19/2023    No results found.  Assessment & Plan:   Problem List Items Addressed This Visit     B12 deficiency - Primary   On vitamin B12 injections - q 30 d      Obesity, morbid, BMI 40.0-49.9 (HCC)   Mounjaro  in vials was prescribed      Relevant Medications   tirzepatide  7.5 MG/0.5ML injection vial   Prediabetes   Continue with weight loss effort      Vitamin D  deficiency   On vit D         Meds ordered this encounter  Medications   tirzepatide  7.5 MG/0.5ML injection vial    Sig: Inject 7.5 mg into the skin once a week.    Dispense:  2 mL    Refill:  5       Follow-up: Return in about 3 months (around 08/13/2024) for  a follow-up visit.  Marolyn Noel, MD

## 2024-05-17 NOTE — Assessment & Plan Note (Signed)
Continue with weight loss effort

## 2024-05-17 NOTE — Assessment & Plan Note (Signed)
 Mounjaro  in vials was prescribed

## 2024-05-20 ENCOUNTER — Other Ambulatory Visit (HOSPITAL_COMMUNITY): Payer: Self-pay

## 2024-05-21 ENCOUNTER — Other Ambulatory Visit: Payer: Self-pay

## 2024-05-22 ENCOUNTER — Other Ambulatory Visit (HOSPITAL_COMMUNITY): Payer: Self-pay

## 2024-05-22 ENCOUNTER — Other Ambulatory Visit: Payer: Self-pay

## 2024-05-23 ENCOUNTER — Other Ambulatory Visit: Payer: Self-pay | Admitting: Pharmacy Technician

## 2024-05-23 ENCOUNTER — Other Ambulatory Visit (HOSPITAL_COMMUNITY): Payer: Self-pay

## 2024-05-23 ENCOUNTER — Other Ambulatory Visit: Payer: Self-pay

## 2024-05-23 NOTE — Progress Notes (Signed)
 Specialty Pharmacy Refill Coordination Note  Kendra Allen is a 28 y.o. female contacted today regarding refills of specialty medication(s)   Secukinumab  (Cosentyx  UnoReady)    Patient requested (Patient-Rptd) Delivery   Delivery date: 05/27/2024 Verified address: (Patient-Rptd) [address] [name] [address]   Medication will be filled on: 05/26/2024  This fill date is pending response to refill request from provider. Patient is aware and if they have not received fill by intended date they must follow up with pharmacy.   Manually faxed MD 05/23/2024

## 2024-05-26 ENCOUNTER — Ambulatory Visit: Admitting: Psychology

## 2024-05-26 DIAGNOSIS — F331 Major depressive disorder, recurrent, moderate: Secondary | ICD-10-CM | POA: Diagnosis not present

## 2024-05-26 NOTE — Progress Notes (Signed)
 [facility] Counselor/Therapist Progress Note  Patient ID: Kendra Allen, MRN: [id number],    Date: 05/26/2024  Time Spent: 5:00pm - 5:45pm   45 minutes   Treatment Type: Individual Therapy  Reported Symptoms: stress  Mental Status Exam: Appearance:  Casual     Behavior: Appropriate  Motor: Normal  Speech/Language:  Normal Rate  Affect: Appropriate  Mood: normal  Thought process: normal  Thought content:   WNL  Sensory/Perceptual disturbances:   WNL  Orientation: oriented to person, place, time/date, and situation  Attention: Good  Concentration: Good  Memory: WNL  Fund of knowledge:  Good  Insight:   Good  Judgment:  Good  Impulse Control: Good   Risk Assessment: Danger to Self:  No Self-injurious Behavior: No Danger to Others: No Duty to Warn:no Physical Aggression / Violence:No  Access to Firearms a concern: No  Gang Involvement:No   Subjective: Pt present for face-to-face individual therapy via video.  Pt consents to telehealth video session and is aware of limitations and benefits of virtual sessions.  Location of pt: home Location of therapist: home office.   Pt talked about work.  Pt goes to work consistently but does not like the job.  They are short staffed which makes work more stressful.  Addressed how pt can make the best of her work days.  Pt is finishing her college classes and will have a month off.  She starts the spring semester on January 13th.  Pt talked about the holidays.  They had a family gathering for Thanksgiving and had a get together for her grandmother's birthday.  She turned 86.  Pt use to be close to her grandmother but has withdrawn from her since her grandmother has been declining. Pt gets sad and frustrated about her grandmother's cognitive decline.  Helped pt process her feelings.  Pt has been staying connected with friends and is planning a trip to [place] so she has something to look forward to.    Pt states  overall her mood has been stable.  Pt is working on self growth and working on being more resilient.    Worked on self care strategies. Provided supportive therapy.  Interventions: Cognitive Behavioral Therapy and Insight-Oriented  Diagnosis: F33.1  Plan of Care: Recommend ongoing therapy.  Pt participated in setting treatment goals.   She wants to improve coping skills.  Plan to meet every two weeks.  Pt agrees with treatment plan.   Treatment Plan  (treatment plan target date: 07/23/2024). Client Abilities/Strengths  Pt is bright and motivated for therapy. Client Treatment Preferences  Individual therapy.  Client Statement of Needs  Improve coping skills.  Symptoms  Depressed or irritable mood.  Diminished interest in or enjoyment of activities.  Problems Addressed  Unipolar Depression Goals 1. Alleviate depressive symptoms and return to previous level of effective functioning. 2. Appropriately grieve the loss in order to normalize mood and to return to previously adaptive level of functioning. Objective Learn and implement behavioral strategies to overcome depression. Target Date: 2024-07-23 Frequency: Biweekly  Progress: 50 Modality: individual  Related Interventions Engage the client in behavioral activation, increasing his/her activity level and contact with sources of reward, while identifying processes that inhibit activation.  Use behavioral techniques such as instruction, rehearsal, role-playing, role reversal, as needed, to facilitate activity in the client's daily life; reinforce success. Assist the client in developing skills that increase the likelihood of deriving pleasure from behavioral activation (e.g., assertiveness skills, developing an exercise plan,  less internal/more external focus, increased social involvement); reinforce success. Objective Identify important people in life, past and present, and describe the quality, good and poor, of those  relationships. Target Date: 2024-07-23 Frequency: Biweekly  Progress: 50 Modality: individual  Related Interventions Conduct Interpersonal Therapy beginning with the assessment of the client's interpersonal inventory of important past and present relationships; develop a case formulation linking depression to grief, interpersonal role disputes, role transitions, and/or interpersonal deficits). Objective Learn and implement problem-solving and decision-making skills. Target Date: 2024-07-23 Frequency: Biweekly  Progress: 50 Modality: individual  Related Interventions Conduct Problem-Solving Therapy using techniques such as psychoeducation, modeling, and role-playing to teach client problem-solving skills (i.e., defining a problem specifically, generating possible solutions, evaluating the pros and cons of each solution, selecting and implementing a plan of action, evaluating the efficacy of the plan, accepting or revising the plan); role-play application of the problem-solving skill to a real life issue. Encourage in the client the development of a positive problem orientation in which problems and solving them are viewed as a natural part of life and not something to be feared, despaired, or avoided. 3. Develop healthy interpersonal relationships that lead to the alleviation and help prevent the relapse of depression. 4. Develop healthy thinking patterns and beliefs about self, others, and the world that lead to the alleviation and help prevent the relapse of depression. 5. Recognize, accept, and cope with feelings of depression. Diagnosis F33.1  Conditions For Discharge Achievement of treatment goals and objectives   Veva Alma, LCSW

## 2024-05-27 ENCOUNTER — Other Ambulatory Visit: Payer: Self-pay | Admitting: Pharmacist

## 2024-05-27 ENCOUNTER — Telehealth: Payer: Self-pay

## 2024-05-27 ENCOUNTER — Other Ambulatory Visit: Payer: Self-pay

## 2024-05-27 ENCOUNTER — Other Ambulatory Visit (HOSPITAL_COMMUNITY): Payer: Self-pay

## 2024-05-27 MED ORDER — COSENTYX UNOREADY 300 MG/2ML ~~LOC~~ SOAJ: SUBCUTANEOUS | 1 refills | Status: DC

## 2024-05-27 MED ORDER — COSENTYX UNOREADY 300 MG/2ML ~~LOC~~ SOAJ
SUBCUTANEOUS | 1 refills | Status: DC
  Filled 2024-05-27: qty 2, 28d supply, fill #0
  Filled 2024-06-18 – 2024-06-20 (×2): qty 2, 28d supply, fill #1

## 2024-05-27 NOTE — Telephone Encounter (Signed)
 Pharmacy Patient Advocate Encounter   Received notification from Pt Calls Messages that prior authorization for Cosentyx  is required/requested.   Insurance verification completed.   The patient is insured through [facility].   Per test claim: PA required; PA submitted to above mentioned insurance via Latent Key/confirmation #/EOC [id number] Status is pending

## 2024-05-28 ENCOUNTER — Encounter (HOSPITAL_COMMUNITY): Payer: Self-pay

## 2024-05-28 ENCOUNTER — Other Ambulatory Visit: Payer: Self-pay

## 2024-05-29 ENCOUNTER — Other Ambulatory Visit (HOSPITAL_COMMUNITY): Payer: Self-pay

## 2024-05-29 ENCOUNTER — Other Ambulatory Visit: Payer: Self-pay

## 2024-05-29 NOTE — Telephone Encounter (Signed)
 Pharmacy Patient Advocate Encounter  Received notification from [facility] that Prior Authorization for Cosentyx  has been APPROVED from 05/29/24 to 05/28/25   PA #/Case ID/Reference #: [id number]

## 2024-06-02 ENCOUNTER — Other Ambulatory Visit (HOSPITAL_COMMUNITY): Payer: Self-pay

## 2024-06-02 ENCOUNTER — Ambulatory Visit
Admission: EM | Admit: 2024-06-02 | Discharge: 2024-06-02 | Disposition: A | Discharge status: Home / Self Care | Attending: Family Medicine | Admitting: Family Medicine

## 2024-06-02 DIAGNOSIS — H6993 Unspecified Eustachian tube disorder, bilateral: Secondary | ICD-10-CM | POA: Diagnosis not present

## 2024-06-02 DIAGNOSIS — J309 Allergic rhinitis, unspecified: Secondary | ICD-10-CM | POA: Diagnosis not present

## 2024-06-02 DIAGNOSIS — H9202 Otalgia, left ear: Secondary | ICD-10-CM

## 2024-06-02 DIAGNOSIS — H65192 Other acute nonsuppurative otitis media, left ear: Secondary | ICD-10-CM | POA: Diagnosis not present

## 2024-06-02 MED ORDER — PREDNISONE 20 MG PO TABS
40.0000 mg | ORAL_TABLET | Freq: Every day | ORAL | 0 refills | Status: AC
  Filled 2024-06-02: qty 10, 5d supply, fill #0

## 2024-06-02 MED ORDER — AMOXICILLIN-POT CLAVULANATE 875-125 MG PO TABS
1.0000 | ORAL_TABLET | Freq: Two times a day (BID) | ORAL | 0 refills | Status: AC
  Filled 2024-06-02: qty 14, 7d supply, fill #0

## 2024-06-02 NOTE — Discharge Instructions (Addendum)
 Let's use one more round of prednisone  for persistent eustachian tube dysfunction. Use amoxicillin -clavulanate to cover for a middle ear infection of the left side. Maintain Zyrtec  daily. Once you finish prednisone  then start pseudoephedrine  30mg  once every 6 hours as needed. Follow up with ENT if symptoms persist.

## 2024-06-02 NOTE — ED Triage Notes (Signed)
 Pt reports nasal congestion, scratchy throat with drainage, left ear pain x 5 days, worse in the mronings. Pt has not taken any meds for complaints.

## 2024-06-02 NOTE — ED Provider Notes (Signed)
 [facility]  Note:  This document was prepared using Conservation officer, historic buildings and may include unintentional dictation errors.  MRN: [id number] DOB: Nov 11, 1995  Subjective:   Kendra Allen is a 28 y.o. female presenting for recheck on recurrent bilateral ear fullness, left ear pain, sinus congestion, scratchy throat, post-nasal drainage. Was last seen about 1 month ago. Underwent a steroid course with prednisone  for similar symptoms. Did well while on the steroids, symptoms returned shortly thereafter. Has history of ETD, allergic rhinitis, ear infections.   Current Outpatient Medications  Medication Instructions   clindamycin  (CLEOCIN  T) 1 % external solution Apply 1 Application topically 1 (one) - 2 (two) times daily.   cyanocobalamin  (VITAMIN B12) 1,000 mcg, Intramuscular, Every 30 days   hydrOXYzine  (VISTARIL ) 25 mg, Oral, Every evening   hydrOXYzine  (VISTARIL ) 25 mg, Oral, Every evening   hydrOXYzine  (VISTARIL ) 25 mg, Oral, Every evening   modafinil  (PROVIGIL ) 200 mg, Oral, Every morning   norethindrone  (AYGESTIN ) 15 mg, Oral, Daily   norethindrone  (AYGESTIN ) 15 mg, Oral, Daily   Secukinumab  (COSENTYX  UNOREADY) 300 MG/2ML SOAJ Inject under skin as directed every 4 weeks.   tirzepatide  7.5 mg, Subcutaneous, Weekly   Trintellix  20 mg, Oral, Daily   Trintellix  20 mg, Oral, Daily   VITAMIN D  PO Take by mouth.   Vraylar  3 mg, Oral, Daily   Vraylar  3 mg, Oral, Daily    Allergies[1]  Past Medical History:  Diagnosis Date   Anemia    Anxiety    Chest pain    Constipation    Cyst of right ovary 12/14/2020   Depression    Endometriosis of pelvis    Family history of breast cancer    Family history of colon cancer    Family history of ovarian cancer    Family history of prostate cancer    Hidradenitis suppurativa    PCOS (polycystic ovarian syndrome)    Prediabetes    Sleep apnea    Vitamin D  deficiency      Past Surgical History:   Procedure Laterality Date   CERVICAL POLYPECTOMY     DILATATION & CURETTAGE/HYSTEROSCOPY WITH MYOSURE N/A 11/21/2016   Procedure: DILATATION & CURETTAGE/HYSTEROSCOPY WITH MYOSURE;  Surgeon: Rutherford Gain, MD;  Location: [facility];  Service: Gynecology;  [name];   ROBOTIC ASSISTED LAPAROSCOPIC OVARIAN CYSTECTOMY N/A 12/30/2020   Procedure: XI ROBOTIC ASSISTED LAPAROSCOPIC RIGHT SALPINGO-OOPHORECTOMY WITH PELVIC WASHINGS;  Surgeon: Viktoria Comer SAUNDERS, MD;  [name];  Service: Gynecology;  [name];   WISDOM TOOTH EXTRACTION      Family History  Problem Relation Age of Onset   Hypertension Mother    Depression Mother    Obesity Mother    Sleep apnea Father    Obesity Father    Colon cancer Maternal Uncle 46   Diabetes Paternal Aunt    Heart attack Maternal Grandmother    Prostate cancer Maternal Grandfather        dx 51s, metastatic   Stroke Paternal Grandfather    Breast cancer Half-Sister 64   Ovarian cancer Maternal Great-grandmother        dx ?, MGF's mother   Breast cancer Other        multiple of mother's paternal cousins   Ovarian cancer Other        multiple of mother's paternal cousins   Prostate cancer Other        multiple of mother's paternal cousins   Endometrial cancer Neg Hx  Pancreatic cancer Neg Hx     Social History   Occupational History   Occupation: engineer, site  Tobacco Use   Smoking status: Never   Smokeless tobacco: Never  Vaping Use   Vaping status: Never Used  Substance and Sexual Activity   Alcohol use: Yes    Comment: social   Drug use: Never   Sexual activity: Not Currently    Birth control/protection: Pill     ROS   Objective:   Vitals: BP 107/75 (BP Location: Right Arm)   Pulse 86   Temp 98 F (36.7 C) (Oral)   Resp 16   SpO2 98%   Physical Exam Constitutional:      General: She is not in acute distress.    Appearance: Normal appearance. She is well-developed and normal weight. She is not  ill-appearing, toxic-appearing or diaphoretic.  HENT:     Head: Normocephalic and atraumatic.     Right Ear: Ear canal and external ear normal. No drainage or tenderness. No middle ear effusion. There is no impacted cerumen. Tympanic membrane is not injected, perforated, erythematous or bulging.     Left Ear: Ear canal and external ear normal. No drainage or tenderness.  No middle ear effusion. There is no impacted cerumen. Tympanic membrane is not injected, perforated, erythematous or bulging.     Ears:     Comments: Mild bilateral ear effusions.     Nose: Congestion present. No rhinorrhea.     Mouth/Throat:     Mouth: Mucous membranes are moist. No oral lesions.     Pharynx: No pharyngeal swelling, oropharyngeal exudate, posterior oropharyngeal erythema or uvula swelling.     Tonsils: No tonsillar exudate or tonsillar abscesses.     Comments: Thick streaks of postnasal drainage overlying pharynx.  Eyes:     General: No scleral icterus.       Right eye: No discharge.        Left eye: No discharge.     Extraocular Movements: Extraocular movements intact.     Right eye: Normal extraocular motion.     Left eye: Normal extraocular motion.     Conjunctiva/sclera: Conjunctivae normal.  Cardiovascular:     Rate and Rhythm: Normal rate.  Pulmonary:     Effort: Pulmonary effort is normal.  Musculoskeletal:     Cervical back: Normal range of motion and neck supple.  Lymphadenopathy:     Cervical: No cervical adenopathy.  Skin:    General: Skin is warm and dry.  Neurological:     General: No focal deficit present.     Mental Status: She is alert and oriented to person, place, and time.  Psychiatric:        Mood and Affect: Mood normal.        Behavior: Behavior normal.     Assessment and Plan :   PDMP not reviewed this encounter.  1. Eustachian tube dysfunction, bilateral   2. Left ear pain   3. Allergic rhinitis, unspecified seasonality, unspecified trigger   4. Other  non-recurrent acute nonsuppurative otitis media of left ear      Recommend repeat prednisone  for allergic rhinitis, ETD exacerbation and will cover for secondary OM of the left ear with Augmentin . Start Zyrtec  daily, can use Sudafed following prednisone  course. Follow up with ENT if symptoms persist. Counseled patient on potential for adverse effects with medications prescribed/recommended today, ER and return-to-clinic precautions discussed, patient verbalized understanding.     [1]  Allergies Allergen Reactions   Doxycycline   Upset stomach   Sulfamethoxazole-Trimethoprim     upset stomach     Christopher Savannah, [place] 06/02/24 1317

## 2024-06-03 ENCOUNTER — Other Ambulatory Visit (HOSPITAL_COMMUNITY): Payer: Self-pay

## 2024-06-06 ENCOUNTER — Other Ambulatory Visit (HOSPITAL_COMMUNITY): Payer: Self-pay

## 2024-06-18 ENCOUNTER — Other Ambulatory Visit: Payer: Self-pay

## 2024-06-20 ENCOUNTER — Other Ambulatory Visit: Payer: Self-pay

## 2024-06-23 ENCOUNTER — Other Ambulatory Visit: Payer: Self-pay

## 2024-06-23 NOTE — Progress Notes (Signed)
 Specialty Pharmacy Refill Coordination Note  Kendra Allen is a 29 y.o. female contacted today regarding refills of specialty medication(s) Secukinumab  (Cosentyx  UnoReady)   Patient requested Delivery   Delivery date: 06/25/24   Verified address: [address] [name] [address]   Medication will be filled on: 06/24/24

## 2024-06-24 ENCOUNTER — Other Ambulatory Visit: Payer: Self-pay

## 2024-06-24 ENCOUNTER — Ambulatory Visit: Admitting: Psychology

## 2024-06-24 DIAGNOSIS — F331 Major depressive disorder, recurrent, moderate: Secondary | ICD-10-CM

## 2024-06-24 NOTE — Progress Notes (Signed)
 "    [facility] Counselor/Therapist Progress Note  Patient ID: Kendra Allen, MRN: [id number],    Date: 06/24/2024  Time Spent: 5:00pm - 5:45pm   45 minutes   Treatment Type: Individual Therapy  Reported Symptoms: stress  Mental Status Exam: Appearance:  Casual     Behavior: Appropriate  Motor: Normal  Speech/Language:  Normal Rate  Affect: Appropriate  Mood: normal  Thought process: normal  Thought content:   WNL  Sensory/Perceptual disturbances:   WNL  Orientation: oriented to person, place, time/date, and situation  Attention: Good  Concentration: Good  Memory: WNL  Fund of knowledge:  Good  Insight:   Good  Judgment:  Good  Impulse Control: Good   Risk Assessment: Danger to Self:  No Self-injurious Behavior: No Danger to Others: No Duty to Warn:no Physical Aggression / Violence:No  Access to Firearms a concern: No  Gang Involvement:No   Subjective: Pt present for face-to-face individual therapy via video.  Pt consents to telehealth video session and is aware of limitations and benefits of virtual sessions.  Location of pt: home Location of therapist: home office.   Pt talked about classes starting back up next week.  She will then be very busy with work and school.   Pt states that at times she is very tired and doesn't want to do anything.  She pressures herself to be productive.  Addressed how some down time to recharge can be good for her.  Pt states she still gets down at times that she is still living with her parents and doesn't have her own place.  Addressed that pt is working her plan for developing a good career and eventually moving out on her own.   Pt has been staying connected with friends. She has several events planned with friends the next few months which gives pt things to look forward to.   Pt states overall her mood has been stable.  Pt is working on self growth and working on being more resilient.    Pt is working on being more  mindful of her eating and drinking more water .   Worked on self care strategies. Provided supportive therapy.  Interventions: Cognitive Behavioral Therapy and Insight-Oriented  Diagnosis: F33.1  Plan of Care: Recommend ongoing therapy.  Pt participated in setting treatment goals.   She wants to improve coping skills.  Plan to meet every two weeks.  Pt agrees with treatment plan.   Treatment Plan  (treatment plan target date: 07/23/2024). Client Abilities/Strengths  Pt is bright and motivated for therapy. Client Treatment Preferences  Individual therapy.  Client Statement of Needs  Improve coping skills.  Symptoms  Depressed or irritable mood.  Diminished interest in or enjoyment of activities.  Problems Addressed  Unipolar Depression Goals 1. Alleviate depressive symptoms and return to previous level of effective functioning. 2. Appropriately grieve the loss in order to normalize mood and to return to previously adaptive level of functioning. Objective Learn and implement behavioral strategies to overcome depression. Target Date: 2024-07-23 Frequency: Biweekly  Progress: 50 Modality: individual  Related Interventions Engage the client in behavioral activation, increasing his/her activity level and contact with sources of reward, while identifying processes that inhibit activation.  Use behavioral techniques such as instruction, rehearsal, role-playing, role reversal, as needed, to facilitate activity in the client's daily life; reinforce success. Assist the client in developing skills that increase the likelihood of deriving pleasure from behavioral activation (e.g., assertiveness skills, developing an exercise plan, less internal/more  external focus, increased social involvement); reinforce success. Objective Identify important people in life, past and present, and describe the quality, good and poor, of those relationships. Target Date: 2024-07-23 Frequency: Biweekly  Progress:  50 Modality: individual  Related Interventions Conduct Interpersonal Therapy beginning with the assessment of the client's interpersonal inventory of important past and present relationships; develop a case formulation linking depression to grief, interpersonal role disputes, role transitions, and/or interpersonal deficits). Objective Learn and implement problem-solving and decision-making skills. Target Date: 2024-07-23 Frequency: Biweekly  Progress: 50 Modality: individual  Related Interventions Conduct Problem-Solving Therapy using techniques such as psychoeducation, modeling, and role-playing to teach client problem-solving skills (i.e., defining a problem specifically, generating possible solutions, evaluating the pros and cons of each solution, selecting and implementing a plan of action, evaluating the efficacy of the plan, accepting or revising the plan); role-play application of the problem-solving skill to a real life issue. Encourage in the client the development of a positive problem orientation in which problems and solving them are viewed as a natural part of life and not something to be feared, despaired, or avoided. 3. Develop healthy interpersonal relationships that lead to the alleviation and help prevent the relapse of depression. 4. Develop healthy thinking patterns and beliefs about self, others, and the world that lead to the alleviation and help prevent the relapse of depression. 5. Recognize, accept, and cope with feelings of depression. Diagnosis F33.1  Conditions For Discharge Achievement of treatment goals and objectives   Veva Alma, LCSW   "

## 2024-07-01 ENCOUNTER — Encounter: Payer: Self-pay | Admitting: Obstetrics and Gynecology

## 2024-07-06 ENCOUNTER — Ambulatory Visit
Admission: RE | Admit: 2024-07-06 | Discharge: 2024-07-06 | Disposition: A | Discharge status: Home / Self Care | Source: Ambulatory Visit | Attending: Family Medicine | Admitting: Family Medicine

## 2024-07-06 VITALS — BP 109/70 | HR 103 | Temp 99.1°F | Resp 20

## 2024-07-06 DIAGNOSIS — R0981 Nasal congestion: Secondary | ICD-10-CM | POA: Diagnosis not present

## 2024-07-06 DIAGNOSIS — U071 COVID-19: Secondary | ICD-10-CM

## 2024-07-06 LAB — POC COVID19/FLU A&B COMBO
Covid Antigen, POC: POSITIVE — AB
Influenza A Antigen, POC: NEGATIVE
Influenza B Antigen, POC: NEGATIVE

## 2024-07-06 MED ORDER — PROMETHAZINE-DM 6.25-15 MG/5ML PO SYRP
5.0000 mL | ORAL_SOLUTION | Freq: Every evening | ORAL | 0 refills | Status: DC | PRN
  Filled 2024-07-06: qty 100, 20d supply, fill #0

## 2024-07-06 NOTE — Discharge Instructions (Addendum)
 You tested positive for COVID 19 infection. For sore throat or cough try using a honey-based tea. Use 3 teaspoons of honey with juice squeezed from half lemon. Place shaved pieces of ginger into 1/2-1 cup of water  and warm over stove top. Then mix the ingredients and repeat every 4 hours as needed. Please take ibuprofen  600mg  every 6 hours with food alternating with OR taken together with Tylenol  500mg -650mg  every 6 hours for throat pain, fevers, aches and pains. Hydrate very well with at least 2 liters of water . Eat light meals such as soups (chicken and noodles, vegetable, chicken and wild rice).  Do not eat foods that you are allergic to.  Taking an antihistamine like Zyrtec  (10mg  daily) can help against postnasal drainage, sinus congestion which can cause sinus pain, sinus headaches, throat pain, painful swallowing, coughing.  You can take this together with pseudoephedrine  (Sudafed) at a dose of 30 mg 3 times a day or twice daily as needed for the same kind of nasal drip, congestion.  Use cough syrup as needed.

## 2024-07-06 NOTE — ED Provider Notes (Signed)
 " [facility]  Note:  This document was prepared using Conservation officer, historic buildings and may include unintentional dictation errors.  MRN: [id number] DOB: 1996-01-09  Subjective:   Kendra Allen is a 29 y.o. female presenting for 1 week history of recurrent sinus congestion, runny nose, pain and fullness in both ears.  No cough, chest pain, shortness of breath or wheezing.  Has a history of chronic allergic rhinitis.  No asthma.  No smoking of any kind including cigarettes, cigars, vaping, marijuana use.    Current Outpatient Medications  Medication Instructions   clindamycin  (CLEOCIN  T) 1 % external solution Apply 1 Application topically 1 (one) - 2 (two) times daily.   cyanocobalamin  (VITAMIN B12) 1,000 mcg, Intramuscular, Every 30 days   hydrOXYzine  (VISTARIL ) 25 mg, Oral, Every evening   hydrOXYzine  (VISTARIL ) 25 mg, Oral, Every evening   hydrOXYzine  (VISTARIL ) 25 mg, Oral, Every evening   modafinil  (PROVIGIL ) 200 mg, Oral, Every morning   norethindrone  (AYGESTIN ) 15 mg, Oral, Daily   norethindrone  (AYGESTIN ) 15 mg, Oral, Daily   Secukinumab  (COSENTYX  UNOREADY) 300 MG/2ML SOAJ Inject under skin as directed every 4 weeks.   tirzepatide  7.5 mg, Subcutaneous, Weekly   Trintellix  20 mg, Oral, Daily   Trintellix  20 mg, Oral, Daily   VITAMIN D  PO Take by mouth.   Vraylar  3 mg, Oral, Daily   Vraylar  3 mg, Oral, Daily    Allergies[1]  Past Medical History:  Diagnosis Date   Anemia    Anxiety    Chest pain    Constipation    Cyst of right ovary 12/14/2020   Depression    Endometriosis of pelvis    Family history of breast cancer    Family history of colon cancer    Family history of ovarian cancer    Family history of prostate cancer    Hidradenitis suppurativa    PCOS (polycystic ovarian syndrome)    Prediabetes    Sleep apnea    Vitamin D  deficiency      Past Surgical History:  Procedure Laterality Date   CERVICAL POLYPECTOMY      DILATATION & CURETTAGE/HYSTEROSCOPY WITH MYOSURE N/A 11/21/2016   Procedure: DILATATION & CURETTAGE/HYSTEROSCOPY WITH MYOSURE;  Surgeon: Rutherford Gain, MD;  Location: [facility];  Service: Gynecology;  [name];   ROBOTIC ASSISTED LAPAROSCOPIC OVARIAN CYSTECTOMY N/A 12/30/2020   Procedure: XI ROBOTIC ASSISTED LAPAROSCOPIC RIGHT SALPINGO-OOPHORECTOMY WITH PELVIC WASHINGS;  Surgeon: Viktoria Comer SAUNDERS, MD;  [name];  Service: Gynecology;  [name];   WISDOM TOOTH EXTRACTION      Family History  Problem Relation Age of Onset   Hypertension Mother    Depression Mother    Obesity Mother    Sleep apnea Father    Obesity Father    Colon cancer Maternal Uncle 45   Diabetes Paternal Aunt    Heart attack Maternal Grandmother    Prostate cancer Maternal Grandfather        dx 50s, metastatic   Stroke Paternal Grandfather    Breast cancer Half-Sister 16   Ovarian cancer Maternal Great-grandmother        dx ?, MGF's mother   Breast cancer Other        multiple of mother's paternal cousins   Ovarian cancer Other        multiple of mother's paternal cousins   Prostate cancer Other        multiple of mother's paternal cousins   Endometrial cancer Neg Hx  Pancreatic cancer Neg Hx     Social History   Occupational History   Occupation: engineer, site  Tobacco Use   Smoking status: Never   Smokeless tobacco: Never  Vaping Use   Vaping status: Never Used  Substance and Sexual Activity   Alcohol use: Yes    Comment: occ   Drug use: Never   Sexual activity: Not Currently    Birth control/protection: Pill     ROS   Objective:   Vitals: BP 109/70 (BP Location: Right Arm)   Pulse (!) 103   Temp 99.1 F (37.3 C) (Oral)   Resp 20   SpO2 98%   Physical Exam Constitutional:      General: She is not in acute distress.    Appearance: Normal appearance. She is well-developed and normal weight. She is not ill-appearing, toxic-appearing or diaphoretic.   HENT:     Head: Normocephalic and atraumatic.     Right Ear: Tympanic membrane, ear canal and external ear normal. No drainage or tenderness. No middle ear effusion. There is no impacted cerumen. Tympanic membrane is not erythematous or bulging.     Left Ear: Tympanic membrane, ear canal and external ear normal. No drainage or tenderness.  No middle ear effusion. There is no impacted cerumen. Tympanic membrane is not erythematous or bulging.     Nose: Nose normal. No congestion or rhinorrhea.     Mouth/Throat:     Mouth: Mucous membranes are moist. No oral lesions.     Pharynx: No pharyngeal swelling, oropharyngeal exudate, posterior oropharyngeal erythema or uvula swelling.     Tonsils: No tonsillar exudate or tonsillar abscesses.  Eyes:     General: No scleral icterus.       Right eye: No discharge.        Left eye: No discharge.     Extraocular Movements: Extraocular movements intact.     Right eye: Normal extraocular motion.     Left eye: Normal extraocular motion.     Conjunctiva/sclera: Conjunctivae normal.  Cardiovascular:     Rate and Rhythm: Normal rate and regular rhythm.     Heart sounds: Normal heart sounds. No murmur heard.    No friction rub. No gallop.  Pulmonary:     Effort: Pulmonary effort is normal. No respiratory distress.     Breath sounds: No stridor. No wheezing, rhonchi or rales.  Chest:     Chest wall: No tenderness.  Musculoskeletal:     Cervical back: Normal range of motion and neck supple.  Lymphadenopathy:     Cervical: No cervical adenopathy.  Skin:    General: Skin is warm and dry.  Neurological:     General: No focal deficit present.     Mental Status: She is alert and oriented to person, place, and time.  Psychiatric:        Mood and Affect: Mood normal.        Behavior: Behavior normal.     Results for orders placed or performed during the hospital encounter of 07/06/24 (from the past 72 hours)  POC Covid19/Flu A&B Antigen     Status:  Abnormal   Collection Time: 07/06/24  2:16 PM  Result Value Ref Range   Influenza A Antigen, POC Negative Negative   Influenza B Antigen, POC Negative Negative   Covid Antigen, POC Positive (A) Negative     Assessment and Plan :   PDMP not reviewed this encounter.  1. COVID-19 virus infection   2. Sinus congestion  Discussed the use of Paxlovid  but patient would have significant medication interactions.  Deferred imaging given clear pulmonary exam. For her COVID-19 infection, advised supportive care, offered symptomatic relief. Counseled patient on potential for adverse effects with medications prescribed/recommended today, ER and return-to-clinic precautions discussed, patient verbalized understanding.       [1]  Allergies Allergen Reactions   Doxycycline      Upset stomach   Sulfamethoxazole-Trimethoprim     upset stomach     Christopher Savannah, PA-C 07/09/24 0831  "

## 2024-07-06 NOTE — ED Triage Notes (Signed)
 Pt c/o nasal congestion, runny nose, pain in both ears started yesterday-last dose motrin  yesterday-NAD-steady gait

## 2024-07-07 ENCOUNTER — Other Ambulatory Visit (HOSPITAL_COMMUNITY): Payer: Self-pay

## 2024-07-09 ENCOUNTER — Encounter

## 2024-07-10 ENCOUNTER — Other Ambulatory Visit (HOSPITAL_COMMUNITY): Payer: Self-pay

## 2024-07-10 ENCOUNTER — Encounter: Payer: Self-pay | Admitting: Internal Medicine

## 2024-07-10 ENCOUNTER — Ambulatory Visit: Admitting: Obstetrics and Gynecology

## 2024-07-10 ENCOUNTER — Encounter: Payer: Self-pay | Admitting: Obstetrics and Gynecology

## 2024-07-10 VITALS — BP 98/64 | HR 94 | Temp 98.1°F | Ht 67.0 in | Wt 265.0 lb

## 2024-07-10 DIAGNOSIS — Z9189 Other specified personal risk factors, not elsewhere classified: Secondary | ICD-10-CM | POA: Diagnosis not present

## 2024-07-10 DIAGNOSIS — L732 Hidradenitis suppurativa: Secondary | ICD-10-CM | POA: Diagnosis not present

## 2024-07-10 DIAGNOSIS — Z1331 Encounter for screening for depression: Secondary | ICD-10-CM | POA: Diagnosis not present

## 2024-07-10 DIAGNOSIS — N809 Endometriosis, unspecified: Secondary | ICD-10-CM

## 2024-07-10 DIAGNOSIS — E282 Polycystic ovarian syndrome: Secondary | ICD-10-CM

## 2024-07-10 DIAGNOSIS — D252 Subserosal leiomyoma of uterus: Secondary | ICD-10-CM

## 2024-07-10 DIAGNOSIS — Z01419 Encounter for gynecological examination (general) (routine) without abnormal findings: Secondary | ICD-10-CM

## 2024-07-10 NOTE — Assessment & Plan Note (Addendum)
 She has a 1/2 sister with breast cancer at 66.  High risk of breast cancer of 20.3%.  Negative genetic testing in 2022.  Completed genetic counseling referral, annual breast MRI's at 26. Will start annual mammograms at 31 Normal breast exam today

## 2024-07-10 NOTE — Patient Instructions (Signed)

## 2024-07-10 NOTE — Progress Notes (Signed)
 "  29 y.o. G0P0000 female with PCOS, HS (followed by Derm, Dr. Constantino), stage IV endometriosis (followed at [facility], s/p RA-RSO, myomectomy with Gyn ONC in 2022), AUB-F (on Aygestin ), high risk of breast cancer (20%, s/p genetic counseling 2022, negative BRCA testing, recommended mammograms at 31 and breast MRI's at 26), morbid obesity, prediabetes here for breast follow-up. Single.  Works psychologist, sport and exercise at [employer]. Recently started back to school at [employer]. Pcp: Plotnikov, Karlynn [name], MD    No LMP recorded. (Menstrual status: Oral contraceptives).   Menarche at 11 years.   She reports no concerns, doing well.  Endo and fibroids: Had recent follow-up with [facility], seeing Dr. Lucio at [facility] for endo and fibroids.   Stable transvaginal ultrasound with decreasing fibroid bulk, 12/2023.   No cycle with aygestin   HS: Started cosentyx  last year with derm, she is still using topical clindamycin . Her flares have significantly improved however she is still experiencing some open wounds, especially of axilla. She has a f/u appt with derm next week.  Started GLP1 in October, down 10lb since last year!  AUB: none Breast pain or nipple discharge: none Pelvic pain or vaginal discharge: none  Sexually active: No  Birth control: Abstinence  Gardasil: complete     Component Value Date/Time   DIAGPAP  07/09/2023 1549    - Negative for intraepithelial lesion or malignancy (NILM)   DIAGPAP  05/19/2021 1432    - Negative for intraepithelial lesion or malignancy (NILM)   HPVHIGH Negative 07/09/2023 1549   ADEQPAP  07/09/2023 1549    Satisfactory for evaluation; transformation zone component PRESENT.   ADEQPAP  05/19/2021 1432    Satisfactory for evaluation; transformation zone component PRESENT.    07/08/23 Breast MRI: BIRADS 1, density a 07/13/23: BI-RADS CATEGORY 3: Probably benign. F/u in 3 months. 01/18/24 US   BIRADS 2  Exercising: Yes, walking twice a week  Smoker: No   Flowsheet Row  Office Visit from 07/10/2024 in [facility]  PHQ-2 Total Score 0   Flowsheet Row Office Visit from 01/31/2024 in [facility] [place]  PHQ-9 Total Score 1   GYN HISTORY: PCOS HS 12/2020: Stage IV endometriosis (Ra-RSO and RSO and myomectomy with Dr. Viktoria (Gyn Onc))  High risk of breast cancer (20%, s/p genetic counseling 2022, negative BRCA testing)  OB History  Gravida Para Term Preterm AB Living  0 0 0 0 0 0  SAB IAB Ectopic Multiple Live Births  0 0 0 0 0    Past Medical History:  Diagnosis Date   Anemia    Anxiety    Chest pain    Constipation    Cyst of right ovary 12/14/2020   Depression    Endometriosis of pelvis    Family history of breast cancer    Family history of colon cancer    Family history of ovarian cancer    Family history of prostate cancer    Hidradenitis suppurativa    PCOS (polycystic ovarian syndrome)    Prediabetes    Sleep apnea    Vitamin D  deficiency     Past Surgical History:  Procedure Laterality Date   CERVICAL POLYPECTOMY     DILATATION & CURETTAGE/HYSTEROSCOPY WITH MYOSURE N/A 11/21/2016   Procedure: DILATATION & CURETTAGE/HYSTEROSCOPY WITH MYOSURE;  Surgeon: Rutherford Gain, MD;  Location: [facility];  Service: Gynecology;  [name];   ROBOTIC ASSISTED LAPAROSCOPIC OVARIAN CYSTECTOMY N/A 12/30/2020   Procedure: XI ROBOTIC ASSISTED  LAPAROSCOPIC RIGHT SALPINGO-OOPHORECTOMY WITH PELVIC WASHINGS;  Surgeon: Viktoria Comer SAUNDERS, MD;  [name];  Service: Gynecology;  [name];   WISDOM TOOTH EXTRACTION      Current Outpatient Medications on File Prior to Visit  Medication Sig Dispense Refill   cariprazine  (VRAYLAR ) 3 MG capsule Take 1 capsule (3 mg total) by mouth daily. 30 capsule 2   clindamycin  (CLEOCIN  T) 1 % external solution Apply 1 Application topically 1 (one) - 2 (two) times daily. 30 mL 5   cyanocobalamin  (VITAMIN B12) 1000 MCG/ML injection Inject 1  mL (1,000 mcg total) into the muscle every 30 (thirty) days. 3 mL 3   hydrOXYzine  (VISTARIL ) 25 MG capsule Take 1 capsule (25 mg total) by mouth every evening. 30 capsule 2   modafinil  (PROVIGIL ) 200 MG tablet Take 1 tablet (200 mg total) by mouth in the morning. 30 tablet 2   norethindrone  (AYGESTIN ) 5 MG tablet Take 3 tablets (15 mg total) by mouth daily. 270 tablet 3   Secukinumab  (COSENTYX  UNOREADY) 300 MG/2ML SOAJ Inject under skin as directed every 4 weeks. 2 mL 1   tirzepatide  7.5 MG/0.5ML injection vial Inject 7.5 mg into the skin once a week. 2 mL 5   VITAMIN D  PO Take by mouth.     vortioxetine  HBr (TRINTELLIX ) 20 MG TABS tablet Take 1 tablet (20 mg total) by mouth daily. 30 tablet 2   No current facility-administered medications on file prior to visit.    Allergies  Allergen Reactions   Doxycycline      Upset stomach   Sulfamethoxazole-Trimethoprim     upset stomach    PE Today's Vitals   07/10/24 1131  BP: 98/64  Pulse: 94  Temp: 98.1 F (36.7 C)  TempSrc: Oral  SpO2: 98%  Weight: 265 lb (120.2 kg)  Height: 5' 7 (1.702 m)   Body mass index is 41.5 kg/m.  Physical Exam Vitals reviewed. Exam conducted with a chaperone present.  Constitutional:      General: She is not in acute distress.    Appearance: Normal appearance.  HENT:     Head: Normocephalic and atraumatic.     Nose: Nose normal.  Eyes:     Extraocular Movements: Extraocular movements intact.     Conjunctiva/sclera: Conjunctivae normal.  Pulmonary:     Effort: Pulmonary effort is normal.  Chest:     Chest wall: No mass or tenderness.  Breasts:    Right: Normal. No swelling, mass, nipple discharge, skin change or tenderness.     Left: Normal. No swelling, mass, nipple discharge, skin change or tenderness.  Abdominal:     General: There is no distension.     Palpations: Abdomen is soft.     Tenderness: There is no abdominal tenderness.  Genitourinary:    General: Normal vulva.     Exam  position: Lithotomy position.     Urethra: No prolapse.     Vagina: Normal. No vaginal discharge or bleeding.     Cervix: Normal. No lesion.     Uterus: Normal. Not enlarged and not tender.      Adnexa: Right adnexa normal and left adnexa normal.  Musculoskeletal:        General: Normal range of motion.     Cervical back: Normal range of motion.  Lymphadenopathy:     Upper Body:     Right upper body: No axillary adenopathy.     Left upper body: No axillary adenopathy.     Lower Body: No  right inguinal adenopathy. No left inguinal adenopathy.  Skin:    General: Skin is warm and dry.     Comments: Mild HS ulcerations of axilla and mons, no erythema, minimal drainage, minimal swelling  Neurological:     General: No focal deficit present.     Mental Status: She is alert.  Psychiatric:        Mood and Affect: Mood normal.        Behavior: Behavior normal.      Assessment and Plan:        Well woman exam with routine gynecological exam Assessment & Plan: Cervical cancer screening performed according to ASCCP guidelines. Encouraged annual breast MR screening at age 43, MMG at age 54 Labs and immunizations with her primary Encouraged safe sexual practices as indicated Encouraged healthy lifestyle practices with diet and exercise For patients under 50yo, I recommend 1000mg  calcium daily and 600IU of vitamin D  daily.    At high risk for breast cancer Assessment & Plan: She has a 1/2 sister with breast cancer at 58.  High risk of breast cancer of 20.3%.  Negative genetic testing in 2022.  Completed genetic counseling referral, annual breast MRI's at 26. Will start annual mammograms at 31 Normal breast exam today   PCOS (polycystic ovarian syndrome) Assessment & Plan: A1c (5.9) and lipids improved with PCP this year Normal blood pressure today Encouraged healthy diet and exercise.  Continue seeing PCP for GLP-1 management, down 10 lb   Hidradenitis Assessment &  Plan: Patient on cosentyx , clindamycin  by derm for HS However aygestin  and GLP1 should also provide symptoms relief Patient does have active lesions on exam today but no evidence of infection Continue to follow with derm   Endometriosis Assessment & Plan: S/p RA-RSO and myomectomy with Dr Viktoria July 2020. She was noted to have stage IV endometriosis as well. On aygestin  Continue management with [facility]   Subserous leiomyoma of uterus Assessment & Plan: Continue management with [facility]   Negative depression screening  Vera LULLA Pa, MD  "

## 2024-07-10 NOTE — Assessment & Plan Note (Signed)
 Cervical cancer screening performed according to ASCCP guidelines. Encouraged annual breast MR screening at age 29, MMG at age 59 Labs and immunizations with her primary Encouraged safe sexual practices as indicated Encouraged healthy lifestyle practices with diet and exercise For patients under 50yo, I recommend 1000mg  calcium daily and 600IU of vitamin D daily.

## 2024-07-10 NOTE — Assessment & Plan Note (Addendum)
 A1c (5.9) and lipids improved with PCP this year Normal blood pressure today Encouraged healthy diet and exercise.  Continue seeing PCP for GLP-1 management, down 10 lb

## 2024-07-10 NOTE — Assessment & Plan Note (Signed)
 Continue management with [facility]

## 2024-07-10 NOTE — Assessment & Plan Note (Signed)
 Patient on cosentyx , clindamycin  by derm for HS However aygestin  and GLP1 should also provide symptoms relief Patient does have active lesions on exam today but no evidence of infection Continue to follow with derm

## 2024-07-10 NOTE — Assessment & Plan Note (Addendum)
 S/p RA-RSO and myomectomy with Dr Viktoria July 2020. She was noted to have stage IV endometriosis as well. On aygestin  Continue management with [facility]

## 2024-07-12 ENCOUNTER — Ambulatory Visit
Admission: RE | Admit: 2024-07-12 | Discharge: 2024-07-12 | Disposition: A | Discharge status: Home / Self Care | Source: Ambulatory Visit | Attending: Obstetrics and Gynecology | Admitting: Obstetrics and Gynecology

## 2024-07-12 DIAGNOSIS — Z9189 Other specified personal risk factors, not elsewhere classified: Secondary | ICD-10-CM

## 2024-07-12 MED ORDER — GADOPICLENOL 0.5 MMOL/ML IV SOLN
10.0000 mL | Freq: Once | INTRAVENOUS | Status: AC | PRN
  Administered 2024-07-12: 10 mL via INTRAVENOUS

## 2024-07-14 ENCOUNTER — Other Ambulatory Visit: Payer: Self-pay

## 2024-07-15 ENCOUNTER — Ambulatory Visit: Payer: Self-pay | Admitting: Obstetrics and Gynecology

## 2024-07-15 ENCOUNTER — Other Ambulatory Visit: Payer: Self-pay

## 2024-07-15 ENCOUNTER — Ambulatory Visit: Admitting: Internal Medicine

## 2024-07-15 ENCOUNTER — Other Ambulatory Visit: Payer: Self-pay | Admitting: Pharmacist

## 2024-07-15 ENCOUNTER — Other Ambulatory Visit (HOSPITAL_COMMUNITY): Payer: Self-pay

## 2024-07-15 MED ORDER — COSENTYX UNOREADY 300 MG/2ML ~~LOC~~ SOAJ: SUBCUTANEOUS | 0 refills | Status: DC

## 2024-07-15 MED ORDER — COSENTYX UNOREADY 300 MG/2ML ~~LOC~~ SOAJ
SUBCUTANEOUS | 0 refills | Status: AC
  Filled 2024-07-15: qty 2, fill #0
  Filled 2024-07-16: qty 2, 28d supply, fill #0

## 2024-07-16 ENCOUNTER — Other Ambulatory Visit: Payer: Self-pay

## 2024-07-17 ENCOUNTER — Other Ambulatory Visit: Payer: Self-pay

## 2024-07-17 ENCOUNTER — Other Ambulatory Visit: Payer: Self-pay | Admitting: Pharmacist

## 2024-07-17 MED ORDER — COSENTYX UNOREADY 300 MG/2ML ~~LOC~~ SOAJ
SUBCUTANEOUS | 0 refills | Status: AC
  Filled 2024-07-17: qty 2, 28d supply, fill #0

## 2024-07-17 MED ORDER — COSENTYX UNOREADY 300 MG/2ML ~~LOC~~ SOAJ: SUBCUTANEOUS | 0 refills | Status: DC

## 2024-07-17 NOTE — Progress Notes (Signed)
 Specialty Pharmacy Refill Coordination Note  Kendra Allen is a 29 y.o. female contacted today regarding refills of specialty medication(s) Secukinumab  (Cosentyx  UnoReady)   Patient requested Delivery   Delivery date: 07/23/24   Verified address: [address] [place] [address]   Medication will be filled on: 07/22/24

## 2024-07-18 ENCOUNTER — Other Ambulatory Visit (HOSPITAL_COMMUNITY): Payer: Self-pay

## 2024-07-22 ENCOUNTER — Other Ambulatory Visit: Payer: Self-pay

## 2024-07-22 ENCOUNTER — Ambulatory Visit: Admitting: Psychology

## 2024-07-22 DIAGNOSIS — F331 Major depressive disorder, recurrent, moderate: Secondary | ICD-10-CM | POA: Diagnosis not present

## 2024-08-11 ENCOUNTER — Ambulatory Visit: Admitting: Internal Medicine

## 2024-08-19 ENCOUNTER — Ambulatory Visit: Admitting: Psychology

## 2025-07-16 ENCOUNTER — Ambulatory Visit: Admitting: Obstetrics and Gynecology
